# Patient Record
Sex: Male | Born: 1950 | Race: White | Hispanic: No | Marital: Single | Smoking: Former smoker
Health system: Southern US, Community
[De-identification: ages and names within clinical notes are randomized; demographics above are authoritative.]

## PROBLEM LIST (undated history)

## (undated) DIAGNOSIS — Z9889 Other specified postprocedural states: Secondary | ICD-10-CM

## (undated) DIAGNOSIS — T7840XA Allergy, unspecified, initial encounter: Secondary | ICD-10-CM

## (undated) DIAGNOSIS — Z21 Asymptomatic human immunodeficiency virus [HIV] infection status: Secondary | ICD-10-CM

## (undated) DIAGNOSIS — E785 Hyperlipidemia, unspecified: Secondary | ICD-10-CM

## (undated) DIAGNOSIS — C801 Malignant (primary) neoplasm, unspecified: Secondary | ICD-10-CM

## (undated) DIAGNOSIS — M1990 Unspecified osteoarthritis, unspecified site: Secondary | ICD-10-CM

## (undated) DIAGNOSIS — Z87891 Personal history of nicotine dependence: Secondary | ICD-10-CM

## (undated) DIAGNOSIS — B20 Human immunodeficiency virus [HIV] disease: Secondary | ICD-10-CM

## (undated) DIAGNOSIS — Z8719 Personal history of other diseases of the digestive system: Secondary | ICD-10-CM

## (undated) DIAGNOSIS — H53039 Strabismic amblyopia, unspecified eye: Secondary | ICD-10-CM

## (undated) DIAGNOSIS — I10 Essential (primary) hypertension: Secondary | ICD-10-CM

## (undated) HISTORY — DX: Other specified postprocedural states: Z98.890

## (undated) HISTORY — DX: Human immunodeficiency virus (HIV) disease: B20

## (undated) HISTORY — DX: Allergy, unspecified, initial encounter: T78.40XA

## (undated) HISTORY — PX: HERNIA REPAIR: SHX51

## (undated) HISTORY — DX: Personal history of nicotine dependence: Z87.891

## (undated) HISTORY — DX: Personal history of other diseases of the digestive system: Z87.19

## (undated) HISTORY — PX: EYE SURGERY: SHX253

## (undated) HISTORY — DX: Unspecified osteoarthritis, unspecified site: M19.90

## (undated) HISTORY — DX: Asymptomatic human immunodeficiency virus (hiv) infection status: Z21

## (undated) HISTORY — DX: Strabismic amblyopia, unspecified eye: H53.039

## (undated) HISTORY — DX: Essential (primary) hypertension: I10

## (undated) HISTORY — DX: Hyperlipidemia, unspecified: E78.5

---

## 2001-04-26 DIAGNOSIS — B20 Human immunodeficiency virus [HIV] disease: Secondary | ICD-10-CM

## 2003-08-04 ENCOUNTER — Encounter (INDEPENDENT_AMBULATORY_CARE_PROVIDER_SITE_OTHER): Payer: Self-pay | Admitting: *Deleted

## 2003-08-04 ENCOUNTER — Encounter: Admission: RE | Admit: 2003-08-04 | Discharge: 2003-08-04 | Payer: Self-pay | Admitting: Internal Medicine

## 2003-08-04 ENCOUNTER — Ambulatory Visit (HOSPITAL_COMMUNITY): Admission: RE | Admit: 2003-08-04 | Discharge: 2003-08-04 | Payer: Self-pay | Admitting: Internal Medicine

## 2003-08-04 LAB — CONVERTED CEMR LAB
CD4 Count: 530 microliters
CD4 T Cell Abs: 530

## 2003-08-18 ENCOUNTER — Encounter: Admission: RE | Admit: 2003-08-18 | Discharge: 2003-08-18 | Payer: Self-pay | Admitting: Infectious Diseases

## 2003-09-02 ENCOUNTER — Encounter: Admission: RE | Admit: 2003-09-02 | Discharge: 2003-09-02 | Payer: Self-pay | Admitting: Infectious Diseases

## 2003-09-02 ENCOUNTER — Ambulatory Visit (HOSPITAL_COMMUNITY): Admission: RE | Admit: 2003-09-02 | Discharge: 2003-09-02 | Payer: Self-pay | Admitting: Infectious Diseases

## 2003-09-16 ENCOUNTER — Encounter: Admission: RE | Admit: 2003-09-16 | Discharge: 2003-09-16 | Payer: Self-pay | Admitting: Infectious Diseases

## 2003-12-23 ENCOUNTER — Encounter: Admission: RE | Admit: 2003-12-23 | Discharge: 2003-12-23 | Payer: Self-pay | Admitting: Infectious Diseases

## 2003-12-23 ENCOUNTER — Ambulatory Visit (HOSPITAL_COMMUNITY): Admission: RE | Admit: 2003-12-23 | Discharge: 2003-12-23 | Payer: Self-pay | Admitting: Infectious Diseases

## 2004-01-06 ENCOUNTER — Encounter: Admission: RE | Admit: 2004-01-06 | Discharge: 2004-01-06 | Payer: Self-pay | Admitting: Infectious Diseases

## 2004-04-25 ENCOUNTER — Ambulatory Visit: Payer: Self-pay | Admitting: Infectious Diseases

## 2004-04-25 ENCOUNTER — Ambulatory Visit (HOSPITAL_COMMUNITY): Admission: RE | Admit: 2004-04-25 | Discharge: 2004-04-25 | Payer: Self-pay | Admitting: Infectious Diseases

## 2004-05-09 ENCOUNTER — Ambulatory Visit: Payer: Self-pay | Admitting: Infectious Diseases

## 2004-09-07 ENCOUNTER — Ambulatory Visit: Payer: Self-pay | Admitting: Infectious Diseases

## 2004-09-07 ENCOUNTER — Ambulatory Visit (HOSPITAL_COMMUNITY): Admission: RE | Admit: 2004-09-07 | Discharge: 2004-09-07 | Payer: Self-pay | Admitting: Infectious Diseases

## 2004-09-21 ENCOUNTER — Ambulatory Visit: Payer: Self-pay | Admitting: Infectious Diseases

## 2005-02-06 ENCOUNTER — Ambulatory Visit: Payer: Self-pay | Admitting: Infectious Diseases

## 2005-02-06 ENCOUNTER — Ambulatory Visit (HOSPITAL_COMMUNITY): Admission: RE | Admit: 2005-02-06 | Discharge: 2005-02-06 | Payer: Self-pay | Admitting: Infectious Diseases

## 2005-03-01 ENCOUNTER — Ambulatory Visit: Payer: Self-pay | Admitting: Infectious Diseases

## 2005-07-19 ENCOUNTER — Ambulatory Visit: Payer: Self-pay | Admitting: Infectious Diseases

## 2005-07-19 ENCOUNTER — Encounter (INDEPENDENT_AMBULATORY_CARE_PROVIDER_SITE_OTHER): Payer: Self-pay | Admitting: *Deleted

## 2005-07-19 LAB — CONVERTED CEMR LAB: CD4 Count: 650 microliters

## 2005-08-02 ENCOUNTER — Ambulatory Visit: Payer: Self-pay | Admitting: Infectious Diseases

## 2006-01-03 ENCOUNTER — Ambulatory Visit: Payer: Self-pay | Admitting: Infectious Diseases

## 2006-01-03 ENCOUNTER — Encounter: Admission: RE | Admit: 2006-01-03 | Discharge: 2006-01-03 | Payer: Self-pay | Admitting: Infectious Diseases

## 2006-01-03 ENCOUNTER — Encounter (INDEPENDENT_AMBULATORY_CARE_PROVIDER_SITE_OTHER): Payer: Self-pay | Admitting: *Deleted

## 2006-01-03 LAB — CONVERTED CEMR LAB
CD4 Count: 620 microliters
HIV 1 RNA Quant: 399 copies/mL

## 2006-01-17 ENCOUNTER — Ambulatory Visit: Payer: Self-pay | Admitting: Infectious Diseases

## 2006-07-03 ENCOUNTER — Encounter: Admission: RE | Admit: 2006-07-03 | Discharge: 2006-07-03 | Payer: Self-pay | Admitting: Infectious Diseases

## 2006-07-03 ENCOUNTER — Ambulatory Visit: Payer: Self-pay | Admitting: Infectious Diseases

## 2006-07-03 ENCOUNTER — Encounter (INDEPENDENT_AMBULATORY_CARE_PROVIDER_SITE_OTHER): Payer: Self-pay | Admitting: *Deleted

## 2006-07-03 LAB — CONVERTED CEMR LAB
ALT: 21 units/L (ref 0–53)
AST: 19 units/L (ref 0–37)
Albumin: 4.6 g/dL (ref 3.5–5.2)
Alkaline Phosphatase: 82 units/L (ref 39–117)
Basophils Absolute: 0 10*3/uL (ref 0.0–0.1)
Eosinophils Relative: 4 % (ref 0–5)
Glucose, Bld: 102 mg/dL — ABNORMAL HIGH (ref 70–99)
HCT: 45.6 % (ref 39.0–52.0)
HIV 1 RNA Quant: 136 copies/mL — ABNORMAL HIGH (ref <50)
HIV-1 RNA Quant, Log: 2.13 — ABNORMAL HIGH (ref <1.70)
LDL Cholesterol: 89 mg/dL (ref 0–99)
Lymphocytes Relative: 34 % (ref 12–46)
MCHC: 33.3 g/dL (ref 30.0–36.0)
Platelets: 192 10*3/uL (ref 150–400)
Potassium: 4.4 meq/L (ref 3.5–5.3)
RDW: 13.4 % (ref 11.5–14.0)
Sodium: 141 meq/L (ref 135–145)
Total Bilirubin: 0.5 mg/dL (ref 0.3–1.2)
Total Protein: 6.9 g/dL (ref 6.0–8.3)
Triglycerides: 377 mg/dL — ABNORMAL HIGH (ref <150)
VLDL: 75 mg/dL — ABNORMAL HIGH (ref 0–40)

## 2006-07-18 ENCOUNTER — Ambulatory Visit: Payer: Self-pay | Admitting: Infectious Diseases

## 2006-07-18 DIAGNOSIS — E785 Hyperlipidemia, unspecified: Secondary | ICD-10-CM

## 2006-07-18 DIAGNOSIS — H53039 Strabismic amblyopia, unspecified eye: Secondary | ICD-10-CM | POA: Insufficient documentation

## 2006-07-18 DIAGNOSIS — Z87898 Personal history of other specified conditions: Secondary | ICD-10-CM | POA: Insufficient documentation

## 2006-08-20 ENCOUNTER — Encounter (INDEPENDENT_AMBULATORY_CARE_PROVIDER_SITE_OTHER): Payer: Self-pay | Admitting: *Deleted

## 2006-08-20 LAB — CONVERTED CEMR LAB

## 2006-08-29 ENCOUNTER — Encounter (INDEPENDENT_AMBULATORY_CARE_PROVIDER_SITE_OTHER): Payer: Self-pay | Admitting: *Deleted

## 2006-09-02 ENCOUNTER — Encounter (INDEPENDENT_AMBULATORY_CARE_PROVIDER_SITE_OTHER): Payer: Self-pay | Admitting: *Deleted

## 2006-12-17 ENCOUNTER — Encounter: Admission: RE | Admit: 2006-12-17 | Discharge: 2006-12-17 | Payer: Self-pay | Admitting: Infectious Diseases

## 2006-12-17 ENCOUNTER — Ambulatory Visit: Payer: Self-pay | Admitting: Infectious Diseases

## 2006-12-17 LAB — CONVERTED CEMR LAB
ALT: 23 units/L (ref 0–35)
Albumin: 4.7 g/dL (ref 3.5–5.2)
Alkaline Phosphatase: 94 units/L (ref 39–117)
Basophils Absolute: 0 10*3/uL (ref 0.0–0.1)
CO2: 27 meq/L (ref 19–32)
Eosinophils Absolute: 0.1 10*3/uL (ref 0.0–0.7)
Eosinophils Relative: 3 % (ref 0–5)
Glucose, Bld: 107 mg/dL — ABNORMAL HIGH (ref 70–99)
HIV-1 RNA Quant, Log: 2.69 — ABNORMAL HIGH (ref <1.70)
Lymphocytes Relative: 34 % (ref 12–46)
Lymphs Abs: 1.6 10*3/uL (ref 0.7–3.3)
Neutrophils Relative %: 54 % (ref 43–77)
Platelets: 188 10*3/uL (ref 150–400)
Potassium: 4.3 meq/L (ref 3.5–5.3)
RDW: 13.1 % (ref 11.5–14.0)
Sodium: 140 meq/L (ref 135–145)
Total Bilirubin: 0.4 mg/dL (ref 0.3–1.2)
Total Protein: 7 g/dL (ref 6.0–8.3)
WBC: 4.7 10*3/uL (ref 4.0–10.5)

## 2006-12-18 ENCOUNTER — Encounter: Payer: Self-pay | Admitting: Infectious Diseases

## 2006-12-31 ENCOUNTER — Ambulatory Visit: Payer: Self-pay | Admitting: Infectious Diseases

## 2006-12-31 DIAGNOSIS — I10 Essential (primary) hypertension: Secondary | ICD-10-CM

## 2007-01-01 ENCOUNTER — Encounter (INDEPENDENT_AMBULATORY_CARE_PROVIDER_SITE_OTHER): Payer: Self-pay | Admitting: *Deleted

## 2007-01-11 ENCOUNTER — Encounter (INDEPENDENT_AMBULATORY_CARE_PROVIDER_SITE_OTHER): Payer: Self-pay | Admitting: *Deleted

## 2007-03-20 ENCOUNTER — Telehealth: Payer: Self-pay | Admitting: Infectious Diseases

## 2007-04-16 ENCOUNTER — Ambulatory Visit: Payer: Self-pay | Admitting: Infectious Diseases

## 2007-04-16 ENCOUNTER — Encounter: Admission: RE | Admit: 2007-04-16 | Discharge: 2007-04-16 | Payer: Self-pay | Admitting: Infectious Diseases

## 2007-04-16 LAB — CONVERTED CEMR LAB
ALT: 18 units/L (ref 0–53)
AST: 17 units/L (ref 0–37)
Alkaline Phosphatase: 82 units/L (ref 39–117)
BUN: 11 mg/dL (ref 6–23)
Basophils Absolute: 0 10*3/uL (ref 0.0–0.1)
Basophils Relative: 1 % (ref 0–1)
Calcium: 9.1 mg/dL (ref 8.4–10.5)
Creatinine, Ser: 0.88 mg/dL (ref 0.40–1.50)
Eosinophils Absolute: 0.2 10*3/uL (ref 0.0–0.7)
Eosinophils Relative: 3 % (ref 0–5)
Hemoglobin: 15.4 g/dL (ref 13.0–17.0)
MCHC: 34.8 g/dL (ref 30.0–36.0)
MCV: 90 fL (ref 78.0–100.0)
Monocytes Absolute: 0.6 10*3/uL (ref 0.2–0.7)
Monocytes Relative: 11 % (ref 3–11)
RBC: 4.92 M/uL (ref 4.22–5.81)
RDW: 13.3 % (ref 11.5–14.0)
Total Bilirubin: 0.5 mg/dL (ref 0.3–1.2)

## 2007-05-01 ENCOUNTER — Ambulatory Visit: Payer: Self-pay | Admitting: Infectious Diseases

## 2007-05-01 LAB — CONVERTED CEMR LAB
Cholesterol: 183 mg/dL (ref 0–200)
HDL: 33 mg/dL — ABNORMAL LOW (ref >39)
Total CHOL/HDL Ratio: 5.5
Triglycerides: 502 mg/dL — ABNORMAL HIGH (ref <150)

## 2007-06-24 ENCOUNTER — Encounter (INDEPENDENT_AMBULATORY_CARE_PROVIDER_SITE_OTHER): Payer: Self-pay | Admitting: *Deleted

## 2007-06-25 ENCOUNTER — Encounter (INDEPENDENT_AMBULATORY_CARE_PROVIDER_SITE_OTHER): Payer: Self-pay | Admitting: *Deleted

## 2007-06-25 ENCOUNTER — Encounter: Payer: Self-pay | Admitting: Infectious Diseases

## 2007-08-29 ENCOUNTER — Encounter (INDEPENDENT_AMBULATORY_CARE_PROVIDER_SITE_OTHER): Payer: Self-pay | Admitting: *Deleted

## 2007-09-13 ENCOUNTER — Encounter: Payer: Self-pay | Admitting: Infectious Diseases

## 2007-09-16 ENCOUNTER — Encounter: Admission: RE | Admit: 2007-09-16 | Discharge: 2007-09-16 | Payer: Self-pay | Admitting: Infectious Diseases

## 2007-09-16 ENCOUNTER — Ambulatory Visit: Payer: Self-pay | Admitting: Infectious Diseases

## 2007-09-16 LAB — CONVERTED CEMR LAB
AST: 21 units/L (ref 0–37)
Alkaline Phosphatase: 83 units/L (ref 39–117)
BUN: 11 mg/dL (ref 6–23)
Basophils Absolute: 0 10*3/uL (ref 0.0–0.1)
Basophils Relative: 0 % (ref 0–1)
Creatinine, Ser: 0.88 mg/dL (ref 0.40–1.50)
Eosinophils Absolute: 0.2 10*3/uL (ref 0.0–0.7)
HDL: 37 mg/dL — ABNORMAL LOW (ref >39)
LDL Cholesterol: 93 mg/dL (ref 0–99)
MCHC: 34.2 g/dL (ref 30.0–36.0)
MCV: 91.1 fL (ref 78.0–100.0)
Monocytes Relative: 10 % (ref 3–12)
Neutrophils Relative %: 48 % (ref 43–77)
Potassium: 4.3 meq/L (ref 3.5–5.3)
RBC: 4.85 M/uL (ref 4.22–5.81)
RDW: 13 % (ref 11.5–15.5)
Total CHOL/HDL Ratio: 5.1

## 2007-09-26 ENCOUNTER — Encounter (INDEPENDENT_AMBULATORY_CARE_PROVIDER_SITE_OTHER): Payer: Self-pay | Admitting: *Deleted

## 2007-09-30 ENCOUNTER — Ambulatory Visit: Payer: Self-pay | Admitting: Infectious Diseases

## 2008-03-16 ENCOUNTER — Ambulatory Visit: Payer: Self-pay | Admitting: Infectious Diseases

## 2008-03-16 LAB — CONVERTED CEMR LAB
ALT: 20 units/L (ref 0–53)
AST: 22 units/L (ref 0–37)
Cholesterol: 186 mg/dL (ref 0–200)
Creatinine, Ser: 0.82 mg/dL (ref 0.40–1.50)
HCT: 46.4 % (ref 39.0–52.0)
HIV 1 RNA Quant: 151 copies/mL — ABNORMAL HIGH (ref <50)
HIV-1 RNA Quant, Log: 2.18 — ABNORMAL HIGH (ref <1.70)
MCHC: 33.6 g/dL (ref 30.0–36.0)
MCV: 90.4 fL (ref 78.0–100.0)
Platelets: 198 10*3/uL (ref 150–400)
RDW: 12.7 % (ref 11.5–15.5)
Total Bilirubin: 0.5 mg/dL (ref 0.3–1.2)
Total CHOL/HDL Ratio: 5
VLDL: 52 mg/dL — ABNORMAL HIGH (ref 0–40)

## 2008-03-30 ENCOUNTER — Ambulatory Visit: Payer: Self-pay | Admitting: Infectious Diseases

## 2008-08-21 ENCOUNTER — Encounter (INDEPENDENT_AMBULATORY_CARE_PROVIDER_SITE_OTHER): Payer: Self-pay | Admitting: *Deleted

## 2008-09-16 ENCOUNTER — Ambulatory Visit: Payer: Self-pay | Admitting: Infectious Diseases

## 2008-09-16 LAB — CONVERTED CEMR LAB
AST: 19 units/L (ref 0–37)
Albumin: 4.5 g/dL (ref 3.5–5.2)
Alkaline Phosphatase: 81 units/L (ref 39–117)
BUN: 11 mg/dL (ref 6–23)
Basophils Absolute: 0 10*3/uL (ref 0.0–0.1)
Basophils Relative: 0 % (ref 0–1)
Creatinine, Ser: 0.83 mg/dL (ref 0.40–1.50)
Eosinophils Relative: 2 % (ref 0–5)
HCT: 42.7 % (ref 39.0–52.0)
HDL: 35 mg/dL — ABNORMAL LOW (ref >39)
LDL Cholesterol: 75 mg/dL (ref 0–99)
Lymphocytes Relative: 30 % (ref 12–46)
Platelets: 191 10*3/uL (ref 150–400)
Potassium: 4.1 meq/L (ref 3.5–5.3)
RDW: 13 % (ref 11.5–15.5)
Total Bilirubin: 0.4 mg/dL (ref 0.3–1.2)
Total CHOL/HDL Ratio: 4.5
VLDL: 49 mg/dL — ABNORMAL HIGH (ref 0–40)

## 2008-09-30 ENCOUNTER — Ambulatory Visit: Payer: Self-pay | Admitting: Infectious Diseases

## 2008-10-28 ENCOUNTER — Encounter (INDEPENDENT_AMBULATORY_CARE_PROVIDER_SITE_OTHER): Payer: Self-pay | Admitting: *Deleted

## 2009-03-10 ENCOUNTER — Ambulatory Visit: Payer: Self-pay | Admitting: Infectious Diseases

## 2009-03-10 LAB — CONVERTED CEMR LAB
ALT: 21 units/L (ref 0–53)
Albumin: 4.3 g/dL (ref 3.5–5.2)
Basophils Relative: 1 % (ref 0–1)
CO2: 24 meq/L (ref 19–32)
Cholesterol: 161 mg/dL (ref 0–200)
Eosinophils Absolute: 0.1 10*3/uL (ref 0.0–0.7)
Glucose, Bld: 94 mg/dL (ref 70–99)
HIV 1 RNA Quant: 62 copies/mL — ABNORMAL HIGH (ref <48)
HIV-1 RNA Quant, Log: 1.79 — ABNORMAL HIGH (ref <1.68)
Lymphs Abs: 1.7 10*3/uL (ref 0.7–4.0)
MCHC: 34.2 g/dL (ref 30.0–36.0)
MCV: 91 fL (ref >=78.0)
Neutro Abs: 2.9 10*3/uL (ref 1.7–7.7)
Neutrophils Relative %: 56 % (ref 43–77)
Platelets: 193 10*3/uL (ref 150–400)
Potassium: 4.3 meq/L (ref 3.5–5.3)
RBC: 4.79 M/uL (ref 4.22–5.81)
Sodium: 138 meq/L (ref 135–145)
Total Bilirubin: 0.4 mg/dL (ref 0.3–1.2)
Total Protein: 6.4 g/dL (ref 6.0–8.3)
WBC: 5.2 10*3/uL (ref 4.0–10.5)

## 2009-03-24 ENCOUNTER — Ambulatory Visit: Payer: Self-pay | Admitting: Infectious Diseases

## 2009-08-19 ENCOUNTER — Encounter (INDEPENDENT_AMBULATORY_CARE_PROVIDER_SITE_OTHER): Payer: Self-pay | Admitting: *Deleted

## 2009-09-08 ENCOUNTER — Ambulatory Visit: Payer: Self-pay | Admitting: Infectious Diseases

## 2009-09-08 LAB — CONVERTED CEMR LAB
Albumin: 4.5 g/dL (ref 3.5–5.2)
Alkaline Phosphatase: 73 units/L (ref 39–117)
CO2: 27 meq/L (ref 19–32)
Calcium: 9 mg/dL (ref 8.4–10.5)
Chloride: 104 meq/L (ref 96–112)
Cholesterol: 174 mg/dL (ref 0–200)
Glucose, Bld: 96 mg/dL (ref 70–99)
HCT: 44.3 % (ref 39.0–52.0)
HIV-1 RNA Quant, Log: 2.61 — ABNORMAL HIGH (ref <1.68)
Hemoglobin: 14.9 g/dL (ref 13.0–17.0)
LDL Cholesterol: 75 mg/dL (ref 0–99)
Lymphocytes Relative: 38 % (ref 12–46)
Lymphs Abs: 2.1 10*3/uL (ref 0.7–4.0)
Monocytes Absolute: 0.4 10*3/uL (ref 0.1–1.0)
Monocytes Relative: 7 % (ref 3–12)
Neutro Abs: 2.9 10*3/uL (ref 1.7–7.7)
Potassium: 4.3 meq/L (ref 3.5–5.3)
RBC: 4.91 M/uL (ref 4.22–5.81)
Sodium: 140 meq/L (ref 135–145)
Total Protein: 6.5 g/dL (ref 6.0–8.3)
Triglycerides: 329 mg/dL — ABNORMAL HIGH (ref <150)

## 2009-10-01 ENCOUNTER — Ambulatory Visit: Payer: Self-pay | Admitting: Infectious Diseases

## 2010-03-23 ENCOUNTER — Ambulatory Visit: Payer: Self-pay | Admitting: Infectious Diseases

## 2010-03-23 LAB — CONVERTED CEMR LAB
ALT: 29 U/L
AST: 28 U/L
Albumin: 4.3 g/dL
Alkaline Phosphatase: 76 U/L
BUN: 11 mg/dL
Basophils Absolute: 0 K/uL
Basophils Relative: 1 %
CO2: 25 meq/L
Calcium: 9 mg/dL
Chloride: 106 meq/L
Creatinine, Ser: 0.86 mg/dL
Eosinophils Absolute: 0.2 K/uL
Eosinophils Relative: 3 %
Glucose, Bld: 94 mg/dL
HCT: 43 %
HIV 1 RNA Quant: <20 copies/mL (ref <20)
HIV-1 RNA Quant, Log: <1.3 (ref <1.30)
Hemoglobin: 14.8 g/dL
Lymphocytes Relative: 32 %
Lymphs Abs: 1.9 K/uL
MCHC: 34.4 g/dL
MCV: 89.2 fL
Monocytes Absolute: 0.5 K/uL
Monocytes Relative: 9 %
Neutro Abs: 3.2 K/uL
Neutrophils Relative %: 55 %
Platelets: 201 K/uL
Potassium: 4.2 meq/L
RBC: 4.82 M/uL
RDW: 12.9 %
Sodium: 142 meq/L
Total Bilirubin: 0.3 mg/dL
Total Protein: 6.3 g/dL
WBC: 5.9 10*3/microliter

## 2010-04-06 ENCOUNTER — Ambulatory Visit: Payer: Self-pay | Admitting: Infectious Diseases

## 2010-04-22 ENCOUNTER — Telehealth (INDEPENDENT_AMBULATORY_CARE_PROVIDER_SITE_OTHER): Payer: Self-pay | Admitting: *Deleted

## 2010-07-26 NOTE — Assessment & Plan Note (Signed)
Summary: 61month f/u [mkj]   CC:  6 month follow up.  History of Present Illness: 60 yo M with HIV+ and hyperlipidemia. CD4 730-->650, VL <20  (03-23-2010). Prev Trig 329. no complaints. no problems with ART.   Preventive Screening-Counseling & Management  Alcohol-Tobacco     Alcohol drinks/day: 0     Smoking Status: never     Year Quit: 10 years ago  Caffeine-Diet-Exercise     Caffeine use/day: 3cups coffee am  2 pm/soda     Does Patient Exercise: not really  Safety-Violence-Falls     Seat Belt Use: yes   Updated Prior Medication List: ATRIPLA 600-200-300 MG TABS (EFAVIRENZ-EMTRICITAB-TENOFOVIR) one by mouth at bedtime  Current Allergies (reviewed today): No known allergies  Past History:  Past medical, surgical, family and social histories (including risk factors) reviewed, and no changes noted (except as noted below).  Past Medical History: Reviewed history from 07/18/2006 and no changes required. HIV disease 04/26/2001 Hyperlipidemia  Family History: Reviewed history from 09/30/2007 and no changes required. denies.   Social History: Reviewed history from 09/30/2007 and no changes required. Former Smoker- qut 10 years ago.  Alcohol use-yes- every day. 2-3 beers.   Review of Systems       wt down 5#,   Vital Signs:  Patient profile:   60 year old male Height:      70 inches (177.80 cm) Weight:      218.0 pounds (99.09 kg) BMI:     31.39 Temp:     98.6 degrees F (37.00 degrees C) oral Pulse rate:   73 / minute BP sitting:   143 / 95  (right arm)  Vitals Entered By: Baxter Hire) (April 06, 2010 10:23 AM) CC: 6 month follow up Pain Assessment Patient in pain? no      Nutritional Status BMI of > 30 = obese Nutritional Status Detail appetite is good per patient  Have you ever been in a relationship where you felt threatened, hurt or afraid?No   Does patient need assistance? Functional Status Self care Ambulation Normal   Physical  Exam  General:  well-developed, well-nourished, and well-hydrated.   Eyes:  pupils equal, pupils round, and pupils reactive to light.   Mouth:  pharynx pink and moist and no exudates.   Neck:  no masses.   Lungs:  normal respiratory effort and normal breath sounds.   Heart:  normal rate, regular rhythm, and no murmur.  normal rate, regular rhythm, and no murmur.   Abdomen:  soft, non-tender, and normal bowel sounds.  soft, non-tender, and normal bowel sounds.          Medication Adherence: 04/06/2010   Adherence to medications reviewed with patient. Counseling to provide adequate adherence provided   Prevention For Positives: 04/06/2010   Safe sex practices discussed with patient. Condoms offered.                             Impression & Recommendations:  Problem # 1:  HIV DISEASE (ICD-042) he is doing very well. will cont his currnet meds. offered condoms. flu shot today.states he has finnished his Hep A/B series.  offered colonoscopy, pt defers. return to clinic 5-6 months.   Problem # 2:  HYPERLIPIDEMIA (ICD-272.4) counseled to watch w and diet. exercise (encouraged)  Other Orders: Influenza Vaccine NON MCR (04540) Est. Patient Level IV (98119) Future Orders: T-CD4SP ([facility]) (CD4SP) ... 10/03/2010 T-HIV Viral Load [phone]) .Marland KitchenMarland Kitchen  10/03/2010 T-Comprehensive Metabolic Panel [phone]) ... 10/03/2010 T-CBC w/Diff ([id number]) ... 10/03/2010 T-RPR (Syphilis) [phone]) ... 10/03/2010 T-Lipid Profile [phone]) ... 10/03/2010    Influenza Vaccine    Vaccine Type: Fluvax Non-MCR    Site: right deltoid    Mfr: novartis    Dose: 0.5 ml    Route: IM    Given by: Jennet Maduro RN    Exp. Date: 09/25/2010    Lot #: [id number]  Flu Vaccine Consent Questions    Do you have a history of severe allergic reactions to this vaccine? no    Any prior history of allergic reactions to egg and/or gelatin? no    Do you have a sensitivity to the preservative  Thimersol? no    Do you have a past history of Guillan-Barre Syndrome? no    Do you currently have an acute febrile illness? no    Have you ever had a severe reaction to latex? no    Vaccine information given and explained to patient? yes

## 2010-07-26 NOTE — Assessment & Plan Note (Signed)
Summary: f/u/cfb   CC:  follow-up visit.  History of Present Illness: 60 yo M with HIV+ and hyperlipidemia. CD4 730, VL 411  (09-08-2009).  Trig 329. no complaints. no problems with ART.   Preventive Screening-Counseling & Management  Alcohol-Tobacco     Alcohol drinks/day: 0     Smoking Status: never     Year Quit: 10 years ago  Caffeine-Diet-Exercise     Caffeine use/day: 3cups coffee am  2 pm/soda     Does Patient Exercise: not really  Safety-Violence-Falls     Seat Belt Use: yes   Updated Prior Medication List: ATRIPLA 600-200-300 MG TABS (EFAVIRENZ-EMTRICITAB-TENOFOVIR) one by mouth at bedtime  Current Allergies (reviewed today): No known allergies  Past History:  Past medical, surgical, family and social histories (including risk factors) reviewed, and no changes noted (except as noted below).  Past Medical History: Reviewed history from 07/18/2006 and no changes required. HIV disease 04/26/2001 Hyperlipidemia  Family History: Reviewed history from 09/30/2007 and no changes required. denies  Social History: Reviewed history from 09/30/2007 and no changes required. Former Smoker- qut 10 years ago.  Alcohol use-yes- every day. 2-3 beers.   Review of Systems       eating "too much", moving bowels well, nl urination, wt up 7#  Vital Signs:  Patient profile:   60 year old male Height:      70 inches (177.80 cm) Weight:      223.8 pounds (101.73 kg) BMI:     32.23 Temp:     97.6 degrees F (36.44 degrees C) oral Pulse rate:   67 / minute BP sitting:   141 / 93  (left arm)  Vitals Entered By: Baxter Hire) (October 01, 2009 9:57 AM)  Current Medications (verified): 1)  Atripla 600-200-300 Mg Tabs (Efavirenz-Emtricitab-Tenofovir) .... One By Mouth At Bedtime  Allergies (verified): No Known Drug Allergies  CC: follow-up visit Is Patient Diabetic? No Pain Assessment Patient in pain? no      Nutritional Status BMI of > 30 = obese Nutritional  Status Detail appetite is better than ever per patient  Have you ever been in a relationship where you felt threatened, hurt or afraid?No   Does patient need assistance? Functional Status Self care Ambulation Normal        Medication Adherence: 10/01/2009   Adherence to medications reviewed with patient. Counseling to provide adequate adherence provided   Prevention For Positives: 10/01/2009   Safe sex practices discussed with patient. Condoms offered.                             Physical Exam  General:  well-developed, well-nourished, and well-hydrated.   Eyes:  pupils equal, pupils round, and pupils reactive to light.   Mouth:  pharynx pink and moist and no exudates.   Neck:  no masses.   Lungs:  normal respiratory effort and normal breath sounds.   Heart:  normal rate, regular rhythm, and no murmur.   Abdomen:  soft, non-tender, and normal bowel sounds.     Impression & Recommendations:  Problem # 1:  HIV DISEASE (ICD-042) he is doing well, some detectable virus. offered condoms. offered colonoscopy (he declined). offer 2nd hep A (declined, said he had previously). meeting med assistance personel this AM.  return to clinic 6 months with labs prior.   Problem # 2:  HYPERLIPIDEMIA (ICD-272.4) aksed him to watch his diet. he has had NL trig prev.  will cont to follow.   Other Orders: Est. Patient Level IV (95621) Future Orders: T-CD4SP ([facility]) (CD4SP) ... 12/30/2009 T-HIV Viral Load [id number]) ... 12/30/2009 T-Comprehensive Metabolic Panel [id number]) ... 12/30/2009 T-CBC w/Diff ([id number]) ... 12/30/2009  Process Orders Check Orders Results:     [facility]: ABN not required for this insurance Tests Sent for requisitioning (October 01, 2009 10:47 AM):     12/30/2009: [facility] -- T-HIV Viral Load [phone] (signed)     12/30/2009: [facility] -- T-Comprehensive Metabolic Panel [[id number]] (signed)      12/30/2009: [facility] -- [facility] w/Diff [[id number]] (signed)

## 2010-07-26 NOTE — Progress Notes (Signed)
Summary: faxed back Atripla PAP form to [facility]  Phone Note Other Incoming   Caller: [facility] Atripla [facility]  - [phone], fax [phone] Reason for Call: Get patient information Summary of Call: Form faxed from [facility] Atripla PAP.  Completed form, attached most recent ADAP denial (due to over income) and faxed back.  Form placed in pt. RED folder in file cabinet. Jennet Maduro RN  April 22, 2010 11:40 AM

## 2010-07-26 NOTE — Miscellaneous (Signed)
Summary: clinical update/ryanwhite [place] app completed  Clinical Lists Changes  Observations: Added new observation of YEARLYEXPEN: 82  (08/19/2009 13:50) Added new observation of PCTFPL: 180.42  (08/19/2009 13:50) Added new observation of HOUSEINCOME: 16109  (08/19/2009 13:50) Added new observation of FINASSESSDT: 08/16/2009  (08/19/2009 13:50)     Paulo Fruit  BS,CPht II,MPH  August 19, 2009 1:51 PM

## 2010-08-13 ENCOUNTER — Encounter (INDEPENDENT_AMBULATORY_CARE_PROVIDER_SITE_OTHER): Payer: Self-pay | Admitting: *Deleted

## 2010-08-17 NOTE — Miscellaneous (Signed)
  Clinical Lists Changes  Observations: Added new observation of AIDSDAP: No-pt asst.  (08/13/2010 12:30)

## 2010-09-07 ENCOUNTER — Encounter (INDEPENDENT_AMBULATORY_CARE_PROVIDER_SITE_OTHER): Payer: Self-pay | Admitting: *Deleted

## 2010-09-13 NOTE — Miscellaneous (Signed)
Summary: adap update   Clinical Lists Changes  Observations: Added new observation of YEARLYEXPEN: 0  (09/07/2010 17:16) Added new observation of PCTFPL: 151.83  (09/07/2010 17:16) Added new observation of AIDSDAP: Pending-approval 2012  (09/07/2010 17:16) Added new observation of INCOMESOURCE: pension  retirement  (09/07/2010 17:16) Added new observation of HOUSEINCOME: 16109  (09/07/2010 17:16) Added new observation of FINASSESSDT: 09/07/2010  (09/07/2010 17:16)

## 2010-09-19 ENCOUNTER — Encounter (INDEPENDENT_AMBULATORY_CARE_PROVIDER_SITE_OTHER): Payer: Self-pay | Admitting: *Deleted

## 2010-09-21 ENCOUNTER — Other Ambulatory Visit: Payer: Self-pay

## 2010-09-21 DIAGNOSIS — B20 Human immunodeficiency virus [HIV] disease: Secondary | ICD-10-CM

## 2010-09-21 LAB — COMPLETE METABOLIC PANEL WITH GFR
ALT: 22 U/L (ref 0–53)
AST: 21 U/L (ref 0–37)
Alkaline Phosphatase: 83 U/L (ref 39–117)
Sodium: 141 mEq/L (ref 135–145)
Total Bilirubin: 0.5 mg/dL (ref 0.3–1.2)
Total Protein: 6.5 g/dL (ref 6.0–8.3)

## 2010-09-21 LAB — CBC WITH DIFFERENTIAL/PLATELET
Basophils Relative: 1 % (ref 0–1)
Eosinophils Absolute: 0.2 10*3/uL (ref 0.0–0.7)
HCT: 43.3 % (ref 39.0–52.0)
Hemoglobin: 15.2 g/dL (ref 13.0–17.0)
MCH: 31 pg (ref 26.0–34.0)
MCHC: 35.1 g/dL (ref 30.0–36.0)
Monocytes Absolute: 0.5 10*3/uL (ref 0.1–1.0)
Monocytes Relative: 8 % (ref 3–12)

## 2010-09-21 LAB — LIPID PANEL
HDL: 30 mg/dL — ABNORMAL LOW (ref >39)
Total CHOL/HDL Ratio: 5 Ratio
Triglycerides: 426 mg/dL — ABNORMAL HIGH (ref <150)

## 2010-09-23 ENCOUNTER — Telehealth: Payer: Self-pay | Admitting: Infectious Diseases

## 2010-09-23 LAB — HIV-1 RNA QUANT-NO REFLEX-BLD
HIV 1 RNA Quant: <20 copies/mL (ref <20)
HIV-1 RNA Quant, Log: <1.3 {Log} (ref <1.30)

## 2010-09-23 NOTE — Telephone Encounter (Signed)
Brett Canales from Atripla Patient Assistance called to let us know that it was time for [name] to re-enroll.  I called back to let them know that he has been approved for ADAP and that he will be getting his Atripla through ADAP.Marland KitchenMarland KitchenAntionette Fairy

## 2010-09-27 NOTE — Miscellaneous (Signed)
  Clinical Lists Changes  Observations: Added new observation of AIDSDAP: Yes 2012 (09/19/2010 12:16)

## 2010-09-30 LAB — T-HELPER CELL (CD4) - (RCID CLINIC ONLY): CD4 % Helper T Cell: 38 % (ref 33–55)

## 2010-10-05 ENCOUNTER — Ambulatory Visit (INDEPENDENT_AMBULATORY_CARE_PROVIDER_SITE_OTHER): Payer: Self-pay | Admitting: Infectious Diseases

## 2010-10-05 ENCOUNTER — Encounter: Payer: Self-pay | Admitting: Infectious Diseases

## 2010-10-05 VITALS — BP 145/92 | HR 77 | Temp 97.9°F | Ht 70.0 in | Wt 222.4 lb

## 2010-10-05 DIAGNOSIS — B20 Human immunodeficiency virus [HIV] disease: Secondary | ICD-10-CM

## 2010-10-05 NOTE — Assessment & Plan Note (Signed)
He is doing very well. Will cont him on his current rx's. Offered condoms. He will rtc in 5-6 months with labs prior.  I offered to schedule a colonoscopy for him for his routine screening. He left the clinic promptly at the mention of this.

## 2010-10-05 NOTE — Progress Notes (Signed)
  Subjective:    Patient ID: Patrick [name], male    DOB: 05/15/1951, 60 y.o.   MRN: [id number]  HPI 60 yo M with HIV+ and hyperlipidemia. CD4 700, VL <20  (09-21-2010). Trig 426. no complaints. no problems with ART. His wt is unchanged, he is aware that he needs to improve his diet.    Review of Systems     Objective:   Physical Exam  Constitutional: He appears well-developed and well-nourished.  Eyes: Pupils are equal, round, and reactive to light.  Neck: Neck supple.  Cardiovascular: Normal rate, regular rhythm and normal heart sounds.   Pulmonary/Chest: Effort normal and breath sounds normal.  Abdominal: Soft. Bowel sounds are normal.          Assessment & Plan:

## 2010-10-06 LAB — T-HELPER CELL (CD4) - (RCID CLINIC ONLY)
CD4 % Helper T Cell: 30 % — ABNORMAL LOW (ref 33–55)
CD4 T Cell Abs: 470 uL (ref 400–2700)

## 2010-10-19 ENCOUNTER — Other Ambulatory Visit: Payer: Self-pay | Admitting: *Deleted

## 2010-10-19 DIAGNOSIS — B20 Human immunodeficiency virus [HIV] disease: Secondary | ICD-10-CM

## 2010-10-19 MED ORDER — EFAVIRENZ-EMTRICITAB-TENOFOVIR 600-200-300 MG PO TABS: 1.0000 | ORAL_TABLET | Freq: Every day | ORAL | Status: DC

## 2011-01-10 ENCOUNTER — Ambulatory Visit: Payer: Self-pay

## 2011-03-29 ENCOUNTER — Other Ambulatory Visit (INDEPENDENT_AMBULATORY_CARE_PROVIDER_SITE_OTHER): Payer: Self-pay

## 2011-03-29 ENCOUNTER — Other Ambulatory Visit: Payer: Self-pay | Admitting: Infectious Diseases

## 2011-03-29 DIAGNOSIS — B20 Human immunodeficiency virus [HIV] disease: Secondary | ICD-10-CM

## 2011-03-29 LAB — COMPREHENSIVE METABOLIC PANEL
ALT: 28 U/L (ref 0–53)
BUN: 11 mg/dL (ref 6–23)
CO2: 27 mEq/L (ref 19–32)
Calcium: 9.2 mg/dL (ref 8.4–10.5)
Chloride: 104 mEq/L (ref 96–112)
Creat: 0.79 mg/dL (ref 0.50–1.35)
Glucose, Bld: 92 mg/dL (ref 70–99)

## 2011-03-29 LAB — CBC
HCT: 42.9 % (ref 39.0–52.0)
RBC: 4.84 MIL/uL (ref 4.22–5.81)
RDW: 12.8 % (ref 11.5–15.5)
WBC: 5.1 10*3/uL (ref 4.0–10.5)

## 2011-03-30 LAB — T-HELPER CELL (CD4) - (RCID CLINIC ONLY)
CD4 % Helper T Cell: 35 % (ref 33–55)
CD4 T Cell Abs: 630 uL (ref 400–2700)

## 2011-03-31 LAB — HIV-1 RNA QUANT-NO REFLEX-BLD: HIV-1 RNA Quant, Log: <1.3 {Log} (ref <1.30)

## 2011-04-05 LAB — T-HELPER CELL (CD4) - (RCID CLINIC ONLY): CD4 % Helper T Cell: 33

## 2011-04-12 ENCOUNTER — Ambulatory Visit: Payer: Self-pay

## 2011-04-12 ENCOUNTER — Ambulatory Visit (INDEPENDENT_AMBULATORY_CARE_PROVIDER_SITE_OTHER): Payer: Self-pay | Admitting: Infectious Diseases

## 2011-04-12 ENCOUNTER — Encounter: Payer: Self-pay | Admitting: Infectious Diseases

## 2011-04-12 VITALS — BP 155/94 | HR 78 | Temp 98.1°F | Ht 70.0 in | Wt 205.0 lb

## 2011-04-12 DIAGNOSIS — Z113 Encounter for screening for infections with a predominantly sexual mode of transmission: Secondary | ICD-10-CM

## 2011-04-12 DIAGNOSIS — B20 Human immunodeficiency virus [HIV] disease: Secondary | ICD-10-CM

## 2011-04-12 DIAGNOSIS — E785 Hyperlipidemia, unspecified: Secondary | ICD-10-CM

## 2011-04-12 DIAGNOSIS — Z23 Encounter for immunization: Secondary | ICD-10-CM

## 2011-04-12 DIAGNOSIS — I10 Essential (primary) hypertension: Secondary | ICD-10-CM

## 2011-04-12 LAB — T-HELPER CELL (CD4) - (RCID CLINIC ONLY): CD4 T Cell Abs: 670

## 2011-04-12 NOTE — Assessment & Plan Note (Signed)
Offered to start him on lopid or statin but he refuses. Will recheck at f/u visit.

## 2011-04-12 NOTE — Progress Notes (Signed)
  Subjective:    Patient ID: Patrick [name], male    DOB: 09/11/1950, 60 y.o.   MRN: [id number]  HPI 60 yo M with HIV+ on ART since 2002, and hyperlipidemia. CD4 630, VL <20 (06-28-2010). Prev Trig 426. Feeling well, taking medications well.      Review of Systems  Constitutional: Negative for fever, chills and unexpected weight change.  Respiratory: Negative for cough and shortness of breath.   Cardiovascular: Negative for chest pain.  Gastrointestinal: Negative for nausea and diarrhea.  Neurological: Negative for headaches (occsae sinus HA).       Objective:   Physical Exam  Constitutional: He appears well-developed and well-nourished.  Eyes: EOM are normal. Pupils are equal, round, and reactive to light.  Neck: Neck supple.  Cardiovascular: Normal rate, regular rhythm and normal heart sounds.   Pulmonary/Chest: Effort normal and breath sounds normal.  Abdominal: Soft. Bowel sounds are normal. There is no tenderness.          Assessment & Plan:

## 2011-04-12 NOTE — Assessment & Plan Note (Addendum)
He is offered rx but wishes to defer. He attributes to meeting with ADAP coordinator this AM. i asked him to have a BP recheck at drug store and call us with results.

## 2011-04-12 NOTE — Assessment & Plan Note (Addendum)
He is doing very well, offered condoms (refuses). Will recheck his Hep A Ab and Hep B SAb to see if he responded to his incomplete vax series. See him back in 5-6 months with labs prior.

## 2011-04-28 ENCOUNTER — Ambulatory Visit: Payer: Self-pay

## 2011-08-30 ENCOUNTER — Ambulatory Visit: Payer: Self-pay

## 2011-09-20 ENCOUNTER — Other Ambulatory Visit (INDEPENDENT_AMBULATORY_CARE_PROVIDER_SITE_OTHER): Payer: Self-pay

## 2011-09-20 DIAGNOSIS — B20 Human immunodeficiency virus [HIV] disease: Secondary | ICD-10-CM

## 2011-09-20 DIAGNOSIS — Z113 Encounter for screening for infections with a predominantly sexual mode of transmission: Secondary | ICD-10-CM

## 2011-09-20 DIAGNOSIS — E785 Hyperlipidemia, unspecified: Secondary | ICD-10-CM

## 2011-09-20 LAB — COMPREHENSIVE METABOLIC PANEL
AST: 29 U/L (ref 0–37)
Albumin: 4.3 g/dL (ref 3.5–5.2)
Alkaline Phosphatase: 87 U/L (ref 39–117)
BUN: 13 mg/dL (ref 6–23)
Creat: 0.81 mg/dL (ref 0.50–1.35)
Potassium: 4.3 mEq/L (ref 3.5–5.3)
Total Bilirubin: 0.6 mg/dL (ref 0.3–1.2)

## 2011-09-20 LAB — LIPID PANEL
HDL: 35 mg/dL — ABNORMAL LOW (ref >39)
LDL Cholesterol: 104 mg/dL — ABNORMAL HIGH (ref 0–99)
Total CHOL/HDL Ratio: 5.1 Ratio
VLDL: 39 mg/dL (ref 0–40)

## 2011-09-20 LAB — RPR

## 2011-09-20 LAB — CBC
HCT: 42 % (ref 39.0–52.0)
MCH: 30.7 pg (ref 26.0–34.0)
MCHC: 34.5 g/dL (ref 30.0–36.0)
RDW: 12.6 % (ref 11.5–15.5)

## 2011-09-22 LAB — HIV-1 RNA QUANT-NO REFLEX-BLD: HIV-1 RNA Quant, Log: <1.3 {Log} (ref <1.30)

## 2011-09-26 ENCOUNTER — Other Ambulatory Visit: Payer: Self-pay

## 2011-10-04 ENCOUNTER — Encounter: Payer: Self-pay | Admitting: Infectious Diseases

## 2011-10-04 ENCOUNTER — Ambulatory Visit (INDEPENDENT_AMBULATORY_CARE_PROVIDER_SITE_OTHER): Payer: Self-pay | Admitting: Infectious Diseases

## 2011-10-04 VITALS — BP 149/96 | HR 71 | Temp 98.6°F | Ht 70.0 in | Wt 212.1 lb

## 2011-10-04 DIAGNOSIS — I10 Essential (primary) hypertension: Secondary | ICD-10-CM

## 2011-10-04 DIAGNOSIS — B20 Human immunodeficiency virus [HIV] disease: Secondary | ICD-10-CM

## 2011-10-04 DIAGNOSIS — E785 Hyperlipidemia, unspecified: Secondary | ICD-10-CM

## 2011-10-04 NOTE — Assessment & Plan Note (Addendum)
Currently doing very well. Offered/refused condoms, pnvx. Refuses colonoscopy. See him back in 6 months (he wishes to come back in 1 year).

## 2011-10-04 NOTE — Assessment & Plan Note (Addendum)
Repeated and confirmed. Pt will monitor at his drug store, let us know if elevated. He has been reluctant to start new meds

## 2011-10-04 NOTE — Progress Notes (Signed)
  Subjective:    Patient ID: Patrick [name], male    DOB: Mar 09, 1951, 61 y.o.   MRN: [id number]  HPI 61 yo M with HIV+ on ART since 2002, and hyperlipidemia.  HIV 1 RNA Quant (copies/mL)  Date Value  09/20/2011 <20   03/29/2011 <20   09/21/2010 <20      CD4 T Cell Abs (cmm)  Date Value  09/20/2011 570   03/29/2011 630   09/21/2010 700     Lab Results  Component Value Date   CHOL 178 09/20/2011   HDL 35* 09/20/2011   LDLCALC 104* 09/20/2011   TRIG 196* 09/20/2011   CHOLHDL 5.1 09/20/2011    No problems with meds. Noted that his BP is high, going to try and decrease his salt intake. Has been watching his diet to decrease his lipids.  No hx of colonoscopy.    Review of Systems  Constitutional: Negative for appetite change and unexpected weight change.  Respiratory: Negative for chest tightness and shortness of breath.   Cardiovascular: Negative for chest pain.  Gastrointestinal: Negative for nausea and diarrhea.  Genitourinary: Negative for dysuria.  Neurological: Negative for headaches.       Objective:   Physical Exam  Constitutional: He appears well-developed and well-nourished.  HENT:  Mouth/Throat: No oropharyngeal exudate.  Eyes: EOM are normal. Pupils are equal, round, and reactive to light.  Neck: Neck supple.  Cardiovascular: Normal rate, regular rhythm and normal heart sounds.   Pulmonary/Chest: Effort normal and breath sounds normal. No respiratory distress.  Abdominal: Soft. Bowel sounds are normal. There is no tenderness.  Lymphadenopathy:    He has no cervical adenopathy.          Assessment & Plan:

## 2011-10-04 NOTE — Assessment & Plan Note (Signed)
Improved dramatically with diet change. Will continue to watch.

## 2011-11-14 ENCOUNTER — Other Ambulatory Visit: Payer: Self-pay | Admitting: Infectious Diseases

## 2012-01-02 ENCOUNTER — Ambulatory Visit: Payer: Self-pay

## 2012-07-10 ENCOUNTER — Ambulatory Visit: Payer: Self-pay

## 2012-09-10 ENCOUNTER — Other Ambulatory Visit (INDEPENDENT_AMBULATORY_CARE_PROVIDER_SITE_OTHER): Payer: Self-pay

## 2012-09-10 DIAGNOSIS — B20 Human immunodeficiency virus [HIV] disease: Secondary | ICD-10-CM

## 2012-09-10 LAB — CBC
HCT: 41.9 % (ref 39.0–52.0)
Hemoglobin: 14.9 g/dL (ref 13.0–17.0)
MCH: 31 pg (ref 26.0–34.0)
MCHC: 35.6 g/dL (ref 30.0–36.0)
MCV: 87.1 fL (ref 78.0–100.0)
RBC: 4.81 MIL/uL (ref 4.22–5.81)

## 2012-09-10 LAB — COMPREHENSIVE METABOLIC PANEL
Alkaline Phosphatase: 83 U/L (ref 39–117)
BUN: 11 mg/dL (ref 6–23)
CO2: 29 mEq/L (ref 19–32)
Glucose, Bld: 98 mg/dL (ref 70–99)
Total Bilirubin: 0.5 mg/dL (ref 0.3–1.2)

## 2012-09-11 LAB — T-HELPER CELL (CD4) - (RCID CLINIC ONLY)
CD4 % Helper T Cell: 35 % (ref 33–55)
CD4 T Cell Abs: 600 uL (ref 400–2700)

## 2012-09-25 ENCOUNTER — Ambulatory Visit (INDEPENDENT_AMBULATORY_CARE_PROVIDER_SITE_OTHER): Payer: Self-pay | Admitting: Infectious Diseases

## 2012-09-25 ENCOUNTER — Encounter: Payer: Self-pay | Admitting: Infectious Diseases

## 2012-09-25 VITALS — BP 163/96 | HR 79 | Temp 97.9°F | Ht 70.0 in | Wt 209.0 lb

## 2012-09-25 DIAGNOSIS — B20 Human immunodeficiency virus [HIV] disease: Secondary | ICD-10-CM

## 2012-09-25 DIAGNOSIS — Z23 Encounter for immunization: Secondary | ICD-10-CM

## 2012-09-25 DIAGNOSIS — I10 Essential (primary) hypertension: Secondary | ICD-10-CM

## 2012-09-25 DIAGNOSIS — E785 Hyperlipidemia, unspecified: Secondary | ICD-10-CM

## 2012-09-25 DIAGNOSIS — Z79899 Other long term (current) drug therapy: Secondary | ICD-10-CM

## 2012-09-25 DIAGNOSIS — Z113 Encounter for screening for infections with a predominantly sexual mode of transmission: Secondary | ICD-10-CM

## 2012-09-25 NOTE — Assessment & Plan Note (Signed)
Doing well, encouraged diet and exercise

## 2012-09-25 NOTE — Progress Notes (Signed)
  Subjective:    Patient ID: Patrick [name], male    DOB: [birth year], 62 y.o.   MRN: [id number]  HPI 62 yo M with HIV+ on ART since 2002, and hyperlipidemia. Taking ART without difficulty. No abn dreams.   HIV 1 RNA Quant (copies/mL)  Date Value  09/10/2012 99*  09/20/2011 <20   03/29/2011 <20      CD4 T Cell Abs (cmm)  Date Value  09/10/2012 600   09/20/2011 570   03/29/2011 630    Lab Results  Component Value Date   CHOL 178 09/20/2011   HDL 35* 09/20/2011   LDLCALC 104* 09/20/2011   TRIG 196* 09/20/2011   CHOLHDL 5.1 09/20/2011        Review of Systems  Constitutional: Negative for appetite change and unexpected weight change.  Respiratory: Negative for shortness of breath.   Cardiovascular: Negative for chest pain and leg swelling.  Gastrointestinal: Negative for diarrhea and constipation.  Genitourinary: Negative for difficulty urinating.  Neurological: Negative for headaches.       Objective:   Physical Exam  Constitutional: He appears well-developed and well-nourished.  HENT:  Mouth/Throat: No oropharyngeal exudate.  Eyes: EOM are normal. Pupils are equal, round, and reactive to light.  Neck: Neck supple.  Cardiovascular: Normal rate, regular rhythm and normal heart sounds.   Pulmonary/Chest: Effort normal and breath sounds normal.  Abdominal: Soft. Bowel sounds are normal. There is no tenderness.  Lymphadenopathy:    He has no cervical adenopathy.          Assessment & Plan:

## 2012-09-25 NOTE — Assessment & Plan Note (Signed)
He will continue to monitor at drug store. He does not want to start meds (afraid of side effects). I let him know that he is certainly at level where he is at risk for issues. Asked him to come back in 6 months for recheck, he wants to come back in 1 year. i asked him to call if he has problems in the intervening period.

## 2012-09-25 NOTE — Assessment & Plan Note (Signed)
He is doing very well. Needs PNVx update. Will cont his current meds. Offered/refused condoms. Will see him back in 1 yr (tried to get him back in 6 months)

## 2012-09-25 NOTE — Addendum Note (Signed)
Addended by: Wendall Mola A on: 09/25/2012 11:28 AM   Modules accepted: Orders

## 2012-11-11 ENCOUNTER — Other Ambulatory Visit: Payer: Self-pay | Admitting: *Deleted

## 2012-11-11 DIAGNOSIS — B20 Human immunodeficiency virus [HIV] disease: Secondary | ICD-10-CM

## 2012-11-11 MED ORDER — EFAVIRENZ-EMTRICITAB-TENOFOVIR 600-200-300 MG PO TABS: ORAL_TABLET | ORAL | Status: DC

## 2013-01-08 ENCOUNTER — Ambulatory Visit: Payer: Self-pay

## 2013-01-09 ENCOUNTER — Telehealth: Payer: Self-pay

## 2013-01-09 NOTE — Telephone Encounter (Signed)
Patient came in on 01/08/13 to recertify for adap/rw - had not mentioned had insurance, but once asked about applying for ACA, stated he had - insurance has $6,000 deductible and is more for catastrophic coverage - left messages for him to call Randolm Idol to see what type of patient asst., he can get - not eligible for PAN, unfortunately. Needs to bring in copy of card. Also, mentioned it would be a good idea to see if he could upgrade the silver plan, that way, he could save in out of pocket costs.

## 2013-01-13 ENCOUNTER — Telehealth: Payer: Self-pay

## 2013-01-13 NOTE — Telephone Encounter (Signed)
Patient called this am,stated he had dropped insurance w/the 6,000.00 deductible, as could not afford the Silver ACA plan.

## 2013-05-01 ENCOUNTER — Other Ambulatory Visit: Payer: Self-pay

## 2013-08-13 ENCOUNTER — Ambulatory Visit: Payer: Self-pay

## 2013-08-14 ENCOUNTER — Telehealth: Payer: Self-pay | Admitting: *Deleted

## 2013-08-14 DIAGNOSIS — B20 Human immunodeficiency virus [HIV] disease: Secondary | ICD-10-CM

## 2013-08-14 MED ORDER — EFAVIRENZ-EMTRICITAB-TENOFOVIR 600-200-300 MG PO TABS: ORAL_TABLET | ORAL | Status: DC

## 2013-08-14 NOTE — Telephone Encounter (Signed)
ADAP application 

## 2013-09-04 ENCOUNTER — Other Ambulatory Visit: Payer: Self-pay | Admitting: *Deleted

## 2013-09-04 DIAGNOSIS — B20 Human immunodeficiency virus [HIV] disease: Secondary | ICD-10-CM

## 2013-09-04 MED ORDER — EFAVIRENZ-EMTRICITAB-TENOFOVIR 600-200-300 MG PO TABS: ORAL_TABLET | ORAL | Status: DC

## 2013-09-10 ENCOUNTER — Other Ambulatory Visit (INDEPENDENT_AMBULATORY_CARE_PROVIDER_SITE_OTHER): Payer: Self-pay

## 2013-09-10 DIAGNOSIS — Z113 Encounter for screening for infections with a predominantly sexual mode of transmission: Secondary | ICD-10-CM

## 2013-09-10 DIAGNOSIS — B20 Human immunodeficiency virus [HIV] disease: Secondary | ICD-10-CM

## 2013-09-10 DIAGNOSIS — Z79899 Other long term (current) drug therapy: Secondary | ICD-10-CM

## 2013-09-10 LAB — CBC
HCT: 42.8 % (ref 39.0–52.0)
Hemoglobin: 14.9 g/dL (ref 13.0–17.0)
MCH: 30.6 pg (ref 26.0–34.0)
MCHC: 34.8 g/dL (ref 30.0–36.0)
MCV: 87.9 fL (ref 78.0–100.0)
PLATELETS: 211 10*3/uL (ref 150–400)
RBC: 4.87 MIL/uL (ref 4.22–5.81)
RDW: 13.5 % (ref 11.5–15.5)
WBC: 4.6 10*3/uL (ref 4.0–10.5)

## 2013-09-10 LAB — COMPREHENSIVE METABOLIC PANEL
ALBUMIN: 4.4 g/dL (ref 3.5–5.2)
ALK PHOS: 79 U/L (ref 39–117)
ALT: 19 U/L (ref 0–53)
AST: 22 U/L (ref 0–37)
BUN: 11 mg/dL (ref 6–23)
CALCIUM: 9.2 mg/dL (ref 8.4–10.5)
CHLORIDE: 104 meq/L (ref 96–112)
CO2: 30 mEq/L (ref 19–32)
Creat: 0.84 mg/dL (ref 0.50–1.35)
GLUCOSE: 101 mg/dL — AB (ref 70–99)
POTASSIUM: 4.2 meq/L (ref 3.5–5.3)
Sodium: 139 mEq/L (ref 135–145)
Total Bilirubin: 0.6 mg/dL (ref 0.2–1.2)
Total Protein: 6.5 g/dL (ref 6.0–8.3)

## 2013-09-10 LAB — LIPID PANEL
Cholesterol: 175 mg/dL (ref 0–200)
HDL: 38 mg/dL — AB (ref >39)
LDL Cholesterol: 95 mg/dL (ref 0–99)
TRIGLYCERIDES: 211 mg/dL — AB (ref <150)
Total CHOL/HDL Ratio: 4.6 Ratio
VLDL: 42 mg/dL — ABNORMAL HIGH (ref 0–40)

## 2013-09-11 LAB — HIV-1 RNA QUANT-NO REFLEX-BLD
HIV 1 RNA Quant: <20 copies/mL (ref <20)
HIV-1 RNA Quant, Log: <1.3 {Log} (ref <1.30)

## 2013-09-11 LAB — T-HELPER CELL (CD4) - (RCID CLINIC ONLY)
CD4 % Helper T Cell: 35 % (ref 33–55)
CD4 T Cell Abs: 650 /uL (ref 400–2700)

## 2013-09-11 LAB — RPR

## 2013-09-24 ENCOUNTER — Ambulatory Visit (INDEPENDENT_AMBULATORY_CARE_PROVIDER_SITE_OTHER): Payer: Self-pay | Admitting: Infectious Diseases

## 2013-09-24 ENCOUNTER — Encounter: Payer: Self-pay | Admitting: Infectious Diseases

## 2013-09-24 VITALS — BP 122/85 | HR 76 | Temp 98.8°F | Ht 70.0 in | Wt 207.0 lb

## 2013-09-24 DIAGNOSIS — B20 Human immunodeficiency virus [HIV] disease: Secondary | ICD-10-CM

## 2013-09-24 DIAGNOSIS — E785 Hyperlipidemia, unspecified: Secondary | ICD-10-CM

## 2013-09-24 MED ORDER — EMTRICITAB-RILPIVIR-TENOFOV DF 200-25-300 MG PO TABS: 1.0000 | ORAL_TABLET | Freq: Every day | ORAL | Status: DC

## 2013-09-24 NOTE — Assessment & Plan Note (Addendum)
Suggested he needs colonoscopy, he is waiting for Medicare. Will change him to complera for sfx. i offered to see him back in 6 months, he wants to come back in 1 year.

## 2013-09-24 NOTE — Progress Notes (Signed)
   Subjective:    Patient ID: Patrick [name], male    DOB: Apr 30, 1951, 63 y.o.   MRN: [id number]  HPI 63 yo M with HIV+ on ART (currently [place]) since 2002, and hyperlipidemia. Interested in switching to complera. Having trouble with CNS sfx (can't focus).  Has cut back on salt in his diet, BP improved.   HIV 1 RNA Quant (copies/mL)  Date Value  09/10/2013 <20   09/10/2012 99*  09/20/2011 <20      CD4 T Cell Abs (/uL)  Date Value  09/10/2013 650   09/10/2012 600   09/20/2011 570    Lab Results  Component Value Date   CHOL 175 09/10/2013   HDL 38* 09/10/2013   LDLCALC 95 09/10/2013   TRIG 211* 09/10/2013   CHOLHDL 4.6 09/10/2013     Review of Systems  Constitutional: Negative for appetite change and unexpected weight change.  Cardiovascular: Negative for chest pain.  Gastrointestinal: Negative for nausea and diarrhea.  Genitourinary: Negative for difficulty urinating.  Neurological: Negative for headaches.       Objective:   Physical Exam  Constitutional: He appears well-developed and well-nourished.  HENT:  Mouth/Throat: No oropharyngeal exudate.  Eyes: EOM are normal. Pupils are equal, round, and reactive to light.  Neck: Neck supple.  Cardiovascular: Normal rate, regular rhythm and normal heart sounds.   Pulmonary/Chest: Effort normal and breath sounds normal.  Abdominal: Soft. Bowel sounds are normal. He exhibits no distension. There is no tenderness.  Lymphadenopathy:    He has no cervical adenopathy.          Assessment & Plan:

## 2013-09-24 NOTE — Progress Notes (Signed)
  [facility] - Pharmacist    HPI: Patrick [name] is a 63 y.o. male with PMH of HIV and HLD.   Allergies: No Known Allergies  Vitals: Temp: 98.8 F (37.1 C) (04/01 1507) Temp src: Oral (04/01 1507) BP: 122/85 mmHg (04/01 1507) Pulse Rate: 76 (04/01 1507)  Past Medical History: No past medical history on file.  Social History: History   Social History  . Marital Status: Single    Spouse Name: N/A    Number of Children: N/A  . Years of Education: N/A   Social History Main Topics  . Smoking status: Former Smoker    Quit date: 06/23/1997  . Smokeless tobacco: Never Used  . Alcohol Use: No  . Drug Use: No  . Sexual Activity: No     Comment: pt. declined condoms   Other Topics Concern  . None   Social History Narrative  . None    Previous Regimen: Atripla since 2002  Current Regimen: Complera   Labs: HIV 1 RNA Quant (copies/mL)  Date Value  09/10/2013 <20   09/10/2012 99*  09/20/2011 <20      CD4 T Cell Abs (/uL)  Date Value  09/10/2013 650   09/10/2012 600   09/20/2011 570      Hep B S Ab (no units)  Date Value  08/20/2006 NO      Hepatitis B Surface Ag (no units)  Date Value  08/20/2006 NO      HCV Ab (no units)  Date Value  08/20/2006 NO     CrCl: Estimated Creatinine Clearance: 103.6 ml/min (by C-G formula based on Cr of 0.84).  Lipids:    Component Value Date/Time   CHOL 175 09/10/2013 1054   TRIG 211* 09/10/2013 1054   HDL 38* 09/10/2013 1054   CHOLHDL 4.6 09/10/2013 1054   VLDL 42* 09/10/2013 1054   LDLCALC 95 09/10/2013 1054    Assessment: [name] is a pleasant 63 yo M who presents today with concerns of increased irritation and cloudy thoughts. Patient discussed switching his regimen from [place] to complera.   Recommendations: After discussing with the physician we decided to switch the patients regimen from atripla to complera.  Follow-up on patients compliance and adverse effects on new regimen  at next visit.   Angelica Chessman, Pharm.D. Clinical PGY1 Pharmacy Resident [facility] 09/24/2013, 4:54 PM

## 2013-09-24 NOTE — Assessment & Plan Note (Signed)
Well controlled 

## 2013-12-30 ENCOUNTER — Ambulatory Visit: Payer: Self-pay

## 2014-01-06 ENCOUNTER — Other Ambulatory Visit: Payer: Self-pay | Admitting: Licensed Clinical Social Worker

## 2014-01-06 DIAGNOSIS — B20 Human immunodeficiency virus [HIV] disease: Secondary | ICD-10-CM

## 2014-01-06 MED ORDER — EMTRICITAB-RILPIVIR-TENOFOV DF 200-25-300 MG PO TABS: 1.0000 | ORAL_TABLET | Freq: Every day | ORAL | Status: DC

## 2014-08-13 ENCOUNTER — Ambulatory Visit: Payer: Self-pay

## 2014-08-26 ENCOUNTER — Other Ambulatory Visit: Payer: Self-pay | Admitting: *Deleted

## 2014-08-26 DIAGNOSIS — B20 Human immunodeficiency virus [HIV] disease: Secondary | ICD-10-CM

## 2014-08-26 MED ORDER — EMTRICITAB-RILPIVIR-TENOFOV DF 200-25-300 MG PO TABS: 1.0000 | ORAL_TABLET | Freq: Every day | ORAL | Status: DC

## 2014-09-22 ENCOUNTER — Other Ambulatory Visit (INDEPENDENT_AMBULATORY_CARE_PROVIDER_SITE_OTHER): Payer: Self-pay

## 2014-09-22 DIAGNOSIS — B20 Human immunodeficiency virus [HIV] disease: Secondary | ICD-10-CM

## 2014-09-22 DIAGNOSIS — Z79899 Other long term (current) drug therapy: Secondary | ICD-10-CM

## 2014-09-22 DIAGNOSIS — E785 Hyperlipidemia, unspecified: Secondary | ICD-10-CM

## 2014-09-22 LAB — LIPID PANEL
CHOL/HDL RATIO: 5.6 ratio
CHOLESTEROL: 162 mg/dL (ref 0–200)
HDL: 29 mg/dL — AB (ref >=40)
LDL Cholesterol: 98 mg/dL (ref 0–99)
Triglycerides: 173 mg/dL — ABNORMAL HIGH (ref <150)
VLDL: 35 mg/dL (ref 0–40)

## 2014-09-22 LAB — COMPREHENSIVE METABOLIC PANEL
ALT: 26 U/L (ref 0–53)
AST: 26 U/L (ref 0–37)
Albumin: 4.5 g/dL (ref 3.5–5.2)
Alkaline Phosphatase: 68 U/L (ref 39–117)
BUN: 14 mg/dL (ref 6–23)
CHLORIDE: 103 meq/L (ref 96–112)
CO2: 31 meq/L (ref 19–32)
Calcium: 9.6 mg/dL (ref 8.4–10.5)
Creat: 0.83 mg/dL (ref 0.50–1.35)
Glucose, Bld: 86 mg/dL (ref 70–99)
Potassium: 4.2 mEq/L (ref 3.5–5.3)
SODIUM: 141 meq/L (ref 135–145)
Total Bilirubin: 0.8 mg/dL (ref 0.2–1.2)
Total Protein: 6.8 g/dL (ref 6.0–8.3)

## 2014-09-22 LAB — CBC
HCT: 45.5 % (ref 39.0–52.0)
HEMOGLOBIN: 15.2 g/dL (ref 13.0–17.0)
MCH: 29.5 pg (ref 26.0–34.0)
MCHC: 33.4 g/dL (ref 30.0–36.0)
MCV: 88.2 fL (ref 78.0–100.0)
MPV: 8.8 fL (ref 8.6–12.4)
Platelets: 180 10*3/uL (ref 150–400)
RBC: 5.16 MIL/uL (ref 4.22–5.81)
RDW: 14 % (ref 11.5–15.5)
WBC: 5.7 10*3/uL (ref 4.0–10.5)

## 2014-09-22 LAB — HEPATITIS B SURFACE ANTIBODY,QUALITATIVE: Hep B S Ab: NEGATIVE

## 2014-09-22 NOTE — Addendum Note (Signed)
Addended by: Dolan Amen D on: 09/22/2014 03:34 PM   Modules accepted: Orders

## 2014-09-23 LAB — HIV-1 RNA QUANT-NO REFLEX-BLD: HIV-1 RNA Quant, Log: <1.3 {Log} (ref <1.30)

## 2014-09-23 LAB — T-HELPER CELL (CD4) - (RCID CLINIC ONLY)
CD4 T CELL HELPER: 35 % (ref 33–55)
CD4 T Cell Abs: 710 /uL (ref 400–2700)

## 2014-10-01 ENCOUNTER — Other Ambulatory Visit: Payer: Self-pay | Admitting: Infectious Diseases

## 2014-10-01 DIAGNOSIS — B20 Human immunodeficiency virus [HIV] disease: Secondary | ICD-10-CM

## 2014-10-06 ENCOUNTER — Ambulatory Visit (INDEPENDENT_AMBULATORY_CARE_PROVIDER_SITE_OTHER): Payer: Self-pay | Admitting: Infectious Diseases

## 2014-10-06 ENCOUNTER — Encounter: Payer: Self-pay | Admitting: Infectious Diseases

## 2014-10-06 VITALS — BP 138/77 | HR 66 | Temp 98.5°F | Ht 70.0 in | Wt 217.0 lb

## 2014-10-06 DIAGNOSIS — E785 Hyperlipidemia, unspecified: Secondary | ICD-10-CM

## 2014-10-06 DIAGNOSIS — B20 Human immunodeficiency virus [HIV] disease: Secondary | ICD-10-CM

## 2014-10-06 DIAGNOSIS — Z113 Encounter for screening for infections with a predominantly sexual mode of transmission: Secondary | ICD-10-CM

## 2014-10-06 DIAGNOSIS — Z79899 Other long term (current) drug therapy: Secondary | ICD-10-CM

## 2014-10-06 DIAGNOSIS — I10 Essential (primary) hypertension: Secondary | ICD-10-CM

## 2014-10-06 MED ORDER — EMTRICITAB-RILPIVIR-TENOFOV DF 200-25-300 MG PO TABS: 1.0000 | ORAL_TABLET | Freq: Every day | ORAL | Status: DC

## 2014-10-06 NOTE — Assessment & Plan Note (Signed)
Doing well, encourage diet and exercise. hopefully better with med switch.

## 2014-10-06 NOTE — Assessment & Plan Note (Signed)
He is doing very well. Likes his new ART.  Offered/refused condoms.  Wants to have yearly visits.  rtc 1 yr with labs prior.

## 2014-10-06 NOTE — Assessment & Plan Note (Signed)
Doing well, off meds. Taking baby ASA. Will continue to monitor.

## 2014-10-06 NOTE — Progress Notes (Signed)
   Subjective:    Patient ID: [name], male    DOB: Nov 14, 1950, 64 y.o.   MRN: [id number]  HPI  64 yo M with HIV+ on ART since 2002, and hyperlipidemia. Currently on complera. Sleep is better now.  No problems. Has been feeling well.  Looked at his labs via "mychart". Has not gotten colon, wants to wait.  Knows he wants to get back on diet, exercise.   HIV 1 RNA QUANT (copies/mL)  Date Value  09/22/2014 <20  09/10/2013 <20  09/10/2012 99*   CD4 T CELL ABS  Date Value  09/22/2014 710 /uL  09/10/2013 650 /uL  09/10/2012 600 cmm   Review of Systems  Constitutional: Negative for appetite change and unexpected weight change.  Gastrointestinal: Negative for diarrhea and constipation.  Genitourinary: Negative for difficulty urinating.       Objective:   Physical Exam  Constitutional: He appears well-developed and well-nourished.  HENT:  Mouth/Throat: No oropharyngeal exudate.  Eyes: EOM are normal. Pupils are equal, round, and reactive to light.  Neck: Neck supple.  Cardiovascular: Normal rate, regular rhythm and normal heart sounds.   Pulmonary/Chest: Effort normal and breath sounds normal.  Abdominal: Soft. Bowel sounds are normal. He exhibits no distension. There is no tenderness.  Lymphadenopathy:    He has no cervical adenopathy.       Assessment & Plan:

## 2014-12-29 ENCOUNTER — Other Ambulatory Visit: Payer: Self-pay | Admitting: Infectious Diseases

## 2015-01-21 ENCOUNTER — Ambulatory Visit: Payer: Self-pay

## 2015-01-26 ENCOUNTER — Ambulatory Visit: Payer: Self-pay

## 2015-02-19 NOTE — Progress Notes (Signed)
Notified pharmacy. Landis Gandy, RN

## 2015-07-29 ENCOUNTER — Other Ambulatory Visit: Payer: Self-pay | Admitting: Infectious Diseases

## 2015-07-29 DIAGNOSIS — B20 Human immunodeficiency virus [HIV] disease: Secondary | ICD-10-CM

## 2015-09-01 ENCOUNTER — Other Ambulatory Visit: Payer: Self-pay | Admitting: Infectious Diseases

## 2015-09-29 ENCOUNTER — Other Ambulatory Visit: Payer: Self-pay

## 2015-10-27 ENCOUNTER — Other Ambulatory Visit (INDEPENDENT_AMBULATORY_CARE_PROVIDER_SITE_OTHER): Payer: Self-pay

## 2015-10-27 DIAGNOSIS — E785 Hyperlipidemia, unspecified: Secondary | ICD-10-CM

## 2015-10-27 DIAGNOSIS — Z79899 Other long term (current) drug therapy: Secondary | ICD-10-CM

## 2015-10-27 DIAGNOSIS — Z113 Encounter for screening for infections with a predominantly sexual mode of transmission: Secondary | ICD-10-CM

## 2015-10-27 DIAGNOSIS — B20 Human immunodeficiency virus [HIV] disease: Secondary | ICD-10-CM

## 2015-10-27 LAB — COMPREHENSIVE METABOLIC PANEL
ALBUMIN: 4.4 g/dL (ref 3.6–5.1)
ALT: 13 U/L (ref 9–46)
AST: 15 U/L (ref 10–35)
Alkaline Phosphatase: 83 U/L (ref 40–115)
BILIRUBIN TOTAL: 0.7 mg/dL (ref 0.2–1.2)
BUN: 11 mg/dL (ref 7–25)
CALCIUM: 9.4 mg/dL (ref 8.6–10.3)
CHLORIDE: 103 mmol/L (ref 98–110)
CO2: 30 mmol/L (ref 20–31)
Creat: 0.95 mg/dL (ref 0.70–1.25)
GLUCOSE: 89 mg/dL (ref 65–99)
POTASSIUM: 4.4 mmol/L (ref 3.5–5.3)
Sodium: 141 mmol/L (ref 135–146)
Total Protein: 6.5 g/dL (ref 6.1–8.1)

## 2015-10-27 LAB — CBC
HEMATOCRIT: 44.4 % (ref 38.5–50.0)
HEMOGLOBIN: 14.7 g/dL (ref 13.2–17.1)
MCH: 29.8 pg (ref 27.0–33.0)
MCHC: 33.1 g/dL (ref 32.0–36.0)
MCV: 89.9 fL (ref 80.0–100.0)
MPV: 9.1 fL (ref 7.5–12.5)
Platelets: 193 10*3/uL (ref 140–400)
RBC: 4.94 MIL/uL (ref 4.20–5.80)
RDW: 13.7 % (ref 11.0–15.0)
WBC: 5.1 10*3/uL (ref 3.8–10.8)

## 2015-10-27 LAB — LIPID PANEL
CHOL/HDL RATIO: 4.8 ratio (ref <=5.0)
Cholesterol: 138 mg/dL (ref 125–200)
HDL: 29 mg/dL — AB (ref >=40)
LDL CALC: 70 mg/dL (ref <130)
TRIGLYCERIDES: 193 mg/dL — AB (ref <150)
VLDL: 39 mg/dL — AB (ref <30)

## 2015-10-28 LAB — T-HELPER CELL (CD4) - (RCID CLINIC ONLY)
CD4 T CELL HELPER: 36 % (ref 33–55)
CD4 T Cell Abs: 590 /uL (ref 400–2700)

## 2015-10-28 LAB — HIV-1 RNA QUANT-NO REFLEX-BLD
HIV 1 RNA Quant: <20 copies/mL (ref <20)
HIV-1 RNA Quant, Log: <1.3 Log copies/mL (ref <1.30)

## 2015-10-28 LAB — RPR

## 2015-11-10 ENCOUNTER — Ambulatory Visit: Payer: Self-pay | Admitting: Infectious Diseases

## 2015-11-10 ENCOUNTER — Ambulatory Visit (INDEPENDENT_AMBULATORY_CARE_PROVIDER_SITE_OTHER): Payer: PPO | Admitting: Infectious Diseases

## 2015-11-10 ENCOUNTER — Encounter: Payer: Self-pay | Admitting: Infectious Diseases

## 2015-11-10 VITALS — BP 151/96 | HR 78 | Temp 97.9°F | Ht 70.0 in | Wt 215.0 lb

## 2015-11-10 DIAGNOSIS — B20 Human immunodeficiency virus [HIV] disease: Secondary | ICD-10-CM

## 2015-11-10 DIAGNOSIS — E785 Hyperlipidemia, unspecified: Secondary | ICD-10-CM | POA: Diagnosis not present

## 2015-11-10 DIAGNOSIS — Z113 Encounter for screening for infections with a predominantly sexual mode of transmission: Secondary | ICD-10-CM

## 2015-11-10 DIAGNOSIS — I10 Essential (primary) hypertension: Secondary | ICD-10-CM

## 2015-11-10 MED ORDER — EMTRICITAB-RILPIVIR-TENOFOV AF 200-25-25 MG PO TABS: 1.0000 | ORAL_TABLET | Freq: Every day | ORAL | Status: DC

## 2015-11-10 NOTE — Progress Notes (Signed)
   Subjective:    Patient ID: Patrick [name], male    DOB: 12/03/1950, 65 y.o.   MRN: [id number]  HPI 65 yo M with HIV+ on ART since 2002, and hyperlipidemia. Currently on complera and baby ASA. MVI. Ca+ citrate,  vitamin D  HIV 1 RNA QUANT (copies/mL)  Date Value  10/27/2015 <20  09/22/2014 <20  09/10/2013 <20   CD4 T CELL ABS (/uL)  Date Value  10/27/2015 590  09/22/2014 710  09/10/2013 650   Has been feeling well.  Has not had colon.  No problems with ART.   Review of Systems  Constitutional: Negative for appetite change and unexpected weight change.  Gastrointestinal: Negative for diarrhea and constipation.  Genitourinary: Negative for difficulty urinating.       Objective:   Physical Exam  Constitutional: He appears well-developed and well-nourished.  Eyes: EOM are normal. Pupils are equal, round, and reactive to light.  Neck: Neck supple.  Cardiovascular: Normal rate, regular rhythm and normal heart sounds.   Pulmonary/Chest: Effort normal and breath sounds normal.  Abdominal: Soft. Bowel sounds are normal. There is no tenderness. There is no rebound.  Musculoskeletal: He exhibits no edema.  Lymphadenopathy:    He has no cervical adenopathy.       Assessment & Plan:

## 2015-11-10 NOTE — Assessment & Plan Note (Signed)
Will change his complera to odefsy.  Will see him back in 12 months.  He is doing very well.

## 2015-11-10 NOTE — Assessment & Plan Note (Signed)
Much better today

## 2015-11-10 NOTE — Assessment & Plan Note (Signed)
Encouraged him to exercise, lose wt.  Will get him into PCP via [facility].

## 2016-01-19 ENCOUNTER — Encounter: Payer: Self-pay | Admitting: Infectious Diseases

## 2016-08-09 ENCOUNTER — Ambulatory Visit: Payer: PPO

## 2016-08-14 ENCOUNTER — Encounter: Payer: Self-pay | Admitting: Infectious Diseases

## 2016-09-13 ENCOUNTER — Other Ambulatory Visit: Payer: PPO

## 2016-09-13 ENCOUNTER — Other Ambulatory Visit (HOSPITAL_COMMUNITY)
Admission: RE | Admit: 2016-09-13 | Discharge: 2016-09-13 | Disposition: A | Discharge status: Home / Self Care | Payer: PPO | Source: Ambulatory Visit | Attending: Infectious Diseases | Admitting: Infectious Diseases

## 2016-09-13 DIAGNOSIS — E785 Hyperlipidemia, unspecified: Secondary | ICD-10-CM | POA: Diagnosis not present

## 2016-09-13 DIAGNOSIS — B20 Human immunodeficiency virus [HIV] disease: Secondary | ICD-10-CM

## 2016-09-13 DIAGNOSIS — Z113 Encounter for screening for infections with a predominantly sexual mode of transmission: Secondary | ICD-10-CM | POA: Diagnosis not present

## 2016-09-13 LAB — CBC
HCT: 45.3 % (ref 38.5–50.0)
Hemoglobin: 15.8 g/dL (ref 13.2–17.1)
MCH: 31.6 pg (ref 27.0–33.0)
MCHC: 34.9 g/dL (ref 32.0–36.0)
MCV: 90.6 fL (ref 80.0–100.0)
MPV: 9.1 fL (ref 7.5–12.5)
PLATELETS: 191 10*3/uL (ref 140–400)
RBC: 5 MIL/uL (ref 4.20–5.80)
RDW: 13.6 % (ref 11.0–15.0)
WBC: 4.9 10*3/uL (ref 3.8–10.8)

## 2016-09-13 LAB — COMPREHENSIVE METABOLIC PANEL
ALT: 24 U/L (ref 9–46)
AST: 24 U/L (ref 10–35)
Albumin: 4.6 g/dL (ref 3.6–5.1)
Alkaline Phosphatase: 67 U/L (ref 40–115)
BUN: 13 mg/dL (ref 7–25)
CO2: 31 mmol/L (ref 20–31)
Calcium: 9.5 mg/dL (ref 8.6–10.3)
Chloride: 105 mmol/L (ref 98–110)
Creat: 0.86 mg/dL (ref 0.70–1.25)
GLUCOSE: 94 mg/dL (ref 65–99)
POTASSIUM: 4.2 mmol/L (ref 3.5–5.3)
Sodium: 142 mmol/L (ref 135–146)
Total Bilirubin: 0.9 mg/dL (ref 0.2–1.2)
Total Protein: 6.7 g/dL (ref 6.1–8.1)

## 2016-09-13 LAB — LIPID PANEL
Cholesterol: 177 mg/dL (ref <200)
HDL: 42 mg/dL (ref >40)
LDL CALC: 108 mg/dL — AB (ref <100)
Total CHOL/HDL Ratio: 4.2 Ratio (ref <5.0)
Triglycerides: 135 mg/dL (ref <150)
VLDL: 27 mg/dL (ref <30)

## 2016-09-14 LAB — URINE CYTOLOGY ANCILLARY ONLY
Chlamydia: NEGATIVE
Neisseria Gonorrhea: NEGATIVE

## 2016-09-14 LAB — T-HELPER CELL (CD4) - (RCID CLINIC ONLY)
CD4 % Helper T Cell: 36 % (ref 33–55)
CD4 T Cell Abs: 510 /uL (ref 400–2700)

## 2016-09-14 LAB — RPR

## 2016-09-15 LAB — HIV-1 RNA QUANT-NO REFLEX-BLD
HIV 1 RNA Quant: <20 copies/mL
HIV-1 RNA Quant, Log: <1.3 Log copies/mL

## 2016-10-02 ENCOUNTER — Ambulatory Visit: Payer: PPO | Admitting: Infectious Diseases

## 2016-11-17 ENCOUNTER — Other Ambulatory Visit: Payer: Self-pay | Admitting: Infectious Diseases

## 2016-11-17 DIAGNOSIS — B20 Human immunodeficiency virus [HIV] disease: Secondary | ICD-10-CM

## 2016-11-21 ENCOUNTER — Other Ambulatory Visit: Payer: Self-pay | Admitting: *Deleted

## 2016-11-27 ENCOUNTER — Ambulatory Visit (INDEPENDENT_AMBULATORY_CARE_PROVIDER_SITE_OTHER): Payer: PPO | Admitting: Infectious Diseases

## 2016-11-27 ENCOUNTER — Encounter: Payer: Self-pay | Admitting: Gastroenterology

## 2016-11-27 ENCOUNTER — Encounter: Payer: Self-pay | Admitting: Infectious Diseases

## 2016-11-27 VITALS — BP 159/94 | HR 72 | Temp 98.2°F | Ht 71.0 in | Wt 241.0 lb

## 2016-11-27 DIAGNOSIS — Z113 Encounter for screening for infections with a predominantly sexual mode of transmission: Secondary | ICD-10-CM

## 2016-11-27 DIAGNOSIS — Z79899 Other long term (current) drug therapy: Secondary | ICD-10-CM | POA: Diagnosis not present

## 2016-11-27 DIAGNOSIS — B20 Human immunodeficiency virus [HIV] disease: Secondary | ICD-10-CM | POA: Diagnosis not present

## 2016-11-27 DIAGNOSIS — I10 Essential (primary) hypertension: Secondary | ICD-10-CM | POA: Diagnosis not present

## 2016-11-27 DIAGNOSIS — E784 Other hyperlipidemia: Secondary | ICD-10-CM | POA: Diagnosis not present

## 2016-11-27 DIAGNOSIS — E7849 Other hyperlipidemia: Secondary | ICD-10-CM

## 2016-11-27 NOTE — Assessment & Plan Note (Signed)
Encouraged diet and exercise.  Wants to defer rx for now.

## 2016-11-27 NOTE — Assessment & Plan Note (Signed)
Lipids well controlled off rx

## 2016-11-27 NOTE — Assessment & Plan Note (Signed)
He is doing well Defers tet booster.  Offered/refused condoms.  Will set up for colonoscopy Will see him back in 9 months

## 2016-11-27 NOTE — Progress Notes (Signed)
   Subjective:    Patient ID: Patrick [name], male    DOB: August 06, 1950, 66 y.o.   MRN: [id number]  HPI 66 yo M with HIV+ on ART since 2002, and hyperlipidemia. Currently on complera --> odefsy and baby ASA. MVI. Ca+ citrate, vitamin D  HIV 1 RNA Quant (copies/mL)  Date Value  09/13/2016 <20 NOT DETECTED  10/27/2015 <20  09/22/2014 <20   CD4 T Cell Abs (/uL)  Date Value  09/13/2016 510  10/27/2015 590  09/22/2014 710   Has been feeling well.  Taking ART with food. No missed doses.  Has not had colon.   Review of Systems  Constitutional: Positive for unexpected weight change. Negative for appetite change.  Respiratory: Negative for cough and shortness of breath.   Cardiovascular: Negative for leg swelling.  Gastrointestinal: Negative for constipation and diarrhea.  Genitourinary: Negative for difficulty urinating.  Neurological: Negative for headaches.  Hematological: Does not bruise/bleed easily.   Too much processed food.     Objective:   Physical Exam  Constitutional: He appears well-developed and well-nourished.  HENT:  Mouth/Throat: No oropharyngeal exudate.  Eyes: EOM are normal. Pupils are equal, round, and reactive to light.  Neck: Neck supple.  Cardiovascular: Normal rate, regular rhythm and normal heart sounds.   Pulmonary/Chest: Effort normal and breath sounds normal.  Abdominal: Soft. Bowel sounds are normal. There is no tenderness. There is no rebound.  Musculoskeletal: He exhibits no edema.  Lymphadenopathy:    He has no cervical adenopathy.  Psychiatric: He has a normal mood and affect.       Assessment & Plan:

## 2016-11-28 ENCOUNTER — Telehealth: Payer: Self-pay

## 2016-11-28 NOTE — Telephone Encounter (Signed)
Called Pt to inform him of upcoming appointments for colonoscopy. Pt states that he knows that dates and times via MyChart messages. Confirmed dates and time with patient.

## 2016-12-18 ENCOUNTER — Ambulatory Visit (AMBULATORY_SURGERY_CENTER): Payer: Self-pay | Admitting: *Deleted

## 2016-12-18 ENCOUNTER — Encounter: Payer: Self-pay | Admitting: Gastroenterology

## 2016-12-18 VITALS — Ht 70.0 in | Wt 216.0 lb

## 2016-12-18 DIAGNOSIS — Z1211 Encounter for screening for malignant neoplasm of colon: Secondary | ICD-10-CM

## 2016-12-18 MED ORDER — NA SULFATE-K SULFATE-MG SULF 17.5-3.13-1.6 GM/177ML PO SOLN: ORAL | 0 refills | Status: DC

## 2016-12-18 NOTE — Progress Notes (Signed)
Pt denies allergies to eggs or soy products. Denies difficulty with sedation or anesthesia. Denies any diet or weight loss medications. Denies use of supplemental oxygen.  Emmi instructions given for procedure.  

## 2017-01-01 ENCOUNTER — Ambulatory Visit (AMBULATORY_SURGERY_CENTER): Payer: PPO | Admitting: Gastroenterology

## 2017-01-01 ENCOUNTER — Encounter: Payer: Self-pay | Admitting: Gastroenterology

## 2017-01-01 VITALS — BP 154/93 | HR 58 | Temp 97.8°F | Resp 16 | Ht 71.0 in | Wt 216.0 lb

## 2017-01-01 DIAGNOSIS — D122 Benign neoplasm of ascending colon: Secondary | ICD-10-CM | POA: Diagnosis not present

## 2017-01-01 DIAGNOSIS — D120 Benign neoplasm of cecum: Secondary | ICD-10-CM | POA: Diagnosis not present

## 2017-01-01 DIAGNOSIS — Z1211 Encounter for screening for malignant neoplasm of colon: Secondary | ICD-10-CM

## 2017-01-01 DIAGNOSIS — Z1212 Encounter for screening for malignant neoplasm of rectum: Secondary | ICD-10-CM

## 2017-01-01 MED ORDER — SODIUM CHLORIDE 0.9 % IV SOLN
500.0000 mL | INTRAVENOUS | Status: DC
Start: 2017-01-01 — End: 2017-10-11

## 2017-01-01 NOTE — Patient Instructions (Signed)
YOU HAD AN ENDOSCOPIC PROCEDURE TODAY AT THE [facility]:   Refer to the procedure report that was given to you for any specific questions about what was found during the examination.  If the procedure report does not answer your questions, please call your gastroenterologist to clarify.  If you requested that your care partner not be given the details of your procedure findings, then the procedure report has been included in a sealed envelope for you to review at your convenience later.  YOU SHOULD EXPECT: Some feelings of bloating in the abdomen. Passage of more gas than usual.  Walking can help get rid of the air that was put into your GI tract during the procedure and reduce the bloating. If you had a lower endoscopy (such as a colonoscopy or flexible sigmoidoscopy) you may notice spotting of blood in your stool or on the toilet paper. If you underwent a bowel prep for your procedure, you may not have a normal bowel movement for a few days.  Please Note:  You might notice some irritation and congestion in your nose or some drainage.  This is from the oxygen used during your procedure.  There is no need for concern and it should clear up in a day or so.  SYMPTOMS TO REPORT IMMEDIATELY:   Following lower endoscopy (colonoscopy or flexible sigmoidoscopy):  Excessive amounts of blood in the stool  Significant tenderness or worsening of abdominal pains  Swelling of the abdomen that is new, acute  Fever of 100F or higher  For urgent or emergent issues, a gastroenterologist can be reached at any hour by calling [phone].   DIET:  We do recommend a small meal at first, but then you may proceed to your regular diet.  Drink plenty of fluids but you should avoid alcoholic beverages for 24 hours.  ACTIVITY:  You should plan to take it easy for the rest of today and you should NOT DRIVE or use heavy machinery until tomorrow (because of the sedation medicines used during the test).     FOLLOW UP: Our staff will call the number listed on your records the next business day following your procedure to check on you and address any questions or concerns that you may have regarding the information given to you following your procedure. If we do not reach you, we will leave a message.  However, if you are feeling well and you are not experiencing any problems, there is no need to return our call.  We will assume that you have returned to your regular daily activities without incident.  If any biopsies were taken you will be contacted by phone or by letter within the next 1-3 weeks.  Please call us at [phone] if you have not heard about the biopsies in 3 weeks.   Await for biopsy results to determine next repeat Colonoscopy screening Polyps (handout given)  SIGNATURES/CONFIDENTIALITY: You and/or your care partner have signed paperwork which will be entered into your electronic medical record.  These signatures attest to the fact that that the information above on your After Visit Summary has been reviewed and is understood.  Full responsibility of the confidentiality of this discharge information lies with you and/or your care-partner. 

## 2017-01-01 NOTE — Progress Notes (Signed)
To PACU, vss patent aw report to rn 

## 2017-01-01 NOTE — Progress Notes (Signed)
Pt's states no medical or surgical changes since previsit or office visit. 

## 2017-01-01 NOTE — Progress Notes (Signed)
Called to room to assist during endoscopic procedure.  Patient ID and intended procedure confirmed with present staff. Received instructions for my participation in the procedure from the performing physician.  

## 2017-01-01 NOTE — Op Note (Signed)
[facility] [name] Patient Name: Patrick [name] Procedure Date: 01/01/2017 9:21 AM MRN: [id number] Endoscopist: Mallie Mussel L. Loletha Carrow , MD Age: 66 Referring MD:  Date of Birth: 1951-01-17 Gender: Male Account #: [id number] Procedure:                Colonoscopy Indications:              Screening for colorectal malignant neoplasm, This                            is the patient's first colonoscopy Medicines:                Monitored Anesthesia Care Procedure:                Pre-Anesthesia Assessment:                           - Prior to the procedure, a History and Physical                            was performed, and patient medications and                            allergies were reviewed. The patient's tolerance of                            previous anesthesia was also reviewed. The risks                            and benefits of the procedure and the sedation                            options and risks were discussed with the patient.                            All questions were answered, and informed consent                            was obtained. Anticoagulants: The patient has taken                            aspirin. It was decided not to withhold this                            medication prior to the procedure. ASA Grade                            Assessment: III - A patient with severe systemic                            disease. After reviewing the risks and benefits,                            the patient was deemed in satisfactory condition to  undergo the procedure.                           After obtaining informed consent, the colonoscope                            was passed under direct vision. Throughout the                            procedure, the patient's blood pressure, pulse, and                            oxygen saturations were monitored continuously. The                            Model PCF-H190DL [id number]) scope was  introduced                            through the anus and advanced to the the cecum,                            identified by appendiceal orifice and ileocecal                            valve. The colonoscopy was performed without                            difficulty. The patient tolerated the procedure                            well. The quality of the bowel preparation was                            good. The ileocecal valve, appendiceal orifice, and                            rectum were photographed. The quality of the bowel                            preparation was evaluated using the BBPS [facility]                            Bowel Preparation Scale) with scores of: Right                            Colon = 2, Transverse Colon = 2 and Left Colon = 2.                            The total BBPS score equals 6. The bowel                            preparation used was SUPREP. Scope In: 9:32:17 AM Scope Out: 3:01:60 AM Scope Withdrawal Time: 0 hours 8 minutes 20 seconds  Total Procedure Duration: 0 hours 24 minutes 2 seconds  Findings:                 The digital rectal exam findings include decreased                            sphincter tone.                           Two sessile polyps were found in the proximal                            ascending colon and appendiceal orifice. The polyps                            were 4 mm in size. These polyps were removed with a                            cold snare. Resection and retrieval were complete.                           The colon (entire examined portion) was moderately                            redundant.                           The exam was otherwise without abnormality on                            direct and retroflexion views. Complications:            No immediate complications. Estimated Blood Loss:     Estimated blood loss: none. Impression:               - Decreased sphincter tone found on digital rectal                             exam.                           - Two 4 mm polyps in the proximal ascending colon                            and at the appendiceal orifice, removed with a cold                            snare. Resected and retrieved.                           - Redundant colon.                           - The examination was otherwise normal on direct                            and  retroflexion views. Recommendation:           - Patient has a contact number available for                            emergencies. The signs and symptoms of potential                            delayed complications were discussed with the                            patient. Return to normal activities tomorrow.                            Written discharge instructions were provided to the                            patient.                           - Resume previous diet.                           - Continue present medications.                           - Await pathology results.                           - Repeat colonoscopy is recommended for                            surveillance. The colonoscopy date will be                            determined after pathology results from today's                            exam become available for review. [name] L. Loletha Carrow, MD 01/01/2017 9:58:19 AM This report has been signed electronically.

## 2017-01-02 ENCOUNTER — Telehealth: Payer: Self-pay

## 2017-01-02 NOTE — Telephone Encounter (Signed)
  Follow up Call-  Call back number 01/01/2017  Post procedure Call Back phone  # [phone]  Permission to leave phone message No  Some recent data might be hidden     Patient questions:  Do you have a fever, pain , or abdominal swelling? No. Pain Score  0 *  Have you tolerated food without any problems? Yes.    Have you been able to return to your normal activities? Yes.    Do you have any questions about your discharge instructions: Diet   No. Medications  No. Follow up visit  No.  Do you have questions or concerns about your Care? No.  Actions: * If pain score is 4 or above: No action needed, pain <4.

## 2017-01-08 ENCOUNTER — Encounter: Payer: Self-pay | Admitting: Gastroenterology

## 2017-01-11 ENCOUNTER — Ambulatory Visit: Payer: PPO

## 2017-01-12 ENCOUNTER — Encounter: Payer: Self-pay | Admitting: Infectious Diseases

## 2017-02-12 ENCOUNTER — Other Ambulatory Visit: Payer: Self-pay | Admitting: Infectious Diseases

## 2017-02-12 DIAGNOSIS — B20 Human immunodeficiency virus [HIV] disease: Secondary | ICD-10-CM

## 2017-02-20 ENCOUNTER — Encounter: Payer: Self-pay | Admitting: Infectious Diseases

## 2017-08-09 ENCOUNTER — Ambulatory Visit: Payer: PPO

## 2017-08-11 ENCOUNTER — Other Ambulatory Visit: Payer: Self-pay | Admitting: Infectious Diseases

## 2017-08-11 DIAGNOSIS — B20 Human immunodeficiency virus [HIV] disease: Secondary | ICD-10-CM

## 2017-08-13 ENCOUNTER — Encounter: Payer: Self-pay | Admitting: Infectious Diseases

## 2017-08-29 ENCOUNTER — Other Ambulatory Visit (HOSPITAL_COMMUNITY)
Admission: RE | Admit: 2017-08-29 | Discharge: 2017-08-29 | Disposition: A | Discharge status: Home / Self Care | Payer: PPO | Source: Ambulatory Visit | Attending: Infectious Diseases | Admitting: Infectious Diseases

## 2017-08-29 ENCOUNTER — Other Ambulatory Visit: Payer: PPO

## 2017-08-29 DIAGNOSIS — Z79899 Other long term (current) drug therapy: Secondary | ICD-10-CM

## 2017-08-29 DIAGNOSIS — Z113 Encounter for screening for infections with a predominantly sexual mode of transmission: Secondary | ICD-10-CM | POA: Diagnosis not present

## 2017-08-29 DIAGNOSIS — B20 Human immunodeficiency virus [HIV] disease: Secondary | ICD-10-CM

## 2017-08-29 NOTE — Addendum Note (Signed)
Addended byMeriel Pica F on: 08/29/2017 10:56 AM   Modules accepted: Orders

## 2017-08-30 LAB — CBC
HCT: 43.7 % (ref 38.5–50.0)
HEMOGLOBIN: 15.2 g/dL (ref 13.2–17.1)
MCH: 30.9 pg (ref 27.0–33.0)
MCHC: 34.8 g/dL (ref 32.0–36.0)
MCV: 88.8 fL (ref 80.0–100.0)
MPV: 9.9 fL (ref 7.5–12.5)
Platelets: 197 10*3/uL (ref 140–400)
RBC: 4.92 10*6/uL (ref 4.20–5.80)
RDW: 12.7 % (ref 11.0–15.0)
WBC: 5.4 10*3/uL (ref 3.8–10.8)

## 2017-08-30 LAB — COMPREHENSIVE METABOLIC PANEL
AG Ratio: 2.8 (calc) — ABNORMAL HIGH (ref 1.0–2.5)
ALT: 14 U/L (ref 9–46)
AST: 18 U/L (ref 10–35)
Albumin: 4.8 g/dL (ref 3.6–5.1)
Alkaline phosphatase (APISO): 68 U/L (ref 40–115)
BUN: 13 mg/dL (ref 7–25)
CO2: 30 mmol/L (ref 20–32)
Calcium: 9.4 mg/dL (ref 8.6–10.3)
Chloride: 104 mmol/L (ref 98–110)
Creat: 0.91 mg/dL (ref 0.70–1.25)
GLUCOSE: 96 mg/dL (ref 65–99)
Globulin: 1.7 g/dL (calc) — ABNORMAL LOW (ref 1.9–3.7)
Potassium: 4.3 mmol/L (ref 3.5–5.3)
Sodium: 141 mmol/L (ref 135–146)
TOTAL PROTEIN: 6.5 g/dL (ref 6.1–8.1)
Total Bilirubin: 1 mg/dL (ref 0.2–1.2)

## 2017-08-30 LAB — URINE CYTOLOGY ANCILLARY ONLY
CHLAMYDIA, DNA PROBE: NEGATIVE
NEISSERIA GONORRHEA: NEGATIVE

## 2017-08-30 LAB — T-HELPER CELL (CD4) - (RCID CLINIC ONLY)
CD4 % Helper T Cell: 32 % — ABNORMAL LOW (ref 33–55)
CD4 T Cell Abs: 590 /uL (ref 400–2700)

## 2017-08-30 LAB — LIPID PANEL
CHOL/HDL RATIO: 4.4 (calc) (ref <5.0)
Cholesterol: 170 mg/dL (ref <200)
HDL: 39 mg/dL — ABNORMAL LOW (ref >40)
LDL CHOLESTEROL (CALC): 106 mg/dL — AB
NON-HDL CHOLESTEROL (CALC): 131 mg/dL — AB (ref <130)
TRIGLYCERIDES: 132 mg/dL (ref <150)

## 2017-08-30 LAB — RPR: RPR: NONREACTIVE

## 2017-08-31 LAB — HIV-1 RNA QUANT-NO REFLEX-BLD
HIV 1 RNA QUANT: NOT DETECTED {copies}/mL
HIV-1 RNA Quant, Log: <1.3 Log copies/mL

## 2017-09-19 ENCOUNTER — Encounter: Payer: Self-pay | Admitting: Infectious Diseases

## 2017-09-19 ENCOUNTER — Ambulatory Visit (INDEPENDENT_AMBULATORY_CARE_PROVIDER_SITE_OTHER): Payer: PPO | Admitting: Infectious Diseases

## 2017-09-19 DIAGNOSIS — I10 Essential (primary) hypertension: Secondary | ICD-10-CM | POA: Diagnosis not present

## 2017-09-19 DIAGNOSIS — E7849 Other hyperlipidemia: Secondary | ICD-10-CM

## 2017-09-19 DIAGNOSIS — H53039 Strabismic amblyopia, unspecified eye: Secondary | ICD-10-CM

## 2017-09-19 DIAGNOSIS — B20 Human immunodeficiency virus [HIV] disease: Secondary | ICD-10-CM | POA: Diagnosis not present

## 2017-09-19 MED ORDER — ENALAPRIL-HYDROCHLOROTHIAZIDE 10-25 MG PO TABS: 1.0000 | ORAL_TABLET | Freq: Every day | ORAL | 1 refills | Status: DC

## 2017-09-19 NOTE — Assessment & Plan Note (Signed)
Will get him eye eval.

## 2017-09-19 NOTE — Assessment & Plan Note (Addendum)
Will start him on ACE-I Repeat BP 218/116 R and 194/116 on L.  He has equal pulses in his wrists.  rtc in 1-2 weeks.  He will get into PCP Sadie [facility]er, [place]?)

## 2017-09-19 NOTE — Assessment & Plan Note (Signed)
Last level normal.  Will continue to monitor.

## 2017-09-19 NOTE — Assessment & Plan Note (Signed)
He is doing well  No change in art.  Offered/refused condoms.  Will give him PSV 13 and shingrix at f/u visit if he has not gotten these

## 2017-09-19 NOTE — Progress Notes (Signed)
   Subjective:    Patient ID: Patrick [name], male    DOB: 1951/04/12, 67 y.o.   MRN: [id number]  HPI 67 yo M HIV+ on ART since 2002, hyperlipidemia.  Currently on odefsy and baby ASA, MCI, Ca citrate, Vit D.   Has had no sx from his HTN.  He has not been checking at home.  He did not get PCP as prev instructed.   No problems with ART, no missed, takes with food.   HIV 1 RNA Quant (copies/mL)  Date Value  08/29/2017 <20 NOT DETECTED  09/13/2016 <20 NOT DETECTED  10/27/2015 <20   CD4 T Cell Abs (/uL)  Date Value  08/29/2017 590  09/13/2016 510  10/27/2015 590    Review of Systems  Constitutional: Negative for appetite change and unexpected weight change.  Eyes: Positive for visual disturbance.  Respiratory: Negative for cough and shortness of breath.   Cardiovascular: Negative for chest pain.  Gastrointestinal: Negative for constipation and diarrhea.  Neurological: Negative for headaches.  Psychiatric/Behavioral: Negative for dysphoric mood and sleep disturbance.       Objective:   Physical Exam  Constitutional: He is oriented to person, place, and time. He appears well-developed and well-nourished.  HENT:  Mouth/Throat: No oropharyngeal exudate.  Eyes: Pupils are equal, round, and reactive to light. EOM are normal.  Neck: Neck supple.  Cardiovascular: Normal rate, regular rhythm and normal heart sounds.  Pulmonary/Chest: Effort normal and breath sounds normal.  Abdominal: Soft. Bowel sounds are normal. There is no tenderness. There is no rebound.  Musculoskeletal: He exhibits no edema.  Lymphadenopathy:    He has no cervical adenopathy.  Neurological: He is alert and oriented to person, place, and time.  Psychiatric: He has a normal mood and affect.          Assessment & Plan:

## 2017-09-25 ENCOUNTER — Telehealth: Payer: Self-pay

## 2017-09-25 NOTE — Telephone Encounter (Signed)
Called pt to inform them that he currently has an appt with Internal Medicine at [facility] on 10/11/17 at 10:15am.  Pt is aware of the appt gave him their number incase he needed to reschedule or had questios. Pt asked about his referral with ophthalmology will f/u with [facility]. [name]

## 2017-09-26 ENCOUNTER — Other Ambulatory Visit: Payer: Self-pay | Admitting: *Deleted

## 2017-09-26 ENCOUNTER — Ambulatory Visit: Payer: PPO

## 2017-09-26 ENCOUNTER — Other Ambulatory Visit: Payer: PPO

## 2017-09-26 VITALS — BP 163/97 | HR 67 | Temp 97.7°F | Ht 71.0 in | Wt 208.0 lb

## 2017-09-26 DIAGNOSIS — I10 Essential (primary) hypertension: Secondary | ICD-10-CM

## 2017-09-26 LAB — BASIC METABOLIC PANEL
BUN: 14 mg/dL (ref 7–25)
CALCIUM: 9.5 mg/dL (ref 8.6–10.3)
CHLORIDE: 103 mmol/L (ref 98–110)
CO2: 33 mmol/L — AB (ref 20–32)
Creat: 0.83 mg/dL (ref 0.70–1.25)
Glucose, Bld: 90 mg/dL (ref 65–99)
POTASSIUM: 4.1 mmol/L (ref 3.5–5.3)
Sodium: 141 mmol/L (ref 135–146)

## 2017-09-26 NOTE — Progress Notes (Signed)
Pt came in for nurse visit today for a blood pressure recheck.  Blood pressure was 163/97 Pulse was 67. Stated that he did not take his bp medicine this morning.  Has an appt with Internal Medicine to establish Primary Care and to follow up on BP. Patrick [name]

## 2017-10-11 ENCOUNTER — Ambulatory Visit (INDEPENDENT_AMBULATORY_CARE_PROVIDER_SITE_OTHER): Payer: PPO | Admitting: Internal Medicine

## 2017-10-11 ENCOUNTER — Other Ambulatory Visit: Payer: Self-pay

## 2017-10-11 VITALS — BP 148/82 | HR 65 | Temp 98.1°F | Ht 71.0 in | Wt 206.5 lb

## 2017-10-11 DIAGNOSIS — E7849 Other hyperlipidemia: Secondary | ICD-10-CM

## 2017-10-11 DIAGNOSIS — H510 Palsy (spasm) of conjugate gaze: Secondary | ICD-10-CM

## 2017-10-11 DIAGNOSIS — Z8719 Personal history of other diseases of the digestive system: Secondary | ICD-10-CM

## 2017-10-11 DIAGNOSIS — Z79899 Other long term (current) drug therapy: Secondary | ICD-10-CM

## 2017-10-11 DIAGNOSIS — Z0000 Encounter for general adult medical examination without abnormal findings: Secondary | ICD-10-CM

## 2017-10-11 DIAGNOSIS — Z86010 Personal history of colonic polyps: Secondary | ICD-10-CM

## 2017-10-11 DIAGNOSIS — I10 Essential (primary) hypertension: Secondary | ICD-10-CM

## 2017-10-11 DIAGNOSIS — E785 Hyperlipidemia, unspecified: Secondary | ICD-10-CM

## 2017-10-11 DIAGNOSIS — Z21 Asymptomatic human immunodeficiency virus [HIV] infection status: Secondary | ICD-10-CM | POA: Diagnosis not present

## 2017-10-11 DIAGNOSIS — B20 Human immunodeficiency virus [HIV] disease: Secondary | ICD-10-CM

## 2017-10-11 DIAGNOSIS — Z87891 Personal history of nicotine dependence: Secondary | ICD-10-CM | POA: Diagnosis not present

## 2017-10-11 MED ORDER — HYDROCHLOROTHIAZIDE 25 MG PO TABS: 25.0000 mg | ORAL_TABLET | Freq: Every day | ORAL | 1 refills | Status: DC

## 2017-10-11 MED ORDER — ENALAPRIL MALEATE 20 MG PO TABS: 20.0000 mg | ORAL_TABLET | Freq: Every day | ORAL | 1 refills | Status: DC

## 2017-10-11 NOTE — Assessment & Plan Note (Signed)
Assessment & Plan: [name] admits to history of tobacco use, both cigarettes and smokeless tobacco products but has been able to abstain since 1998. I applauded him and encouraged him to continue his great work moving forward.

## 2017-10-11 NOTE — Assessment & Plan Note (Addendum)
Assessment & Plan: Pt underwent screening colonoscopy 12/2016 with polypectomy and path report with tubular adenoma. 5-year follow-up recommended and is reflected in his HCM tab.

## 2017-10-11 NOTE — Progress Notes (Signed)
   CC: Establish care, HTN  HPI:  [name] is a 67 y.o. M with well-controlled HIV, history of tobacco use and recent diagnosis of HTN who presents today to establish with a PCP. He has no acute complaints today.   For details regarding today's visit and the status of their chronic medical issues, please refer to the assessment and plan.  Past Medical History:  Diagnosis Date  . Allergy    seasonal  . Arthritis    r knee  . H/O inguinal hernia repair   . History of tobacco use   . HIV infection ([place])   . Hyperlipidemia   . Hypertension   . Strabismic amblyopia    Surgical History: -Right inguinal hernia repair, remote -Eye, for strabismus  Family History: -Mother: Alive, in 10's. History of AFib but doing well for age -Father: Deceased in 55's from lung cancer  Social History: -Tobacco: Prior cigarette and smokeless tobacco use, quit both in 1998 -Alcohol: 4-5 beers per week -Drug: Denied recreational drug use -Single and lives alone in [name]. Mother and child live nearby  Review of Systems:   General: Denies fevers, chills, weight loss, fatigue, headache HEENT: Denies acute changes in vision, sore throat, dysphagia, odynophagia Cardiac: Denies CP, SOB Pulmonary: Denies cough Abd: Denies abdominal pain, hematochezia, melena, changes in bowels Extremities: Denies weakness or swelling  Physical Exam: General: Alert, in no acute distress. Pleasant and conversant HEENT: Disconjugate gaze. No icterus, injection or ptosis. No hoarseness or dysarthria  Cardiac: RRR, no MGR appreciated Pulmonary: CTA BL with normal WOB on RA. Able to speak in complete sentences Abd: Soft, non-tender. +bs Extremities: Warm, perfused. No pedal edema. No rashes.  Neuro: Prominent strabismus/disconjugate gaze. Facial muscles otherwise symmetric. Strength 5/5 BL UE and LE. Normal gait, walks without assistance. Psych: Anxious affect, but otherwise normal. Responds to questions  appropriately.  Vitals:   10/11/17 1025  BP: (!) 148/82  Pulse: 65  Temp: 98.1 F (36.7 C)  TempSrc: Oral  SpO2: 99%  Weight: 206 lb 8 oz (93.7 kg)  Height: 5\' 11"  (1.803 m)   Body mass index is 28.8 kg/m.  Assessment & Plan:   See Encounters Tab for problem based charting.  Patient discussed with Dr. Eppie [name]

## 2017-10-11 NOTE — Assessment & Plan Note (Signed)
Assessment: Patient hypertensive today at 148/82. He was recently started on Enalapril-HCTZ 10-25 mg daily and denies any cough, rash, dizziness or LH. He has noticed significant improvement in his blood pressures with SBP at home 130-140's. Lytes and renal function were evaluated 1 week after initiating the above medication and was unremarkable.   Plan: Will aim for tighter control of his blood pressure with goal ideally <120/80. Will increase Enalapril to 20mg  daily and continue HCTZ at 25mg  daily. Unfortunately, I could not find this in a combination pill. Patient OK with separate prescriptions at this point, although hopefully could condense once better control achieved.  -DC Enalapril-HCTZ 10-25mg  -Start Enalapril 20mg  -Start HCTZ 25mg   -RTC 3 months

## 2017-10-11 NOTE — Assessment & Plan Note (Signed)
Assessment & Plan: Lipids are checked yearly by ID specialist. Most recent total cholesterol 170, LDL 106, and HLD at 39. ASCVD risk estimated ~10%. Discussed this with patient, continues to decline statin therapy. Counseled on diet, exercise.

## 2017-10-11 NOTE — Patient Instructions (Addendum)
It was great meeting you today! I'm glad you are doing well. Today we talked about your blood pressure. Its almost at goal, but I think you would benefit from a little tighter control. I am increasing one of the components of your blood pressure medication today. Unfortunately theres not a combo-pill with that dose, so I've had to split the mediations into 2 pills. I've sent the prescriptions to your pharmacy.  Please take Enalapril 20mg  daily in addition to HCTZ 25 mg daily.   Please see [place] back in clinic in 52-months, or sooner if needed!

## 2017-10-15 NOTE — Progress Notes (Signed)
Case discussed with Dr. Molt at the time of the visit.  We reviewed the resident's history and exam and pertinent patient test results.  I agree with the assessment, diagnosis and plan of care documented in the resident's note. 

## 2017-11-13 ENCOUNTER — Ambulatory Visit (INDEPENDENT_AMBULATORY_CARE_PROVIDER_SITE_OTHER): Payer: PPO | Admitting: Infectious Diseases

## 2017-11-13 ENCOUNTER — Encounter: Payer: Self-pay | Admitting: Infectious Diseases

## 2017-11-13 VITALS — BP 160/97 | HR 65 | Temp 98.7°F | Ht 70.0 in | Wt 206.0 lb

## 2017-11-13 DIAGNOSIS — Z23 Encounter for immunization: Secondary | ICD-10-CM | POA: Diagnosis not present

## 2017-11-13 DIAGNOSIS — Z79899 Other long term (current) drug therapy: Secondary | ICD-10-CM | POA: Diagnosis not present

## 2017-11-13 DIAGNOSIS — B20 Human immunodeficiency virus [HIV] disease: Secondary | ICD-10-CM | POA: Diagnosis not present

## 2017-11-13 DIAGNOSIS — I10 Essential (primary) hypertension: Secondary | ICD-10-CM

## 2017-11-13 DIAGNOSIS — Z113 Encounter for screening for infections with a predominantly sexual mode of transmission: Secondary | ICD-10-CM

## 2017-11-13 MED ORDER — EMTRICITAB-RILPIVIR-TENOFOV AF 200-25-25 MG PO TABS: 1.0000 | ORAL_TABLET | Freq: Every day | ORAL | 3 refills | Status: DC

## 2017-11-13 NOTE — Progress Notes (Signed)
   Subjective:    Patient ID: [name], male    DOB: 1950-07-11, 67 y.o.   MRN: [id number]  HPI 67 yo M HIV+ on ART since 2002, hyperlipidemia.  Currently on odefsy and baby ASA, MCI, Ca citrate, Vit D.  BP has been irregular- 140/90something. Other days in 120s, 130s.   HIV 1 RNA Quant (copies/mL)  Date Value  08/29/2017 <20 NOT DETECTED  09/13/2016 <20 NOT DETECTED  10/27/2015 <20   CD4 T Cell Abs (/uL)  Date Value  08/29/2017 590  09/13/2016 510  10/27/2015 590    Review of Systems  Constitutional: Negative for appetite change and unexpected weight change.  Respiratory: Negative for shortness of breath.   Cardiovascular: Negative for chest pain.  Gastrointestinal: Negative for constipation and diarrhea.  Genitourinary: Negative for difficulty urinating.  Neurological: Negative for headaches.  happy about his wt.  Please see HPI. All other systems reviewed and negative.     Objective:   Physical Exam  Constitutional: He is oriented to person, place, and time. He appears well-developed and well-nourished.  HENT:  Mouth/Throat: No oropharyngeal exudate.  Eyes: Pupils are equal, round, and reactive to light. EOM are normal.  Neck: Normal range of motion. Neck supple.  Cardiovascular: Normal rate, regular rhythm and normal heart sounds.  Pulmonary/Chest: Effort normal and breath sounds normal.  Abdominal: Soft. Bowel sounds are normal. There is no tenderness. There is no guarding.  Musculoskeletal: Normal range of motion.  Lymphadenopathy:    He has no cervical adenopathy.  Neurological: He is alert and oriented to person, place, and time.  Psychiatric: He has a normal mood and affect.       Assessment & Plan:

## 2017-11-13 NOTE — Assessment & Plan Note (Signed)
His BMP is good on increased dose.  He will continue to monitor his BP at home (where he states BP are more normal).  Will f/u in 9 months.

## 2017-11-13 NOTE — Addendum Note (Signed)
Addended by: Pola Corn on: 11/13/2017 03:47 PM   Modules accepted: Orders

## 2017-11-13 NOTE — Assessment & Plan Note (Signed)
He is doing well prevnar today Will f/u in 9 months

## 2017-12-28 ENCOUNTER — Ambulatory Visit (INDEPENDENT_AMBULATORY_CARE_PROVIDER_SITE_OTHER): Payer: PPO | Admitting: Internal Medicine

## 2017-12-28 VITALS — BP 126/72 | HR 62 | Temp 99.3°F | Wt 202.0 lb

## 2017-12-28 DIAGNOSIS — R05 Cough: Secondary | ICD-10-CM

## 2017-12-28 DIAGNOSIS — Z79899 Other long term (current) drug therapy: Secondary | ICD-10-CM

## 2017-12-28 DIAGNOSIS — Z720 Tobacco use: Secondary | ICD-10-CM | POA: Diagnosis not present

## 2017-12-28 DIAGNOSIS — Z21 Asymptomatic human immunodeficiency virus [HIV] infection status: Secondary | ICD-10-CM

## 2017-12-28 DIAGNOSIS — I10 Essential (primary) hypertension: Secondary | ICD-10-CM | POA: Diagnosis not present

## 2017-12-28 DIAGNOSIS — R059 Cough, unspecified: Secondary | ICD-10-CM | POA: Insufficient documentation

## 2017-12-28 MED ORDER — OLMESARTAN MEDOXOMIL 20 MG PO TABS: 20.0000 mg | ORAL_TABLET | Freq: Every day | ORAL | 1 refills | Status: DC

## 2017-12-28 MED ORDER — HYDROCHLOROTHIAZIDE 25 MG PO TABS: 25.0000 mg | ORAL_TABLET | Freq: Every day | ORAL | 1 refills | Status: DC

## 2017-12-28 NOTE — Assessment & Plan Note (Addendum)
Assessment:  Exam reassuring but there is some faint erythema of posterior oropharynx. Patient denies sore throat, significant post-nasal drip, sinus congestion, runny nose, sneezing or odynophagia. No tonsillar enlargement, exudates or erythema. Tongue without white plaques to suggest thrush.      Plan: Cough seems to have developed shortly after starting ACE-I and will be transitioned to ARB as noted above. This could also be related to a mild seasonal allergic rhinitis although he denied most symptoms of this except for occasional post-nasal drip. Although his HIV has been well-controlled, could consider atypical causes and additional work-up if this persists.

## 2017-12-28 NOTE — Assessment & Plan Note (Addendum)
Assessment:  BP 139/76 which improved further on recheck to 126/72. BP usually in 120's-130's/70's-80's when he checks at home. No complaints other than a dry, nagging and persistent cough for the past 2-3 months or so. He wonders if it could be related to the ACE-I after reading about it online.      Plan: BP well-controlled now. His persistent cough could be related to initiating ACE-I and will transition him to equivalent ARB today. Rx for Olmesartan 20 mg sent to pharmacy. He will RTC 1-2 months for BP recheck and BMET. If well controlled, consider transition to combination pill as pt prefers to take as few medications as possible.

## 2017-12-28 NOTE — Progress Notes (Signed)
Internal Medicine Clinic Attending  Case discussed with Dr. Danford Bad at the time of the visit.  We reviewed the resident's history and exam and pertinent patient test results.  I agree with the assessment, diagnosis, and plan of care documented in the resident's note.  Agree with trial of switching ACE-I to ARB. If cough persists, consider empiric treatment for post-nasal drip and possible CXR. HIV well-controlled, low suspicion for opportunistic infections.   Lenice Pressman, M.D., Ph.D.

## 2017-12-28 NOTE — Patient Instructions (Addendum)
It was nice seeing you today. Thank you for choosing [facility] for your Primary Care.   Today we talked about:  1) Blood Pressure: I'm sorry you are having a cough. This could be related to the Enalapril. I am changing this to Olmesartan 20mg  daily. This is a comparable blood pressure medication, but bypasses the step that is associated with the ACE-Inhibitors cough.  2) I've sent this and your HCTZ to the new [facility] as we discussed. Let us know if there are any issues with this medication. 3) Please return to clinic in 1-2 months, whenever is convenient for you so we can make sure your cough is improved, your blood pressure is controlled and also to check your kidney function.   Please contact the clinic if you have any problems, or need to be seen sooner.

## 2017-12-28 NOTE — Progress Notes (Signed)
   CC: cough, follow-up of HTN  HPI:  Patrick [name] is a 67 y.o. M with well-controlled HIV who follows with [facility], HTN, remote history of tobacco use and HLD who presents today for follow-up of HTN and cough x 2-3 months.    For details regarding today's visit and the status of their chronic medical issues, please refer to the assessment and plan.  Past Medical History:  Diagnosis Date  . Allergy    seasonal  . Arthritis    r knee  . H/O inguinal hernia repair   . History of tobacco use   . HIV infection ([place])   . Hyperlipidemia   . Hypertension   . Strabismic amblyopia    Review of Systems:   General: Denies fevers, chills, weight loss HEENT: Denies acute changes in vision, sore throat, dysphagia, odynophagia, rhinorrhea, nasal or sinus congestion, headache. Cardiac: Denies CP, SOB Pulmonary: Admits to dry cough. Denies wheezes, PND Abd: Denies abdominal pain, nausea, vomiting, diarrhea  Extremities: Denies weakness or swelling  Physical Exam: General: Alert, in no acute distress. Pleasant and conversant HEENT: No icterus, injection or ptosis. No hoarseness or dysarthria. Tongue without thrush, erythema or fissure. No ulcerations appreciated. Oropharynx with mild erythema. No tonsillar enlargement, erythema or exudate.   Cardiac: RRR, no MGR appreciated Pulmonary: CTA BL with normal WOB on RA. Able to speak in complete sentences Abd: Soft, non-tender. +bs Extremities: Warm, perfused. No significant pedal edema.  Vitals:   12/28/17 1052 12/28/17 1110  BP: 139/76 126/72  Pulse: 62   Temp: 99.3 F (37.4 C)   TempSrc: Oral   SpO2: 97%   Weight: 201 lb 15.4 oz (91.6 kg)    Body mass index is 28.98 kg/m.  Assessment & Plan:   See Encounters Tab for problem based charting.  Patient discussed with Dr. Rebeca Alert

## 2017-12-31 ENCOUNTER — Telehealth: Payer: Self-pay | Admitting: Internal Medicine

## 2017-12-31 NOTE — Telephone Encounter (Signed)
Refill Request for  olmesartan (BENICAR) 20 MG tablet  Pt was seen on 12/28/2017 and pharmacist asked pt to call PCP and have it resent as they have still not rec'd it.

## 2017-12-31 NOTE — Telephone Encounter (Signed)
Confirmed with patient which pharmacy he was expecting refill sent to [facility] [address]). E-Prescribing Status: Receipt confirmed by pharmacy (12/28/2017 11:07 AM EDT) from that pharmacy. Spoke with Pharmacist who states they did not receive this Rx. Called in verbally. Hubbard Hartshorn, RN, BSN

## 2018-01-17 ENCOUNTER — Ambulatory Visit: Payer: PPO

## 2018-01-21 ENCOUNTER — Encounter: Payer: Self-pay | Admitting: Infectious Diseases

## 2018-02-27 ENCOUNTER — Other Ambulatory Visit: Payer: Self-pay

## 2018-02-27 ENCOUNTER — Ambulatory Visit (INDEPENDENT_AMBULATORY_CARE_PROVIDER_SITE_OTHER): Payer: PPO | Admitting: Internal Medicine

## 2018-02-27 VITALS — BP 154/100 | HR 76 | Temp 98.9°F | Ht 70.0 in | Wt 203.1 lb

## 2018-02-27 DIAGNOSIS — I10 Essential (primary) hypertension: Secondary | ICD-10-CM | POA: Diagnosis not present

## 2018-02-27 DIAGNOSIS — Z720 Tobacco use: Secondary | ICD-10-CM

## 2018-02-27 DIAGNOSIS — Z79899 Other long term (current) drug therapy: Secondary | ICD-10-CM

## 2018-02-27 DIAGNOSIS — E785 Hyperlipidemia, unspecified: Secondary | ICD-10-CM | POA: Diagnosis not present

## 2018-02-27 DIAGNOSIS — R631 Polydipsia: Secondary | ICD-10-CM | POA: Insufficient documentation

## 2018-02-27 DIAGNOSIS — Z21 Asymptomatic human immunodeficiency virus [HIV] infection status: Secondary | ICD-10-CM | POA: Diagnosis not present

## 2018-02-27 MED ORDER — OLMESARTAN MEDOXOMIL 40 MG PO TABS: 40.0000 mg | ORAL_TABLET | Freq: Every day | ORAL | 1 refills | Status: DC

## 2018-02-27 NOTE — Assessment & Plan Note (Signed)
Assessment: Patient reports an increased thirst since starting on anti-hypertensive medications in April, 2014. He does not have any other symptoms. I believe that this is likely due to use of the diuretic HCTZ. He does not have a history of diabetes but we will check his blood glucose today.  Plan: - Follow-up blood glucose level on BMP

## 2018-02-27 NOTE — Progress Notes (Signed)
   CC: Blood pressure check  HPI:  Patrick [name] is a 67 y.o. male with a history of HTN, HLD, HIV, tobacco use, who presents for follow-up of blood pressure.  Patient's blood pressure was well controlled on Lisinopril and HCTZ (last bp on 7/5 was 126/72). He was however experienced a persistent dry cough for the last 2-3 months. There was some concern for an ACE-I induced cough and he was subsequently switch to an angiotensin receptor blocker (Olemesartan20 mg QD) and continued on HCTZ 25 mg QD.  Since his last clinic visit 1 month ago, he has had no issues with taking his blood pressure medications. He is no longer experiencing a cough. Today's blood pressure is 154/100. He states that he took his anti-hypertensive at 0830 this morning. He says that his blood pressure runs higher in the morning "140's-150's" and decreases throughout the day "into the 130's/90's".  He does state that he has been feeling more thirst since he started taking blood pressure medication in April of this year. He denies urinary symptoms, changes in bowel movement, chest pain, SOB. He does not have a history of diabetes and his last blood glucose in March, 2019 was 96.    Past Medical History:  Diagnosis Date  . Allergy    seasonal  . Arthritis    r knee  . H/O inguinal hernia repair   . History of tobacco use   . HIV infection ([place])   . Hyperlipidemia   . Hypertension   . Strabismic amblyopia    Review of Systems: Review of Systems  Constitutional: Negative for chills and fever.  HENT: Negative for congestion and sore throat.   Respiratory: Negative for cough.   Cardiovascular: Negative for chest pain and palpitations.  Genitourinary: Negative for dysuria and hematuria.  All other systems reviewed and are negative.   Physical Exam:  Vitals:   02/27/18 1014  BP: (!) 154/100  Pulse: 76  Temp: 98.9 F (37.2 C)  TempSrc: Oral  SpO2: 99%  Weight: 203 lb 1.6 oz (92.1 kg)  Height: 5\' 10"   (1.778 m)   Physical Exam  Constitutional: He is well-developed, well-nourished, and in no distress.  HENT:  Head: Normocephalic and atraumatic.  Eyes: EOM are normal.  Cardiovascular: Normal rate, regular rhythm and normal heart sounds.  Pulmonary/Chest: Effort normal and breath sounds normal.  Musculoskeletal: Normal range of motion.  Neurological: He is alert.  Skin: Skin is warm and dry.  Psychiatric: Affect and judgment normal.  Nursing note and vitals reviewed.   Assessment & Plan:   See Encounters Tab for problem based charting.  Patient seen with Dr. Dareen Piano

## 2018-02-27 NOTE — Patient Instructions (Signed)
Please take 2 of your 20 mg Olmesartan tablets every day. I have sent a new prescription for 40 mg tablets of Olmesartan that you can pick up when you get close to running out of your other tablets.  We will call you with the results of your lab work done today.

## 2018-02-27 NOTE — Assessment & Plan Note (Signed)
Assessment: [name] is currently on Olmesartan 20 mg QD and HCTZ 25 mg QD. He is no-longer experiencing a dry cough which was likely related to ACE-inhibitor use. His blood pressure is higher than I would like and, despite the patient feeling that his blood pressure decreases throughout the day, I still worry that his blood pressure is elevated throughout the night and into the morning. I believe that we should increase his dose of Olmesartan and recheck his blood pressure in 1 month.    Plan: - Increase Olmesartan dose to 40 mg QD  - Continue HCTZ 20 mg QD - BMP ordered to day to evaluate for renal function and electrolyte abnormalities while on a new ARB. - Follow-up in 1 month for blood pressure check.

## 2018-02-28 ENCOUNTER — Telehealth: Payer: Self-pay | Admitting: Internal Medicine

## 2018-02-28 LAB — BMP8+ANION GAP
Anion Gap: 17 mmol/L (ref 10.0–18.0)
BUN/Creatinine Ratio: 12 (ref 10–24)
BUN: 11 mg/dL (ref 8–27)
CALCIUM: 9.6 mg/dL (ref 8.6–10.2)
CHLORIDE: 102 mmol/L (ref 96–106)
CO2: 26 mmol/L (ref 20–29)
CREATININE: 0.9 mg/dL (ref 0.76–1.27)
GFR calc non Af Amer: 88 mL/min/{1.73_m2} (ref >59)
GFR, EST AFRICAN AMERICAN: 102 mL/min/{1.73_m2} (ref >59)
Glucose: 92 mg/dL (ref 65–99)
Potassium: 3.6 mmol/L (ref 3.5–5.2)
Sodium: 145 mmol/L — ABNORMAL HIGH (ref 134–144)

## 2018-02-28 NOTE — Telephone Encounter (Signed)
I called [name] this morning to let him know of his BMP results which were unremarkable. We will see him in approximately 2 weeks to recheck his blood pressure.

## 2018-02-28 NOTE — Progress Notes (Signed)
I called [name] this morning to let him know of his BMP results which were unremarkable. We will see him in approximately 2 weeks to recheck his blood pressure.

## 2018-03-04 NOTE — Progress Notes (Signed)
Internal Medicine Clinic Attending  I saw and evaluated the patient.  I personally confirmed the key portions of the history and exam documented by Dr. Prince and I reviewed pertinent patient test results.  The assessment, diagnosis, and plan were formulated together and I agree with the documentation in the resident's note.  

## 2018-03-27 ENCOUNTER — Ambulatory Visit (INDEPENDENT_AMBULATORY_CARE_PROVIDER_SITE_OTHER): Payer: PPO | Admitting: Internal Medicine

## 2018-03-27 ENCOUNTER — Other Ambulatory Visit: Payer: Self-pay

## 2018-03-27 VITALS — BP 132/90 | HR 59 | Temp 98.5°F | Ht 70.0 in | Wt 207.9 lb

## 2018-03-27 DIAGNOSIS — Z79899 Other long term (current) drug therapy: Secondary | ICD-10-CM

## 2018-03-27 DIAGNOSIS — I10 Essential (primary) hypertension: Secondary | ICD-10-CM | POA: Diagnosis not present

## 2018-03-27 DIAGNOSIS — Z23 Encounter for immunization: Secondary | ICD-10-CM

## 2018-03-27 NOTE — Assessment & Plan Note (Signed)
Patient is here for blood pressure follow up. Seen one month ago, at which point his olmesartan was increased to 40 mg daily. He is also taking hydrochlorothiazide 25 mg daily. He is tolerating the increase dose well. Checks his BP at home regularly, reports readings in the 838F systolic. BP here today is at goal, 132/90. -- Continue current regimen -- Follow up PCP 6 months

## 2018-03-27 NOTE — Progress Notes (Signed)
   CC: Blood pressure follow up  HPI:  Patrick [name] is a 67 y.o. male with past medical history outlined below here for blood pressure follow up. For the details of today's visit, please refer to the assessment and plan.  Past Medical History:  Diagnosis Date  . Allergy    seasonal  . Arthritis    r knee  . H/O inguinal hernia repair   . History of tobacco use   . HIV infection ([place])   . Hyperlipidemia   . Hypertension   . Strabismic amblyopia     Review of Systems  Respiratory: Negative for shortness of breath.   Cardiovascular: Negative for chest pain.    Physical Exam:  Vitals:   03/27/18 1004 03/27/18 1026  BP: (!) 143/86 132/90  Pulse: 64 (!) 59  Temp: 98.5 F (36.9 C)   TempSrc: Oral   SpO2: 98%   Weight: 207 lb 14.4 oz (94.3 kg)   Height: 5\' 10"  (1.778 m)     Constitutional: NAD, appears comfortable Cardiovascular: RRR, no murmurs, rubs, or gallops.  Pulmonary/Chest: CTAB, no wheezes, rales, or rhonchi.  Extremities: Warm and well perfused. No edema.  Psychiatric: Normal mood and affect  Assessment & Plan:   See Encounters Tab for problem based charting.  Patient discussed with Dr. Eppie Gibson

## 2018-03-27 NOTE — Patient Instructions (Addendum)
[name],  It was a pleasure to see you today. Your blood pressure today was normal, 132/90. Keep up the good work! Please follow up with [place] again in 6 months, or sooner if you have any issues. If you have any questions or concerns, call our clinic at [phone] or after hours call [phone] and ask for the internal medicine resident on call. Thank you!  Dr. Philipp Ovens

## 2018-03-28 NOTE — Progress Notes (Signed)
Patient ID: Patrick [name], male   DOB: 01-23-1951, 67 y.o.   MRN: [id number]  Case discussed with Dr. Philipp Ovens at the time of the visit.  We reviewed the resident's history and exam and pertinent patient test results.  I agree with the assessment, diagnosis and plan of care documented in the resident's note.

## 2018-03-28 NOTE — Addendum Note (Signed)
Addended by: Oval Linsey D on: 03/28/2018 10:05 AM   Modules accepted: Level of Service

## 2018-05-30 ENCOUNTER — Telehealth: Payer: Self-pay | Admitting: Internal Medicine

## 2018-05-30 ENCOUNTER — Other Ambulatory Visit: Payer: Self-pay | Admitting: Internal Medicine

## 2018-05-30 NOTE — Telephone Encounter (Signed)
Pt stated he would like combo pill of HCTZ and Olmesartan so he does not have to take 2 separate pills. Thanks

## 2018-05-30 NOTE — Telephone Encounter (Signed)
Pt is wanting someone to call him regarding his blood pressure medicine, pt contact [phone]

## 2018-06-04 ENCOUNTER — Other Ambulatory Visit: Payer: Self-pay | Admitting: Internal Medicine

## 2018-06-04 DIAGNOSIS — I10 Essential (primary) hypertension: Secondary | ICD-10-CM

## 2018-06-04 MED ORDER — OLMESARTAN MEDOXOMIL-HCTZ 40-25 MG PO TABS: 1.0000 | ORAL_TABLET | Freq: Every day | ORAL | 2 refills | Status: DC

## 2018-06-04 NOTE — Progress Notes (Signed)
Patient would like to be switched to the combo olmesartan-HCTZ 40-25 mg QD because of pill burden. I have sent in a prescription for this medication.

## 2018-06-04 NOTE — Telephone Encounter (Signed)
I have sent in a prescription for the combo olmesartan-HCTZ 40-25 med. Thank you.

## 2018-06-04 NOTE — Telephone Encounter (Signed)
Dr Myrtie Hawk, could you please address this, pt has called this pm and would like to pick up his med, it is the combo of olmesartan and hctz. He called originally 12/5

## 2018-06-05 NOTE — Telephone Encounter (Signed)
Thank you for taking care of that.

## 2018-08-14 ENCOUNTER — Ambulatory Visit: Payer: PPO

## 2018-08-15 ENCOUNTER — Encounter: Payer: Self-pay | Admitting: Infectious Diseases

## 2018-08-28 ENCOUNTER — Other Ambulatory Visit (HOSPITAL_COMMUNITY)
Admission: RE | Admit: 2018-08-28 | Discharge: 2018-08-28 | Disposition: A | Discharge status: Home / Self Care | Payer: PPO | Source: Ambulatory Visit | Attending: Infectious Diseases | Admitting: Infectious Diseases

## 2018-08-28 ENCOUNTER — Other Ambulatory Visit: Payer: PPO

## 2018-08-28 DIAGNOSIS — Z79899 Other long term (current) drug therapy: Secondary | ICD-10-CM | POA: Diagnosis not present

## 2018-08-28 DIAGNOSIS — Z113 Encounter for screening for infections with a predominantly sexual mode of transmission: Secondary | ICD-10-CM | POA: Diagnosis not present

## 2018-08-28 DIAGNOSIS — B20 Human immunodeficiency virus [HIV] disease: Secondary | ICD-10-CM | POA: Diagnosis not present

## 2018-08-29 LAB — URINE CYTOLOGY ANCILLARY ONLY
CHLAMYDIA, DNA PROBE: NEGATIVE
Neisseria Gonorrhea: NEGATIVE

## 2018-08-29 LAB — T-HELPER CELL (CD4) - (RCID CLINIC ONLY)
CD4 T CELL ABS: 450 /uL (ref 400–2700)
CD4 T CELL HELPER: 30 % — AB (ref 33–55)

## 2018-08-30 LAB — COMPREHENSIVE METABOLIC PANEL
AG Ratio: 2.2 (calc) (ref 1.0–2.5)
ALT: 17 U/L (ref 9–46)
AST: 20 U/L (ref 10–35)
Albumin: 4.3 g/dL (ref 3.6–5.1)
Alkaline phosphatase (APISO): 66 U/L (ref 35–144)
BUN: 13 mg/dL (ref 7–25)
CO2: 33 mmol/L — ABNORMAL HIGH (ref 20–32)
Calcium: 9.4 mg/dL (ref 8.6–10.3)
Chloride: 103 mmol/L (ref 98–110)
Creat: 0.81 mg/dL (ref 0.70–1.25)
Globulin: 2 g/dL (calc) (ref 1.9–3.7)
Glucose, Bld: 90 mg/dL (ref 65–99)
Potassium: 4.5 mmol/L (ref 3.5–5.3)
Sodium: 141 mmol/L (ref 135–146)
Total Bilirubin: 0.7 mg/dL (ref 0.2–1.2)
Total Protein: 6.3 g/dL (ref 6.1–8.1)

## 2018-08-30 LAB — LIPID PANEL
Cholesterol: 165 mg/dL (ref <200)
HDL: 40 mg/dL (ref >=40)
LDL Cholesterol (Calc): 96 mg/dL (calc)
Non-HDL Cholesterol (Calc): 125 mg/dL (calc) (ref <130)
TRIGLYCERIDES: 191 mg/dL — AB (ref <150)
Total CHOL/HDL Ratio: 4.1 (calc) (ref <5.0)

## 2018-08-30 LAB — CBC
HCT: 42.9 % (ref 38.5–50.0)
Hemoglobin: 15 g/dL (ref 13.2–17.1)
MCH: 31.3 pg (ref 27.0–33.0)
MCHC: 35 g/dL (ref 32.0–36.0)
MCV: 89.6 fL (ref 80.0–100.0)
MPV: 9.3 fL (ref 7.5–12.5)
Platelets: 203 10*3/uL (ref 140–400)
RBC: 4.79 10*6/uL (ref 4.20–5.80)
RDW: 12.6 % (ref 11.0–15.0)
WBC: 5.6 10*3/uL (ref 3.8–10.8)

## 2018-08-30 LAB — HIV-1 RNA QUANT-NO REFLEX-BLD
HIV 1 RNA Quant: <20 copies/mL
HIV-1 RNA Quant, Log: <1.3 Log copies/mL

## 2018-08-30 LAB — RPR: RPR Ser Ql: NONREACTIVE

## 2018-09-05 ENCOUNTER — Other Ambulatory Visit: Payer: Self-pay

## 2018-09-05 ENCOUNTER — Ambulatory Visit (INDEPENDENT_AMBULATORY_CARE_PROVIDER_SITE_OTHER): Payer: PPO | Admitting: Internal Medicine

## 2018-09-05 ENCOUNTER — Encounter: Payer: Self-pay | Admitting: Internal Medicine

## 2018-09-05 VITALS — BP 149/88 | HR 62 | Temp 98.0°F | Wt 212.5 lb

## 2018-09-05 DIAGNOSIS — Z21 Asymptomatic human immunodeficiency virus [HIV] infection status: Secondary | ICD-10-CM | POA: Diagnosis not present

## 2018-09-05 DIAGNOSIS — Z79899 Other long term (current) drug therapy: Secondary | ICD-10-CM | POA: Diagnosis not present

## 2018-09-05 DIAGNOSIS — I10 Essential (primary) hypertension: Secondary | ICD-10-CM | POA: Diagnosis not present

## 2018-09-05 MED ORDER — OLMESARTAN-AMLODIPINE-HCTZ 40-5-25 MG PO TABS: ORAL_TABLET | ORAL | 1 refills | Status: DC

## 2018-09-05 NOTE — Addendum Note (Signed)
Addended by: Jean Rosenthal on: 09/05/2018 11:28 AM   Modules accepted: Orders

## 2018-09-05 NOTE — Patient Instructions (Signed)
Unfortunately there was a system electronic medical record shutdown during my visit with [name] however I provided verbal response regarding his blood pressure and management.

## 2018-09-05 NOTE — Assessment & Plan Note (Addendum)
Hypertension: [name] was evaluated in October 2019 for blood pressure follow-up.  His blood pressure was stable at that visit and it was measured at 132/90.  He takes combination olmesartan-hydrochlorothiazide 40-25 mg daily.  He monitors his blood pressure at home regularly and averages 140s/90s.  At the clinic today, initial BP was elevated at 165/85 with repeat of 149/88.  He is asymptomatic.  He follows up with Dr. Johnnye Sima for HIV and recently had a complete metabolic panel done.  Plan: -I am adding amlodipine 5 mg daily to his regimen. - Fortunately there is a combination olmesartan-amlodipine-hydrochlorothiazide 40-5-25 mg daily that I have prescribed. - Follow-up in clinic 1 month for blood pressure check.

## 2018-09-05 NOTE — Progress Notes (Addendum)
   CC: Follow-up hypertension  HPI:  [name] is a 68 y.o. gentleman with medical history listed below presenting to follow-up hypertension.  Please see problem charting for further details.  Past Medical History:  Diagnosis Date  . Allergy    seasonal  . Arthritis    r knee  . H/O inguinal hernia repair   . History of tobacco use   . HIV infection ([place])   . Hyperlipidemia   . Hypertension   . Strabismic amblyopia    Review of Systems: As per HPI  Physical Exam:  Blood pressure: Initially 165/85, repeat 149/88 with pulse of 62.  Physical Exam Vitals signs and nursing note reviewed.  Constitutional:      General: He is not in acute distress. HENT:     Head: Normocephalic and atraumatic.  Cardiovascular:     Rate and Rhythm: Normal rate and regular rhythm.     Heart sounds: Normal heart sounds.  Pulmonary:     Effort: Pulmonary effort is normal.     Breath sounds: Normal breath sounds. No wheezing, rhonchi or rales.  Abdominal:     General: Bowel sounds are normal.     Tenderness: There is no abdominal tenderness.  Neurological:     Mental Status: He is alert.  Psychiatric:        Mood and Affect: Mood normal.        Behavior: Behavior normal.     Assessment & Plan:   See Encounters Tab for problem based charting.  Patient discussed with Dr. Dareen Piano

## 2018-09-06 ENCOUNTER — Other Ambulatory Visit: Payer: Self-pay | Admitting: Internal Medicine

## 2018-09-06 DIAGNOSIS — I10 Essential (primary) hypertension: Secondary | ICD-10-CM

## 2018-09-06 NOTE — Progress Notes (Signed)
Internal Medicine Clinic Attending  Case discussed with Dr. Agyei at the time of the visit.  We reviewed the resident's history and exam and pertinent patient test results.  I agree with the assessment, diagnosis, and plan of care documented in the resident's note.    

## 2018-09-11 ENCOUNTER — Other Ambulatory Visit: Payer: Self-pay

## 2018-09-11 ENCOUNTER — Encounter: Payer: Self-pay | Admitting: Infectious Diseases

## 2018-09-11 ENCOUNTER — Ambulatory Visit (INDEPENDENT_AMBULATORY_CARE_PROVIDER_SITE_OTHER): Payer: PPO | Admitting: Infectious Diseases

## 2018-09-11 VITALS — BP 154/88 | HR 60 | Temp 98.1°F | Wt 209.0 lb

## 2018-09-11 DIAGNOSIS — Z79899 Other long term (current) drug therapy: Secondary | ICD-10-CM | POA: Diagnosis not present

## 2018-09-11 DIAGNOSIS — I10 Essential (primary) hypertension: Secondary | ICD-10-CM | POA: Diagnosis not present

## 2018-09-11 DIAGNOSIS — B20 Human immunodeficiency virus [HIV] disease: Secondary | ICD-10-CM

## 2018-09-11 DIAGNOSIS — Z113 Encounter for screening for infections with a predominantly sexual mode of transmission: Secondary | ICD-10-CM | POA: Diagnosis not present

## 2018-09-11 NOTE — Progress Notes (Signed)
   Subjective:    Patient ID: Patrick [name], male    DOB: 08-02-1950, 68 y.o.   MRN: [id number]  HPI 68 yo M HIV+ on ART since 2002, hyperlipidemia.  Currently on odefsy and baby ASA, Ca citrate, Vit D. BP has been irregular- at his last IM visit he was changed to omesartan-amlodipine-HCTZ pill (09-05-18).  Has been feeling well. Has not noticed his BP changing since on new rx.  No problems with ART.  Defers Tdap.   HIV 1 RNA Quant (copies/mL)  Date Value  08/28/2018 <20 NOT DETECTED  08/29/2017 <20 NOT DETECTED  09/13/2016 <20 NOT DETECTED   CD4 T Cell Abs (/uL)  Date Value  08/28/2018 450  08/29/2017 590  09/13/2016 510    Review of Systems  Constitutional: Negative for appetite change and unexpected weight change.  Respiratory: Negative for shortness of breath.   Cardiovascular: Negative for chest pain.  Gastrointestinal: Negative for constipation and diarrhea.  Genitourinary: Negative for difficulty urinating.  Neurological: Negative for headaches.  Psychiatric/Behavioral: Negative for sleep disturbance.  Please see HPI. All other systems reviewed and negative.      Objective:   Physical Exam Constitutional:      Appearance: Normal appearance.  HENT:     Mouth/Throat:     Mouth: Mucous membranes are moist.     Pharynx: Oropharynx is clear.  Eyes:     Extraocular Movements: Extraocular movements intact.     Pupils: Pupils are equal, round, and reactive to light.  Cardiovascular:     Rate and Rhythm: Normal rate and regular rhythm.  Pulmonary:     Effort: Pulmonary effort is normal.     Breath sounds: Normal breath sounds.  Abdominal:     General: Bowel sounds are normal. There is no distension.     Palpations: Abdomen is soft.     Tenderness: There is no abdominal tenderness.  Musculoskeletal: Normal range of motion.        General: No swelling.     Right lower leg: No edema.     Left lower leg: No edema.  Neurological:     General: No focal  deficit present.     Mental Status: He is alert and oriented to person, place, and time.  Psychiatric:        Mood and Affect: Mood normal.       Assessment & Plan:

## 2018-09-11 NOTE — Assessment & Plan Note (Signed)
asx Appreciate IMTS f/u

## 2018-09-11 NOTE — Assessment & Plan Note (Signed)
Doing well We discussed his labs, encouraged him that his CD4 will bounce.  vax are up to date Offered/refused condoms.  Will rtc in 1 year.

## 2018-09-30 ENCOUNTER — Other Ambulatory Visit: Payer: Self-pay | Admitting: Internal Medicine

## 2018-09-30 NOTE — Telephone Encounter (Signed)
Needs refill on blood pressure [id number] - [place], [place]  ;pt contact [phone]

## 2018-09-30 NOTE — Telephone Encounter (Signed)
Called pt - informed of 3 month supply in March. Stated he had talked to the pharmacy and everything is ok now.

## 2018-11-09 ENCOUNTER — Other Ambulatory Visit: Payer: Self-pay | Admitting: Infectious Diseases

## 2018-11-09 DIAGNOSIS — B20 Human immunodeficiency virus [HIV] disease: Secondary | ICD-10-CM

## 2018-11-29 ENCOUNTER — Other Ambulatory Visit: Payer: Self-pay | Admitting: Internal Medicine

## 2018-11-29 DIAGNOSIS — I10 Essential (primary) hypertension: Secondary | ICD-10-CM

## 2019-01-13 ENCOUNTER — Other Ambulatory Visit: Payer: Self-pay

## 2019-01-13 ENCOUNTER — Ambulatory Visit (INDEPENDENT_AMBULATORY_CARE_PROVIDER_SITE_OTHER): Payer: PPO | Admitting: Internal Medicine

## 2019-01-13 ENCOUNTER — Encounter: Payer: Self-pay | Admitting: Internal Medicine

## 2019-01-13 VITALS — BP 119/74 | HR 78 | Temp 98.0°F | Ht 70.0 in | Wt 205.3 lb

## 2019-01-13 DIAGNOSIS — I10 Essential (primary) hypertension: Secondary | ICD-10-CM

## 2019-01-13 DIAGNOSIS — Z79899 Other long term (current) drug therapy: Secondary | ICD-10-CM | POA: Diagnosis not present

## 2019-01-13 DIAGNOSIS — Z0000 Encounter for general adult medical examination without abnormal findings: Secondary | ICD-10-CM

## 2019-01-13 DIAGNOSIS — Z23 Encounter for immunization: Secondary | ICD-10-CM | POA: Diagnosis not present

## 2019-01-13 NOTE — Assessment & Plan Note (Addendum)
Received pneumococcal vaccine-23 today 01/13/2019 He declines Tdap vaccine. Also denied HbA1c. He may consider that later.

## 2019-01-13 NOTE — Progress Notes (Signed)
   CC: hypertension follow up  HPI:  [name] is a 68 y.o. with PMHx as documented below, presented for HTN follow up. Please refer to problem based charting for further details and assessment and plan of current problem and chronic medical conditions.   Past Medical History:  Diagnosis Date  . Allergy    seasonal  . Arthritis    r knee  . H/O inguinal hernia repair   . History of tobacco use   . HIV infection ([place])   . Hyperlipidemia   . Hypertension   . Strabismic amblyopia    Review of Systems:  Review of Systems  Constitutional: Negative for chills and fever.  Respiratory: Negative for cough and shortness of breath.   Cardiovascular: Negative for leg swelling.  Gastrointestinal: Negative for abdominal pain, diarrhea, nausea and vomiting.  Neurological: Negative for dizziness and headaches.    Physical Exam:  Vitals:   01/13/19 1048  BP: 119/74  Pulse: 78  Temp: 98 F (36.7 C)  TempSrc: Oral  SpO2: 99%  Weight: 205 lb 4.8 oz (93.1 kg)  Height: 5\' 10"  (1.778 m)   Physical Exam  Constitutional: He is oriented to person, place, and time. He appears well-developed and well-nourished. No distress.  Cardiovascular: Normal rate, regular rhythm and normal heart sounds.  No murmur heard. Respiratory: Effort normal and breath sounds normal. No respiratory distress. He has no wheezes. He has no rales.  GI: Soft. Bowel sounds are normal. He exhibits no distension.  Musculoskeletal:        General: No edema.  Neurological: He is alert and oriented to person, place, and time.  Skin: Skin is warm and dry.  Psychiatric: He has a normal mood and affect. His behavior is normal. Thought content normal.    Assessment & Plan:   See Encounters Tab for problem based charting.  Patient discussed with Dr. Angelia Mould

## 2019-01-13 NOTE — Patient Instructions (Signed)
It was our pleasure taking care of you in our clinic today.  You were seen for follow up. Your blood pressure is well controled today. Please take your medications as before and check your blood pressure at home and bring the log book with you next time.  You received pneumococcal vaccine -23 today. We also did a test for diabetes screening today. I will call you if the result comes back abnormal. Please come back to clinic in 6 months or earlier as needed.  Thanks, Dr. Linna Hoff

## 2019-01-13 NOTE — Assessment & Plan Note (Addendum)
BP well controlled at 119/74. He denies any chest pain, SOB, or dizziness at home.  Mentions that his BP at home ranging around 110s/60s-70s. And never lower than SBP 100s. Of note, his last BMP showed Hco3 of 33 without other lab abnormality including nl renal function. It may suggest compensation in response to respiratory acidosis. How ever he does not have Hx of COPD and denies any symptoms of OSA. Nothing to do at this point but will monitor and consider further evaluation if consistent and if needed.  -Continue current meds: Olmesartan-Amlodipine-HCTZ 40-5-25 mg QD -F/u in clinic in 6 months or sooner as needed -BMP next visit

## 2019-01-15 NOTE — Progress Notes (Signed)
Internal Medicine Clinic Attending  Case discussed with Dr. Masoudi  at the time of the visit.  We reviewed the resident's history and exam and pertinent patient test results.  I agree with the assessment, diagnosis, and plan of care documented in the resident's note.  

## 2019-01-17 ENCOUNTER — Encounter: Payer: Self-pay | Admitting: Infectious Diseases

## 2019-03-03 ENCOUNTER — Other Ambulatory Visit: Payer: Self-pay | Admitting: Internal Medicine

## 2019-03-03 DIAGNOSIS — I10 Essential (primary) hypertension: Secondary | ICD-10-CM

## 2019-05-27 ENCOUNTER — Other Ambulatory Visit: Payer: Self-pay | Admitting: Internal Medicine

## 2019-05-27 DIAGNOSIS — I10 Essential (primary) hypertension: Secondary | ICD-10-CM

## 2019-08-19 ENCOUNTER — Other Ambulatory Visit: Payer: PPO

## 2019-08-19 ENCOUNTER — Other Ambulatory Visit: Payer: Self-pay

## 2019-08-19 ENCOUNTER — Ambulatory Visit: Payer: PPO

## 2019-08-19 DIAGNOSIS — B20 Human immunodeficiency virus [HIV] disease: Secondary | ICD-10-CM

## 2019-08-19 DIAGNOSIS — Z113 Encounter for screening for infections with a predominantly sexual mode of transmission: Secondary | ICD-10-CM

## 2019-08-19 DIAGNOSIS — Z79899 Other long term (current) drug therapy: Secondary | ICD-10-CM | POA: Diagnosis not present

## 2019-08-19 NOTE — Addendum Note (Signed)
Addended by: Dolan Amen D on: 08/19/2019 09:03 AM   Modules accepted: Orders

## 2019-08-20 ENCOUNTER — Encounter: Payer: Self-pay | Admitting: Infectious Diseases

## 2019-08-20 LAB — T-HELPER CELL (CD4) - (RCID CLINIC ONLY)
CD4 % Helper T Cell: 35 % (ref 33–65)
CD4 T Cell Abs: 503 /uL (ref 400–1790)

## 2019-08-22 LAB — COMPREHENSIVE METABOLIC PANEL
AG Ratio: 2.1 (calc) (ref 1.0–2.5)
ALT: 18 U/L (ref 9–46)
AST: 19 U/L (ref 10–35)
Albumin: 4.7 g/dL (ref 3.6–5.1)
Alkaline phosphatase (APISO): 67 U/L (ref 35–144)
BUN: 11 mg/dL (ref 7–25)
CO2: 31 mmol/L (ref 20–32)
Calcium: 9.5 mg/dL (ref 8.6–10.3)
Chloride: 102 mmol/L (ref 98–110)
Creat: 0.83 mg/dL (ref 0.70–1.25)
Globulin: 2.2 g/dL (calc) (ref 1.9–3.7)
Glucose, Bld: 99 mg/dL (ref 65–99)
Potassium: 4.2 mmol/L (ref 3.5–5.3)
Sodium: 140 mmol/L (ref 135–146)
Total Bilirubin: 0.8 mg/dL (ref 0.2–1.2)
Total Protein: 6.9 g/dL (ref 6.1–8.1)

## 2019-08-22 LAB — CBC
HCT: 43.8 % (ref 38.5–50.0)
Hemoglobin: 15.1 g/dL (ref 13.2–17.1)
MCH: 31.1 pg (ref 27.0–33.0)
MCHC: 34.5 g/dL (ref 32.0–36.0)
MCV: 90.3 fL (ref 80.0–100.0)
MPV: 9.3 fL (ref 7.5–12.5)
Platelets: 198 10*3/uL (ref 140–400)
RBC: 4.85 10*6/uL (ref 4.20–5.80)
RDW: 12.5 % (ref 11.0–15.0)
WBC: 4.8 10*3/uL (ref 3.8–10.8)

## 2019-08-22 LAB — LIPID PANEL
Cholesterol: 188 mg/dL (ref <200)
HDL: 39 mg/dL — ABNORMAL LOW (ref >=40)
LDL Cholesterol (Calc): 113 mg/dL (calc) — ABNORMAL HIGH
Non-HDL Cholesterol (Calc): 149 mg/dL (calc) — ABNORMAL HIGH (ref <130)
Total CHOL/HDL Ratio: 4.8 (calc) (ref <5.0)
Triglycerides: 238 mg/dL — ABNORMAL HIGH (ref <150)

## 2019-08-22 LAB — HIV-1 RNA QUANT-NO REFLEX-BLD
HIV 1 RNA Quant: <20 copies/mL
HIV-1 RNA Quant, Log: <1.3 Log copies/mL

## 2019-08-22 LAB — RPR: RPR Ser Ql: NONREACTIVE

## 2019-08-28 ENCOUNTER — Encounter: Payer: Self-pay | Admitting: Internal Medicine

## 2019-08-28 ENCOUNTER — Ambulatory Visit (INDEPENDENT_AMBULATORY_CARE_PROVIDER_SITE_OTHER): Payer: PPO | Admitting: Internal Medicine

## 2019-08-28 ENCOUNTER — Other Ambulatory Visit: Payer: Self-pay

## 2019-08-28 ENCOUNTER — Other Ambulatory Visit: Payer: Self-pay | Admitting: Internal Medicine

## 2019-08-28 VITALS — BP 125/86 | HR 85 | Temp 97.6°F | Ht 70.0 in | Wt 212.0 lb

## 2019-08-28 DIAGNOSIS — E7800 Pure hypercholesterolemia, unspecified: Secondary | ICD-10-CM

## 2019-08-28 DIAGNOSIS — Z79899 Other long term (current) drug therapy: Secondary | ICD-10-CM

## 2019-08-28 DIAGNOSIS — I10 Essential (primary) hypertension: Secondary | ICD-10-CM | POA: Diagnosis not present

## 2019-08-28 MED ORDER — OLMESARTAN-AMLODIPINE-HCTZ 40-5-25 MG PO TABS: ORAL_TABLET | ORAL | 2 refills | Status: DC

## 2019-08-28 MED ORDER — ATORVASTATIN CALCIUM 40 MG PO TABS: 40.0000 mg | ORAL_TABLET | Freq: Every day | ORAL | 1 refills | Status: DC

## 2019-08-28 NOTE — Assessment & Plan Note (Signed)
Lipid Panel     Component Value Date/Time   CHOL 188 08/19/2019 1002   TRIG 238 (H) 08/19/2019 1002   HDL 39 (L) 08/19/2019 1002   CHOLHDL 4.8 08/19/2019 1002   VLDL 27 09/13/2016 1001   LDLCALC 113 (H) 08/19/2019 1002   ASCVD score calculated, moderate- to high-intensity statin recommended because 10-year risk >7.5% Addendum: Discussed with patient, that he will take benefit of starting him on statin.  He agrees with plan. -Lipitor 40 mg daily

## 2019-08-28 NOTE — Assessment & Plan Note (Addendum)
BP well control on current meds.  -CMP 9 days ago was unremarkable. -Continue current medications -Come back to clinic in 81-month for repeat BMP.

## 2019-08-28 NOTE — Progress Notes (Signed)
   CC: HTN f/u  HPI:  Patrick [name] is a 69 y.o. male with PMHx as documented below, presented for hypertension follow-up.  Please refer to problem based charting for further details and assessment and plan of current problem and chronic medical conditions.   Past Medical History:  Diagnosis Date  . Allergy    seasonal  . Arthritis    r knee  . H/O inguinal hernia repair   . History of tobacco use   . HIV infection ([place])   . Hyperlipidemia   . Hypertension   . Strabismic amblyopia    Review of Systems:   Constitutional: Negative for chills and fever.  Respiratory: Negative for shortness of breath.   Cardiovascular: Negative for chest pain and leg swelling.  Gastrointestinal: Negative for abdominal pain, nausea and vomiting.  Neurological: Negative for dizziness and headaches.   Physical Exam:  Vitals:   08/28/19 1533  BP: 125/86  Pulse: 85  Temp: 97.6 F (36.4 C)  TempSrc: Oral  SpO2: 99%  Weight: 212 lb (96.2 kg)  Height: 5\' 10"  (1.778 m)   Physical Exam Constitutional:      General: He is not in acute distress.    Appearance: He is not ill-appearing.  HENT:     Nose: No congestion.  Cardiovascular:     Rate and Rhythm: Normal rate and regular rhythm.     Pulses: Normal pulses.     Heart sounds: Normal heart sounds. No murmur. No friction rub. No gallop.   Pulmonary:     Effort: Pulmonary effort is normal. No respiratory distress.     Breath sounds: Normal breath sounds. No wheezing, rhonchi or rales.  Abdominal:     General: Bowel sounds are normal. There is no distension.     Palpations: Abdomen is soft. There is no mass.     Tenderness: There is no abdominal tenderness.  Musculoskeletal:        General: No swelling, tenderness or deformity.  Skin:    General: Skin is warm and dry.  Neurological:     General: No focal deficit present.     Mental Status: He is alert. Mental status is at baseline.  Psychiatric:        Mood and Affect: Mood  normal.        Behavior: Behavior normal.        Thought Content: Thought content normal.        Judgment: Judgment normal.      Assessment & Plan:   See Encounters Tab for problem based charting.  Patient discussed with Dr. Daryll Drown

## 2019-08-28 NOTE — Patient Instructions (Addendum)
It was our pleasure taking care of you in our clinic today.  You were seen due to Your blood pressure is normal. Continue your medications as before. I sent refills for your medication.   Contact us if you have any question or concern.  Please come back to clinic in 6 months or earlier if needed.  Thanks, Dr. Linna Hoff

## 2019-09-02 NOTE — Progress Notes (Signed)
Internal Medicine Clinic Attending  Case discussed with Dr. Masoudi  at the time of the visit.  We reviewed the resident's history and exam and pertinent patient test results.  I agree with the assessment, diagnosis, and plan of care documented in the resident's note.  

## 2019-09-05 ENCOUNTER — Encounter: Payer: PPO | Admitting: Infectious Diseases

## 2019-09-11 ENCOUNTER — Telehealth: Payer: Self-pay

## 2019-09-11 NOTE — Telephone Encounter (Signed)
COVID-19 Pre-Screening Questions:09/11/19   Do you currently have a fever (>100 F), chills or unexplained body aches? NO   Are you currently experiencing new cough, shortness of breath, sore throat, runny nose? NO  .  Have you recently travelled outside the state of [place] in the last 14 days? NO  .  Have you been in contact with someone that is currently pending confirmation of Covid19 testing or has been confirmed to have the [place] virus? NO **If the patient answers NO to ALL questions -  advise the patient to please call the clinic before coming to the office should any symptoms develop.

## 2019-09-12 ENCOUNTER — Encounter: Payer: Self-pay | Admitting: Infectious Diseases

## 2019-09-12 ENCOUNTER — Other Ambulatory Visit: Payer: Self-pay

## 2019-09-12 ENCOUNTER — Ambulatory Visit (INDEPENDENT_AMBULATORY_CARE_PROVIDER_SITE_OTHER): Payer: PPO | Admitting: Infectious Diseases

## 2019-09-12 VITALS — BP 142/87 | HR 63 | Temp 98.3°F | Ht 70.0 in | Wt 210.0 lb

## 2019-09-12 DIAGNOSIS — Z113 Encounter for screening for infections with a predominantly sexual mode of transmission: Secondary | ICD-10-CM | POA: Diagnosis not present

## 2019-09-12 DIAGNOSIS — Z79899 Other long term (current) drug therapy: Secondary | ICD-10-CM

## 2019-09-12 DIAGNOSIS — E7800 Pure hypercholesterolemia, unspecified: Secondary | ICD-10-CM | POA: Diagnosis not present

## 2019-09-12 DIAGNOSIS — B20 Human immunodeficiency virus [HIV] disease: Secondary | ICD-10-CM

## 2019-09-12 DIAGNOSIS — I10 Essential (primary) hypertension: Secondary | ICD-10-CM

## 2019-09-12 NOTE — Assessment & Plan Note (Signed)
asx Appreciate his PCP's f/u.

## 2019-09-12 NOTE — Assessment & Plan Note (Signed)
He is doing very well Has gotten flu vax I encouraged him to get the COVID vaccine.  Offered/refused condoms.  Will see him back in 1 yr

## 2019-09-12 NOTE — Progress Notes (Signed)
   Subjective:    Patient ID: Patrick [name], male    DOB: April 19, 1951, 69 y.o.   MRN: [id number]  HPI 69 yo M HIV+ on ART since 2002, hyperlipidemia.  Currently on odefsy and baby ASA, Ca citrate, Vit D.Lisinopril-HCTZ.  Was recently started on lipitor 40mg .  Has felt well.   HIV 1 RNA Quant (copies/mL)  Date Value  08/19/2019 <20 NOT DETECTED  08/28/2018 <20 NOT DETECTED  08/29/2017 <20 NOT DETECTED   CD4 T Cell Abs (/uL)  Date Value  08/19/2019 503  08/28/2018 450  08/29/2017 590    Review of Systems  Constitutional: Negative for appetite change, chills, fever and unexpected weight change.  Respiratory: Negative for cough and shortness of breath.   Cardiovascular: Negative for chest pain.  Gastrointestinal: Negative for constipation and diarrhea.  Genitourinary: Negative for difficulty urinating.  Psychiatric/Behavioral: Negative for sleep disturbance.       Objective:   Physical Exam Constitutional:      Appearance: Normal appearance.  HENT:     Mouth/Throat:     Mouth: Mucous membranes are moist.     Pharynx: No oropharyngeal exudate.  Eyes:     Extraocular Movements: Extraocular movements intact.     Pupils: Pupils are equal, round, and reactive to light.  Cardiovascular:     Rate and Rhythm: Normal rate and regular rhythm.  Pulmonary:     Effort: Pulmonary effort is normal.     Breath sounds: Normal breath sounds.  Abdominal:     General: Bowel sounds are normal. There is no distension.     Palpations: Abdomen is soft.     Tenderness: There is no abdominal tenderness.  Musculoskeletal:     Cervical back: Normal range of motion and neck supple.     Right lower leg: No edema.     Left lower leg: No edema.  Neurological:     General: No focal deficit present.     Mental Status: He is alert.  Psychiatric:        Mood and Affect: Mood normal.           Assessment & Plan:

## 2019-09-12 NOTE — Assessment & Plan Note (Signed)
He is on statin now.  Will need to watch his LFTs.  Appreciate f/u with his PCP.

## 2019-11-02 ENCOUNTER — Other Ambulatory Visit: Payer: Self-pay | Admitting: Infectious Diseases

## 2019-11-02 DIAGNOSIS — B20 Human immunodeficiency virus [HIV] disease: Secondary | ICD-10-CM

## 2020-01-22 ENCOUNTER — Other Ambulatory Visit: Payer: Self-pay

## 2020-01-22 ENCOUNTER — Ambulatory Visit: Payer: PPO

## 2020-01-26 ENCOUNTER — Encounter: Payer: Self-pay | Admitting: Infectious Diseases

## 2020-02-18 ENCOUNTER — Encounter: Payer: Self-pay | Admitting: Internal Medicine

## 2020-02-18 ENCOUNTER — Ambulatory Visit (INDEPENDENT_AMBULATORY_CARE_PROVIDER_SITE_OTHER): Payer: PPO | Admitting: Internal Medicine

## 2020-02-18 ENCOUNTER — Other Ambulatory Visit: Payer: Self-pay

## 2020-02-18 VITALS — BP 113/75 | HR 59 | Temp 99.0°F | Ht 70.0 in | Wt 207.2 lb

## 2020-02-18 DIAGNOSIS — E7800 Pure hypercholesterolemia, unspecified: Secondary | ICD-10-CM | POA: Diagnosis not present

## 2020-02-18 DIAGNOSIS — I10 Essential (primary) hypertension: Secondary | ICD-10-CM | POA: Diagnosis not present

## 2020-02-18 NOTE — Progress Notes (Signed)
   CC: F/u of HTN   HPI:  [name] is a 69 y.o. male with PMHx as documented below, presented for HTN follow-up.  Please refer to problem based charting for further details and assessment and plan of current problem and chronic medical conditions.  PMHx: HIV, HLD, HTN  Medications: Aspirin 81 mg daily, Lipitor 40, olmesartan-amlodipine-HCTZ 40-5-25 mg daily, Odefsey 200-20 5-25 mg daily, calcium vitamin D  Past Medical History:  Diagnosis Date  . Allergy    seasonal  . Arthritis    r knee  . H/O inguinal hernia repair   . History of tobacco use   . HIV infection ([place])   . Hyperlipidemia   . Hypertension   . Strabismic amblyopia    Review of Systems:   Constitutional: Negative for chills and fever.  Respiratory: Negative for shortness of breath.   Cardiovascular: Negative for chest pain and leg swelling.  Gastrointestinal: Negative for abdominal pain, nausea and vomiting.   Neurological: Negative for dizziness and headaches.   Physical Exam:  Vitals:   02/18/20 0911 02/18/20 1010  BP: (!) 144/79 113/75  Pulse: 77 (!) 59  Temp: 99 F (37.2 C)   SpO2: 98%   Weight: 207 lb 3.2 oz (94 kg)   Height: 5\' 10"  (1.778 m)    Constitutional: NAD Cardiovascular:  RRR, nl S1S2, no murmur,  no LEE Respiratory: Effort normal and breath sounds normal. No respiratory distress. No wheezes.  GI: Soft. Bowel sounds are normal. No distension. There is no tenderness.  Neurological: Is alert and oriented x 3  Skin: Not diaphoretic. No erythema.  Psychiatric:  Normal mood and affect. Behavior is normal. Judgment and thought content normal.    Assessment & Plan:   See Encounters Tab for problem based charting.  Patient discussed with Dr. Rebeca Alert

## 2020-02-18 NOTE — Patient Instructions (Signed)
Thank you for allowing [place] to provide your care today. Today we discussed your blood pressure and cholesterol medication. Your blood pressure today was initially elevated at 144/79 today but it improved to 113/79 (normal) when we recheck that.   I have ordered some blood work for you. I will call if any are abnormal.   For now, continue current medications.   Today we made no changes to your medications.    Please come back to clinic in 33-month or earlier as needed.  As always, if having severe symptoms, please seek medical attention at emergency room. Should you have any questions or concerns please call the internal medicine clinic at [phone].    Thank you!

## 2020-02-19 ENCOUNTER — Encounter: Payer: Self-pay | Admitting: Internal Medicine

## 2020-02-19 LAB — CMP14 + ANION GAP
ALT: 26 IU/L (ref 0–44)
AST: 25 IU/L (ref 0–40)
Albumin/Globulin Ratio: 2.6 — ABNORMAL HIGH (ref 1.2–2.2)
Albumin: 4.7 g/dL (ref 3.8–4.8)
Alkaline Phosphatase: 105 IU/L (ref 48–121)
Anion Gap: 11 mmol/L (ref 10.0–18.0)
BUN/Creatinine Ratio: 12 (ref 10–24)
BUN: 9 mg/dL (ref 8–27)
Bilirubin Total: 0.9 mg/dL (ref 0.0–1.2)
CO2: 29 mmol/L (ref 20–29)
Calcium: 9.6 mg/dL (ref 8.6–10.2)
Chloride: 101 mmol/L (ref 96–106)
Creatinine, Ser: 0.76 mg/dL (ref 0.76–1.27)
GFR calc Af Amer: 108 mL/min/{1.73_m2} (ref >59)
GFR calc non Af Amer: 93 mL/min/{1.73_m2} (ref >59)
Globulin, Total: 1.8 g/dL (ref 1.5–4.5)
Glucose: 97 mg/dL (ref 65–99)
Potassium: 4 mmol/L (ref 3.5–5.2)
Sodium: 141 mmol/L (ref 134–144)
Total Protein: 6.5 g/dL (ref 6.0–8.5)

## 2020-02-19 LAB — LIPID PANEL
Chol/HDL Ratio: 2.6 ratio (ref 0.0–5.0)
Cholesterol, Total: 113 mg/dL (ref 100–199)
HDL: 44 mg/dL (ref >39)
LDL Chol Calc (NIH): 52 mg/dL (ref 0–99)
Triglycerides: 83 mg/dL (ref 0–149)
VLDL Cholesterol Cal: 17 mg/dL (ref 5–40)

## 2020-02-19 NOTE — Assessment & Plan Note (Signed)
BP today was elevated 144/79. Improved to 113/75 when rechecked.  Patient is currently on olmesartan-amlodipine-HCTZ 40-5-20 5 mg daily BP home is 110-115/70-80s.  Well controled.  -Will continue current medication

## 2020-02-19 NOTE — Assessment & Plan Note (Signed)
Patient asks to recheck his cholesterol panel and decrease his statin dose if possible because he feels statin makes him tired and gives him some mild joint pain.  We will check lipid profile to recalculate his ASCVD risk score although probably, he will still need to be on moderate intensity statin . He denies any myalgia or myositis but if patient remains concern about muscular side effect, we can switch to hydrophilic statin for less muscular side effect.  -Lipid profile -Liver function test -Continue Lipitor 40 mg QD. May switch to Pravastatin or Rosuvastatin if concern for muscular side effect. We should monitor his liver function in this case.

## 2020-02-20 NOTE — Progress Notes (Signed)
Internal Medicine Clinic Attending  Case discussed with Dr. Masoudi at the time of the visit.  We reviewed the resident's history and exam and pertinent patient test results.  I agree with the assessment, diagnosis, and plan of care documented in the resident's note.  Michial Disney, M.D., Ph.D.  

## 2020-05-23 ENCOUNTER — Other Ambulatory Visit: Payer: Self-pay | Admitting: Internal Medicine

## 2020-05-23 DIAGNOSIS — E7800 Pure hypercholesterolemia, unspecified: Secondary | ICD-10-CM

## 2020-05-23 DIAGNOSIS — I10 Essential (primary) hypertension: Secondary | ICD-10-CM

## 2020-07-30 ENCOUNTER — Encounter: Payer: Self-pay | Admitting: *Deleted

## 2020-07-30 NOTE — Progress Notes (Signed)

## 2020-08-03 NOTE — Progress Notes (Signed)
Things That May Be Affecting Your Health:  Alcohol  Hearing loss  Pain    Depression  Home Safety  Sexual Health   Diabetes  Lack of physical activity  Stress   Difficulty with daily activities  Loneliness  Tiredness   Drug use x Medicines  Tobacco use   Falls  Motor Vehicle Safety  Weight   Food choices  Oral Health  Other    YOUR PERSONALIZED HEALTH PLAN : 1. Schedule your next subsequent Medicare Wellness visit in one year 2. Attend all of your regular appointments to address your medical issues 3. Complete the preventative screenings and services   Annual Wellness Visit   Medicare Covered Preventative Screenings and [place] Men and Women Who How Often Need? Date of Last Service Action  Abdominal Aortic Aneurysm Adults with AAA risk factors Once     Alcohol Misuse and Counseling All Adults Screening once a year if no alcohol misuse. Counseling up to 4 face to face sessions.     Bone Density Measurement  Adults at risk for osteoporosis Once every 2 yrs     Lipid Panel Z13.6 All adults without CV disease Once every 5 yrs     Colorectal Cancer   Stool sample or  Colonoscopy All adults 75 and older   Once every year  Every 10 years     Depression All Adults Once a year  Today   Diabetes Screening Blood glucose, post glucose load, or GTT Z13.1  All adults at risk  Pre-diabetics  Once per year  Twice per year     Diabetes  Self-Management Training All adults Diabetics 10 hrs first year; 2 hours subsequent years. Requires Copay     Glaucoma  Diabetics  Family history of glaucoma  African Americans 61 yrs +  Hispanic Americans 16 yrs + Annually - requires coppay     Hepatitis C Z72.89 or F19.20  High Risk for HCV  Born between 1945 and 1965  Annually  Once     HIV Z11.4 All adults based on risk  Annually btw ages 56 & 50 regardless of risk  Annually > 65 yrs if at increased risk     Lung Cancer Screening Asymptomatic adults aged  18-77 with 30 pack yr history and current smoker OR quit within the last 15 yrs Annually Must have counseling and shared decision making documentation before first screen     Medical Nutrition Therapy Adults with   Diabetes  Renal disease  Kidney transplant within past 3 yrs 3 hours first year; 2 hours subsequent years     Obesity and Counseling All adults Screening once a year Counseling if BMI 30 or higher  Today   Tobacco Use Counseling Adults who use tobacco  Up to 8 visits in one year     Vaccines Z23  Hepatitis B  Influenza   Pneumonia  Adults   Once  Once every flu season  Two different vaccines separated by one year Flu vaccine    Next Annual Wellness Visit People with Medicare Every year  Today     Services & Screenings Women Who How Often Need  Date of Last Service Action  Mammogram  Z12.31 Women over 71 One baseline ages 19-39. Annually ager 40 yrs+     Pap tests All women Annually if high risk. Every 2 yrs for normal risk women     Screening for cervical cancer with   Pap (Z01.419 nl or Z01.411abnl) &  HPV Z11.51 Women aged 11 to 88 Once every 5 yrs     Screening pelvic and breast exams All women Annually if high risk. Every 2 yrs for normal risk women     Sexually Transmitted Diseases  Chlamydia  Gonorrhea  Syphilis All at risk adults Annually for non pregnant females at increased risk         [place] Men Who How Ofter Need  Date of Last Service Action  Prostate Cancer - DRE & PSA Men over 50 Annually.  DRE might require a copay.     Sexually Transmitted Diseases  Syphilis All at risk adults Annually for men at increased risk

## 2020-09-03 ENCOUNTER — Other Ambulatory Visit: Payer: Self-pay

## 2020-09-03 ENCOUNTER — Other Ambulatory Visit: Payer: Self-pay | Admitting: Infectious Diseases

## 2020-09-03 ENCOUNTER — Other Ambulatory Visit (HOSPITAL_COMMUNITY)
Admission: RE | Admit: 2020-09-03 | Discharge: 2020-09-03 | Disposition: A | Discharge status: Home / Self Care | Payer: PPO | Source: Ambulatory Visit | Attending: Infectious Diseases | Admitting: Infectious Diseases

## 2020-09-03 ENCOUNTER — Other Ambulatory Visit: Payer: PPO

## 2020-09-03 DIAGNOSIS — B20 Human immunodeficiency virus [HIV] disease: Secondary | ICD-10-CM

## 2020-09-03 DIAGNOSIS — Z113 Encounter for screening for infections with a predominantly sexual mode of transmission: Secondary | ICD-10-CM

## 2020-09-03 DIAGNOSIS — Z79899 Other long term (current) drug therapy: Secondary | ICD-10-CM | POA: Diagnosis not present

## 2020-09-05 LAB — URINE CYTOLOGY ANCILLARY ONLY
Chlamydia: NEGATIVE
Comment: NEGATIVE
Comment: NORMAL
Neisseria Gonorrhea: NEGATIVE

## 2020-09-06 LAB — COMPREHENSIVE METABOLIC PANEL
AG Ratio: 2.2 (calc) (ref 1.0–2.5)
ALT: 23 U/L (ref 9–46)
AST: 21 U/L (ref 10–35)
Albumin: 4.3 g/dL (ref 3.6–5.1)
Alkaline phosphatase (APISO): 72 U/L (ref 35–144)
BUN: 15 mg/dL (ref 7–25)
CO2: 32 mmol/L (ref 20–32)
Calcium: 9.5 mg/dL (ref 8.6–10.3)
Chloride: 103 mmol/L (ref 98–110)
Creat: 0.79 mg/dL (ref 0.70–1.18)
Globulin: 2 g/dL (calc) (ref 1.9–3.7)
Glucose, Bld: 94 mg/dL (ref 65–99)
Potassium: 3.9 mmol/L (ref 3.5–5.3)
Sodium: 140 mmol/L (ref 135–146)
Total Bilirubin: 0.9 mg/dL (ref 0.2–1.2)
Total Protein: 6.3 g/dL (ref 6.1–8.1)

## 2020-09-06 LAB — LIPID PANEL
Cholesterol: 110 mg/dL (ref <200)
HDL: 38 mg/dL — ABNORMAL LOW (ref >=40)
LDL Cholesterol (Calc): 52 mg/dL (calc)
Non-HDL Cholesterol (Calc): 72 mg/dL (calc) (ref <130)
Total CHOL/HDL Ratio: 2.9 (calc) (ref <5.0)
Triglycerides: 122 mg/dL (ref <150)

## 2020-09-06 LAB — HIV-1 RNA QUANT-NO REFLEX-BLD
HIV 1 RNA Quant: NOT DETECTED Copies/mL
HIV-1 RNA Quant, Log: NOT DETECTED Log cps/mL

## 2020-09-06 LAB — CBC
HCT: 43.9 % (ref 38.5–50.0)
Hemoglobin: 14.4 g/dL (ref 13.2–17.1)
MCH: 31 pg (ref 27.0–33.0)
MCHC: 32.8 g/dL (ref 32.0–36.0)
MCV: 94.4 fL (ref 80.0–100.0)
MPV: 9.6 fL (ref 7.5–12.5)
Platelets: 192 10*3/uL (ref 140–400)
RBC: 4.65 10*6/uL (ref 4.20–5.80)
RDW: 13 % (ref 11.0–15.0)
WBC: 4.7 10*3/uL (ref 3.8–10.8)

## 2020-09-06 LAB — RPR: RPR Ser Ql: NONREACTIVE

## 2020-09-06 LAB — T-HELPER CELLS (CD4) COUNT (NOT AT ARMC)
Absolute CD4: 531 cells/uL (ref 490–1740)
CD4 T Helper %: 36 % (ref 30–61)
Total lymphocyte count: 1472 cells/uL (ref 850–3900)

## 2020-09-17 ENCOUNTER — Encounter: Payer: PPO | Admitting: Infectious Diseases

## 2020-09-23 ENCOUNTER — Encounter: Payer: PPO | Admitting: Infectious Diseases

## 2020-09-23 ENCOUNTER — Other Ambulatory Visit: Payer: Self-pay

## 2020-09-23 ENCOUNTER — Ambulatory Visit (INDEPENDENT_AMBULATORY_CARE_PROVIDER_SITE_OTHER): Payer: PPO | Admitting: Infectious Diseases

## 2020-09-23 VITALS — BP 113/74 | HR 66 | Temp 97.8°F | Wt 203.0 lb

## 2020-09-23 DIAGNOSIS — Z113 Encounter for screening for infections with a predominantly sexual mode of transmission: Secondary | ICD-10-CM

## 2020-09-23 DIAGNOSIS — Z79899 Other long term (current) drug therapy: Secondary | ICD-10-CM

## 2020-09-23 DIAGNOSIS — E7800 Pure hypercholesterolemia, unspecified: Secondary | ICD-10-CM | POA: Diagnosis not present

## 2020-09-23 DIAGNOSIS — I10 Essential (primary) hypertension: Secondary | ICD-10-CM

## 2020-09-23 DIAGNOSIS — B20 Human immunodeficiency virus [HIV] disease: Secondary | ICD-10-CM | POA: Diagnosis not present

## 2020-09-23 NOTE — Assessment & Plan Note (Signed)
Will continue to follow Appreciate his PCP Well controlled.

## 2020-09-23 NOTE — Assessment & Plan Note (Signed)
Much better on statin LFFTs normal Appreciate PCP f/u

## 2020-09-23 NOTE — Progress Notes (Signed)
   Subjective:    Patient ID: Patrick [name], male  DOB: Sep 04, 1950, 70 y.o.        MRN: [id number]   HPI 70yo M HIV+ on ART since 2002, hyperlipidemia.  Currently on odefsy and baby ASA, Ca citrate, Vit D.Lisinopril-HCTZ.  On lipitor 40mg . No problems with odefsy, taking with food.  Does not want COVID vax, defers survey.  Mother recently broke her shoulder requiring surgery.    HIV 1 RNA Quant  Date Value  09/03/2020 Not Detected Copies/mL  08/19/2019 <20 NOT DETECTED copies/mL  08/28/2018 <20 NOT DETECTED copies/mL   CD4 T Cell Abs (/uL)  Date Value  08/19/2019 503  08/28/2018 450  08/29/2017 590   Lab Results  Component Value Date   CHOL 110 09/03/2020   HDL 38 (L) 09/03/2020   LDLCALC 52 09/03/2020   TRIG 122 09/03/2020   CHOLHDL 2.9 09/03/2020     Health Maintenance  Topic Date Due  . COVID-19 Vaccine (1) Never done  . TETANUS/TDAP  04/30/2017  . INFLUENZA VACCINE  01/25/2020  . COLONOSCOPY (Pts 45-20yrs Insurance coverage will need to be confirmed)  01/01/2022  . Hepatitis C Screening  Completed  . PNA vac Low Risk Adult  Completed  . HPV VACCINES  Aged Out      Review of Systems  Constitutional: Negative for weight loss.  Respiratory: Negative for cough and shortness of breath.   Cardiovascular: Negative for leg swelling.  Gastrointestinal: Negative for constipation and diarrhea.  Genitourinary: Negative for dysuria.  Psychiatric/Behavioral: The patient does not have insomnia.     Please see HPI. All other systems reviewed and negative.     Objective:  Physical Exam Vitals reviewed.  Constitutional:      Appearance: Normal appearance.  HENT:     Mouth/Throat:     Mouth: Mucous membranes are moist.     Pharynx: No oropharyngeal exudate.  Eyes:     Extraocular Movements: Extraocular movements intact.     Pupils: Pupils are equal, round, and reactive to light.  Cardiovascular:     Rate and Rhythm: Normal rate and regular rhythm.   Pulmonary:     Effort: Pulmonary effort is normal.     Breath sounds: Normal breath sounds.  Abdominal:     General: Bowel sounds are normal. There is no distension.     Palpations: Abdomen is soft.     Tenderness: There is no abdominal tenderness.  Musculoskeletal:        General: Normal range of motion.     Cervical back: Normal range of motion and neck supple.     Right lower leg: No edema.     Left lower leg: No edema.  Neurological:     General: No focal deficit present.     Mental Status: He is alert.  Psychiatric:        Mood and Affect: Mood normal.            Assessment & Plan:

## 2020-09-23 NOTE — Assessment & Plan Note (Signed)
Will rtc in 1 year with labs prior He is doing very well (though needs vax which he defers) Offered/refused condoms.  No change in art, taking with food.

## 2020-10-22 ENCOUNTER — Other Ambulatory Visit: Payer: Self-pay | Admitting: Infectious Diseases

## 2020-10-22 DIAGNOSIS — B20 Human immunodeficiency virus [HIV] disease: Secondary | ICD-10-CM

## 2020-11-22 ENCOUNTER — Encounter: Payer: Self-pay | Admitting: *Deleted

## 2020-12-28 ENCOUNTER — Encounter: Payer: Self-pay | Admitting: *Deleted

## 2021-01-14 ENCOUNTER — Ambulatory Visit: Payer: PPO

## 2021-01-14 ENCOUNTER — Other Ambulatory Visit: Payer: Self-pay

## 2021-01-28 ENCOUNTER — Other Ambulatory Visit: Payer: Self-pay | Admitting: *Deleted

## 2021-01-28 DIAGNOSIS — E7800 Pure hypercholesterolemia, unspecified: Secondary | ICD-10-CM

## 2021-01-28 MED ORDER — ATORVASTATIN CALCIUM 40 MG PO TABS: ORAL_TABLET | ORAL | 2 refills | Status: DC

## 2021-02-09 ENCOUNTER — Other Ambulatory Visit: Payer: Self-pay

## 2021-02-09 DIAGNOSIS — I10 Essential (primary) hypertension: Secondary | ICD-10-CM

## 2021-02-09 MED ORDER — OLMESARTAN-AMLODIPINE-HCTZ 40-5-25 MG PO TABS: 1.0000 | ORAL_TABLET | Freq: Every day | ORAL | 2 refills | Status: DC

## 2021-02-09 NOTE — Telephone Encounter (Signed)
Pt is requesting hisOlmesartan-amLODIPine-HCTZ 40-5-25 MG TABS  sent to  [facility], [place] [address] Phone:  [phone]  Fax:  [phone]

## 2021-02-26 ENCOUNTER — Ambulatory Visit: Payer: PPO

## 2021-03-08 ENCOUNTER — Ambulatory Visit (INDEPENDENT_AMBULATORY_CARE_PROVIDER_SITE_OTHER): Payer: PPO | Admitting: Internal Medicine

## 2021-03-08 ENCOUNTER — Other Ambulatory Visit: Payer: Self-pay

## 2021-03-08 ENCOUNTER — Encounter: Payer: Self-pay | Admitting: Internal Medicine

## 2021-03-08 ENCOUNTER — Ambulatory Visit (HOSPITAL_COMMUNITY)
Admission: RE | Admit: 2021-03-08 | Discharge: 2021-03-08 | Disposition: A | Discharge status: Home / Self Care | Payer: PPO | Source: Ambulatory Visit | Attending: Internal Medicine | Admitting: Internal Medicine

## 2021-03-08 VITALS — BP 140/95 | HR 87 | Temp 98.3°F | Resp 28 | Ht 70.0 in | Wt 192.0 lb

## 2021-03-08 DIAGNOSIS — S39012A Strain of muscle, fascia and tendon of lower back, initial encounter: Secondary | ICD-10-CM | POA: Insufficient documentation

## 2021-03-08 DIAGNOSIS — S335XXA Sprain of ligaments of lumbar spine, initial encounter: Secondary | ICD-10-CM

## 2021-03-08 DIAGNOSIS — I10 Essential (primary) hypertension: Secondary | ICD-10-CM

## 2021-03-08 DIAGNOSIS — Z0000 Encounter for general adult medical examination without abnormal findings: Secondary | ICD-10-CM | POA: Diagnosis not present

## 2021-03-08 DIAGNOSIS — G8929 Other chronic pain: Secondary | ICD-10-CM | POA: Insufficient documentation

## 2021-03-08 DIAGNOSIS — M545 Low back pain, unspecified: Secondary | ICD-10-CM | POA: Insufficient documentation

## 2021-03-08 DIAGNOSIS — E7800 Pure hypercholesterolemia, unspecified: Secondary | ICD-10-CM

## 2021-03-08 DIAGNOSIS — M25552 Pain in left hip: Secondary | ICD-10-CM | POA: Diagnosis not present

## 2021-03-08 IMAGING — DX DG LUMBAR SPINE COMPLETE 4+V
5 series · 5 of 5 positions shown · non-contrast
Comparison: None.

CLINICAL DATA: Lower back pain and left hip pain for 1 week. No
injury.

EXAM:
LUMBAR SPINE - COMPLETE 4+ VIEW

[l-spine ap]
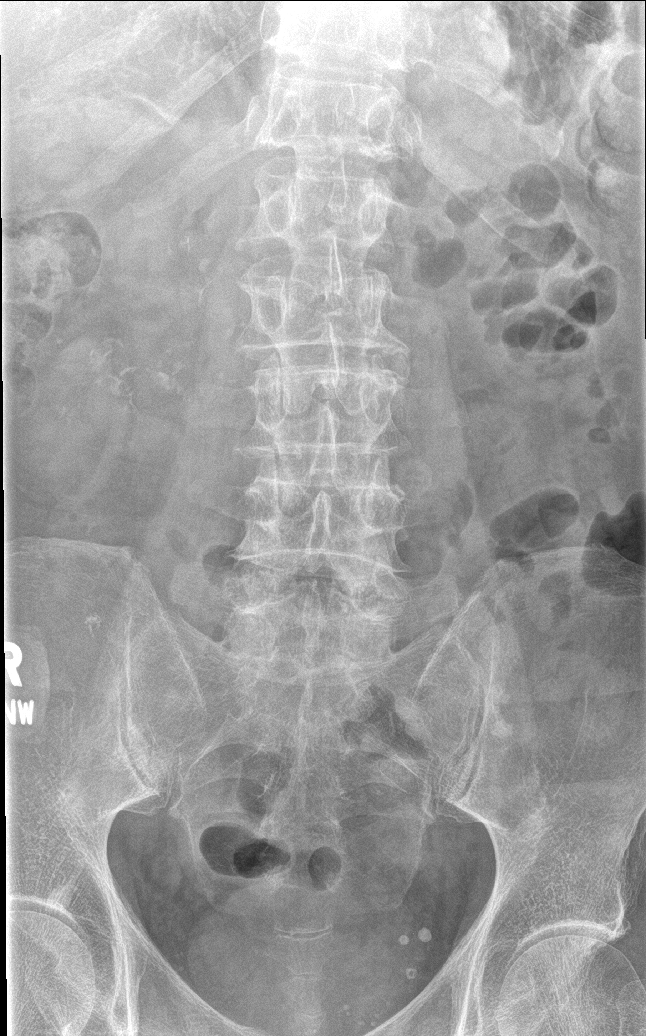

[l-spine obl (1 of 2)]
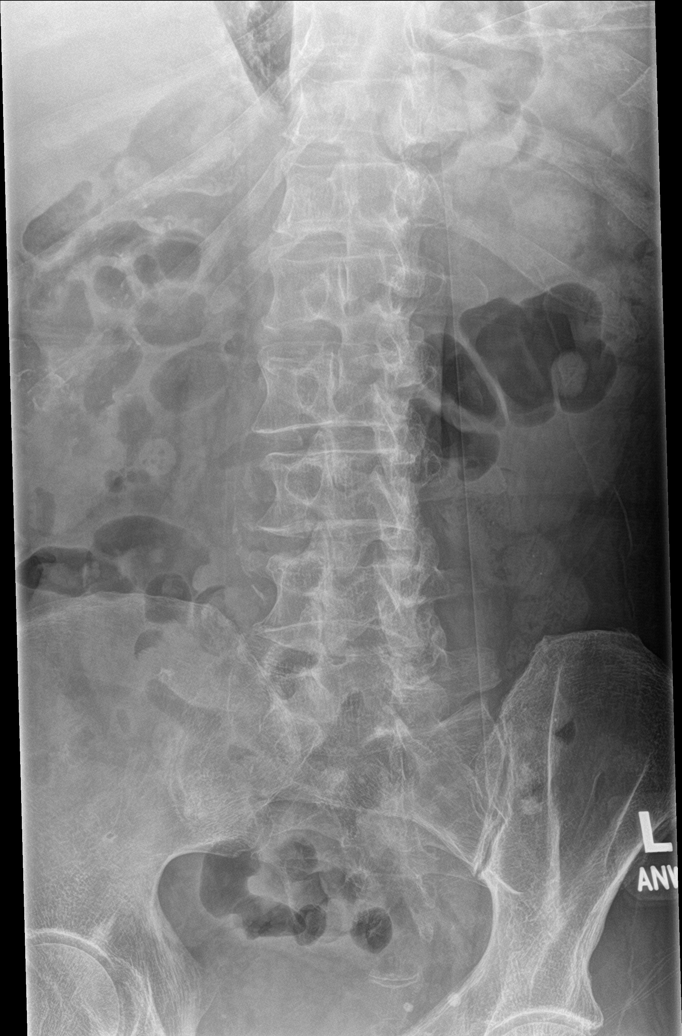

[l-spine obl (2 of 2)]
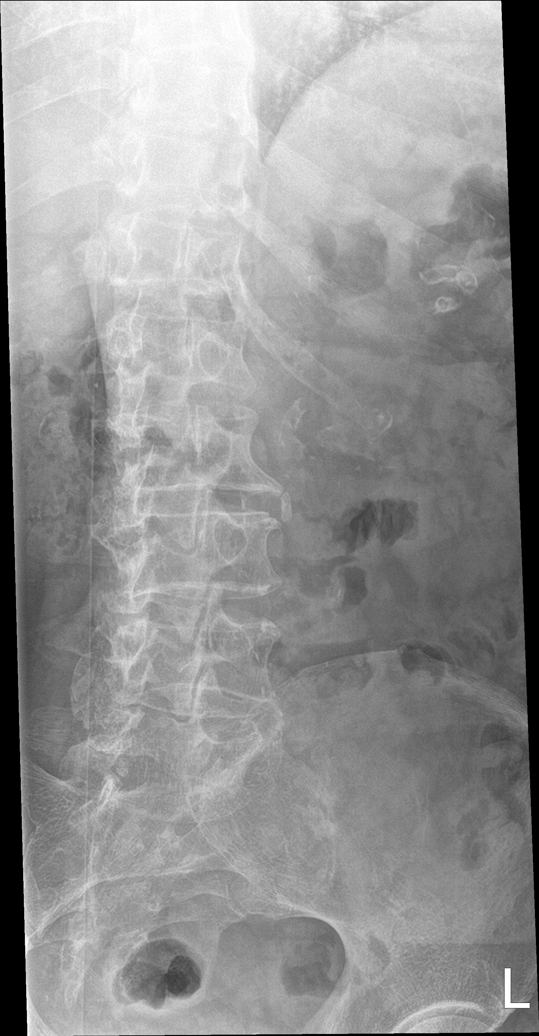

[l-spine lat]
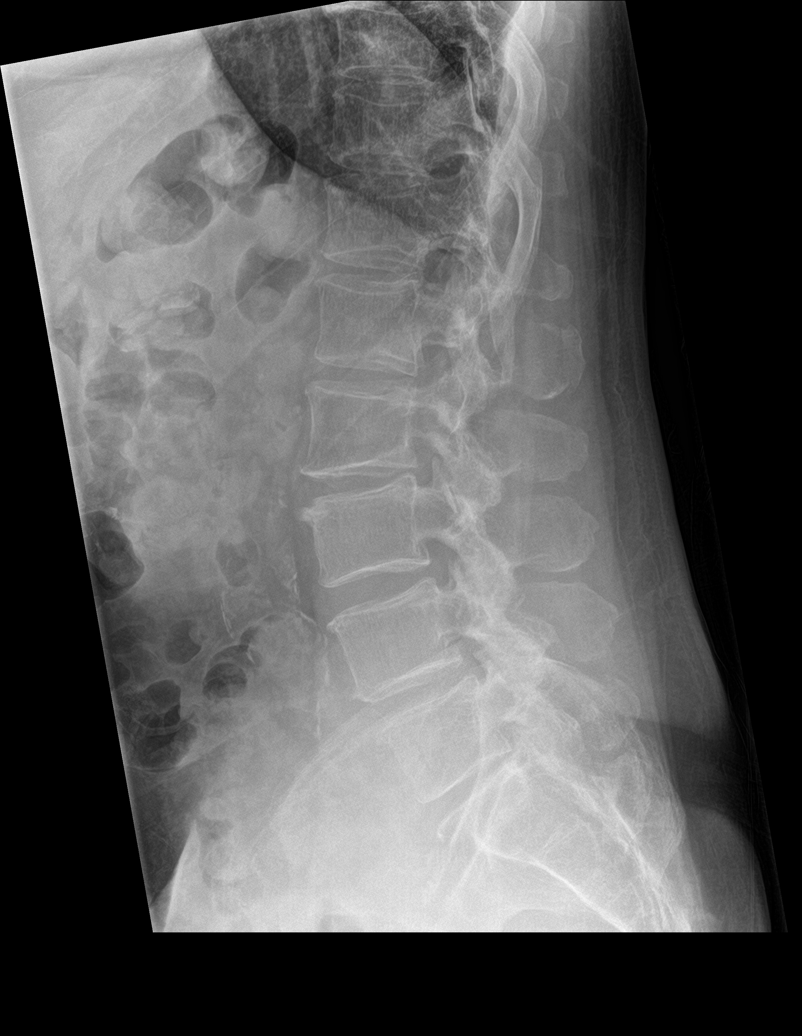

[l-spine spot]
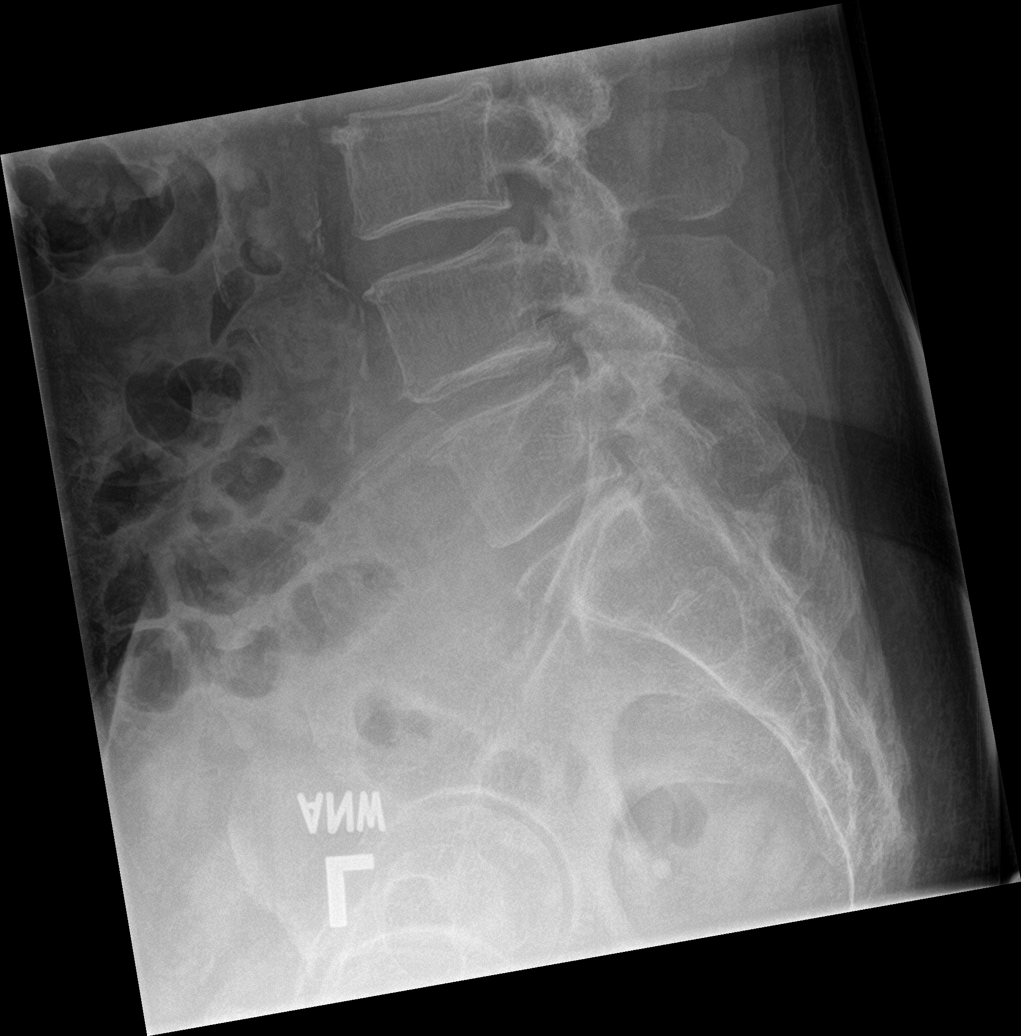

[5 of 5 positions shown; findings below may reference images not displayed]

FINDINGS: Alignment is anatomic. Vertebral body height is maintained. There
may be minimal loss of disc space height at L2-3 and L5-S1. Mild
endplate degenerative changes at L2-3. Facet hypertrophy in the mid
and lower lumbar spine. No definite pars defects.

Atherosclerotic calcification of the aorta. Infrarenal aorta
measures up to approximately 3.1 cm.
IMPRESSION: 1. L2-3 and L5-S1 degenerative disc disease.
2. Facet hypertrophy in the mid and lower lumbar spine.
3. Aortic atherosclerosis ([VE]-[VE]). Infrarenal aorta measures
up to approximately 3.1 cm.

## 2021-03-08 IMAGING — DX DG HIP (WITH OR WITHOUT PELVIS) 2-3V*L*
3 series · 3 of 3 positions shown · non-contrast
Comparison: None.

CLINICAL DATA: New left hip pain for a week.  No injury.

EXAM:
DG HIP (WITH OR WITHOUT PELVIS) 2-3V LEFT

[pelvis ap]
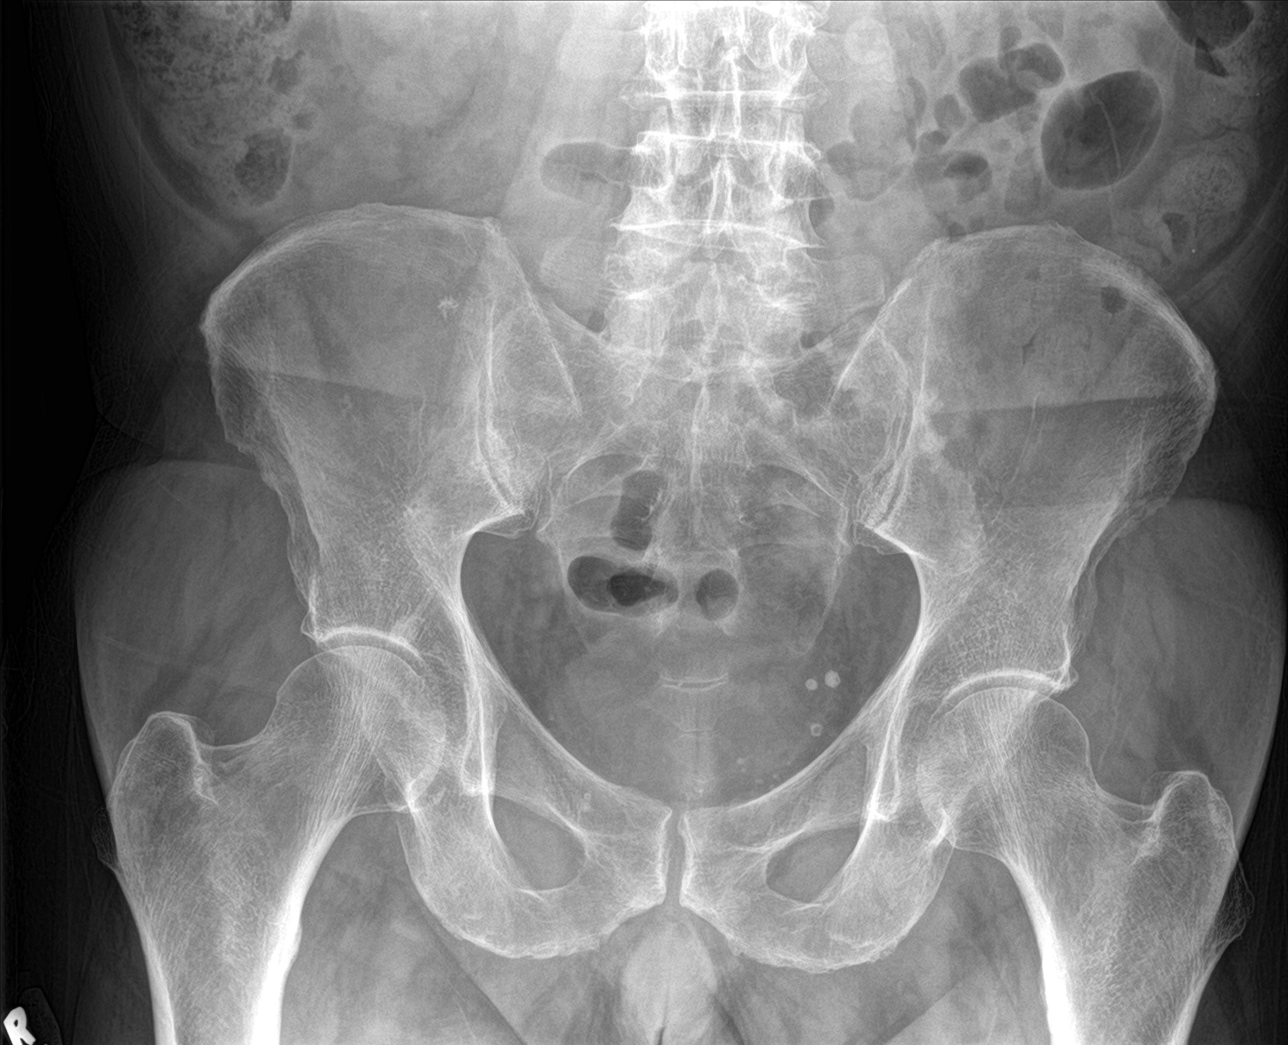

[hip ap]
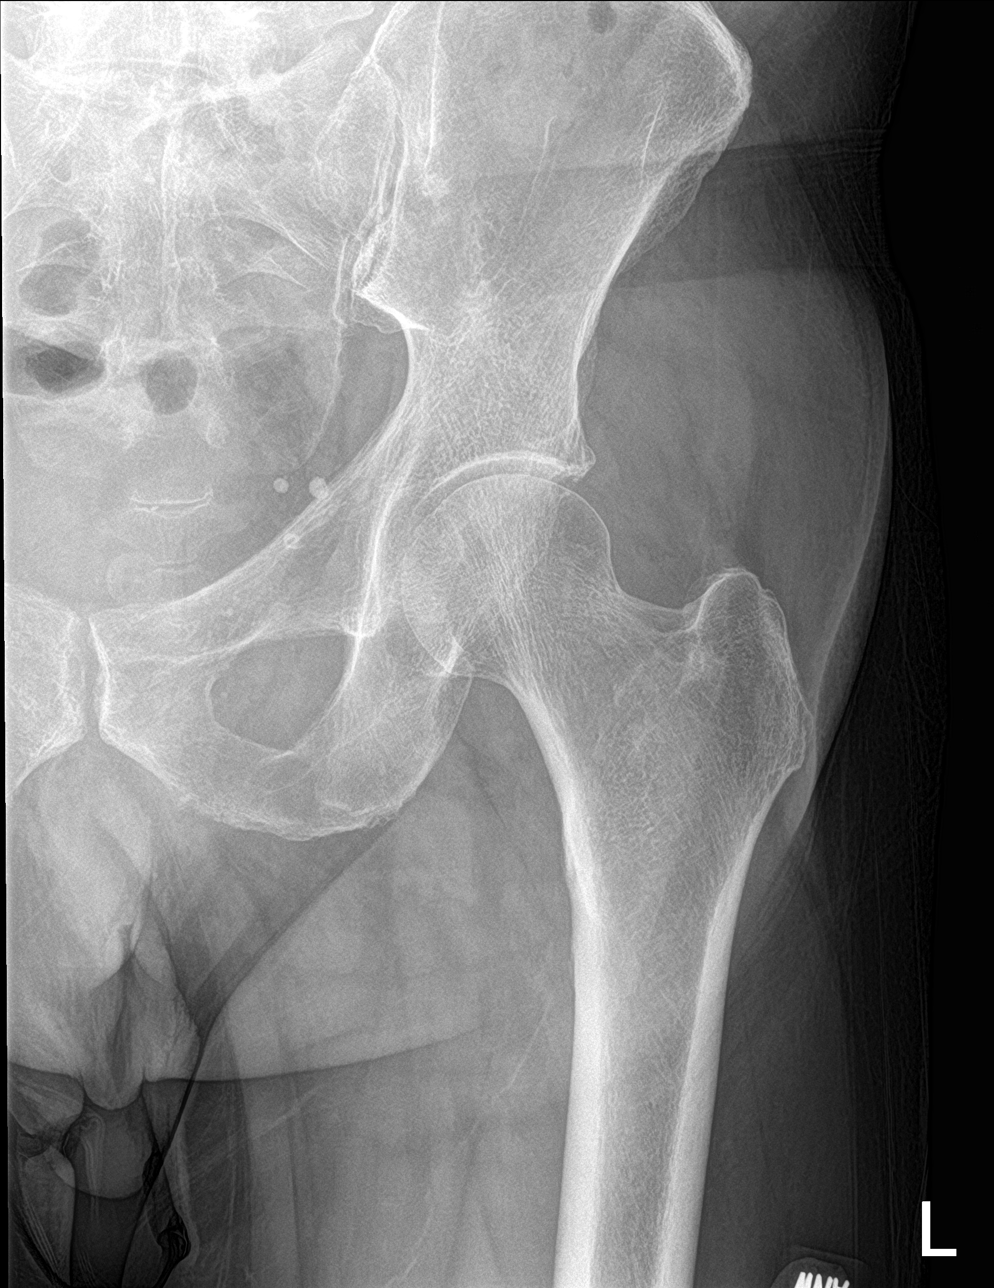

[hip lat]
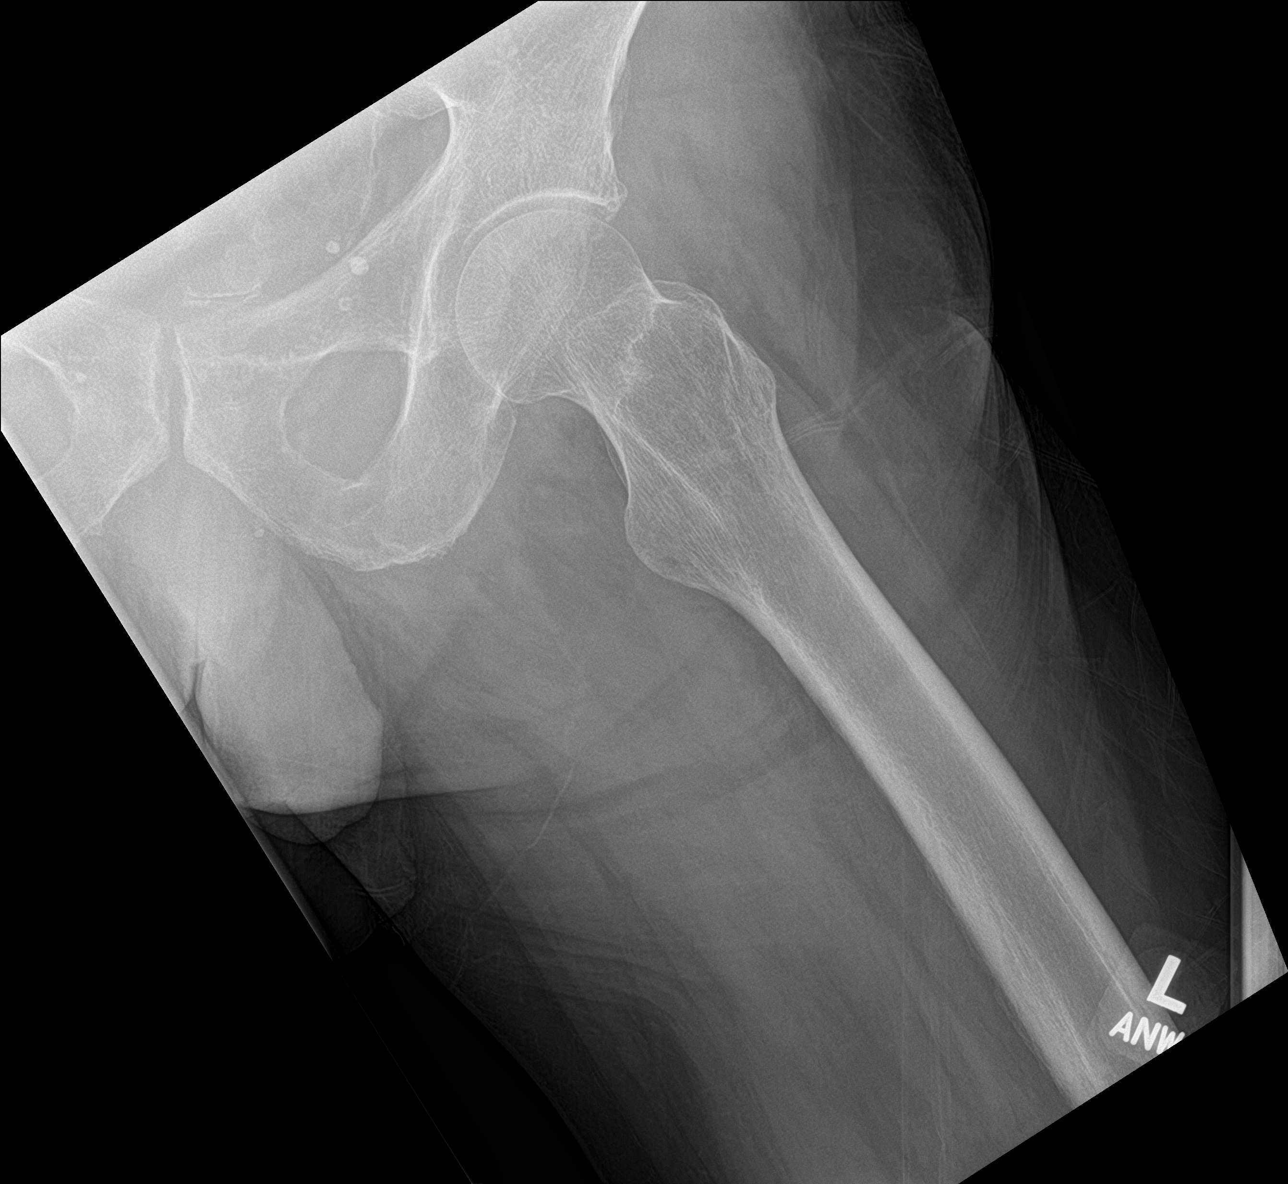

[3 of 3 positions shown; findings below may reference images not displayed]

FINDINGS: Hip joint space is uniform bilaterally. No degenerative changes.
Obturator rings are intact. Sacroiliac joints are patent.
IMPRESSION: No degenerative findings in the hips.

## 2021-03-08 MED ORDER — CYCLOBENZAPRINE HCL 5 MG PO TABS: 5.0000 mg | ORAL_TABLET | Freq: Three times a day (TID) | ORAL | 0 refills | Status: AC | PRN

## 2021-03-08 NOTE — Patient Instructions (Signed)
Thank you, [name] for allowing [place] to provide your care today. Today we discussed: Low back/hip pain: Prescribed flexeril, a muscle relaxer. You can take this up to 3 times a day for muscle spasms. We are also getting xrays of your hip and low back High choelsterol: Continue taking atorvastatin as prescribed High blood pressure: Continue olmesartan-amldoipine-hctz as prescribed  I have ordered the following labs for you:  Lab Orders  No laboratory test(s) ordered today     Tests ordered today:  Lumbar xray L hip xray  Referrals ordered today:   Referral Orders  No referral(s) requested today     I have ordered the following medication/changed the following medications:   Stop the following medications: There are no discontinued medications.   Start the following medications: Meds ordered this encounter  Medications   cyclobenzaprine (FLEXERIL) 5 MG tablet    Sig: Take 1 tablet (5 mg total) by mouth 3 (three) times daily as needed for up to 7 days for muscle spasms.    Dispense:  21 tablet    Refill:  0     Follow up:  1 month     Should you have any questions or concerns please call the internal medicine clinic at [phone].     Buddy Duty, D.O. [place]

## 2021-03-08 NOTE — Assessment & Plan Note (Signed)
Patient's lipid panel remains stable on Lipitor '40mg'$ . Will continue with this therapy

## 2021-03-08 NOTE — Assessment & Plan Note (Signed)
Declined flu shot and Tdap vaccine today

## 2021-03-08 NOTE — Progress Notes (Signed)
   CC: medication refill/new back pain  HPI:  Patrick [name] is a 70 y.o. male with HTN, HLD, and HIV who presents to the [facility] for medication refills and for back pain. Please see problem-based list for further details, assessments, and plans.   Past Medical History:  Diagnosis Date   Allergy    seasonal   Arthritis    r knee   H/O inguinal hernia repair    History of tobacco use    HIV infection ([place])    Hyperlipidemia    Hypertension    Strabismic amblyopia    Review of Systems:  Review of Systems  Constitutional:  Negative for chills and fever.  HENT:  Negative for hearing loss and sore throat.   Eyes:  Negative for blurred vision and double vision.  Respiratory:  Negative for cough, shortness of breath and wheezing.   Cardiovascular:  Negative for chest pain, palpitations and PND.  Gastrointestinal:  Negative for diarrhea, nausea and vomiting.  Genitourinary:  Negative for dysuria, frequency and urgency.  Musculoskeletal:  Positive for back pain and myalgias. Negative for falls.  Neurological:  Positive for tingling. Negative for dizziness, focal weakness and headaches.    Physical Exam:  Vitals:   03/08/21 1010  BP: (!) 140/95  Pulse: 87  Resp: (!) 28  Temp: 98.3 F (36.8 C)  TempSrc: Oral  SpO2: 97%  Weight: 192 lb (87.1 kg)  Height: '5\' 10"'$  (1.778 m)   General: No acute distress CV: RRR. No murmurs, rubs, or gallops. No LE edema Pulmonary: Lungs CTAB. Normal effort. No wheezing or rales. Abdominal: Soft, nontender, nondistended. Normal bowel sounds. Extremities: Palpable radial and DP pulses. Normal ROM. MSK: Negative straight leg test on right. Equivocal straight leg test on left (patient describes tingling in sole of his foot, but straight leg did not reciprocate tingling in his upper leg). Lumbar spine is noted to be appropriately lordotic. Normal range of motion. Patient has improvement in symptoms with flexion of lumbar spine and abduction of left  leg/hip. Tenderness to palpation localized over left latissimus dorsi and left gluteus muscles.  Skin: Warm and dry. No obvious rash or lesions. Neuro: A&Ox3. Moves all extremities. Normal sensation. 2+ patellar and achilles reflexes bilaterally.  Psych: Normal mood and affect   Assessment & Plan:   See Encounters Tab for problem based charting.  Patient seen with Dr. Jimmye Norman

## 2021-03-08 NOTE — Assessment & Plan Note (Signed)
Patient notes that he has had low back pain and left sided hip pain over the past week and a half. He states that he woke up one morning with this pain and was unable to get out of bed appropriately. Although he denies any recent injuries/falls, he does note that he has been doing a lot of house work recently and has been lifting heavy objects. He also notes that he has some numbness and tingling into the anterior aspect of his left thigh. He denies any fevers, chills, unexplained weight loss, urinary incontinence, or any other red flag symptoms. The patient has taken some aleve, which helps his pain, but it does not completely resolve it. He also notes that his pain is improved with flexion of his lumbar spine and abduction of his left leg. Walking/ambulating long distances seems to worsen his pain, and he often has to stop while walking to let his pain subside.   Plan: - Xray lumbar spine and left hip - Flexeril '5mg'$  TID PRN for muscle spasms - Advised to take Tylenol (up to 3g per day) as well for back pain in addition to Aleve - Consider ordering MRI in the future to further evaluate

## 2021-03-08 NOTE — Assessment & Plan Note (Signed)
BP elevated to 140/95 today, although the patient states that he regularly checks his BP and it is usually lower than this. He takes olmesartan-amlodipine-hctz 40-5-25 mg daily and has had no issues with this. Denies any headache, vision changes, chest pain, palpitations, or any other sxs at this time.   Plan: - Continue olmesartan-amlodipine-hctz 40-5-25 mg daily (does not need refills at this time)

## 2021-03-14 NOTE — Progress Notes (Signed)
Internal Medicine Clinic Attending ° °I saw and evaluated the patient.  I personally confirmed the key portions of the history and exam documented by Dr. Atway and I reviewed pertinent patient test results.  The assessment, diagnosis, and plan were formulated together and I agree with the documentation in the resident’s note.  °

## 2021-04-27 ENCOUNTER — Encounter: Payer: Self-pay | Admitting: Internal Medicine

## 2021-04-27 ENCOUNTER — Ambulatory Visit (INDEPENDENT_AMBULATORY_CARE_PROVIDER_SITE_OTHER): Payer: PPO | Admitting: Internal Medicine

## 2021-04-27 DIAGNOSIS — S335XXD Sprain of ligaments of lumbar spine, subsequent encounter: Secondary | ICD-10-CM

## 2021-04-27 MED ORDER — DULOXETINE HCL 60 MG PO CPEP: 60.0000 mg | ORAL_CAPSULE | Freq: Every day | ORAL | 3 refills | Status: DC

## 2021-04-27 NOTE — Progress Notes (Signed)
CC: back pain  HPI:  Patrick [name] is a 70 y.o. male with a PMHx stated below and presents today for back pain and medication refill. Please see problem based assessment and plan for additional details.   Past Medical History:  Diagnosis Date   Allergy    seasonal   Arthritis    r knee   H/O inguinal hernia repair    History of tobacco use    HIV infection ([place])    Hyperlipidemia    Hypertension    Strabismic amblyopia     Current Outpatient Medications on File Prior to Visit  Medication Sig Dispense Refill   aspirin 81 MG tablet Take 81 mg by mouth daily.     atorvastatin (LIPITOR) 40 MG tablet TAKE 1 TABLET(40 MG) BY MOUTH DAILY 90 tablet 2   calcium citrate-vitamin D (CITRACAL+D) 315-200 MG-UNIT tablet Take 1 tablet by mouth daily.     Multiple Vitamins-Minerals (MULTIVITAMIN WITH MINERALS) tablet Take 1 tablet by mouth daily.     ODEFSEY 200-25-25 MG TABS tablet TAKE 1 TABLET BY MOUTH EVERY DAY 90 tablet 2   Olmesartan-amLODIPine-HCTZ 40-5-25 MG TABS Take 1 tablet by mouth daily. 90 tablet 2   No current facility-administered medications on file prior to visit.    Family History  Problem Relation Age of Onset   Colon polyps Mother    Heart disease Mother    Heart disease Father    Kidney disease Father    Lung cancer Father    Diabetes Maternal Grandmother    Colon cancer Neg Hx     Social History   Socioeconomic History   Marital status: Single    Spouse name: Not on file   Number of children: Not on file   Years of education: Not on file   Highest education level: Not on file  Occupational History   Not on file  Tobacco Use   Smoking status: Former   Smokeless tobacco: Former    Types: Chew    Quit date: 1998  Vaping Use   Vaping Use: Never used  Substance and Sexual Activity   Alcohol use: Yes    Alcohol/week: 1.0 standard drink    Types: 1 Cans of beer per week    Comment: occ   Drug use: No   Sexual activity: Never    Comment:  pt. declined condoms  Other Topics Concern   Not on file  Social History Narrative   Not on file   Social Determinants of Health   Financial Resource Strain: Not on file  Food Insecurity: Not on file  Transportation Needs: Not on file  Physical Activity: Not on file  Stress: Not on file  Social Connections: Not on file  Intimate Partner Violence: Not on file    Review of Systems: ROS negative except for what is noted on the assessment and plan.  There were no vitals filed for this visit.   Physical Exam: Constitutional: well-appearing, comfortable appearing, in NAD HENT: normocephalic atraumatic, mucous membranes moist Cardiovascular: regular rate and rhythm, no m/r/g, non-edematous bilateral lower extremities  Pulmonary/Chest: normal work of breathing on RA, lungs clear to auscultation bilaterally Abdominal: soft, non-tender, non-distended MSK: normal bulk and tone, no focal TTP along spine. Unsteady gait which is near baseline.  Neurological: A&O x 3, 5/5 strength in bilateral upper and lower extremities, no sensory deficits; intact sensation bilaterally to light and sharp touch.  Skin: warm and dry Psych: appropriate affect   Assessment &  Plan:   See Encounters Tab for problem based charting.  Patient seen with Dr. Constance Goltz, MD  Internal Medicine Resident, PGY-1 [facility]  1:17 PM, 04/27/2021

## 2021-04-27 NOTE — Patient Instructions (Signed)
Thank you, [name] for allowing [place] to provide your care today. Today we discussed your sub-acute low back pain.  Please take the new medication Cymbalta prescribed for you. It is our goal to help you control this pain, and to help you meet your expectations. We will follow up with you in 2 months to see if further intervention is necessary.   I have ordered the following labs for you:  Lab Orders  No laboratory test(s) ordered today     Referrals ordered today:   Referral Orders  No referral(s) requested today     I have ordered the following medication/changed the following medications:   Stop the following medications: There are no discontinued medications.   Start the following medications: Meds ordered this encounter  Medications   DULoxetine (CYMBALTA) 60 MG capsule    Sig: Take 1 capsule (60 mg total) by mouth daily.    Dispense:  30 capsule    Refill:  3     Follow up: 2 months    Should you have any questions or concerns please call the internal medicine clinic at [phone].     Patrick Manes, MD  [name]

## 2021-04-27 NOTE — Assessment & Plan Note (Addendum)
Presenting with sub-acute low back pain. XR of lumbar spine with L2-3 and L5-S1 degenerative disc disease and Facet hypertrophy in the mid and lower lumbar spine. XR of hip without degenerative findings. Pt complains of pain with standing straight and temporary relief when leaning forward. He states that Flexeril did not help. There are no complaints of sensory deficits and none on exam. There is worsened gait since the onset of events x 2 months ago. No bladder loss, fevers, chills, night sweats, or weight loss. No focal tenderness on exam. His goal is to have a better QOL. We discussed at length expectations and possibility for further procedures or surgical interventions if he wants. Constellation of symptoms consistent with spinal stenosis, but no indication for further imagining at this point. Will trial duloxetine and re-assess in 2 months.   Plan - Duloxetine 60 qd - Continue using tylenol and aleve as needed  - Warm compress as tolerated/ feasible

## 2021-04-28 NOTE — Progress Notes (Signed)
Internal Medicine Clinic Attending  I saw and evaluated the patient.  I personally confirmed the key portions of the history and exam documented by Dr. Patel and I reviewed pertinent patient test results.  The assessment, diagnosis, and plan were formulated together and I agree with the documentation in the resident's note.  

## 2021-06-30 ENCOUNTER — Inpatient Hospital Stay (HOSPITAL_COMMUNITY)
Admission: EM | Admit: 2021-06-30 | Discharge: 2021-07-06 | DRG: 977 | Disposition: A | Discharge status: Skilled Nursing Facility | Payer: PPO | Attending: Student in an Organized Health Care Education/Training Program | Admitting: Student in an Organized Health Care Education/Training Program

## 2021-06-30 ENCOUNTER — Ambulatory Visit (INDEPENDENT_AMBULATORY_CARE_PROVIDER_SITE_OTHER): Payer: PPO | Admitting: Student

## 2021-06-30 ENCOUNTER — Encounter (HOSPITAL_COMMUNITY): Payer: Self-pay | Admitting: Emergency Medicine

## 2021-06-30 ENCOUNTER — Other Ambulatory Visit: Payer: Self-pay

## 2021-06-30 ENCOUNTER — Observation Stay (HOSPITAL_COMMUNITY): Payer: PPO

## 2021-06-30 VITALS — BP 88/60 | HR 84 | Temp 98.1°F | Ht 70.0 in | Wt 145.3 lb

## 2021-06-30 DIAGNOSIS — D630 Anemia in neoplastic disease: Secondary | ICD-10-CM | POA: Diagnosis present

## 2021-06-30 DIAGNOSIS — R9431 Abnormal electrocardiogram [ECG] [EKG]: Secondary | ICD-10-CM | POA: Diagnosis not present

## 2021-06-30 DIAGNOSIS — M8458XA Pathological fracture in neoplastic disease, other specified site, initial encounter for fracture: Secondary | ICD-10-CM | POA: Diagnosis present

## 2021-06-30 DIAGNOSIS — R41841 Cognitive communication deficit: Secondary | ICD-10-CM | POA: Diagnosis not present

## 2021-06-30 DIAGNOSIS — I452 Bifascicular block: Secondary | ICD-10-CM | POA: Diagnosis present

## 2021-06-30 DIAGNOSIS — M48061 Spinal stenosis, lumbar region without neurogenic claudication: Secondary | ICD-10-CM | POA: Diagnosis present

## 2021-06-30 DIAGNOSIS — C7951 Secondary malignant neoplasm of bone: Secondary | ICD-10-CM

## 2021-06-30 DIAGNOSIS — R29898 Other symptoms and signs involving the musculoskeletal system: Secondary | ICD-10-CM | POA: Diagnosis present

## 2021-06-30 DIAGNOSIS — C7989 Secondary malignant neoplasm of other specified sites: Secondary | ICD-10-CM | POA: Diagnosis present

## 2021-06-30 DIAGNOSIS — I10 Essential (primary) hypertension: Secondary | ICD-10-CM | POA: Diagnosis present

## 2021-06-30 DIAGNOSIS — E861 Hypovolemia: Secondary | ICD-10-CM | POA: Diagnosis present

## 2021-06-30 DIAGNOSIS — G8929 Other chronic pain: Secondary | ICD-10-CM | POA: Diagnosis not present

## 2021-06-30 DIAGNOSIS — Z8371 Family history of colonic polyps: Secondary | ICD-10-CM

## 2021-06-30 DIAGNOSIS — C801 Malignant (primary) neoplasm, unspecified: Secondary | ICD-10-CM | POA: Diagnosis not present

## 2021-06-30 DIAGNOSIS — Z20822 Contact with and (suspected) exposure to covid-19: Secondary | ICD-10-CM | POA: Diagnosis present

## 2021-06-30 DIAGNOSIS — E871 Hypo-osmolality and hyponatremia: Secondary | ICD-10-CM | POA: Diagnosis present

## 2021-06-30 DIAGNOSIS — E785 Hyperlipidemia, unspecified: Secondary | ICD-10-CM | POA: Diagnosis present

## 2021-06-30 DIAGNOSIS — E440 Moderate protein-calorie malnutrition: Secondary | ICD-10-CM | POA: Diagnosis present

## 2021-06-30 DIAGNOSIS — R634 Abnormal weight loss: Secondary | ICD-10-CM | POA: Diagnosis not present

## 2021-06-30 DIAGNOSIS — M5136 Other intervertebral disc degeneration, lumbar region: Secondary | ICD-10-CM | POA: Diagnosis present

## 2021-06-30 DIAGNOSIS — E860 Dehydration: Secondary | ICD-10-CM | POA: Diagnosis present

## 2021-06-30 DIAGNOSIS — N179 Acute kidney failure, unspecified: Secondary | ICD-10-CM | POA: Diagnosis present

## 2021-06-30 DIAGNOSIS — M6281 Muscle weakness (generalized): Secondary | ICD-10-CM | POA: Diagnosis not present

## 2021-06-30 DIAGNOSIS — R292 Abnormal reflex: Secondary | ICD-10-CM | POA: Diagnosis present

## 2021-06-30 DIAGNOSIS — M47816 Spondylosis without myelopathy or radiculopathy, lumbar region: Secondary | ICD-10-CM | POA: Diagnosis not present

## 2021-06-30 DIAGNOSIS — D6959 Other secondary thrombocytopenia: Secondary | ICD-10-CM | POA: Diagnosis present

## 2021-06-30 DIAGNOSIS — D638 Anemia in other chronic diseases classified elsewhere: Secondary | ICD-10-CM | POA: Insufficient documentation

## 2021-06-30 DIAGNOSIS — B20 Human immunodeficiency virus [HIV] disease: Secondary | ICD-10-CM | POA: Diagnosis present

## 2021-06-30 DIAGNOSIS — E46 Unspecified protein-calorie malnutrition: Secondary | ICD-10-CM

## 2021-06-30 DIAGNOSIS — D649 Anemia, unspecified: Secondary | ICD-10-CM | POA: Diagnosis not present

## 2021-06-30 DIAGNOSIS — G629 Polyneuropathy, unspecified: Secondary | ICD-10-CM | POA: Diagnosis present

## 2021-06-30 DIAGNOSIS — I959 Hypotension, unspecified: Secondary | ICD-10-CM | POA: Diagnosis present

## 2021-06-30 DIAGNOSIS — M898X8 Other specified disorders of bone, other site: Secondary | ICD-10-CM | POA: Diagnosis not present

## 2021-06-30 DIAGNOSIS — M6258 Muscle wasting and atrophy, not elsewhere classified, other site: Secondary | ICD-10-CM | POA: Diagnosis not present

## 2021-06-30 DIAGNOSIS — M545 Low back pain, unspecified: Secondary | ICD-10-CM

## 2021-06-30 DIAGNOSIS — I9589 Other hypotension: Secondary | ICD-10-CM | POA: Diagnosis present

## 2021-06-30 DIAGNOSIS — K5900 Constipation, unspecified: Secondary | ICD-10-CM | POA: Diagnosis present

## 2021-06-30 DIAGNOSIS — R748 Abnormal levels of other serum enzymes: Secondary | ICD-10-CM | POA: Diagnosis present

## 2021-06-30 DIAGNOSIS — M549 Dorsalgia, unspecified: Secondary | ICD-10-CM | POA: Diagnosis not present

## 2021-06-30 DIAGNOSIS — G319 Degenerative disease of nervous system, unspecified: Secondary | ICD-10-CM | POA: Diagnosis not present

## 2021-06-30 DIAGNOSIS — E876 Hypokalemia: Secondary | ICD-10-CM | POA: Diagnosis present

## 2021-06-30 DIAGNOSIS — C61 Malignant neoplasm of prostate: Secondary | ICD-10-CM | POA: Diagnosis present

## 2021-06-30 DIAGNOSIS — Z8249 Family history of ischemic heart disease and other diseases of the circulatory system: Secondary | ICD-10-CM

## 2021-06-30 DIAGNOSIS — Z833 Family history of diabetes mellitus: Secondary | ICD-10-CM

## 2021-06-30 DIAGNOSIS — Z801 Family history of malignant neoplasm of trachea, bronchus and lung: Secondary | ICD-10-CM

## 2021-06-30 DIAGNOSIS — Z789 Other specified health status: Secondary | ICD-10-CM | POA: Diagnosis not present

## 2021-06-30 DIAGNOSIS — N200 Calculus of kidney: Secondary | ICD-10-CM | POA: Diagnosis not present

## 2021-06-30 DIAGNOSIS — Z841 Family history of disorders of kidney and ureter: Secondary | ICD-10-CM

## 2021-06-30 DIAGNOSIS — Z7409 Other reduced mobility: Secondary | ICD-10-CM | POA: Diagnosis not present

## 2021-06-30 DIAGNOSIS — M8450XA Pathological fracture in neoplastic disease, unspecified site, initial encounter for fracture: Secondary | ICD-10-CM

## 2021-06-30 DIAGNOSIS — J9811 Atelectasis: Secondary | ICD-10-CM | POA: Diagnosis not present

## 2021-06-30 DIAGNOSIS — Z79899 Other long term (current) drug therapy: Secondary | ICD-10-CM

## 2021-06-30 LAB — BASIC METABOLIC PANEL
Anion gap: 10 (ref 5–15)
BUN: 31 mg/dL — ABNORMAL HIGH (ref 8–23)
CO2: 23 mmol/L (ref 22–32)
Calcium: 7.8 mg/dL — ABNORMAL LOW (ref 8.9–10.3)
Chloride: 92 mmol/L — ABNORMAL LOW (ref 98–111)
Creatinine, Ser: 1.18 mg/dL (ref 0.61–1.24)
GFR, Estimated: >60 mL/min (ref >60)
Glucose, Bld: 114 mg/dL — ABNORMAL HIGH (ref 70–99)
Potassium: 2.7 mmol/L — CL (ref 3.5–5.1)
Sodium: 125 mmol/L — ABNORMAL LOW (ref 135–145)

## 2021-06-30 LAB — COMPREHENSIVE METABOLIC PANEL
ALT: 22 U/L (ref 0–44)
AST: 51 U/L — ABNORMAL HIGH (ref 15–41)
Albumin: 2.7 g/dL — ABNORMAL LOW (ref 3.5–5.0)
Alkaline Phosphatase: 702 U/L — ABNORMAL HIGH (ref 38–126)
Anion gap: 10 (ref 5–15)
BUN: 33 mg/dL — ABNORMAL HIGH (ref 8–23)
CO2: 25 mmol/L (ref 22–32)
Calcium: 8.3 mg/dL — ABNORMAL LOW (ref 8.9–10.3)
Chloride: 89 mmol/L — ABNORMAL LOW (ref 98–111)
Creatinine, Ser: 1.51 mg/dL — ABNORMAL HIGH (ref 0.61–1.24)
GFR, Estimated: 49 mL/min — ABNORMAL LOW (ref >60)
Glucose, Bld: 142 mg/dL — ABNORMAL HIGH (ref 70–99)
Potassium: 3.4 mmol/L — ABNORMAL LOW (ref 3.5–5.1)
Sodium: 124 mmol/L — ABNORMAL LOW (ref 135–145)
Total Bilirubin: 1.2 mg/dL (ref 0.3–1.2)
Total Protein: 5.9 g/dL — ABNORMAL LOW (ref 6.5–8.1)

## 2021-06-30 LAB — CBC WITH DIFFERENTIAL/PLATELET
Abs Immature Granulocytes: 0.4 10*3/uL — ABNORMAL HIGH (ref 0.00–0.07)
Band Neutrophils: 23 %
Basophils Absolute: 0 10*3/uL (ref 0.0–0.1)
Basophils Relative: 0 %
Eosinophils Absolute: 0.1 10*3/uL (ref 0.0–0.5)
Eosinophils Relative: 2 %
HCT: 23.6 % — ABNORMAL LOW (ref 39.0–52.0)
Hemoglobin: 7.9 g/dL — ABNORMAL LOW (ref 13.0–17.0)
Lymphocytes Relative: 19 %
Lymphs Abs: 1.2 10*3/uL (ref 0.7–4.0)
MCH: 30 pg (ref 26.0–34.0)
MCHC: 33.5 g/dL (ref 30.0–36.0)
MCV: 89.7 fL (ref 80.0–100.0)
Metamyelocytes Relative: 3 %
Monocytes Absolute: 0.2 10*3/uL (ref 0.1–1.0)
Monocytes Relative: 4 %
Myelocytes: 4 %
Neutro Abs: 4.1 10*3/uL (ref 1.7–7.7)
Neutrophils Relative %: 45 %
Platelets: 148 10*3/uL — ABNORMAL LOW (ref 150–400)
RBC: 2.63 MIL/uL — ABNORMAL LOW (ref 4.22–5.81)
RDW: 14.4 % (ref 11.5–15.5)
WBC: 6.1 10*3/uL (ref 4.0–10.5)
nRBC: 4.4 % — ABNORMAL HIGH (ref 0.0–0.2)

## 2021-06-30 LAB — CBC
HCT: 27 % — ABNORMAL LOW (ref 39.0–52.0)
Hemoglobin: 8.9 g/dL — ABNORMAL LOW (ref 13.0–17.0)
MCH: 29.7 pg (ref 26.0–34.0)
MCHC: 33 g/dL (ref 30.0–36.0)
MCV: 90 fL (ref 80.0–100.0)
Platelets: 146 10*3/uL — ABNORMAL LOW (ref 150–400)
RBC: 3 MIL/uL — ABNORMAL LOW (ref 4.22–5.81)
RDW: 14.3 % (ref 11.5–15.5)
WBC: 7 10*3/uL (ref 4.0–10.5)
nRBC: 1.9 % — ABNORMAL HIGH (ref 0.0–0.2)

## 2021-06-30 LAB — VITAMIN B12: Vitamin B-12: 1539 pg/mL — ABNORMAL HIGH (ref 180–914)

## 2021-06-30 LAB — RETICULOCYTES
Immature Retic Fract: 32.6 % — ABNORMAL HIGH (ref 2.3–15.9)
RBC.: 2.81 MIL/uL — ABNORMAL LOW (ref 4.22–5.81)
Retic Count, Absolute: 45.5 10*3/uL (ref 19.0–186.0)
Retic Ct Pct: 1.6 % (ref 0.4–3.1)

## 2021-06-30 LAB — FOLATE
Folate: 12.6 ng/mL (ref >5.9)
Folate: 10.9 ng/mL (ref >5.9)

## 2021-06-30 LAB — ABO/RH: ABO/RH(D): A NEG

## 2021-06-30 LAB — HEMOGLOBIN A1C
Hgb A1c MFr Bld: 5.6 % (ref 4.8–5.6)
Mean Plasma Glucose: 114.02 mg/dL

## 2021-06-30 LAB — TSH: TSH: 1.828 u[IU]/mL (ref 0.350–4.500)

## 2021-06-30 LAB — FERRITIN: Ferritin: 3580 ng/mL — ABNORMAL HIGH (ref 24–336)

## 2021-06-30 LAB — RESP PANEL BY RT-PCR (FLU A&B, COVID) ARPGX2
Influenza A by PCR: NEGATIVE
Influenza B by PCR: NEGATIVE
SARS Coronavirus 2 by RT PCR: NEGATIVE

## 2021-06-30 LAB — MAGNESIUM: Magnesium: 1.9 mg/dL (ref 1.7–2.4)

## 2021-06-30 LAB — POC OCCULT BLOOD, ED: Fecal Occult Bld: NEGATIVE

## 2021-06-30 MED ORDER — LACTATED RINGERS IV BOLUS
1000.0000 mL | Freq: Once | INTRAVENOUS | Status: AC
  Administered 2021-07-01: 1000 mL via INTRAVENOUS

## 2021-06-30 MED ORDER — POLYETHYLENE GLYCOL 3350 17 G PO PACK
17.0000 g | PACK | Freq: Every day | ORAL | Status: DC
  Administered 2021-07-01 – 2021-07-02 (×2): 17 g via ORAL
  Filled 2021-06-30 (×6): qty 1

## 2021-06-30 MED ORDER — SODIUM CHLORIDE 0.9 % IV BOLUS
1000.0000 mL | Freq: Once | INTRAVENOUS | Status: DC
  Administered 2021-06-30: 1000 mL via INTRAVENOUS

## 2021-06-30 MED ORDER — ACETAMINOPHEN 325 MG PO TABS: 650.0000 mg | ORAL_TABLET | Freq: Four times a day (QID) | ORAL | Status: DC | PRN

## 2021-06-30 MED ORDER — HYDROCODONE-ACETAMINOPHEN 5-325 MG PO TABS
1.0000 | ORAL_TABLET | Freq: Four times a day (QID) | ORAL | Status: DC | PRN
  Administered 2021-07-01 – 2021-07-06 (×10): 1 via ORAL
  Filled 2021-06-30 (×10): qty 1

## 2021-06-30 MED ORDER — SODIUM CHLORIDE 0.9 % IV BOLUS
1000.0000 mL | Freq: Once | INTRAVENOUS | Status: AC
  Administered 2021-06-30: 1000 mL via INTRAVENOUS

## 2021-06-30 MED ORDER — POTASSIUM CHLORIDE CRYS ER 20 MEQ PO TBCR
40.0000 meq | EXTENDED_RELEASE_TABLET | Freq: Once | ORAL | Status: AC
  Administered 2021-06-30: 40 meq via ORAL
  Filled 2021-06-30: qty 2

## 2021-06-30 MED ORDER — EMTRICITAB-RILPIVIR-TENOFOV AF 200-25-25 MG PO TABS
1.0000 | ORAL_TABLET | Freq: Every day | ORAL | Status: DC
  Administered 2021-06-30 – 2021-07-03 (×3): 1 via ORAL
  Filled 2021-06-30 (×5): qty 1

## 2021-06-30 MED ORDER — ACETAMINOPHEN 650 MG RE SUPP: 650.0000 mg | Freq: Four times a day (QID) | RECTAL | Status: DC | PRN

## 2021-06-30 MED ORDER — ENOXAPARIN SODIUM 40 MG/0.4ML IJ SOSY: 40.0000 mg | PREFILLED_SYRINGE | INTRAMUSCULAR | Status: DC

## 2021-06-30 MED ORDER — POLYETHYLENE GLYCOL 3350 17 G PO PACK: 17.0000 g | PACK | Freq: Every day | ORAL | Status: DC | PRN

## 2021-06-30 MED ORDER — ADULT MULTIVITAMIN W/MINERALS CH
1.0000 | ORAL_TABLET | Freq: Every day | ORAL | Status: DC
  Administered 2021-07-01 – 2021-07-06 (×6): 1 via ORAL
  Filled 2021-06-30 (×6): qty 1

## 2021-06-30 NOTE — Assessment & Plan Note (Signed)
Patient presents with 1 out of 5 strength in the proximal right lower extremity muscles and 2 out of 5 strength right knee flexion.  This is a change from clinic visit 04/2021.  Patient does not have any fevers, chills, numbness in the groin region, fecal or urinary incontinence. -We will obtain MRI of the lumbar spine given patient's worsening exam

## 2021-06-30 NOTE — Assessment & Plan Note (Signed)
Patient noted to have a BUN of 33 and a serum creatinine of 1.5.  He is also noted to have an albumin of 2.5 and a sodium of 124.  He states that he has had poor oral intake in the setting of nausea and decreased appetite which he attributes to initiation of duloxetine for his pain in November 2022. -Discontinued his duloxetine -Patient is s/p 1L of NS -Will give additional bolus of 1L

## 2021-06-30 NOTE — Progress Notes (Incomplete)
Patrick [name] states he was going to his PCP's office visit for a 2 month follow up regarding his lower back pain. On arrival, he was told his blood pressure was very low and his blood counts were low.   He notes that his appetite has decreased but he eats 2-3 meals per day - generally TV dinners and soup. He was surprised with how much weight he has lost over the last 2 months though.   He takes Naproxen daily - 2 in the morning, 1 in the afternoon and 1 before bedtime. He has been doing this regimen for 4 weeks.   He notes that about 4 weeks ago, he lost his balance when installing a new toilet seat cover and fell back onto his tail bone. He experienced pain there since then but it is improving. He did not hit his head or lose consciousness.   No fever, chills, night sweats. He has noticed when he closes his eyes, he sees rectangles with "notations" but they are not legible. No blurry vision or double vision though. Patrick [name] denies any new lumps/bumps, chest pain, SOB. He has noticed DOE but felt it was secondary to fatigue and deconditioning. He denies any nausea, vomiting or abdominal pain. He denies any red or black stools.   Only uses alcohol socially - last drink was June 2022.  Denies any tobacco or drug use.   Father passed of lung cancer - he was a heavy smoker. Father had a CABG secondary to MI at the age of 67.  No family history of colon or prostate cancer.

## 2021-06-30 NOTE — Progress Notes (Signed)
° °  CC: follow up regarding back pain   HPI:  [name] is a 71 y.o. M with a PMH of HIV (well controlled as of 08/2020), HLD, HTN who presents for follow up of his lower back pain. He was last seen in clinic 04/2021 for this. Regarding his back pain, patient states that he has actually had numbness and tingling that started approximately 3 months ago in his ankles and travels up to his hips. He states that the abnormal sensation is usually precipitated by him ambulating.  As a result of this numbness and tingling patient has had difficulty with ambulating for longer distances stating he can only take a few short steps. He also feels that he is generally not getting around as well as he was during last clinic visit. He denies any urinary or fecal incontinence, numbness  in the groin region, fever, chills, chest pain or shortness of breath.  Of note patient does also state that since his last clinic visit while working in his house he fell onto his tailbone.  He does state that he has some residual tenderness from this episode but that it is not severe.   While in clinic patient also states that since starting duloxetine in November, 2022, because it caused him to have dry mouth, decreased appetite, and nausea he stopped eating like he normally does. He notes he mostly eats a couple of TV dinners, because he does not cook much for himself due to his increased difficulty getting around. He also notes that it is dificult for him to shower or dress himself (stating it took him around 2 hours to get dressed for his appointment).   Past Medical History:  Diagnosis Date   Allergy    seasonal   Arthritis    r knee   H/O inguinal hernia repair    History of tobacco use    HIV infection ([place])    Hyperlipidemia    Hypertension    Strabismic amblyopia    Review of Systems:  Negative except per above.   Physical Exam:  Vitals:   06/30/21 1326 06/30/21 1330 06/30/21 1500  BP: (!) 73/48 92/75  (!) 84/42  Pulse: 83 97 68  Temp: 98.1 F (36.7 C)    TempSrc: Oral    SpO2: 100%    Weight:  145 lb 4.8 oz (65.9 kg)   Height: 5\' 10"  (1.778 m)     Physical Exam  Constitutional: well-developed, malnourished, and in no distress.  HENT:  Head: Normocephalic and atraumatic.  Eyes: EOM are normal, conjunctival pallor  Neck: Normal range of motion.  Cardiovascular: Normal rate, regular rhythm, normal heart sounds and intact distal pulses. no gallop and no friction rub.  No murmur heard. Pulmonary/Chest: Non labored breathing on RA, no wheezing or crackles Abdominal: Soft. Bowel sounds are normal. Non tender, non distended abdomen Musculoskeletal: Normal range of motion. Negative straight leg test. Mild TTP at bilateral SI joints, and midline at ~L5. No overlying skin changes of this area.        General: No tenderness or edema.  Neurological: He is alert and oriented to person, place, and time. 5/5 BUE, LLE 5/5 proxmially and distally. RLE proximal muscles 1/5, knee extension 4/5, knee flexion 3/5, sensation intact bilaterally  Skin: Skin is warm and dry.    Assessment & Plan:   See Encounters Tab for problem based charting.  Patient seen with Dr.  Saverio Danker

## 2021-06-30 NOTE — Assessment & Plan Note (Addendum)
On arrival to clinic, patient was noted to be hypotensive with blood pressures in the 40H systolic over 68G diastolic.  He was also noted to be orthostatic.  He denied headache, dizziness or lightheadedness, changes in vision, or chest pain.  Patient was told to drink 2 pitchers of water and remained hypotensive.  His blood pressure did improve after a 1 L bolus.  Assessment and plan: Suspect patient's hypotension is in the setting of hypovolemia due to poor oral intake with continued use of his combination olmesartan/amlodipine/hydrochlorothiazide antihypertensive.  Patient has no other symptoms including denying fever, shortness of breath, urinary symptoms, abdominal pain, or visible open wounds to suggest infection as etiology of his hypotension.  In addition on labs he was noted to have a white blood cell count within normal limits.    Patient was noted to have a normocytic anemia with a hemoglobin of 8.9 and MCV of 90, previously hemoglobin in 08/2020 was 14.4.  However patient with no active signs of bleeding. -We will give patient a second 1 L bolus of normal saline -Patient sent to the ED for further work-up of his hypotension -Discontinued his antihypertensives

## 2021-06-30 NOTE — H&P (Addendum)
Date: 07/01/2021               Patient Name:  Patrick [name] MRN: [id number]  DOB: 29-Jul-1950 Age / Sex: 71 y.o., male   PCP: Lajean Manes, MD         Medical Service: Internal Medicine Teaching Service         Attending Physician: Dr. Lalla Brothers     First Contact: Buddy Duty, DO Pager: RA [phone]  Second Contact: Hadassah Pais, MD Pager: PB [phone]       After Hours (After 5p/  First Contact Pager: [phone]  weekends / holidays): Second Contact Pager: [phone]    Chief Complaint: Hypotension and anemia   History of Present Illness:  Patrick [name] is a 71 y.o. male with a pertinent PMH undetectable HIV on ART since 2002, HTN on olmesartan-amlodipine-HCTZ 40-5-25, and chronic lumbar DDD who was sent to the ED [facility] after a routine follow up on 1/5, due to anemia and hypotension.  He was seen in the clinic 2 months prior to this for lumbar back pain with no red flags, and was started on duloxetine 60 mg. He reports noticing nausea and a loss of appetite since starting duloxetine, so he stopped taking it. BP during that visit was 130/80 with no other acute concerns or findings. In the clinic today, he was hypotensive 70/40's and orthostatic, and found to have a Hgb of 8.9, down from his baseline 14. He denied dizziness, CP, or SHOB at the time. He was given 2 bottles of water and 1L IVF, and his BP improved to 85/50's. He last took his BP medication last in the morning. He was instructed to discontinue BP medication and to go to the ED.   Patient arrived to the ED with BP 90/70's and complaints of DOE, but otherwise asymptomatic. BP improved to 110/70's with 1L IVF. Initial work up with Na 125, K 2.7, Cr 1.5 (baseline 1), Hbg 8.9 (baseline 14), FOBT negative, Ferritin 3500, and Alk phos 700. Given symptomatic anemia, transient hypotension, electrolyte abnormalities, IMTS called for admission.  At the time of our evaluation, he was hemodynamically stable and  asymptomatic with the exception ongoing fatigue x several months, which he attributes to poor PO intake and suppressed appetite for the last 2 months. Per chart review, he's had a 40 lbs unintentional weight loss in the last 2 months. He denies fever, chills, and night sweats. He denies vision changes, dizziness, headaches, CP, SHOB, abdominal pain, and urinary symptoms including incontinence, dysuria, polyuria or oliguria. He does report new onset constipation for several weeks, and last BM was ~1 week ago. He denies change in stool form, denying thin stools, and denies dark or bright red stool. He reports "my back pain has been giving me trouble" causing him to use naproxen 3x/day (4 pills/day) for 1 month, along with Tylenol. Reports falling on his tailbone 4 weeks ago while installing a toilet seat, but denies any head trauma; he states that pain associated with the fall has improved.    Meds:  No current facility-administered medications on file prior to encounter.   Current Outpatient Medications on File Prior to Encounter  Medication Sig Dispense Refill   atorvastatin (LIPITOR) 40 MG tablet TAKE 1 TABLET(40 MG) BY MOUTH DAILY (Patient taking differently: Take 40 mg by mouth daily.) 90 tablet 2   calcium citrate-vitamin D (CITRACAL+D) 315-200 MG-UNIT tablet Take 1 tablet by mouth daily.  Multiple Vitamins-Minerals (MULTIVITAMIN WITH MINERALS) tablet Take 1 tablet by mouth daily.     naproxen sodium (ALEVE) 220 MG tablet Take 220-440 mg by mouth See admin instructions. 440 mg in the morning 220 mg in the afternoon and at bedtime     ODEFSEY 200-25-25 MG TABS tablet TAKE 1 TABLET BY MOUTH EVERY DAY (Patient taking differently: 1 tablet every evening.) 90 tablet 2    Allergies: Allergies as of 06/30/2021   (No Known Allergies)   Past Medical History:  Diagnosis Date   Allergy    seasonal   Arthritis    r knee   H/O inguinal hernia repair    History of tobacco use    HIV infection  ([place])    Hyperlipidemia    Hypertension    Strabismic amblyopia     Family History:  Father passed of lung cancer - he was a heavy smoker. Father had a CABG secondary to MI at the age of 30.  No family history of colon or prostate cancer.   Social History:   Lives aline in [place] Only uses alcohol socially - last drink was June 2022.  Denies any tobacco or drug use.  Illicit drug use- denies use  IADLs/ADLs- can person independently at baseline   Review of Systems: A complete ROS was negative except as per HPI.    Physical Exam: Blood pressure 105/65, pulse 87, temperature 97.8 F (36.6 C), temperature source Oral, resp. rate 19, SpO2 97 %.  Constitutional: alert, cachectic appearing, in NAD HENT: normocephalic, mucous membranes moist, no palpable lymphadenopathy  Neck: supple, no palpable lymphadenopathy  Cardiovascular: RRR, no m/r/g, non-edematous bilateral LE Pulmonary/Chest: normal work of breathing on room air, LCTAB Abdominal: soft, non-tender to palpation, non-distended MSK: reduced bulk and tone Neurological: A&O x 3. No focal tenderness along spine. 5/5 strength in bilateral upper and lower extremities with intact sensation.   EKG: Bifascicular block    Assessment & Plan by Problem:  Patrick [name] is a 71 y.o. male with a pertinent PMH of HTN and undetectable HIV admitted for hypotension.   Hypotension   Hx of HTN  Hypotension likely multifactorial in the setting of poor PO intake and dehydration, in addition to rapid severe weight loss with continuation of blood pressure medications. Low suspicion hypotension is related to anemia given lack of symptoms consistent with blood loss. Blood pressure initially 70/40s, however responsive to IVF.  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 SPEP ordered in clinic, given MM suspicion due to age, anemia, AKI, and elevated alk phos; no hypercalcemia or elevated protein gap. Lumbar XR with age indeterminate T11, T12, and L4 compressions. CT abdomen/pelvic/chest with widespread osseous lytic and sclerotic metastatic disease of unknown primary with severe central canal stenosis at L4, prominent soft tissue metastatic component involving L5 spinous process, and moderate enlargement of prostate. Informed patient about results and the need for further workup, he is agreeable.  - IR consulted for soft tissue biopsy  - Follow up SPEP - PSA  Hypokalemia   K 3.4 in clinic -> 2.7 in ED. Received 80 mEq potassium.  - CMP   Hypovolemic hyponatremia   Na 125 on arrival. Suspect etiology is secondary to poor PO intake versus thiazide use. Has received 3L IVF so far.  -  Continue IVF resuscitation with additional 1L bolus LR  - CMP  Normocytic anemia   New normocytic anemia compared to 08/2020 labs. Likely 2/2 to bone infiltration given CT findings and absolute reticulocyte count 0.9 consistent with hypo-proliferation. Less likely to be acute blood anemia in setting of NSAID use given no evidence of blood loss at this time and negative FOBT. 2018 colonoscopy with sessile polys but otherwise normal.  - Iron and TIBC  - CBC   Spinal stenosis  Pathological compression fractures (T11-12, L2-4) Hx of chronic lumbar back pain  New deficits noted in clinic with 1/5 strength in the proximal right lower extremity and 2/5 strength with right knee flexion which are new compared to 04/2021 OV. No fecal or urinary incontinence. Surprisingly, exam in the ED benign with intact sensation. CT with multifactorial severe central canal stenosis at L4, and mixed lytic and sclerotic metastases noted throughout the lumbar spine with pathologic fracture of the L2, L3, and L4 vertebral bodies.  -  Hydrocodone-acetaminophen 5-325 q6h prn   AKI, resolved Found to have an AKI with Cr 1.5 (baseline 1) in clinic in s/o poor PO intake. Resolved with IVF, with improvement in Cr from 1.5 -> 1.1  - CMP  - Olmesartan-amlodipine-HCTZ DC'd  Bifascicular block  Chronicity unknown, no prior hx of cardiac disease.  - Echo  HIV  Well controlled, undetectable  Has been on ART since 2002. -Continue Odefsey    Best Practice: Diet: Normal IVF: LR  VTE: SCD  Code: Full   Lajean Manes, MD  Internal Medicine Resident, PGY-1 [facility]  Pager: [phone] 5:33 AM, 07/01/2021

## 2021-06-30 NOTE — ED Provider Triage Note (Signed)
Emergency Medicine Provider Triage Evaluation Note  Patrick [name] , a 71 y.o. male  was evaluated in triage.  Pt complains of low hemoglobin.  Patient presents here from internal medicine clinic with hypotension and anemia to 8.9.  Initial blood pressures 32G systolic.  Patient denies chest pain, shortness of breath, lightheadedness or dizziness, syncope.  He denies hematuria, hematemesis, hematochezia.  After further questioning he does endorse that he has had darker stools over the past few weeks.  He denies frank blood.  He is not anticoagulated.  Review of Systems  Positive: See above Negative:   Physical Exam  BP 94/66    Pulse 80    Temp 97.8 F (36.6 C) (Oral)    Resp 20    SpO2 99%  Gen:   Awake, no distress, pale Resp:  Normal effort  MSK:   Moves extremities without difficulty  Other:  S1/S2, irregular with murmur.  Pulses 1+ bilateral radial.  Medical Decision Making  Medically screening exam initiated at 5:56 PM.  Appropriate orders placed.  Patrick [name] was informed that the remainder of the evaluation will be completed by another provider, this initial triage assessment does not replace that evaluation, and the importance of remaining in the ED until their evaluation is complete.  Fluids running in triage room.  Patient needs room.   Patrick Hillier, PA-C 06/30/21 1758

## 2021-06-30 NOTE — Patient Instructions (Addendum)
Regarding your blood pressure. It is low today and this is likely because you have not been eating and drinking like normal. I have stopped your blood pressure medication. I will have you follow up with me in 1 week to see if this has improved.    Regarding your weight loss. I have put in for you to get several labs. I will give you a call regarding these. In the meant time I have stopped the duloxetine since you feel this has made you nauseous and decreased your appetite.   For your back pain, although you describe only having tenderness in your tailbone, the proximal weakness you have on the L side is concerning since this appears to be different than your last visit two months ago. We will also obtain an MRI of your lower back.

## 2021-06-30 NOTE — Progress Notes (Signed)
Patient escorted via w/c to ED with NS infusing left arm. Report given to [facility] Triage RN, and Legrand Como, Triage RN. Patient was left with Legrand Como in Triage room 58.

## 2021-06-30 NOTE — ED Triage Notes (Signed)
Pt comes from internal medicine clinic with reports of hypotension and hemoglobin of 8.9. Pt denies GI bleeding and hematemesis.

## 2021-06-30 NOTE — Assessment & Plan Note (Addendum)
Patient noted to have normocytic anemia which is new compared to labs from 08/2020.  Patient denies blood in stool and his last colonoscopy was in 2018 which showed sessile polyps but was otherwise normal.  His anemia is likely multifactorial in etiology.  Patient has had poor appetite for the last 2 months and his poor nutritional status is likely contributing to his anemia, patient may also be bleeding from his GI tract, albeit slowly.  Finally, given patient's renal dysfunction, elevated isolated alkaline phosphatase his symptoms could be related to multiple myeloma.  Also considered possible colorectal cancer. -Patient sent to the ED for further work-up -Follow-up vitamin B12, folate -Follow-up SPEP -We will likely need repeat colonoscopy

## 2021-06-30 NOTE — Assessment & Plan Note (Addendum)
Patient reports that he has had a poor appetite since 04/2021.  He feels his decreased appetite as result of initiation of duloxetine.  On exam, he is noted to have a approximate 40 pound weight loss since his November clinic visit, which is unintentional.  In addition, on labs he is noted to have an albumin of 2.5, normal TSH.  Unclear exact etiology of patient's unintentional weight loss.  Perhaps duloxetine is playing a small part but do not feel this is primary etiology.  Patient is being worked up for possible multiple myeloma given his age and anemia, albeit normocytic anemia, patient will need colonoscopy. -His duloxetine was discontinued -Recommend nutrition consult once admitted -Repeated patient's HIV viral load and CD4 count as this could also be contributing to his weight loss (though noted to have undetected viral load and normal CD4 count 08/2020)

## 2021-06-30 NOTE — ED Provider Notes (Signed)
[facility] EMERGENCY DEPARTMENT Provider Note   CSN: [id number] Arrival date & time: 06/30/21  1739     History  Chief Complaint  Patient presents with   Hypotension    BRIGHTON PILLEY III is a 71 y.o. male.  Presented to the emergency room with concern for low blood pressure and low hemoglobin.  Patient reports that over the past month or so he has noted feeling short of breath with exertion, feels easily winded.  Otherwise has had general fatigue but denies any other symptoms.  No chest pain or abdominal pain.  Has not noted any blood in stools.  No dark or tarry stools.  Has had some ongoing back pain over the last few months and has increased Tylenol and naproxen use.  Denies prior GI bleed.  Additional history obtained from chart review, review of recent PCP note from earlier today.  Patient was hypotensive and anemic, sent to ER for further eval.  HPI     Home Medications Prior to Admission medications   Medication Sig Start Date End Date Taking? Authorizing Provider  atorvastatin (LIPITOR) 40 MG tablet TAKE 1 TABLET(40 MG) BY MOUTH DAILY Patient taking differently: Take 40 mg by mouth daily. 01/28/21  Yes Gaylan Gerold, DO  calcium citrate-vitamin D (CITRACAL+D) 315-200 MG-UNIT tablet Take 1 tablet by mouth daily.   Yes [provider]  Multiple Vitamins-Minerals (MULTIVITAMIN WITH MINERALS) tablet Take 1 tablet by mouth daily.   Yes [provider]  naproxen sodium (ALEVE) 220 MG tablet Take 220-440 mg by mouth See admin instructions. 440 mg in the morning 220 mg in the afternoon and at bedtime   Yes [provider]  ODEFSEY 200-25-25 MG TABS tablet TAKE 1 TABLET BY MOUTH EVERY DAY Patient taking differently: 1 tablet every evening. 10/22/20  Yes Campbell Riches, MD      Allergies    Patient has no known allergies.    Review of Systems   Review of Systems  Constitutional:  Positive for fatigue. Negative for chills and fever.   HENT:  Negative for ear pain and sore throat.   Eyes:  Negative for pain and visual disturbance.  Respiratory:  Positive for shortness of breath. Negative for cough.   Cardiovascular:  Negative for chest pain and palpitations.  Gastrointestinal:  Negative for abdominal pain and vomiting.  Genitourinary:  Negative for dysuria and hematuria.  Musculoskeletal:  Positive for back pain. Negative for arthralgias.  Skin:  Negative for color change and rash.  Neurological:  Negative for seizures and syncope.  All other systems reviewed and are negative.  Physical Exam Updated Vital Signs BP 113/75    Pulse 98    Temp 97.8 F (36.6 C) (Oral)    Resp (!) 23    SpO2 94%  Physical Exam Vitals and nursing note reviewed.  Constitutional:      General: He is not in acute distress.    Appearance: He is well-developed.     Comments: Pale  HENT:     Head: Normocephalic and atraumatic.  Eyes:     Comments: Pale conjunctiva  Cardiovascular:     Rate and Rhythm: Normal rate and regular rhythm.     Heart sounds: No murmur heard. Pulmonary:     Effort: Pulmonary effort is normal. No respiratory distress.     Breath sounds: Normal breath sounds.  Abdominal:     Palpations: Abdomen is soft.     Tenderness: There is no abdominal tenderness.  Musculoskeletal:        General: No swelling.     Cervical back: Neck supple.  Skin:    General: Skin is warm and dry.     Capillary Refill: Capillary refill takes less than 2 seconds.     Coloration: Skin is pale.  Neurological:     General: No focal deficit present.     Mental Status: He is alert.  Psychiatric:        Mood and Affect: Mood normal.     ED Results / Procedures / Treatments   Labs (all labs ordered are listed, but only abnormal results are displayed) Labs Reviewed  BASIC METABOLIC PANEL - Abnormal; Notable for the following components:      Result Value   Sodium 125 (*)    Potassium 2.7 (*)    Chloride 92 (*)    Glucose, Bld 114  (*)    BUN 31 (*)    Calcium 7.8 (*)    All other components within normal limits  CBC WITH DIFFERENTIAL/PLATELET - Abnormal; Notable for the following components:   RBC 2.63 (*)    Hemoglobin 7.9 (*)    HCT 23.6 (*)    Platelets 148 (*)    nRBC 4.4 (*)    Abs Immature Granulocytes 0.40 (*)    All other components within normal limits  RETICULOCYTES - Abnormal; Notable for the following components:   RBC. 2.81 (*)    Immature Retic Fract 32.6 (*)    All other components within normal limits  FERRITIN - Abnormal; Notable for the following components:   Ferritin 3,580 (*)    All other components within normal limits  RESP PANEL BY RT-PCR (FLU A&B, COVID) ARPGX2  FOLATE  MAGNESIUM  PATHOLOGIST SMEAR REVIEW  POC OCCULT BLOOD, ED  TYPE AND SCREEN  ABO/RH    EKG None  Radiology No results found.  Procedures Procedures    Medications Ordered in ED Medications  emtricitabine-rilpivir-tenofovir AF (ODEFSEY) 200-25-25 MG per tablet 1 tablet (1 tablet Oral Given 06/30/21 2232)  sodium chloride 0.9 % bolus 1,000 mL (0 mLs Intravenous Stopped 06/30/21 2254)  potassium chloride SA (KLOR-CON M) CR tablet 40 mEq (40 mEq Oral Given 06/30/21 2042)    ED Course/ Medical Decision Making/ A&P                           Medical Decision Making  71 year old gentleman notable past medical history HIV, hypertension presenting to ER with concern for low hemoglobin, low blood pressure.  Seen at PCP office earlier today, have gone to primary doctor for getting easily winded, exertional dyspnea.  In clinic found to have new anemia, baseline hemoglobin is normal but now 8.9.  Repeated basic labs in ER, hemoglobin was 7.9 here.  Broad differential for cause of anemia including GI bleed, oncologic process, nutritional deficiency.  Hemoccult negative.  Per review of recent CMP also had elevated alk phos.  There is also report of weight loss.  Suspicion that patient may have underlying oncologic process.   Patient's blood pressure improved with small fluid bolus.  Discussed case with internal medicine residents, they will admit for further management and work-up.  Given patient's hyponatremia, hypokalemia, symptomatic anemia, transient hypotension, believe he would benefit from inpatient admission for further management and work-up of his electrolyte derangements, anemia.        Final Clinical Impression(s) / ED Diagnoses Final diagnoses:  Anemia, unspecified type  Rx / DC Orders ED Discharge Orders     None         Lucrezia Starch, MD 06/30/21 2323

## 2021-07-01 ENCOUNTER — Inpatient Hospital Stay (HOSPITAL_COMMUNITY): Payer: PPO

## 2021-07-01 ENCOUNTER — Observation Stay (HOSPITAL_COMMUNITY): Payer: PPO

## 2021-07-01 ENCOUNTER — Encounter (HOSPITAL_COMMUNITY): Payer: Self-pay | Admitting: Student in an Organized Health Care Education/Training Program

## 2021-07-01 ENCOUNTER — Ambulatory Visit (HOSPITAL_COMMUNITY)
Admission: RE | Admit: 2021-07-01 | Discharge: 2021-07-01 | Disposition: A | Discharge status: Home / Self Care | Payer: PPO | Source: Ambulatory Visit | Attending: Internal Medicine | Admitting: Internal Medicine

## 2021-07-01 DIAGNOSIS — M8450XA Pathological fracture in neoplastic disease, unspecified site, initial encounter for fracture: Secondary | ICD-10-CM | POA: Diagnosis present

## 2021-07-01 DIAGNOSIS — M5136 Other intervertebral disc degeneration, lumbar region: Secondary | ICD-10-CM | POA: Diagnosis present

## 2021-07-01 DIAGNOSIS — I452 Bifascicular block: Secondary | ICD-10-CM | POA: Diagnosis present

## 2021-07-01 DIAGNOSIS — R29898 Other symptoms and signs involving the musculoskeletal system: Secondary | ICD-10-CM | POA: Diagnosis present

## 2021-07-01 DIAGNOSIS — E440 Moderate protein-calorie malnutrition: Secondary | ICD-10-CM | POA: Diagnosis present

## 2021-07-01 DIAGNOSIS — R9431 Abnormal electrocardiogram [ECG] [EKG]: Secondary | ICD-10-CM | POA: Diagnosis not present

## 2021-07-01 DIAGNOSIS — E876 Hypokalemia: Secondary | ICD-10-CM | POA: Diagnosis present

## 2021-07-01 DIAGNOSIS — B20 Human immunodeficiency virus [HIV] disease: Secondary | ICD-10-CM | POA: Diagnosis present

## 2021-07-01 DIAGNOSIS — M47816 Spondylosis without myelopathy or radiculopathy, lumbar region: Secondary | ICD-10-CM | POA: Diagnosis not present

## 2021-07-01 DIAGNOSIS — I959 Hypotension, unspecified: Secondary | ICD-10-CM

## 2021-07-01 DIAGNOSIS — C61 Malignant neoplasm of prostate: Secondary | ICD-10-CM | POA: Diagnosis present

## 2021-07-01 DIAGNOSIS — D649 Anemia, unspecified: Secondary | ICD-10-CM | POA: Diagnosis not present

## 2021-07-01 DIAGNOSIS — E860 Dehydration: Secondary | ICD-10-CM | POA: Diagnosis present

## 2021-07-01 DIAGNOSIS — R292 Abnormal reflex: Secondary | ICD-10-CM | POA: Diagnosis present

## 2021-07-01 DIAGNOSIS — N179 Acute kidney failure, unspecified: Secondary | ICD-10-CM | POA: Diagnosis present

## 2021-07-01 DIAGNOSIS — M48061 Spinal stenosis, lumbar region without neurogenic claudication: Secondary | ICD-10-CM | POA: Diagnosis present

## 2021-07-01 DIAGNOSIS — Z801 Family history of malignant neoplasm of trachea, bronchus and lung: Secondary | ICD-10-CM | POA: Diagnosis not present

## 2021-07-01 DIAGNOSIS — D630 Anemia in neoplastic disease: Secondary | ICD-10-CM | POA: Diagnosis present

## 2021-07-01 DIAGNOSIS — E861 Hypovolemia: Secondary | ICD-10-CM | POA: Diagnosis present

## 2021-07-01 DIAGNOSIS — E871 Hypo-osmolality and hyponatremia: Secondary | ICD-10-CM | POA: Diagnosis present

## 2021-07-01 DIAGNOSIS — G629 Polyneuropathy, unspecified: Secondary | ICD-10-CM | POA: Diagnosis present

## 2021-07-01 DIAGNOSIS — M898X8 Other specified disorders of bone, other site: Secondary | ICD-10-CM | POA: Diagnosis not present

## 2021-07-01 DIAGNOSIS — M545 Low back pain, unspecified: Secondary | ICD-10-CM | POA: Diagnosis not present

## 2021-07-01 DIAGNOSIS — I9589 Other hypotension: Secondary | ICD-10-CM | POA: Diagnosis present

## 2021-07-01 DIAGNOSIS — C801 Malignant (primary) neoplasm, unspecified: Secondary | ICD-10-CM | POA: Diagnosis not present

## 2021-07-01 DIAGNOSIS — R748 Abnormal levels of other serum enzymes: Secondary | ICD-10-CM | POA: Diagnosis present

## 2021-07-01 DIAGNOSIS — K5900 Constipation, unspecified: Secondary | ICD-10-CM | POA: Diagnosis present

## 2021-07-01 DIAGNOSIS — M8458XA Pathological fracture in neoplastic disease, other specified site, initial encounter for fracture: Secondary | ICD-10-CM | POA: Diagnosis present

## 2021-07-01 DIAGNOSIS — D6959 Other secondary thrombocytopenia: Secondary | ICD-10-CM | POA: Diagnosis present

## 2021-07-01 DIAGNOSIS — E785 Hyperlipidemia, unspecified: Secondary | ICD-10-CM | POA: Diagnosis present

## 2021-07-01 DIAGNOSIS — C7989 Secondary malignant neoplasm of other specified sites: Secondary | ICD-10-CM | POA: Diagnosis not present

## 2021-07-01 DIAGNOSIS — Z20822 Contact with and (suspected) exposure to covid-19: Secondary | ICD-10-CM | POA: Diagnosis present

## 2021-07-01 DIAGNOSIS — G319 Degenerative disease of nervous system, unspecified: Secondary | ICD-10-CM | POA: Diagnosis not present

## 2021-07-01 DIAGNOSIS — N200 Calculus of kidney: Secondary | ICD-10-CM | POA: Diagnosis not present

## 2021-07-01 DIAGNOSIS — J9811 Atelectasis: Secondary | ICD-10-CM | POA: Diagnosis not present

## 2021-07-01 DIAGNOSIS — C7951 Secondary malignant neoplasm of bone: Secondary | ICD-10-CM | POA: Diagnosis present

## 2021-07-01 DIAGNOSIS — I10 Essential (primary) hypertension: Secondary | ICD-10-CM | POA: Diagnosis present

## 2021-07-01 LAB — CBC WITH DIFFERENTIAL/PLATELET
Abs Immature Granulocytes: 0.2 10*3/uL — ABNORMAL HIGH (ref 0.00–0.07)
Band Neutrophils: 10 %
Basophils Absolute: 0.1 10*3/uL (ref 0.0–0.1)
Basophils Relative: 2 %
Eosinophils Absolute: 0.2 10*3/uL (ref 0.0–0.5)
Eosinophils Relative: 3 %
HCT: 24.8 % — ABNORMAL LOW (ref 39.0–52.0)
Hemoglobin: 8.2 g/dL — ABNORMAL LOW (ref 13.0–17.0)
Lymphocytes Relative: 14 %
Lymphs Abs: 0.8 10*3/uL (ref 0.7–4.0)
MCH: 29.9 pg (ref 26.0–34.0)
MCHC: 33.1 g/dL (ref 30.0–36.0)
MCV: 90.5 fL (ref 80.0–100.0)
Metamyelocytes Relative: 1 %
Monocytes Absolute: 0.2 10*3/uL (ref 0.1–1.0)
Monocytes Relative: 3 %
Myelocytes: 3 %
Neutro Abs: 4.3 10*3/uL (ref 1.7–7.7)
Neutrophils Relative %: 64 %
Platelets: 126 10*3/uL — ABNORMAL LOW (ref 150–400)
RBC: 2.74 MIL/uL — ABNORMAL LOW (ref 4.22–5.81)
RDW: 14.4 % (ref 11.5–15.5)
Smear Review: NORMAL
WBC: 5.8 10*3/uL (ref 4.0–10.5)
nRBC: 2.6 % — ABNORMAL HIGH (ref 0.0–0.2)

## 2021-07-01 LAB — HIV-1 RNA QUANT-NO REFLEX-BLD
HIV 1 RNA Quant: <20 copies/mL
LOG10 HIV-1 RNA: UNDETERMINED log10copy/mL

## 2021-07-01 LAB — COMPREHENSIVE METABOLIC PANEL
ALT: 20 U/L (ref 0–44)
AST: 45 U/L — ABNORMAL HIGH (ref 15–41)
Albumin: 2.4 g/dL — ABNORMAL LOW (ref 3.5–5.0)
Alkaline Phosphatase: 593 U/L — ABNORMAL HIGH (ref 38–126)
Anion gap: 12 (ref 5–15)
BUN: 20 mg/dL (ref 8–23)
CO2: 21 mmol/L — ABNORMAL LOW (ref 22–32)
Calcium: 8.4 mg/dL — ABNORMAL LOW (ref 8.9–10.3)
Chloride: 97 mmol/L — ABNORMAL LOW (ref 98–111)
Creatinine, Ser: 0.84 mg/dL (ref 0.61–1.24)
GFR, Estimated: >60 mL/min (ref >60)
Glucose, Bld: 113 mg/dL — ABNORMAL HIGH (ref 70–99)
Potassium: 3.3 mmol/L — ABNORMAL LOW (ref 3.5–5.1)
Sodium: 130 mmol/L — ABNORMAL LOW (ref 135–145)
Total Bilirubin: 0.6 mg/dL (ref 0.3–1.2)
Total Protein: 5.1 g/dL — ABNORMAL LOW (ref 6.5–8.1)

## 2021-07-01 LAB — GAMMA GT: GGT: 42 U/L (ref 7–50)

## 2021-07-01 LAB — ECHOCARDIOGRAM COMPLETE
Height: 70 in
P 1/2 time: 278 msec
S' Lateral: 3.1 cm
Weight: 2324.8 oz

## 2021-07-01 LAB — IRON AND TIBC
Iron: 83 ug/dL (ref 45–182)
Saturation Ratios: 48 % — ABNORMAL HIGH (ref 17.9–39.5)
TIBC: 172 ug/dL — ABNORMAL LOW (ref 250–450)
UIBC: 89 ug/dL

## 2021-07-01 LAB — CREATININE, URINE, RANDOM: Creatinine, Urine: 47.2 mg/dL

## 2021-07-01 LAB — PROTIME-INR
INR: 1.2 (ref 0.8–1.2)
Prothrombin Time: 14.7 seconds (ref 11.4–15.2)

## 2021-07-01 LAB — OSMOLALITY, URINE: Osmolality, Ur: 225 mOsm/kg — ABNORMAL LOW (ref 300–900)

## 2021-07-01 LAB — OSMOLALITY: Osmolality: 282 mOsm/kg (ref 275–295)

## 2021-07-01 LAB — SODIUM, URINE, RANDOM: Sodium, Ur: 18 mmol/L

## 2021-07-01 LAB — PSA: Prostatic Specific Antigen: >3000 ng/mL — ABNORMAL HIGH (ref 0.00–4.00)

## 2021-07-01 IMAGING — MR MR LUMBAR SPINE W/O CM
4 of 5 series · 15 of 48 positions shown · non-contrast
Comparison: CT chest/abdomen/pelvis [DATE].

CLINICAL DATA: Provided history: Neuropathy. Weakness of right hip.
Lumbar radiculopathy, immunocompromised; acute weakness. Additional
history provided: Low back pain for 4 months, pain radiates down
both legs.

EXAM:
MRI LUMBAR SPINE WITHOUT CONTRAST
TECHNIQUE: Multiplanar, multisequence MR imaging of the lumbar spine was
performed. No intravenous contrast was administered.

[Series 3: T2 · sagittal · 4.0mm · 0.55mm/px · 5 of 16 slices shown (1 of 2)]
[im 1/16]
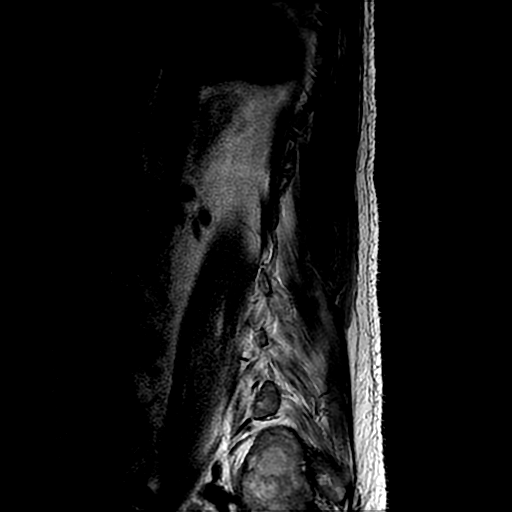
[im 4/16]
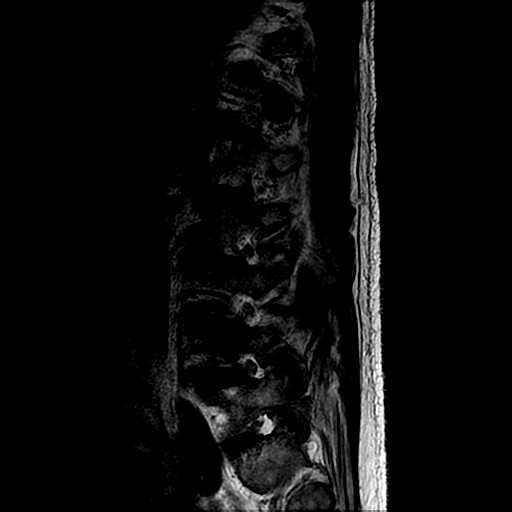
[im 8/16]
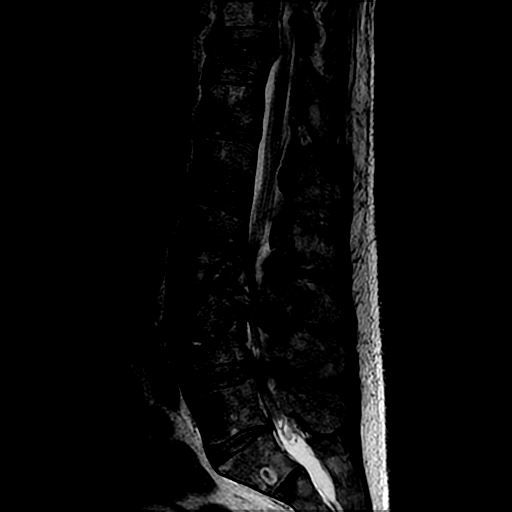
[im 12/16]
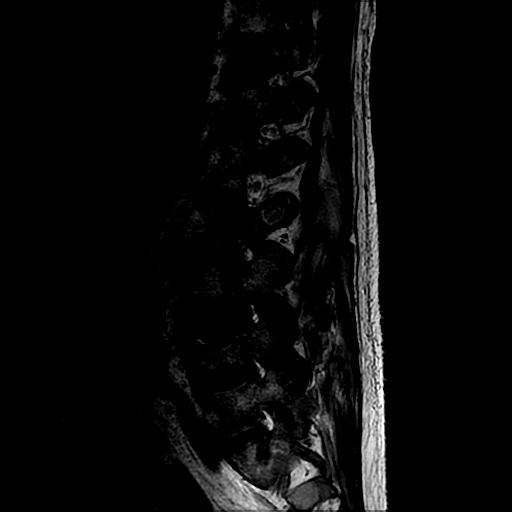
[im 16/16]
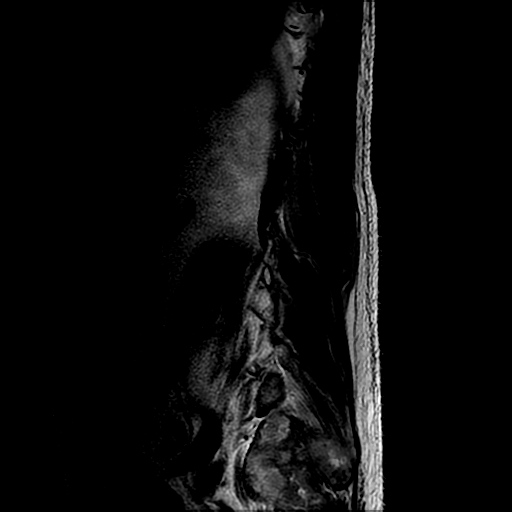

[Series 5: T1 · sagittal · 4.0mm · 0.55mm/px · 3 of 16 slices shown (1 of 2)]
[im 4/16]
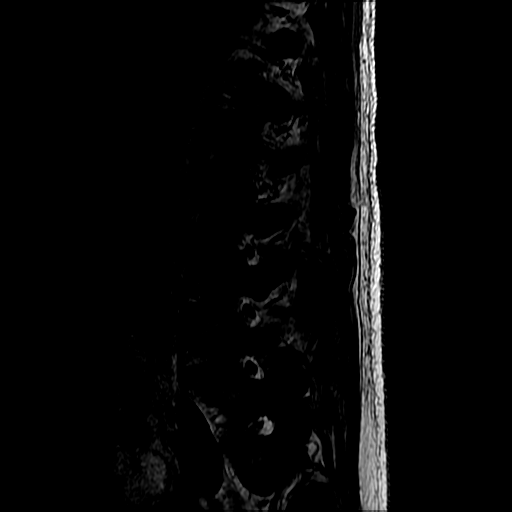
[im 10/16]
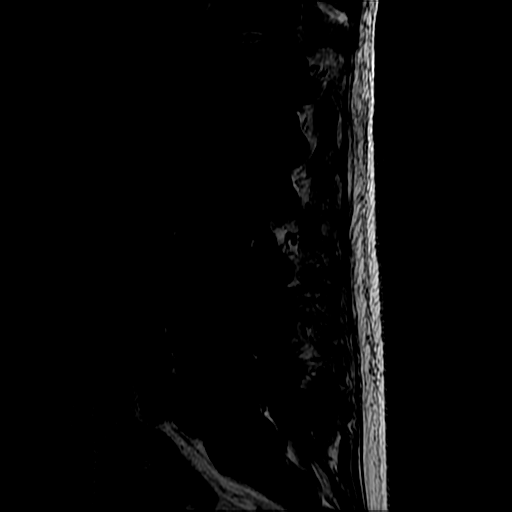
[im 16/16]
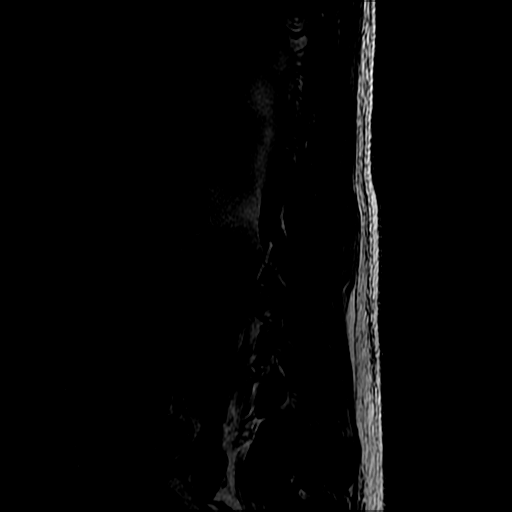

[Series 6: T2 · axial · 4.0mm · 0.39mm/px · z∈[-133,+42]mm · 4 of 44 slices shown (2 of 2)]
[im 3/44]
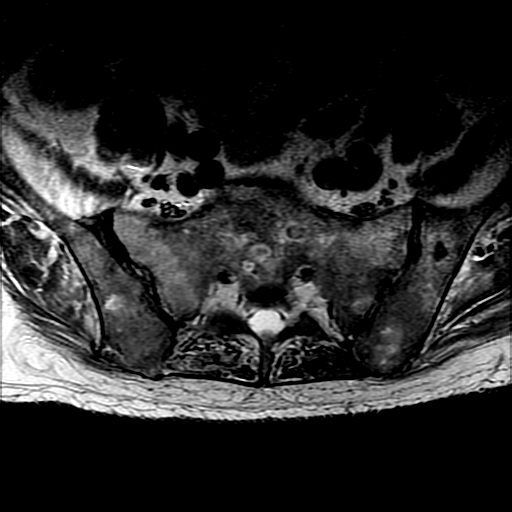
[im 6/44]
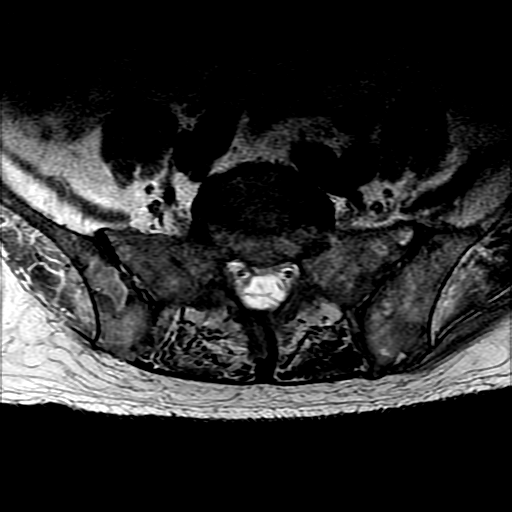
[im 23/44]
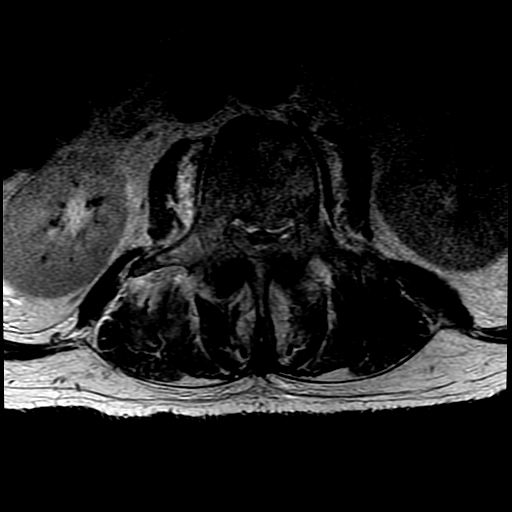
[im 38/44]
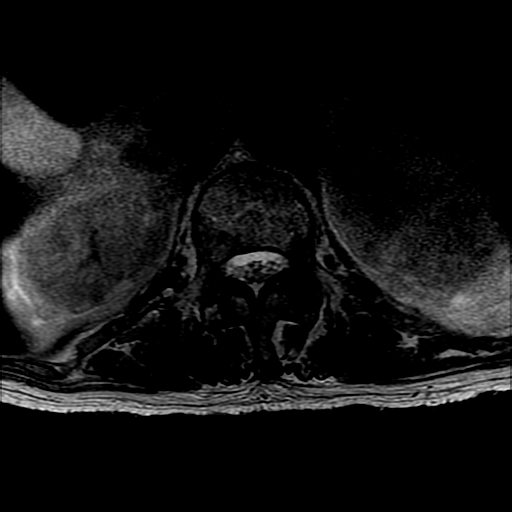

[Series 7: T1 · axial · 4.0mm · 0.39mm/px · z∈[-118,+42]mm · 3 of 44 slices shown (2 of 2)]
[im 6/44]
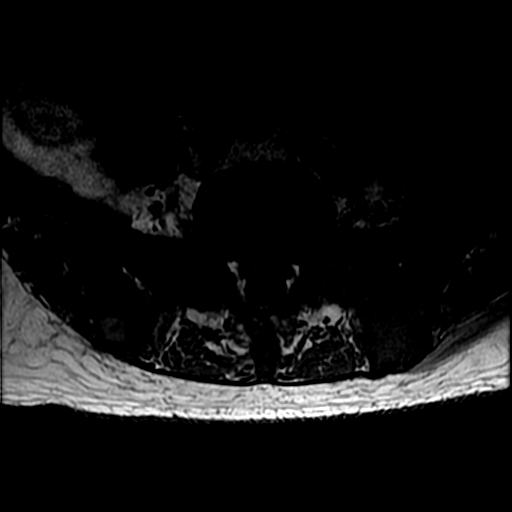
[im 23/44]
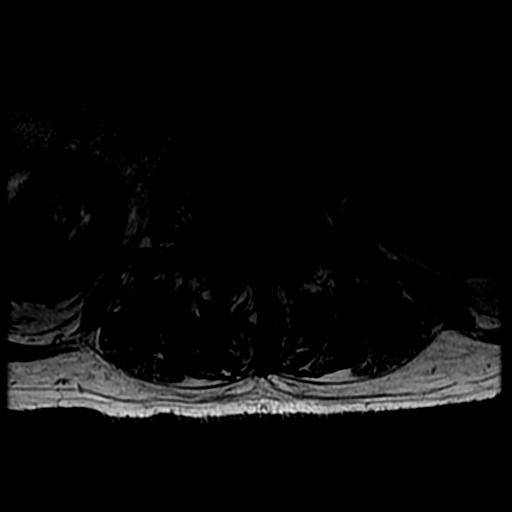
[im 38/44]
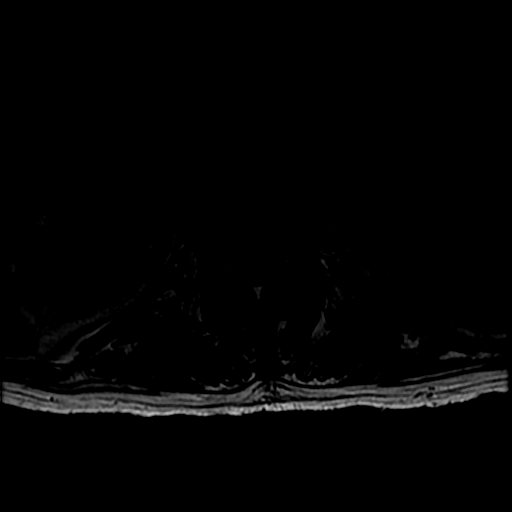

[15 of 48 positions shown; findings below may reference images not displayed]

FINDINGS: Segmentation: 5 lumbar vertebrae. The caudal most well-formed
intervertebral disc space is designated L5-S1.

Alignment: Bony retropulsion at the T11, L3 and L4 levels. Most
notably, bony retropulsion at the level of the L3 inferior endplate
measures 5 mm.

Vertebrae: There is extensive multifocal osseous signal abnormality
compatible with diffuse osseous metastatic disease. There is
background diffuse abnormal T1 hypointense marrow signal, suggesting
chronic anemia. There are multilevel presumed pathologic vertebral
compression fractures as follows. T11 compression fracture (40%
height loss). T12 compression fracture (less than 80% height loss).
Superimposed Schmorl node within the T12 inferior endplate. L2
compression fracture (40-50% height loss posteriorly). L3
compression fracture (50% height loss). L4 superior endplate
compression fracture (30-40% height loss). Also of note, there is
fairly extensive soft tissue tumor replacing the posterior elements
at L5, most notably within the spinous process and bilateral
articular pillars/laminae. This soft tissue tumor appears to partly
encroach upon the spinal canal at this level.

Conus medullaris and cauda equina: Conus extends to the L1 level. No
signal abnormality within the visualized distal spinal cord.

Paraspinal and other soft tissues: Small right renal cysts.
Paraspinal soft tissues unremarkable.

Disc levels:

Mild disc degeneration within the lumbar or visualized lower
thoracic spine.

Congenitally narrow lumbar spinal canal on the basis of short
pedicles.

T10-T11: Imaged sagittally. Facet arthrosis/ligamentum flavum
hypertrophy. 4 mm bony retropulsion at the level of the T11 superior
endplate. Resultant moderate spinal canal stenosis. There is contact
upon the ventral aspect of the spinal cord with mild spinal cord
flattening (series 3, image 9). Apparent mild bilateral neural
foraminal narrowing.

T11-T12: Imaged sagittally. Facet arthrosis/ligamentum flavum
hypertrophy. No significant disc herniation or stenosis.

T12-L1: Slight disc bulge. Minimal facet arthrosis. No significant
spinal canal or foraminal stenosis.

L1-L2: Small disc bulge. Mild facet arthrosis/ligamentum flavum
hypertrophy. Mild bilateral subarticular and central canal narrowing
(without appreciable nerve root impingement. Mild right

Neural foraminal narrowing

L2-L3: Minimal bony retropulsion. Disc bulge. Moderate facet
arthrosis with ligamentum flavum hypertrophy. Epidural lipomatosis.
Severe spinal canal stenosis with impingement of the descending
cauda equina nerve roots. Bilateral neural foraminal narrowing
(moderate right, severe left).

L3-L4: Bony retropulsion, greatest at the level of the L3 inferior
endplate. Advanced facet arthrosis with ligamentum flavum
hypertrophy. Epidural lipomatosis. Severe spinal canal stenosis with
impingement of the descending cauda equina nerve roots at the L3
vertebral body level and L3-L4 disc level. Severe spinal canal
stenosis is also present at the L4 vertebral body level with
epidural lipomatosis contributing significantly. There is also
severe spinal canal stenosis at the L4 vertebral body level with
epidural lipomatosis contributing significantly at this level.
Severe bilateral neural foraminal narrowing.

L4-L5: Disc bulge. Advanced facet arthrosis with ligamentum flavum
hypertrophy. Markedly severe spinal canal stenosis with impingement
of the descending cauda equina nerve roots. Bilateral neural
foraminal narrowing (moderate/severe right, severe left).

L5-S1: Bony expansion, soft tissue tumor extension and epidural
lipomatosis contribute to severe spinal canal stenosis with
impingement of the descending cauda equina nerve roots at the L5
vertebral body level. At the L5-S1 disc level, there is a disc bulge
with endplate spurring. Superimposed shallow broad-based center/left
subarticular disc protrusion at site of posterior annular fissure.
Mild facet arthrosis. Minimal ligamentum flavum hypertrophy. The
disc protrusion contributes to mild left subarticular narrowing,
contacting and with possible contact upon the descending left S1
nerve root. No significant foraminal stenosis.
IMPRESSION: Findings compatible with diffuse osseous metastatic disease. There
is fairly extensive soft tissue tumor replacement of the L5
posterior elements, most notably involving the spinous process and
bilateral articular pillars/laminae.

Background diffuse abnormal T1 hypointense marrow signal suggesting
chronic anemia.

Presumed pathologic compression fractures at T11, T12, L2, L3 and
L4. Multilevel bony retropulsion, greatest at the level of the L3
inferior endplate (measuring 5 mm at this site).

Lumbar spondylosis, epidural lipomatosis and congenitally narrow
lumbar spinal canal.

Multilevel multifactorial spinal canal stenosis, as detailed. Most
notably, there is multifactorial severe spinal canal stenosis with
cauda equina nerve root impingement at the L2-L3 disc level, L3
vertebral body level, L3-L4 disc level, L4 vertebral body level,
L4-L5 disc level and L5 vertebral body level. Notably, soft tissue
tumor encroachment contributes to severe spinal canal stenosis at
the L5 level.

Also of note, bony retropulsion contributes to moderate spinal canal
stenosis at T10-T11. There is contact upon the ventral spinal cord
(with mild spinal cord flattening) at this level.

Multilevel foraminal stenosis, as detailed and greatest on the left
at L2-L3 (severe), bilaterally at L3-L4 (severe) and bilaterally at
L4-L5 (moderate/severe right, severe left).

## 2021-07-01 IMAGING — CR DG LUMBAR SPINE 2-3V
3 series · 3 of 3 positions shown · non-contrast
Comparison: None.

CLINICAL DATA: Low back pain and right leg weakness.

EXAM:
LUMBAR SPINE - 2-3 VIEW

[l-spine ap]
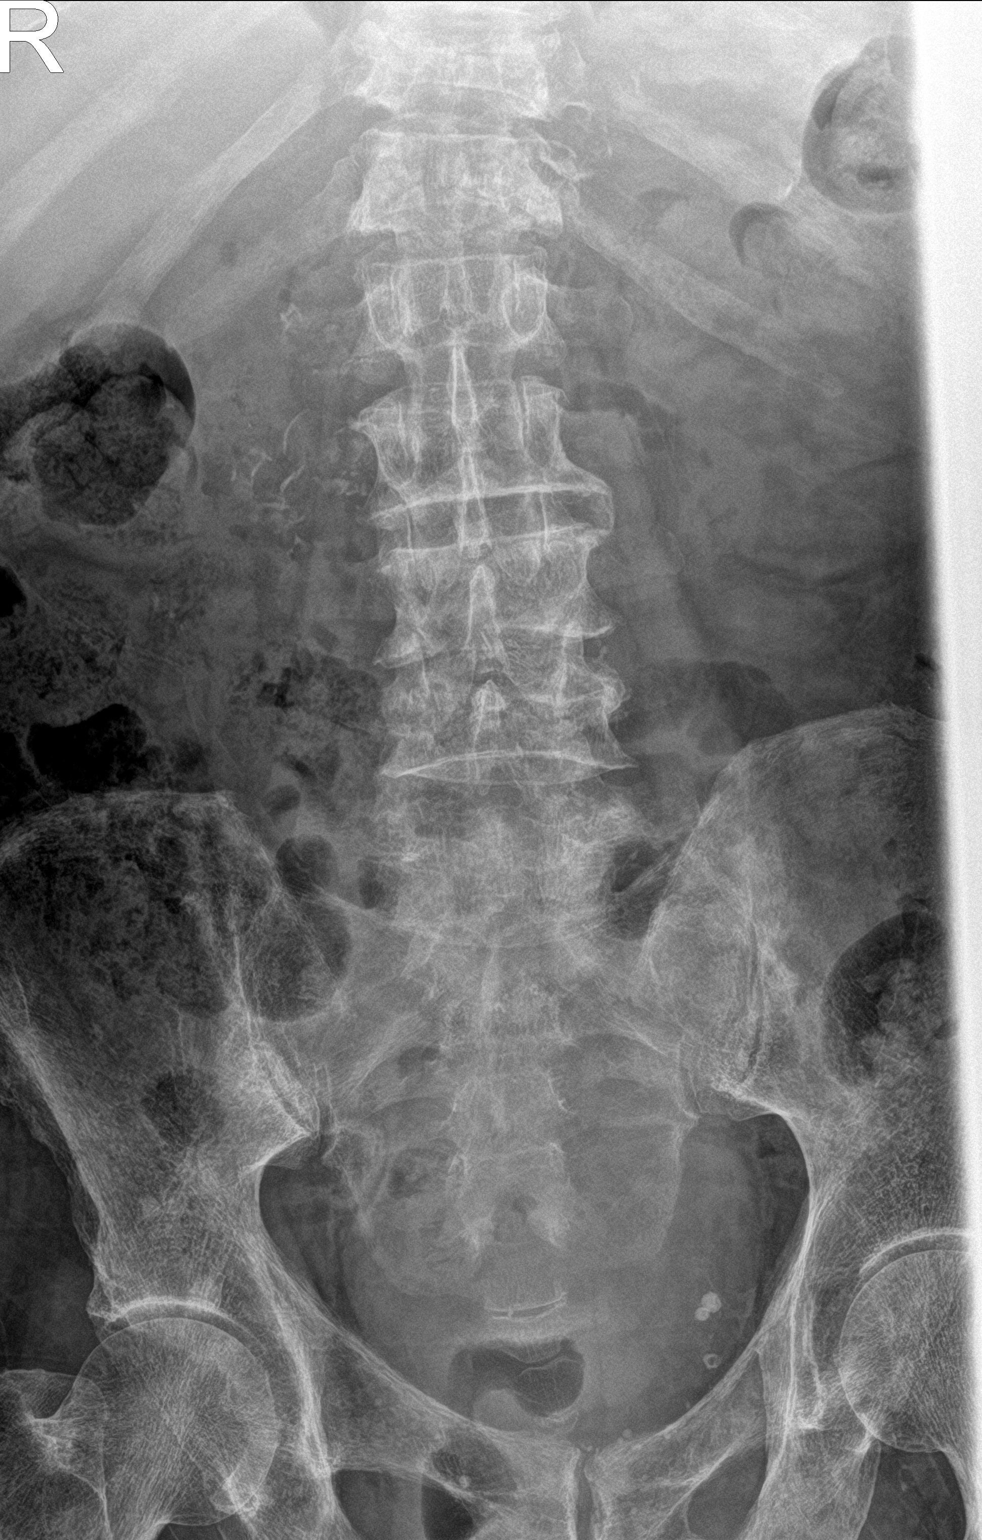

[l-spine lat]
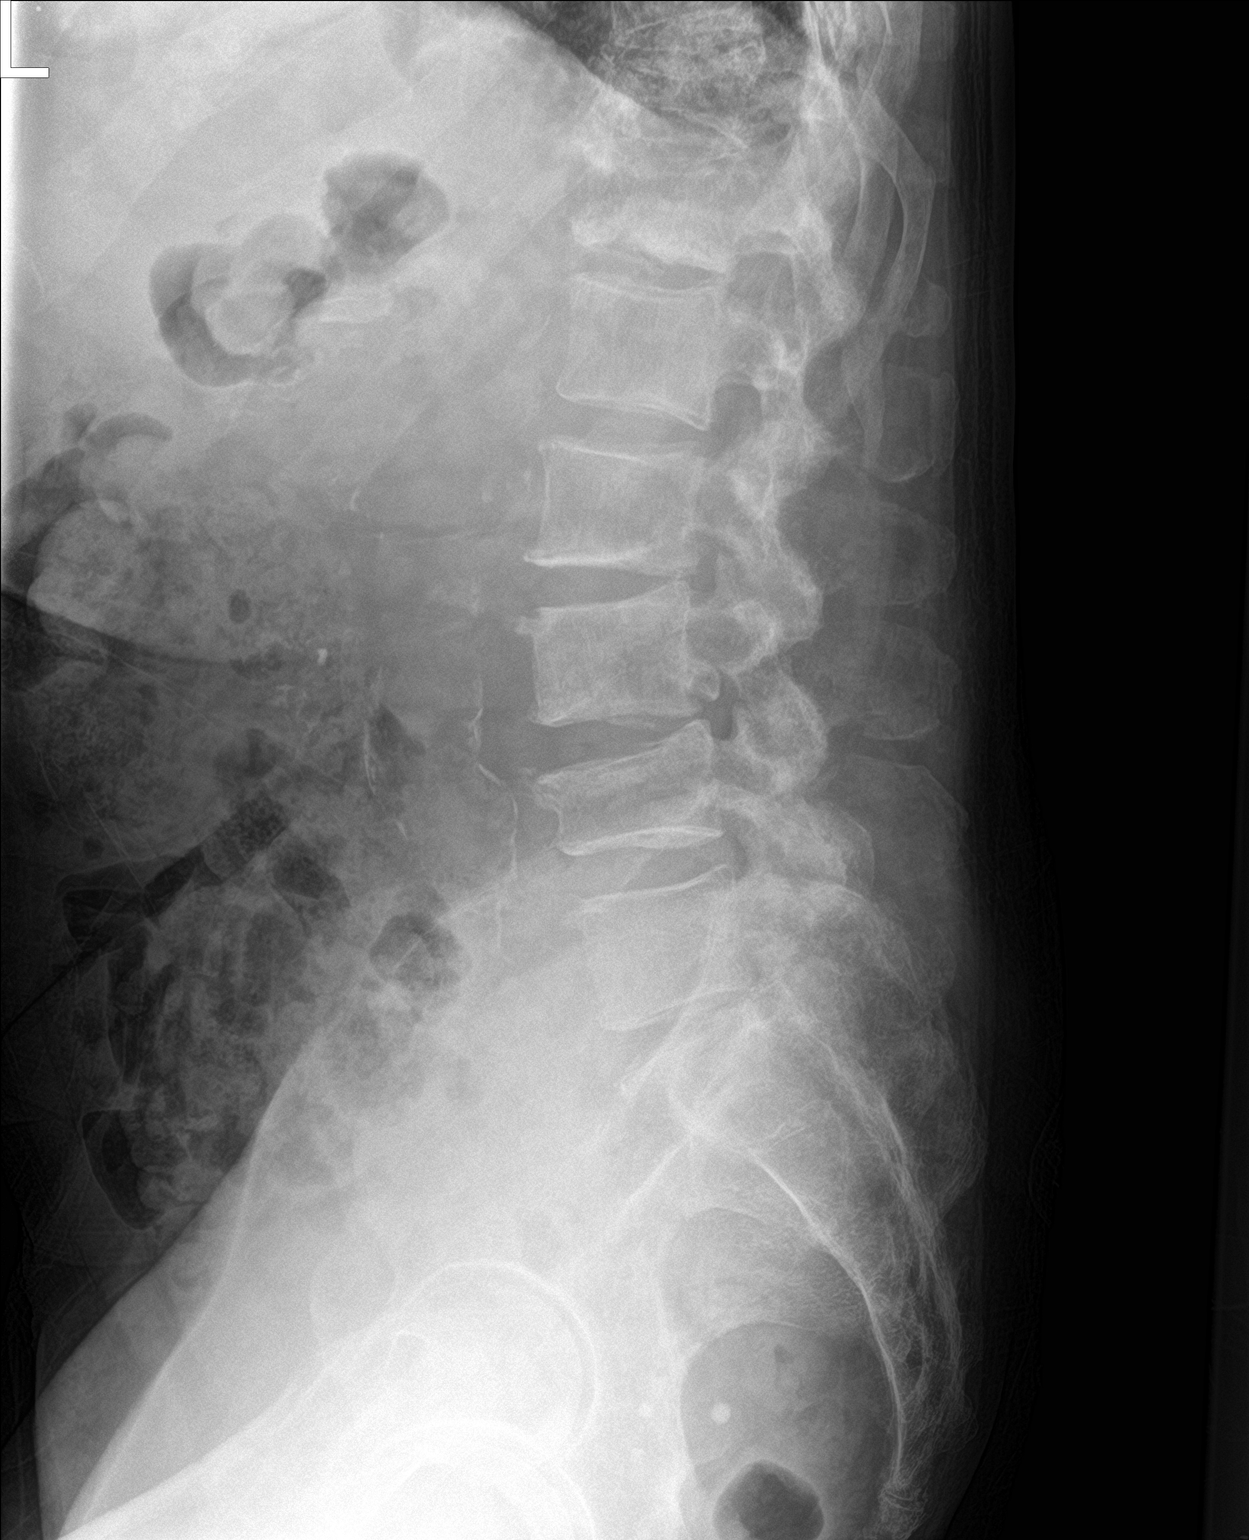

[l-spine spot]
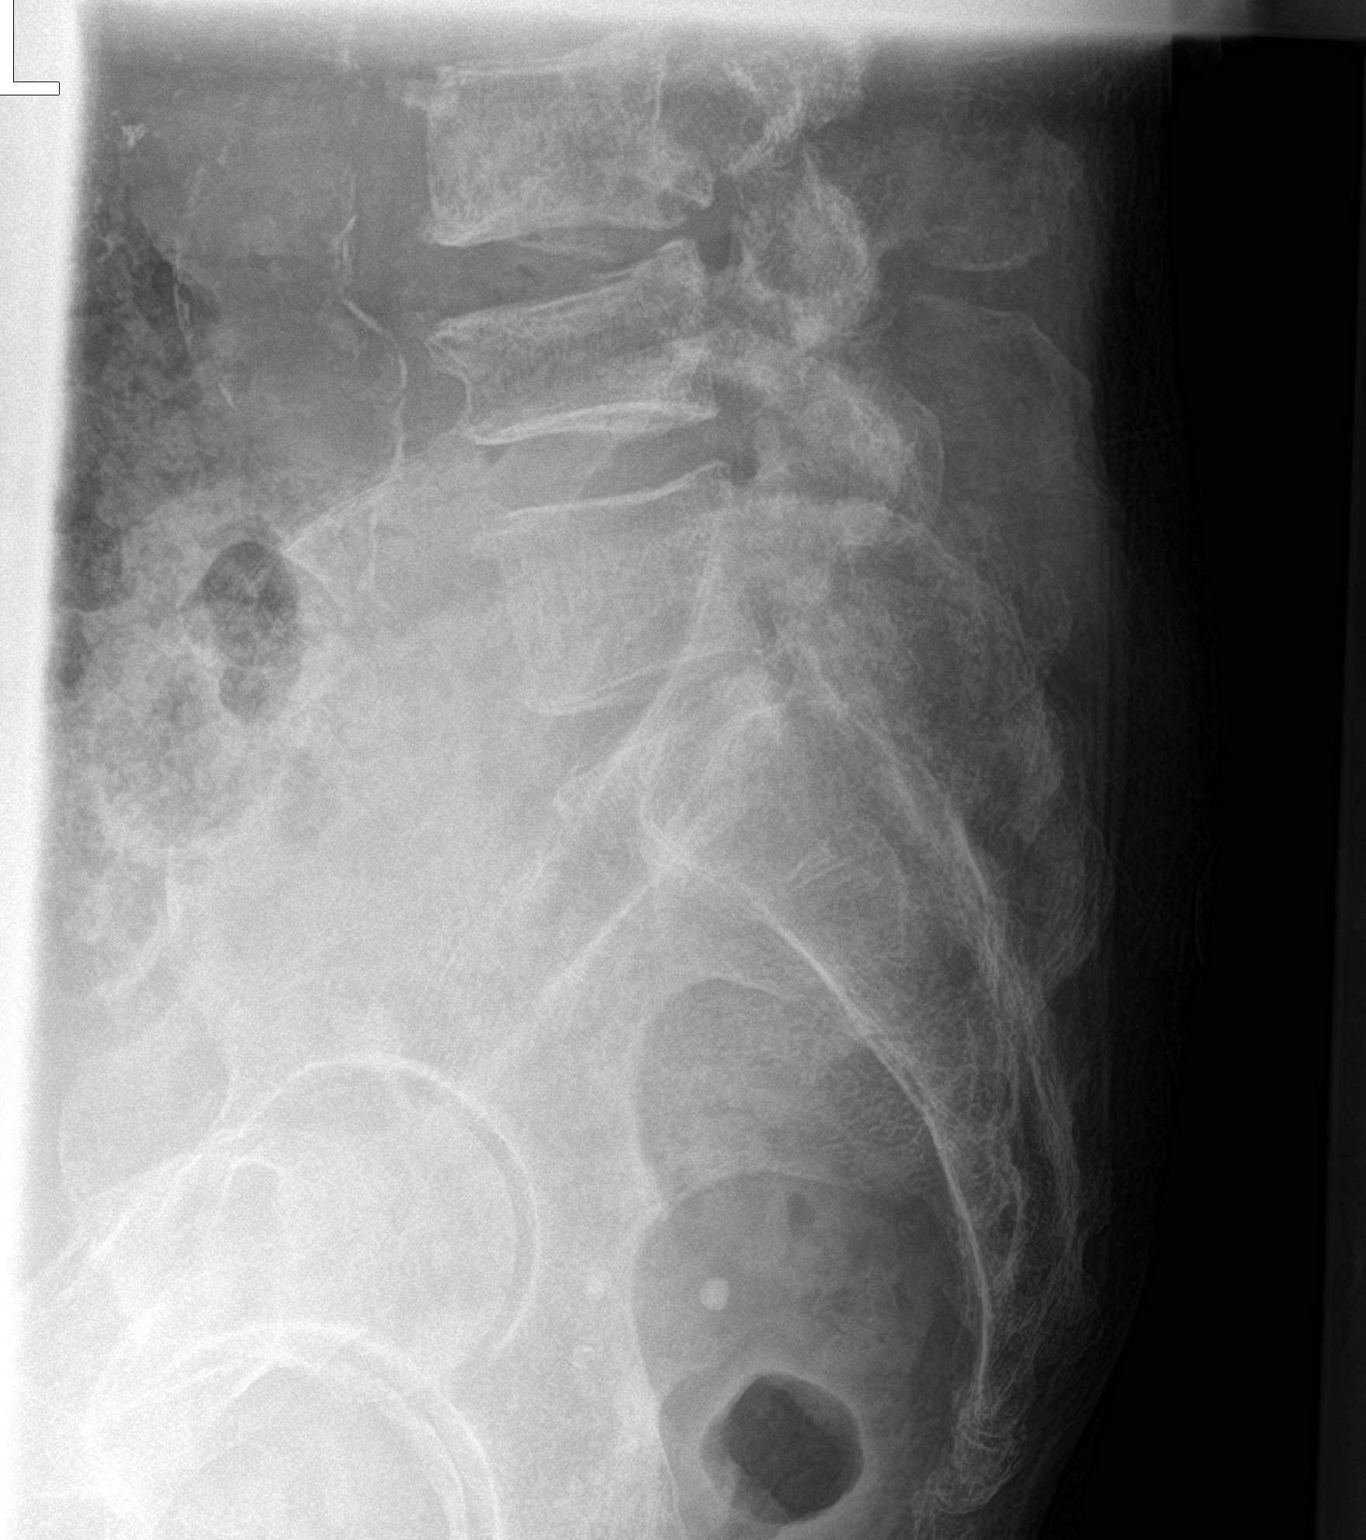

[3 of 3 positions shown; findings below may reference images not displayed]

FINDINGS: There is diffusely decreased mineralization of the bones. There are
compression deformities at T11, T12, and L4. T11 and T12 are not
well assessed due to limitations of exam. There is loss of vertebral
body height at L4 anteriorly of approximately 30%. Alignment is
within normal limits. Mild multilevel intervertebral disc space
narrowing, degenerative endplate changes and facet arthropathy is
noted. There is atherosclerotic calcification and aneurysmal
dilatation of the distal abdominal aorta measuring up to 3.1 cm in
diameter.
IMPRESSION: 1. New compression deformities at T11, T12, and L4, indeterminate in
age.
2. Multilevel degenerative changes.
3. Aortic atherosclerosis with aneurysmal dilatation of the distal
abdominal aorta measuring 3.1 cm.

## 2021-07-01 IMAGING — CT CT CHEST-ABD-PELV W/ CM
2 of 5 series · 13 of 36 positions shown, 15 images · IV contrast (omnipaque)
Comparison: None.

CLINICAL DATA: Unintentional weight loss, elevated serum ferritin,
fatigue

EXAM:
CT CHEST, ABDOMEN, AND PELVIS WITH CONTRAST
TECHNIQUE: Multidetector CT imaging of the chest, abdomen and pelvis was
performed following the standard protocol during bolus
administration of intravenous contrast.
CONTRAST:  100mL OMNIPAQUE IOHEXOL 300 MG/ML  SOLN

[Series 3: cap with 5mm st · axial · 0.89mm/px · z∈[+966,+1501]mm · 10 of 131 slices shown, 12 images]
[im 12/131  mediastinal]
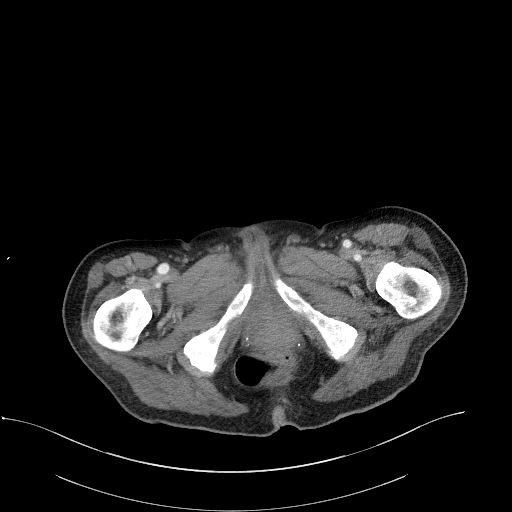
[im 12/131  bone]
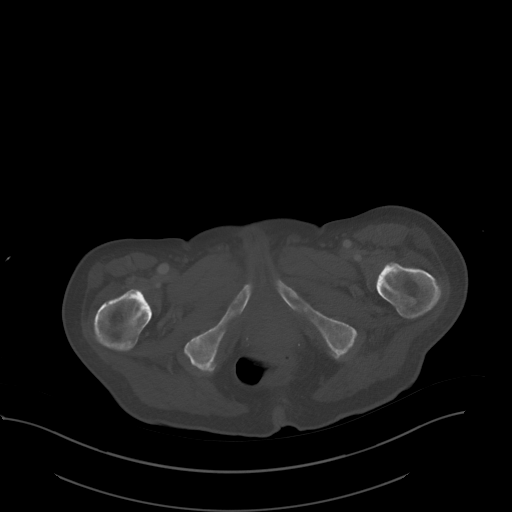
[im 24/131  mediastinal]
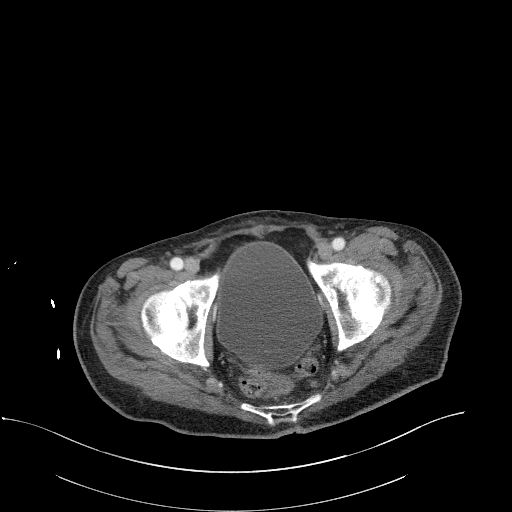
[im 36/131  mediastinal]
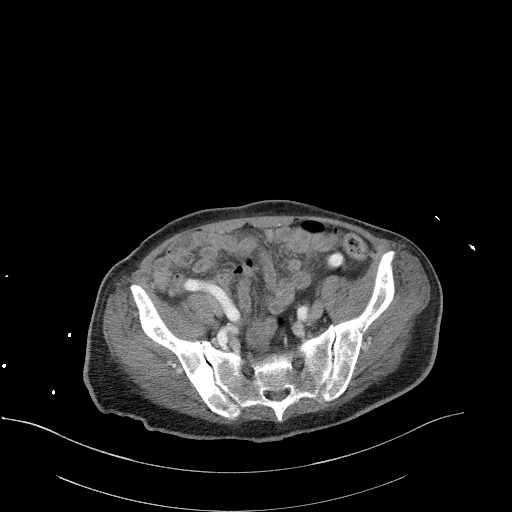
[im 48/131  mediastinal]
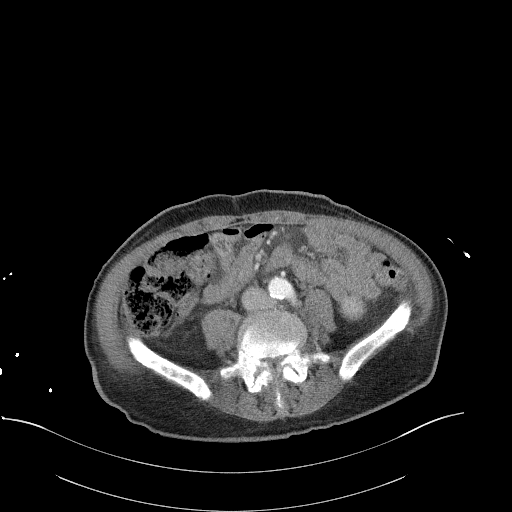
[im 60/131  mediastinal]
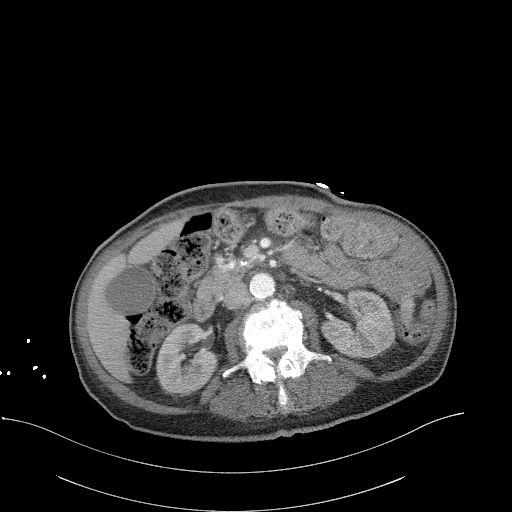
[im 71/131  mediastinal]
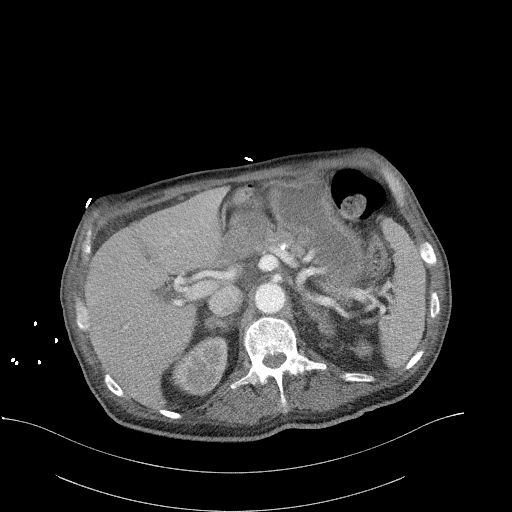
[im 83/131  mediastinal]
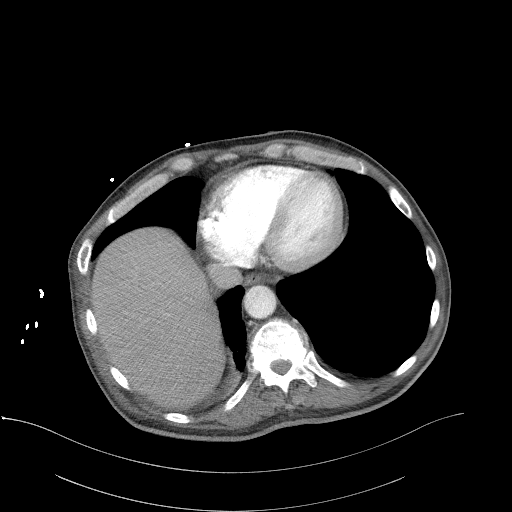
[im 95/131  mediastinal]
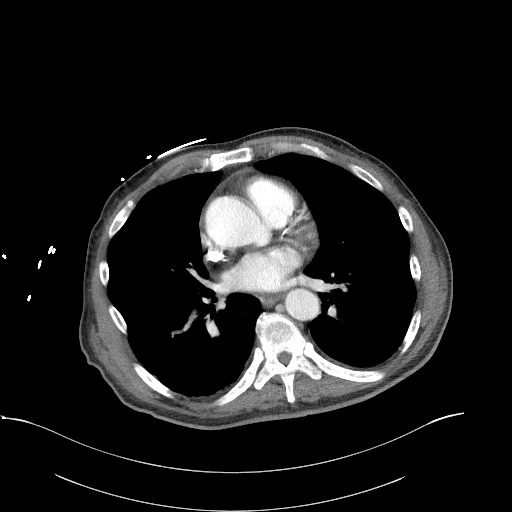
[im 107/131  mediastinal]
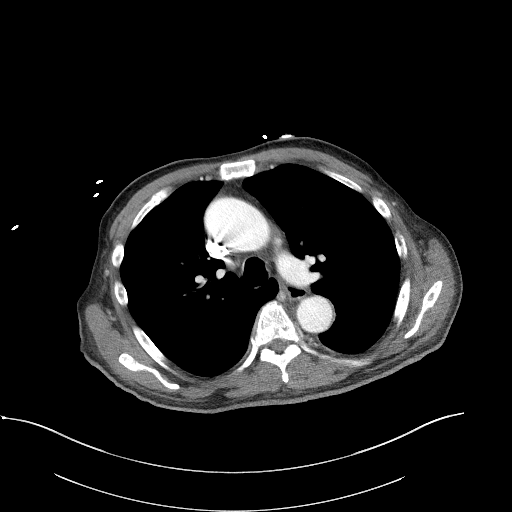
[im 107/131  bone]
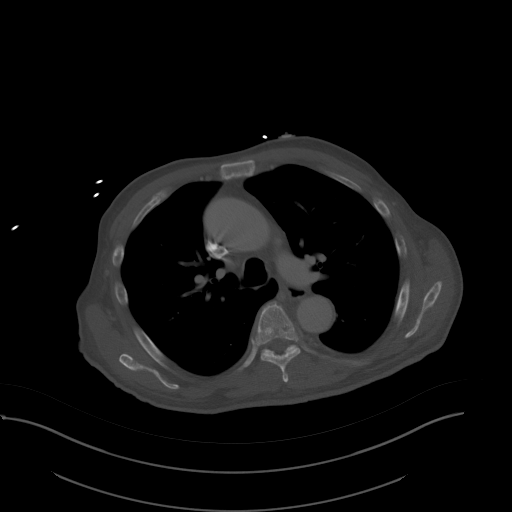
[im 119/131  mediastinal]
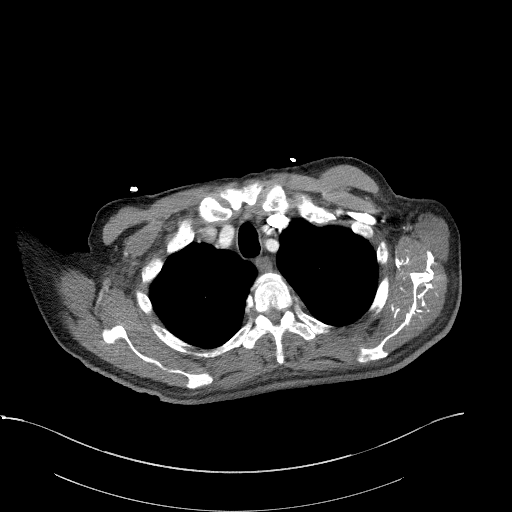

[Series 6: cap with 3mm st cor · coronal · 0.82mm/px · 3 of 165 slices shown]
[im 33/165  mediastinal]
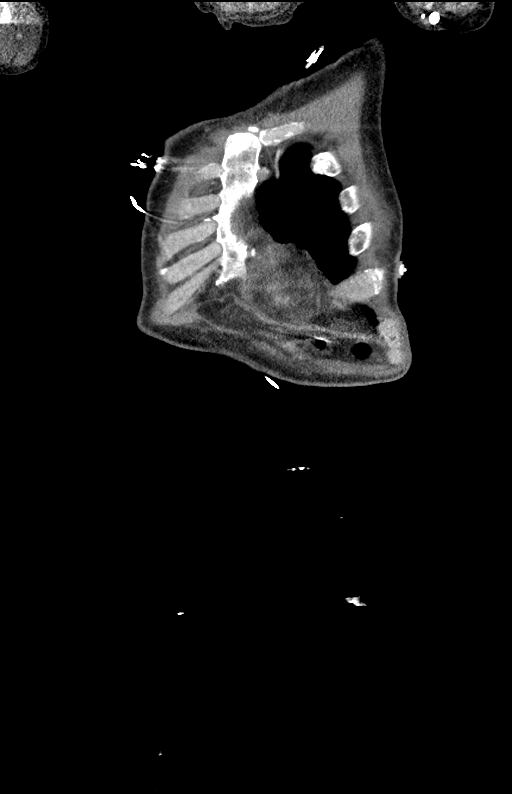
[im 66/165  mediastinal]
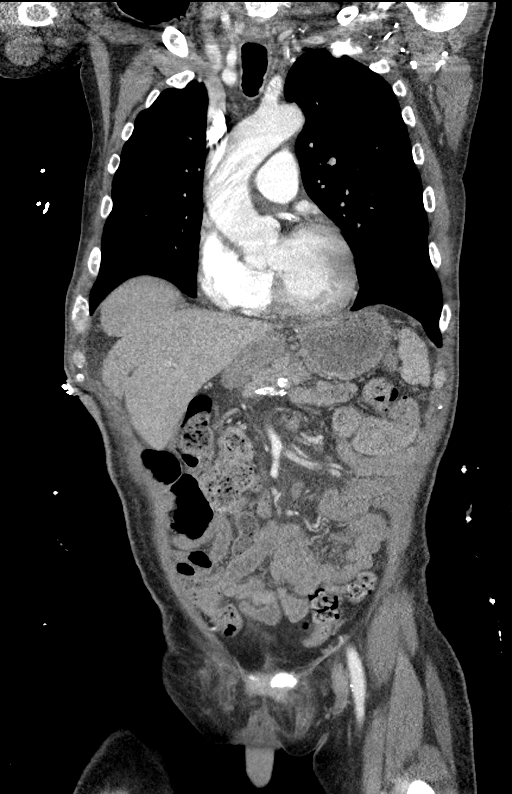
[im 99/165  mediastinal]
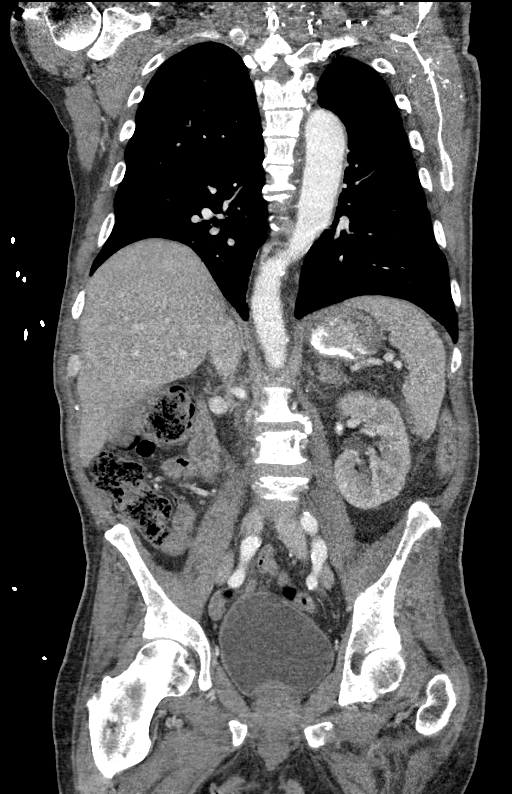

[13 of 36 positions shown; findings below may reference images not displayed]

FINDINGS: CT CHEST FINDINGS

Cardiovascular: Mild coronary artery calcification. Global cardiac
size within normal limits. No pericardial effusion. Central
pulmonary arteries are of normal caliber. The thoracic aorta is
dilated measuring 4.9 cm in its ascending segment and 3.2 cm in its
proximal descending segment. Distally, the descending thoracic aorta
is of normal caliber. Minimal atherosclerotic calcification.

Mediastinum/Nodes: No enlarged mediastinal, hilar, or axillary lymph
nodes. Thyroid gland, trachea, and esophagus demonstrate no
significant findings.

Lungs/Pleura: Mild right basilar atelectasis. Lungs are otherwise
clear. No pneumothorax or pleural effusion. Central airways are
widely patent.

Musculoskeletal: Pathologic fracture of the T11 vertebral body is
seen with approximately 50% loss of height and retropulsion of the
posterosuperior aspect of the vertebral body by approximately 3 mm.
No significant resultant canal stenosis. The posterior elements
appear intact. A a pathologic fracture of the inferior endplate of
T12 is also identified with minimal loss of height. Sclerotic
metastases are seen scattered throughout the thoracic spine, best
noted within the T5 vertebral body. There are numerous mixed lytic
and sclerotic metastases within the ribs bilaterally with pathologic
fracture the left third, fourth and right seventh ribs.

CT ABDOMEN PELVIS FINDINGS

Hepatobiliary: No focal liver abnormality is seen. No gallstones,
gallbladder wall thickening, or biliary dilatation.

Pancreas: Unremarkable

Spleen: Unremarkable

Adrenals/Urinary Tract: The adrenal glands are unremarkable. The
kidneys are normal in size and position. Tiny cortical hypodensity
within the interpolar region of the left kidney is not well
characterized on this examination but likely represents a tiny
cortical cyst. Multiple nonobstructing calculi are seen within the
right kidney measuring up to 3 mm. No ureteral calculi. No
hydronephrosis. The bladder is unremarkable.

Stomach/Bowel: Stomach is within normal limits. Appendix appears
normal. No evidence of bowel wall thickening, distention, or
inflammatory changes.

Vascular/Lymphatic: Moderate aortoiliac atherosclerotic
calcification. No aortic aneurysm. No pathologic adenopathy within
the abdomen and pelvis.

Reproductive: The prostate gland is moderately enlarged.

Other: No abdominal wall hernia.  Rectum unremarkable.

Musculoskeletal: There are innumerable mixed lytic and sclerotic
metastases noted throughout the lumbar spine and pelvis with
pathologic fracture of the L2, L3, and L4 vertebral bodies. There is
retropulsion of the posteroinferior aspect of the L3 vertebral body
by approximately 8 mm which results in marked central canal
stenosis. While many of the osseous metastases do not demonstrate
significant soft tissue component, there is extensive involvement of
the spinous process of L5 which demonstrates a notable soft tissue
component bilaterally best seen on axial image # 86/3. There is
severe multifactorial canal stenosis at L4-5 noted.
IMPRESSION: Widespread osseous metastatic disease of unknown primary. Numerous
pathologic fractures involving the ribs bilaterally as well as the
T11, T12, L2, L3, and L4 vertebral bodies. Retropulsion of T11 and
L3 results in severe central canal stenosis at L3. Multifactorial
severe central canal stenosis also noted at L4, in part related to
metastatic disease. Prominent soft tissue component involving the
metastasis involving the spinous process of L[DATE] provide a a
appropriate target for tissue sampling for further evaluation.

Mild coronary artery calcification.

Thoracic aortic aneurysm with maximal transaxial dimension of
cm. Ascending thoracic aortic aneurysm. Recommend semi-annual
imaging followup by CTA or MRA and referral to cardiothoracic
surgery if not already obtained. This recommendation follows [TW]
ACCF/AHA/AATS/ACR/ASA/SCA/ARIZONA/ARIZONA/ARIZONA/ARIZONA Guidelines for the
Diagnosis and Management of Patients With Thoracic Aortic Disease.
Circulation. [TW]; 121: [id number]. Aortic aneurysm NOS ([TW]-[TW])

Moderate enlargement of the prostate gland. Correlation with serum
PSA may be helpful.

## 2021-07-01 IMAGING — CT CT BIOPSY
1 of 3 series · 11 of 32 positions shown, 17 images · non-contrast
Comparison: CT CAP, [DATE].

INDICATION: Metastatic disease of unknown primary.  Lumbar paraspinous mass.

EXAM:
CT-GUIDED BIOPSY OF L5 PARASPINOUS MASS

[Series 4: i-spiral 5.0 b40f post biopsy · axial · 0.98mm/px · z∈[-342,-238]mm · 11 of 38 slices shown, 17 images]
[im 4/38  soft-tissue]
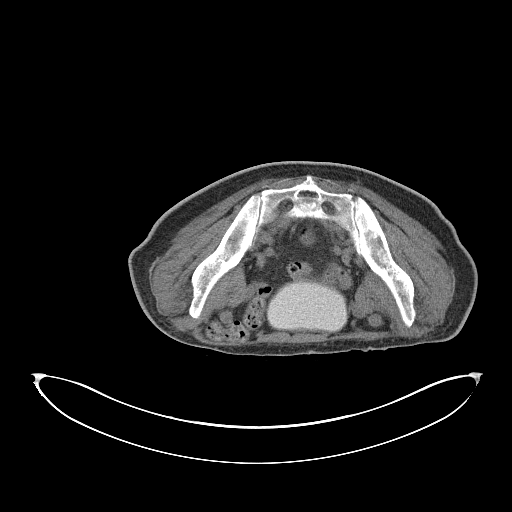
[im 4/38  bone]
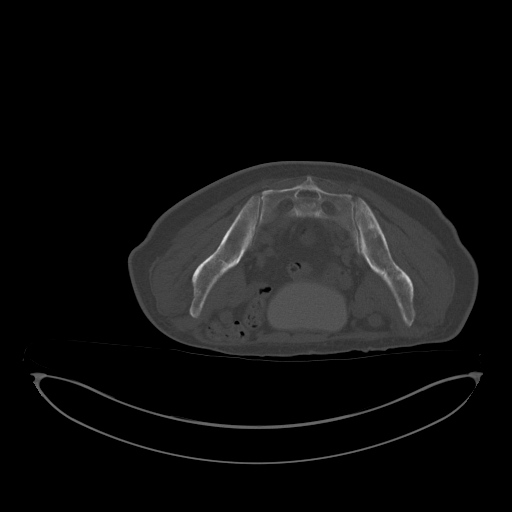
[im 7/38  soft-tissue]
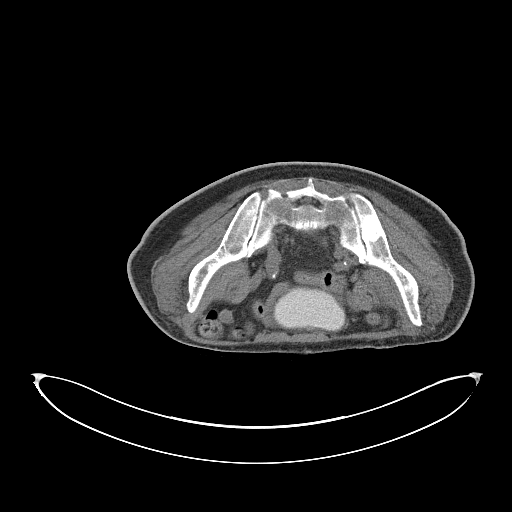
[im 10/38  soft-tissue]
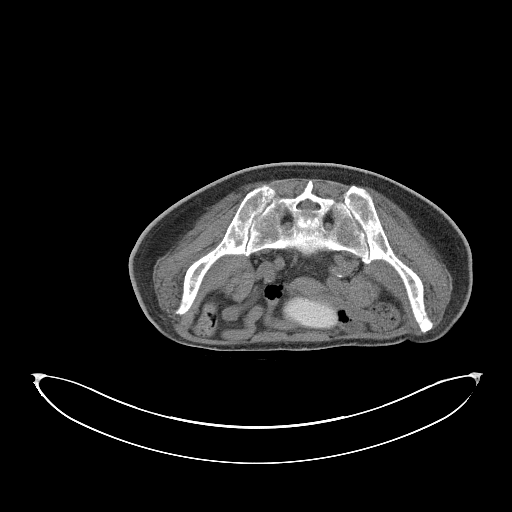
[im 13/38  soft-tissue]
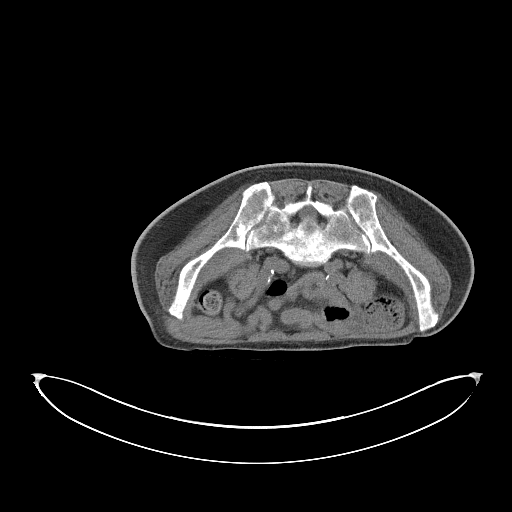
[im 16/38  soft-tissue]
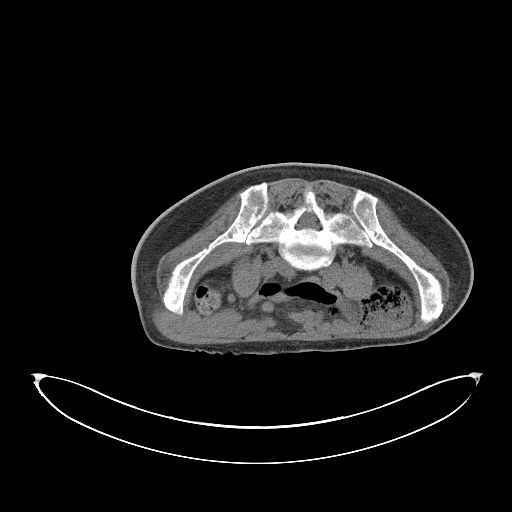
[im 19/38  soft-tissue]
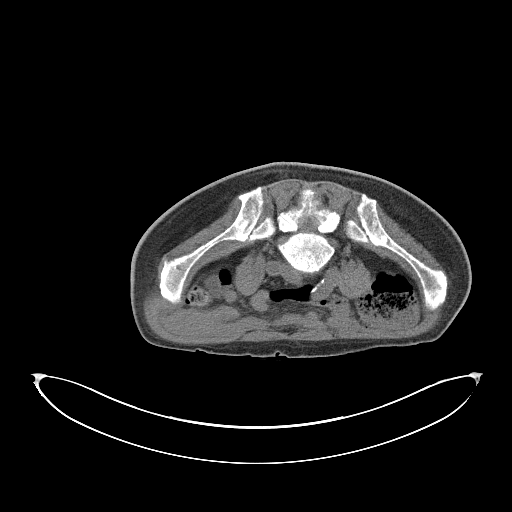
[im 22/38  soft-tissue]
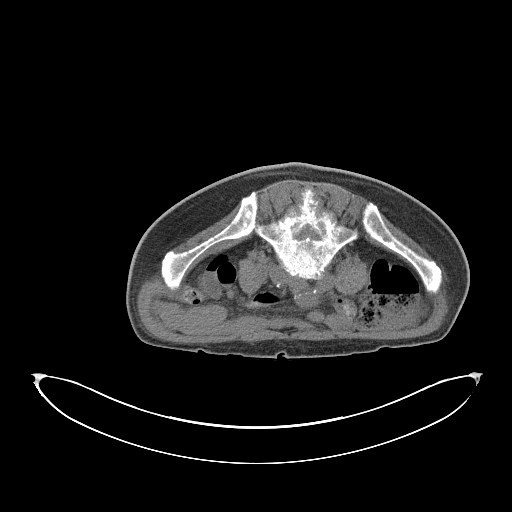
[im 25/38  soft-tissue]
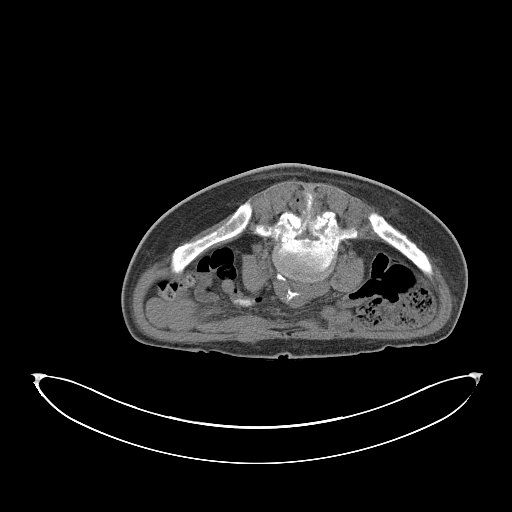
[im 25/38  lung]
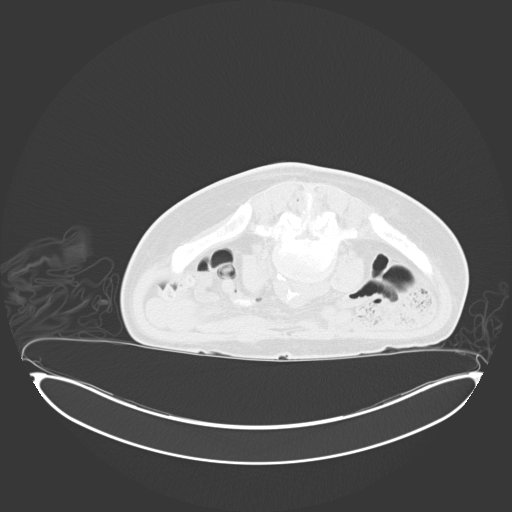
[im 28/38  soft-tissue]
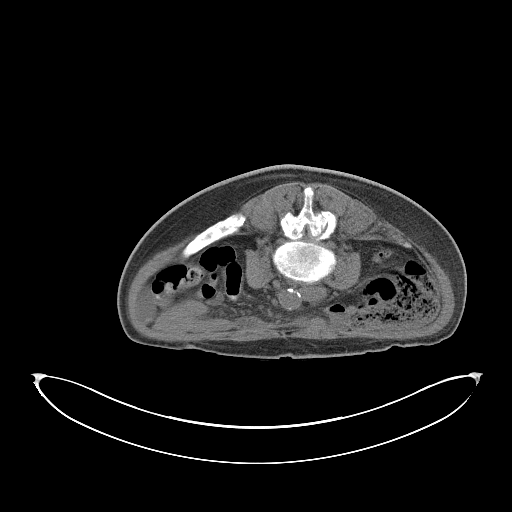
[im 28/38  lung]
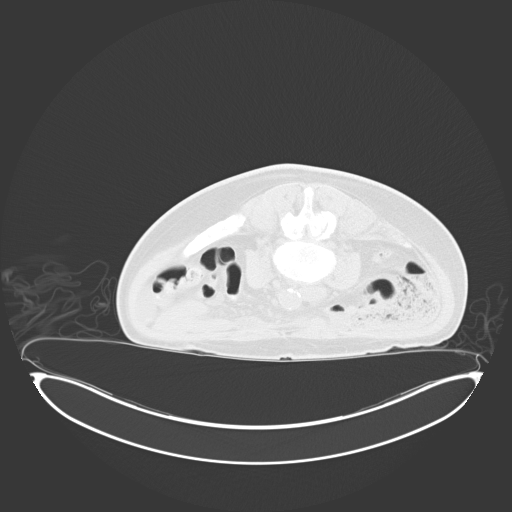
[im 28/38  bone]
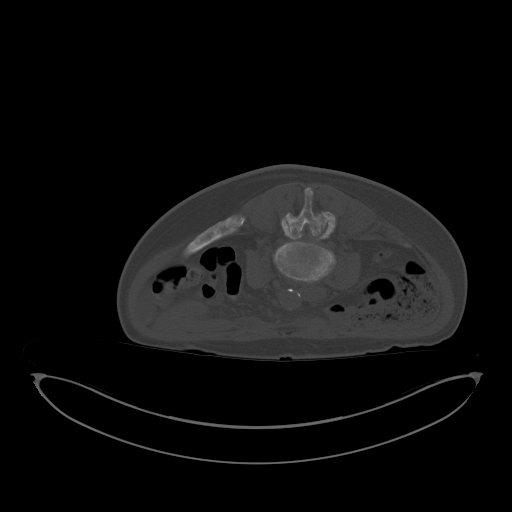
[im 31/38  soft-tissue]
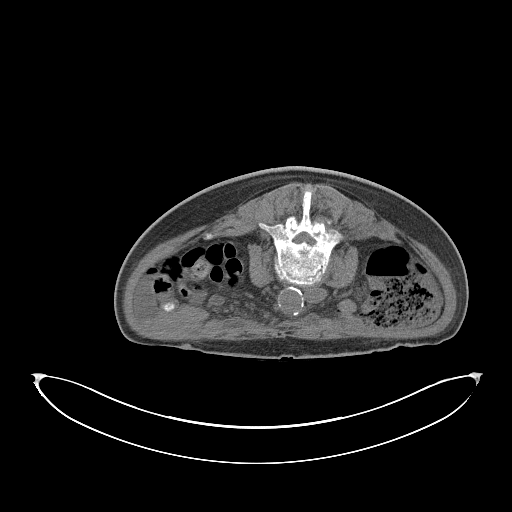
[im 31/38  lung]
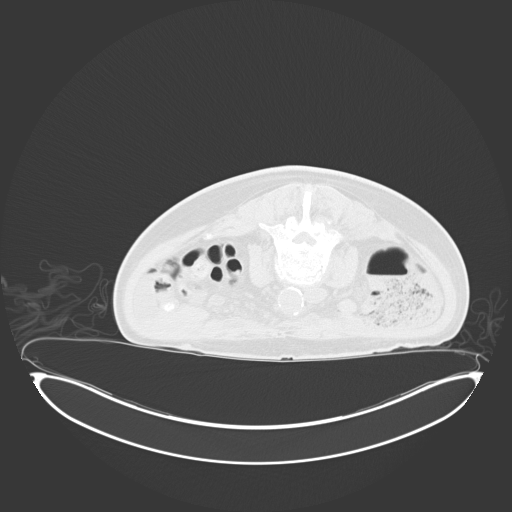
[im 34/38  soft-tissue]
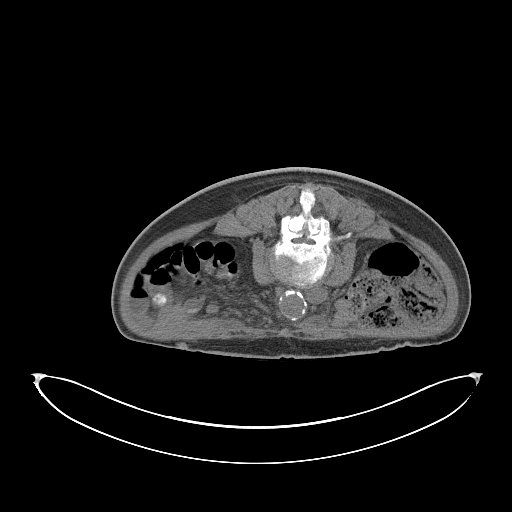
[im 34/38  lung]
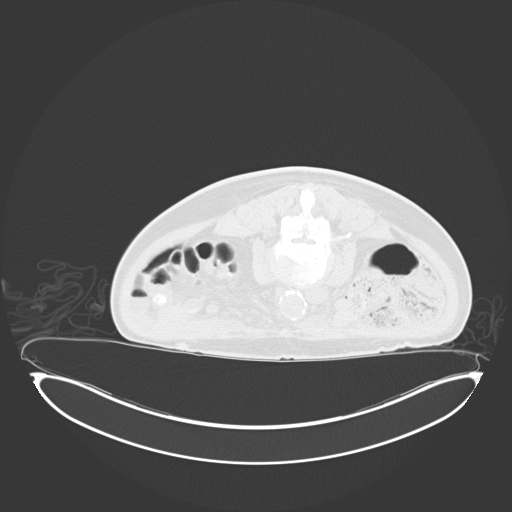

[11 of 32 positions shown; findings below may reference images not displayed]

MR KHMILI, same day.

MEDICATIONS:
None.

ANESTHESIA/SEDATION:
Fentanyl 50 mcg IV; Versed 1 mg IV

Sedation time: 27 minutes; The patient was continuously monitored
during the procedure by the interventional radiology nurse under my
direct supervision.

CONTRAST:  None.

COMPLICATIONS:
None immediate.

PROCEDURE:
Informed consent was obtained from the patient following an
explanation of the procedure, risks, benefits and alternatives. A
time out was performed prior to the initiation of the procedure.

The patient was positioned prone on the CT table and a limited CT
was performed for procedural planning demonstrating L5 paraspinous
mass. The procedure was planned. The operative site was prepped and
draped in the usual sterile fashion. Appropriate trajectory was
confirmed with a 22 gauge spinal needle after the adjacent tissues
were anesthetized with 1% Lidocaine with epinephrine.

Under intermittent CT guidance, a 17 gauge coaxial needle was
advanced into the peripheral aspect of the mass.

Appropriate positioning was confirmed and 3 samples were obtained
with an 18 gauge core needle biopsy device. The co-axial needle was
removed and hemostasis was achieved with manual compression.

A limited postprocedural CT was negative for hemorrhage or
additional complication. A dressing was placed. The patient
tolerated the procedure well without immediate postprocedural
complication.
IMPRESSION: Successful CT-guided core needle biopsy of L5 paraspinous mass, as
above.

## 2021-07-01 IMAGING — MR MR HEAD W/O CM
6 of 10 series · 28 of 48 positions shown · non-contrast
Comparison: None.

CLINICAL DATA: Metastatic disease evaluation.

EXAM:
MRI HEAD WITHOUT CONTRAST
TECHNIQUE: Multiplanar, multiecho pulse sequences of the brain and surrounding
structures were obtained without intravenous contrast.

[Series 2: DWI · axial · 3.0mm · 0.94mm/px · z∈[-79,+67]mm · 8 of 100 slices shown (1 of 2)]
[im 1/100]
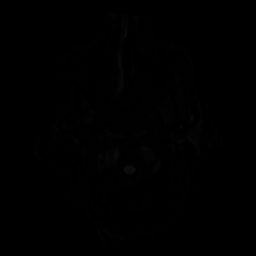
[im 12/100]
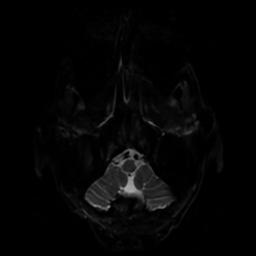
[im 34/100]
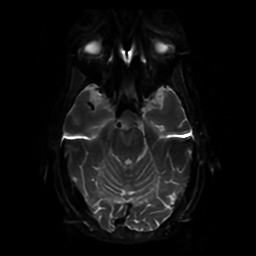
[im 45/100]
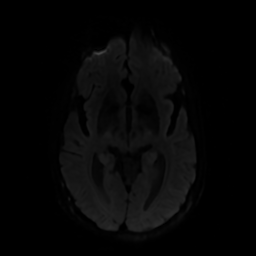
[im 56/100]
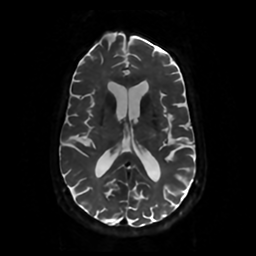
[im 67/100]
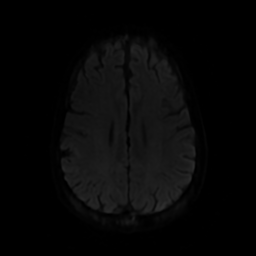
[im 89/100]
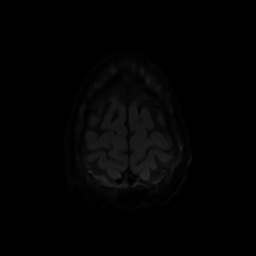
[im 100/100]
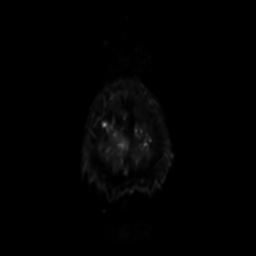

[Series 3: DWI · coronal · 4.0mm · 0.94mm/px · 8 of 76 slices shown (2 of 2)]
[im 1/76]
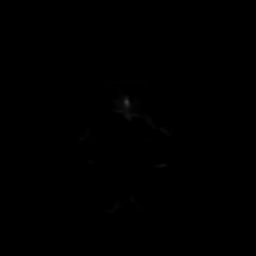
[im 11/76]
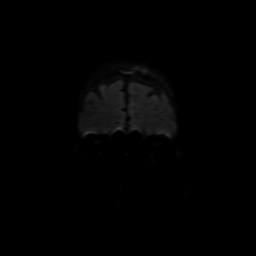
[im 22/76]
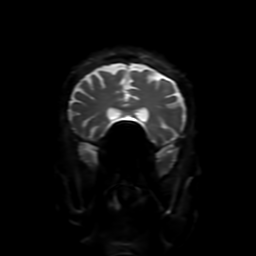
[im 33/76]
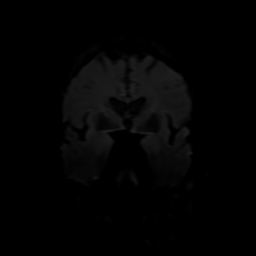
[im 43/76]
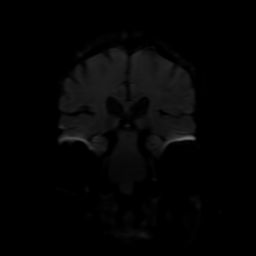
[im 54/76]
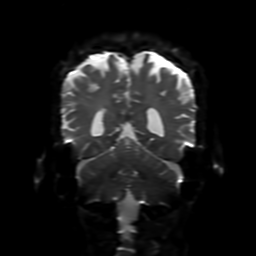
[im 65/76]
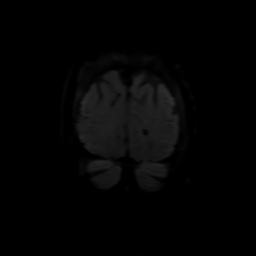
[im 76/76]
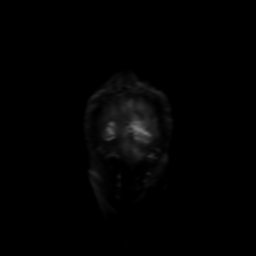

[Series 4: FLAIR · sagittal · 5.0mm · 0.23mm/px · 2 of 23 slices shown (1 of 2)]
[im 1/23]
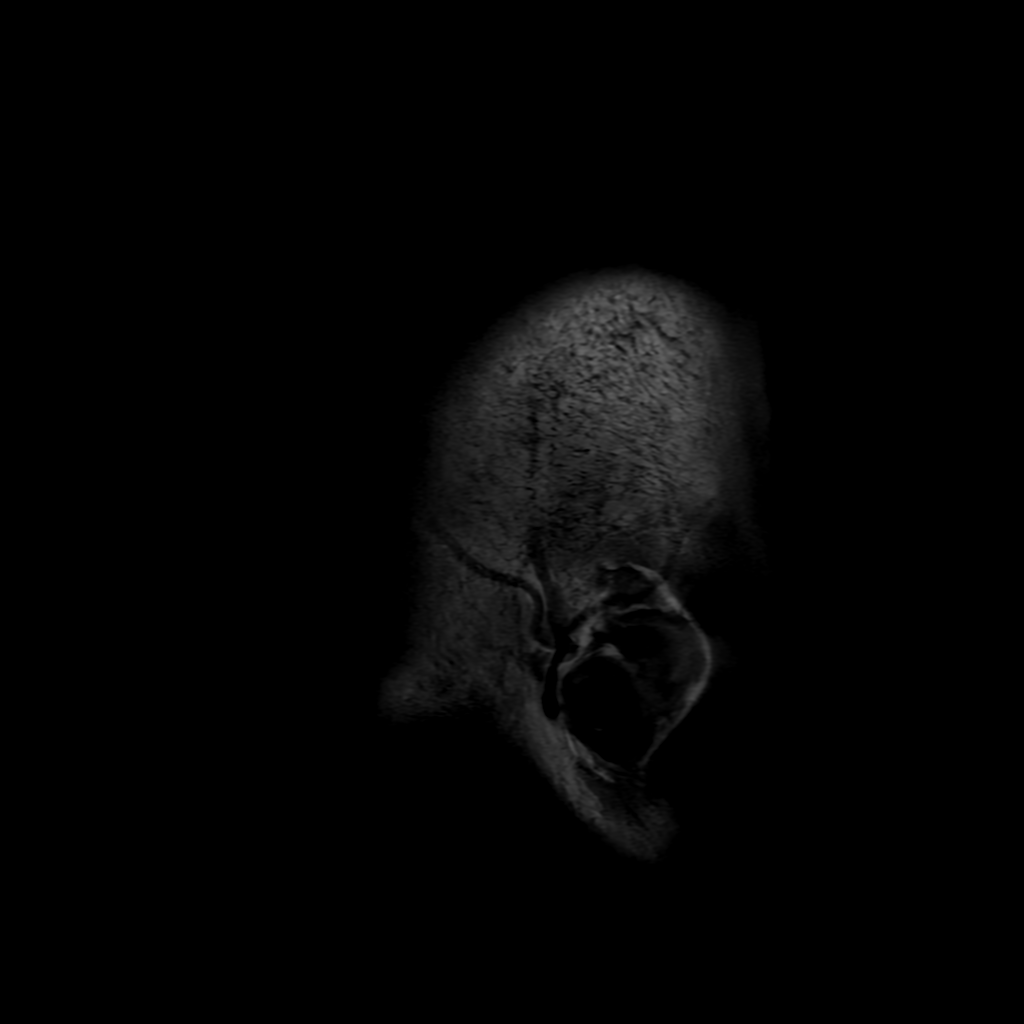
[im 23/23]
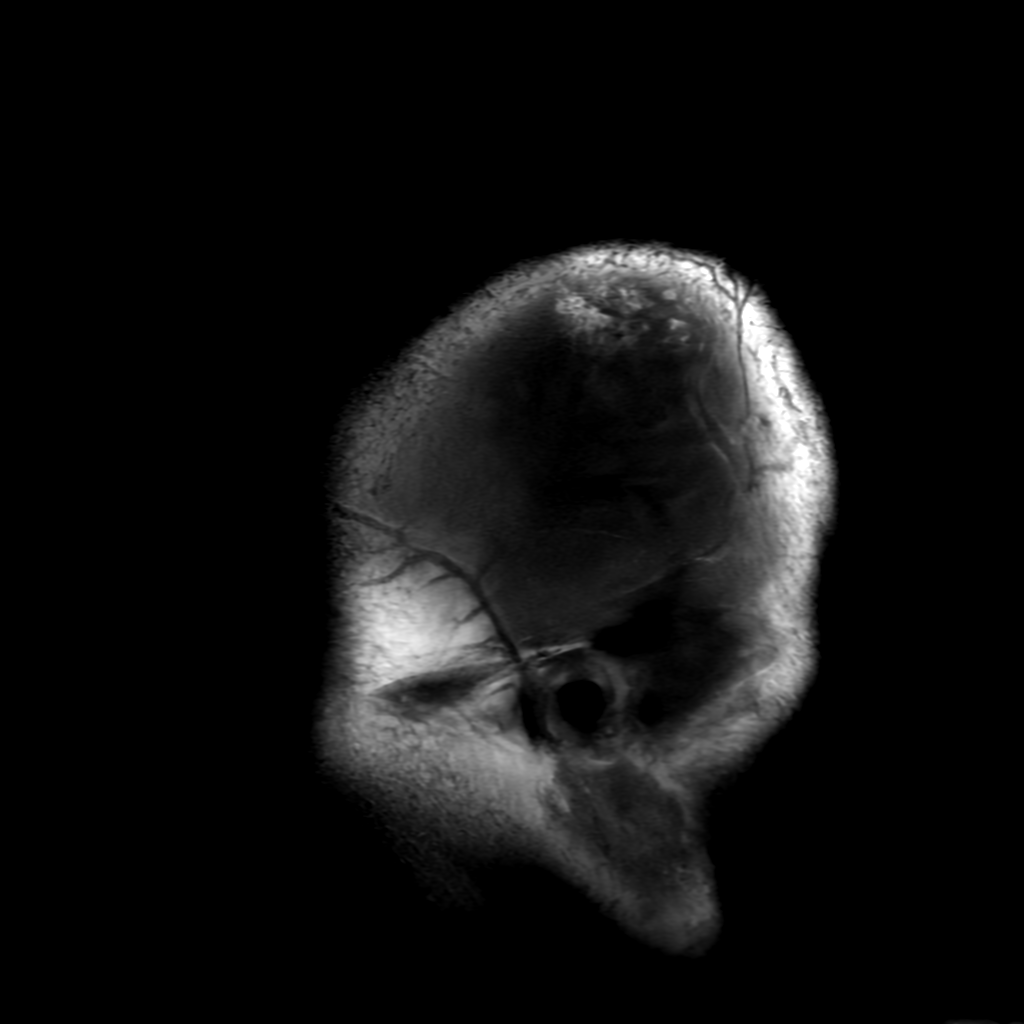

[Series 6: FLAIR · axial · 4.0mm · 0.45mm/px · z∈[-72,+68]mm · 3 of 33 slices shown (2 of 2)]
[im 1/33]
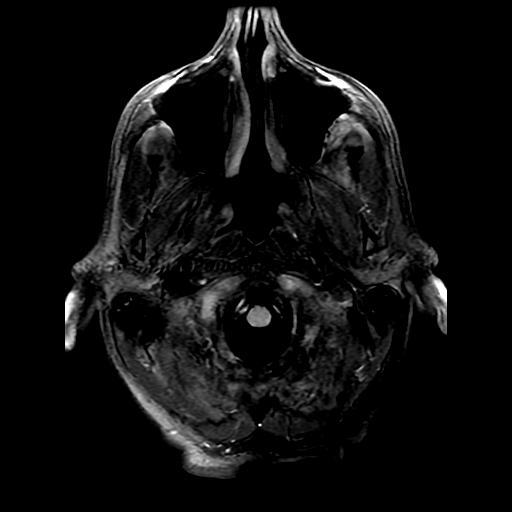
[im 17/33]
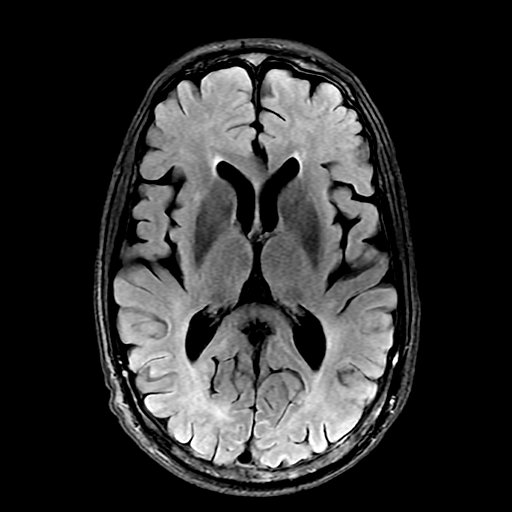
[im 33/33]
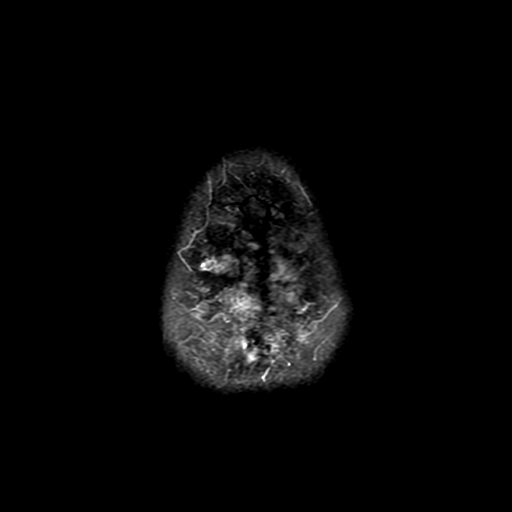

[Series 250: ADC · axial · 3.0mm · 0.94mm/px · z∈[-79,+67]mm · 4 of 47 slices shown (1 of 2)]
[im 1/47]
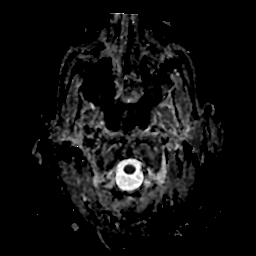
[im 16/47]
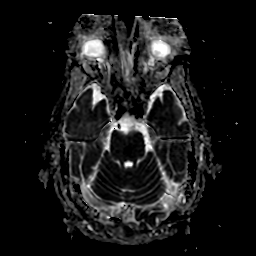
[im 31/47]
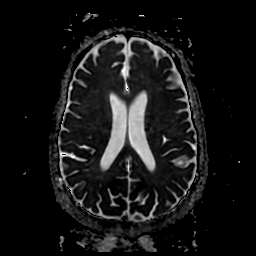
[im 47/47]
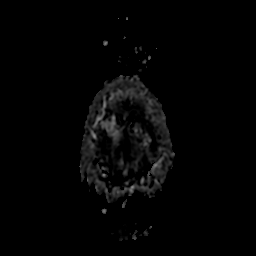

[Series 350: ADC · coronal · 4.0mm · 0.94mm/px · 3 of 35 slices shown (2 of 2)]
[im 1/35]
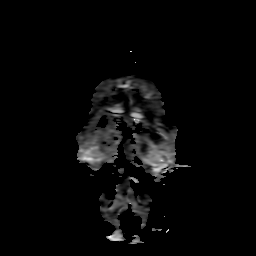
[im 18/35]
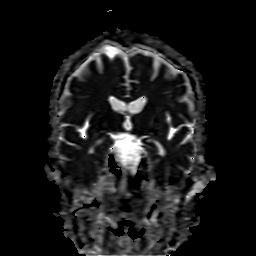
[im 35/35]
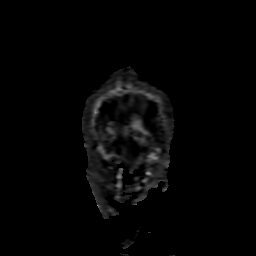

[28 of 48 positions shown; findings below may reference images not displayed]

FINDINGS: Brain: There is no evidence of an acute infarct, intracranial
hemorrhage, midline shift, or extra-axial fluid collection. Small T2
hyperintensities in the cerebral white matter bilaterally are
nonspecific but compatible with minimal chronic small vessel
ischemic disease. Mild cerebral atrophy is within normal limits for
age. No mass is identified, however absence of IV contrast limits
sensitivity for detection of small lesions.

Vascular: Major intracranial vascular flow voids are preserved.

Skull and upper cervical spine: Diffusely abnormal bone marrow
signal throughout the skull and included upper cervical spine.

Sinuses/Orbits: Unremarkable orbits. Trace bilateral mastoid fluid.
Clear paranasal sinuses.

Other: None.
IMPRESSION: 1. No evidence of intracranial metastases on this noncontrast study.
2. Diffusely abnormal bone marrow signal consistent with known
widespread osseous metastases.

## 2021-07-01 MED ORDER — IOHEXOL 300 MG/ML  SOLN
100.0000 mL | Freq: Once | INTRAMUSCULAR | Status: AC | PRN
  Administered 2021-07-01: 100 mL via INTRAVENOUS

## 2021-07-01 MED ORDER — MIDAZOLAM HCL 2 MG/2ML IJ SOLN
INTRAMUSCULAR | Status: AC | PRN
  Administered 2021-07-01: 1 mg via INTRAVENOUS

## 2021-07-01 MED ORDER — POTASSIUM CHLORIDE CRYS ER 20 MEQ PO TBCR
40.0000 meq | EXTENDED_RELEASE_TABLET | Freq: Once | ORAL | Status: AC
  Administered 2021-07-01: 40 meq via ORAL
  Filled 2021-07-01: qty 2

## 2021-07-01 MED ORDER — MIDAZOLAM HCL 2 MG/2ML IJ SOLN
INTRAMUSCULAR | Status: AC
  Filled 2021-07-01: qty 2

## 2021-07-01 MED ORDER — POTASSIUM CHLORIDE CRYS ER 20 MEQ PO TBCR
40.0000 meq | EXTENDED_RELEASE_TABLET | Freq: Two times a day (BID) | ORAL | Status: AC
  Administered 2021-07-01 (×2): 40 meq via ORAL
  Filled 2021-07-01 (×2): qty 2

## 2021-07-01 MED ORDER — FENTANYL CITRATE (PF) 100 MCG/2ML IJ SOLN
INTRAMUSCULAR | Status: AC
  Filled 2021-07-01: qty 2

## 2021-07-01 MED ORDER — FENTANYL CITRATE (PF) 100 MCG/2ML IJ SOLN
INTRAMUSCULAR | Status: AC | PRN
Start: 2021-07-01 — End: 2021-07-01
  Administered 2021-07-01: 50 ug via INTRAVENOUS

## 2021-07-01 MED ORDER — LIDOCAINE HCL 1 % IJ SOLN
INTRAMUSCULAR | Status: AC
  Filled 2021-07-01: qty 10

## 2021-07-01 NOTE — ED Notes (Signed)
Pt transported to IR 

## 2021-07-01 NOTE — Progress Notes (Addendum)
HD#1 SUBJECTIVE:  Patient Summary: Patrick [name] is a 71 y.o. with a pertinent PMH of HIV, HTN, and chronic lumbar DDD, who presented with anemia and hypotension and admitted for further workup and evaluation of probable prostate cancer with diffuse skeletal lytic lesions.   Overnight Events: No acute events overnight.   Interim History: This is hospital day 1 for Patrick [name] who was seen and evaluated at the bedside this morning. He states that his back pain feels improved this morning. He denies any feelings of dizziness/lightheadedness, but generally feels weak.   OBJECTIVE:  Vital Signs: Vitals:   07/01/21 1009 07/01/21 1030 07/01/21 1100 07/01/21 1130  BP: 113/73 112/74 107/75 101/71  Pulse: 88 87 88 88  Resp: 18 20 (!) 22 (!) 24  Temp:      TempSrc:      SpO2: 98% 100% 96% 97%   Supplemental O2: Room Air SpO2: 97 %  There were no vitals filed for this visit.   Intake/Output Summary (Last 24 hours) at 07/01/2021 1148 Last data filed at 07/01/2021 1011 Gross per 24 hour  Intake 1000 ml  Output 1550 ml  Net -550 ml   Net IO Since Admission: -550 mL [07/01/21 1148]  Physical Exam: General: Cachectic appearing male laying in bed. No acute distress. CV: RRR. No murmurs, rubs, or gallops. No LE edema Pulmonary: Lungs CTAB. Normal effort.  Abdominal: Soft, nontender, nondistended. Normal bowel sounds. Extremities: Palpable radial and DP pulses. Skin: Warm and dry.  Neuro: A&Ox3. 5/5 strength LUE, RUE, and LLE. 1/5 strength RLE, although plantar and dorsiflexion intact.  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 Initial workup in the ED showed diffuse skeletal lesions consistent with new widespread malignancy. PSA >3000, which makes a primary prostate cancer the most likely diagnosis at this point, will consult IR for bone biopsy to get a definitive diagnosis. Will obtain MRI brain to rule out CNS mets. Patient does have diminished strength in his RLE, but no evidence of bowel or bladder dysfunction, will get an MRI lumbar spine to rule out cord compression - MRI brain and lumbar spine pending - IR biopsy of L5 paraspinal tissue today - Will arrange for outpatient oncology/radiation onc follow up, pending results of biopsy  #AKI, resolved Cr elevated at 1.5, with baseline around 1.0. AKI likely secondary to poor oral intake. AKI resolved with IVF - Daily BMP  #Hypotension 2/2 volume depletion, resolved #Protein calorie malnutrition, moderate Patient initially presented with hypotension, which improved with IVF. Protein calorie malnutrition is likely secondary to underlying malignancy. Encouraged po intake. - Nutrition consulted, appreciate recs  #Normocytic anemia #Thrombocytopenia Anemia likely secondary anemia of chronic disease, in the setting of hypoproliferation and widespread bone marrow suppression. No active signs of bleeding noted. Iron level WNL at 83 and ferritin markedly elevated at >3500, which could be secondary to prostate cancer. Seems mildly symptomatic today, will reassess for symptomatic anemia after biopsy. I anticipate his fatigue would improve with transfusion prior to discharge.  - Transfuse if Hb <7 - Monitor CBC  #Hypokalemia  K 2.7 on admission, improved to 3.3 this morning, although still low. - Kcl 40 mEq  #Bifascicular block Bifascicular block noted on EKG, with no known prior hx of cardiac disease. - Echo pending  Best Practice: Diet: NPO IVF: Fluids:  none VTE: Place and maintain sequential compression device Start: 07/01/21 0101 Code: Full AB: None Therapy Recs: Pending DISPO: Anticipated discharge in 1-3 days to Home pending Medical stability.  Signature: Buddy Duty, D.O.  Internal Medicine Resident, PGY-1 [facility]  Pager: [phone] 11:48 AM, 07/01/2021   Please contact the on call pager after 5 pm and on weekends at [phone].

## 2021-07-01 NOTE — Progress Notes (Signed)
°  Echocardiogram 2D Echocardiogram has been performed.  Patrick [name] 07/01/2021, 3:02 PM

## 2021-07-01 NOTE — Hospital Course (Addendum)
#  Probable prostate cancer with diffuse skeletal lytic lesions Patient initially presented with anemia, hypotension, and progressive weakness from the [facility]. He has had severe, unintentional weight loss, with elevated alk phos and ferritin, suspicious for underlying malignancy. Initial workup in the ED showed diffuse skeletal lesions consistent with new widespread malignancy. PSA >3000. Consulted IR for bone biopsy to get a definitive diagnosis. MRI brain ruled out CNS mets. Patient does have diminished strength in his RLE, but no evidence of bowel or bladder dysfunction. Patient also noted to be hyporeflexic, with negative Babinski in bilateral lower extremities. MRI lumber spine with pathologic compression fractures at T11, T12, L2, L3 and L4.  Also noted multifactorial severe spinal canal stenosis with cauda equina nerve root impingement at the L2-L3 disc level, L3 vertebral body level, L3-L4 disc level, L4 vertebral body level, L4-L5 disc level and L5 vertebral body level. Patient had successful CT-guided biopsy of L5 paraspinous mass with surgical pathology consistent with metastatic prostate cancer. Oncology was consulted with Lupron injections started on 1/12. Will also likely be started on bicalutamide and Zometa therapy as an outpatient. Recommended patient go to SNF for physical therapy with hopes of improving his functional status so that he can follow up with cancer center for palliative treatments. Medical oncology will coordinate with radiation oncology regarding palliative radiation to the lumbar spine.   #AKI, resolved Cr elevated at 1.5, with baseline around 1.0. AKI likely secondary to poor oral intake. AKI resolved with IVF on admission.  #Hypotension 2/2 volume depletion, resolved Patient initially presented with hypotension, which improved with IVF.    #Moderate Protein-calorie malnutrition Likely secondary to underlying malignancy. RD consulted and recommended Ensure Enlive po TID.    #Normocytic anemia #Thrombocytopenia Anemia likely secondary anemia of chronic disease, in the setting of hypoproliferation and widespread bone marrow suppression. No active signs of bleeding noted. Iron level WNL at 83 and ferritin markedly elevated at >3500, which could be secondary to prostate cancer. Likely secondary to hypoproliferation in the setting of widespread bony metastasis. Patient received 1u PRBC on 1/7 and feels improved. Hb 9.3 this morning and remained stable.   #Hypokalemia K 2.7 on admission, improved to 3.3 this morning, although still low. Repleted and K improved.   #Bifascicular block Bifascicular block noted on EKG, with no known prior hx of cardiac disease. Echo with mid-apical anterior hypokinesis, LVEF 60-65% and grade I diastolic dysfunction.

## 2021-07-01 NOTE — Progress Notes (Addendum)
Internal Medicine Clinic Attending  I saw and evaluated the patient.  I personally confirmed the key portions of the history and exam documented by Dr. Eulas Post and I reviewed pertinent patient test results.  The assessment, diagnosis, and plan were formulated together and I agree with the documentation in the residents note.   Complex patient coming in for follow-up of back pain.  Found to be hypotensive on presentation, orthostatic vital signs positive with lowest blood pressure 60/40s.  Patient was not symptomatic with positional change however required significant assistance to rise from the wheelchair and stand.  He was unable to stand for full 3 minutes.  No significant change in blood pressure with oral repletion, IV fluids were ordered. Suspected hypotension was due to poor p.o. intake and hypovolemia.   Patient was describing numbness and tingling in bilateral lower extremities with mild lower back pain.  Difficulty moving right leg at the hip, which was new since exam in clinic in November however patient was unable to tell us how long this has been present.  No incontinence or saddle anesthesia reported.  Chart review notable for 40 pound weight loss since November.  Patient did appear malnourished.  Patient was aware of the weight loss, and attributed this to poor appetite in the setting of Cymbalta use.  Colonoscopy completed in 2018 with two tubular adenomas.  Patient did endorse constipation, but denied bloody stools or other bowel symptoms.  SPEP ordered for myeloma work-up given neuropathy.  Question bony metastasis given lower extremity symptoms.  Patient does live alone by himself and notes it has been hard to take care of himself over the past few months, but did say he felt safe at home.     Original plan had been to replete with fluids, obtain labs for evaluation of lower extremity neuropathy as well as weight loss, provide with home DME equipment, engage social work in obtaining care  resources, and obtain urgent MRI lumbar spine to evaluate lower extremity weakness with consideration of CT chest abdomen and pelvis to evaluate for malignancy.  However given persistent hypotension and lab abnormalities of hyponatremia, AKI, new anemia, elevated ALP seen on stat labs obtained in clinic, patient was deemed appropriate for admission.  Considered direct admission however no beds available and patient did not have transportation to return to the hospital when a bed becomes available.  Patient brought to the emergency room by nursing staff with IV fluids running.  Blood pressure had mildly improved prior to transfer.

## 2021-07-01 NOTE — Procedures (Signed)
Vascular and Interventional Radiology Procedure Note  Patient: Patrick [name] DOB: 1951/04/30 Medical Record Number: [id number] Note Date/Time: 07/01/21 1:14 PM   Performing Physician: Michaelle Birks, MD Assistant(s): None  Diagnosis: Oligometastases. No Dx.  Procedure: L5 PARASPINOUS MASS BIOPSY  Anesthesia: Conscious Sedation Complications: None Estimated Blood Loss: Minimal Specimens: Sent for Pathology  Findings:  Successful CT-guided biopsy of L5 paraspinous mass. A total of 3 samples were obtained. Hemostasis of the tract was achieved using Manual Pressure.  Plan: Bed rest for 1 hours.  See detailed procedure note with images in PACS. The patient tolerated the procedure well without incident or complication and was returned to Floor Bed in stable condition.    Michaelle Birks, MD [facility]   Pager. [place]

## 2021-07-01 NOTE — H&P (Signed)
Chief Complaint: Concern for metastatic disease  Referring Physician(s): Orland Jarred   Supervising Physician: Jacqulynn Cadet  Patient Status: [facility] - In-pt  History of Present Illness: Patrick [name] is a 71 y.o. male who presented to the ED yesterday with symptomatic anemia.  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 Numerous pathologic fractures involving the ribs bilaterally as well as the T11, T12, L2, L3, and L4 vertebral bodies. Retropulsion of T11 and L3 results in severe central canal stenosis at L3. Multifactorial severe central canal stenosis also noted at L4, in part related to metastatic disease. Prominent soft tissue component involving the metastasis involving the spinous process of L5 may provide a a appropriate target for tissue sampling for further evaluation.    We are asked to perform a biopsy to evaluate for metastatic disease.  He is NPO.  Past Medical History:  Diagnosis Date   Allergy    seasonal   Arthritis    r knee   H/O inguinal hernia repair    History of tobacco use    HIV infection ([place])    Hyperlipidemia    Hypertension    Strabismic amblyopia     Past Surgical History:  Procedure Laterality Date   EYE SURGERY     strabismus   HERNIA REPAIR      Allergies: Patient has no known allergies.  Medications: Prior to Admission medications   Medication Sig Start Date End Date Taking? Authorizing Provider  atorvastatin (LIPITOR) 40 MG tablet TAKE 1 TABLET(40 MG) BY MOUTH DAILY Patient taking differently: Take 40 mg by mouth daily. 01/28/21  Yes Gaylan Gerold, DO  calcium citrate-vitamin D (CITRACAL+D) 315-200 MG-UNIT tablet Take 1 tablet by mouth daily.   Yes [provider]  Multiple Vitamins-Minerals (MULTIVITAMIN WITH MINERALS) tablet Take 1 tablet by mouth daily.   Yes [provider]  naproxen sodium (ALEVE) 220 MG tablet Take 220-440 mg by mouth See admin  instructions. 440 mg in the morning 220 mg in the afternoon and at bedtime   Yes [provider]  ODEFSEY 200-25-25 MG TABS tablet TAKE 1 TABLET BY MOUTH EVERY DAY Patient taking differently: 1 tablet every evening. 10/22/20  Yes Campbell Riches, MD     Family History  Problem Relation Age of Onset   Colon polyps Mother    Heart disease Mother    Heart disease Father    Kidney disease Father    Lung cancer Father    Diabetes Maternal Grandmother    Colon cancer Neg Hx     Social History   Socioeconomic History   Marital status: Single    Spouse name: Not on file   Number of children: Not on file   Years of education: Not on file   Highest education level: Not on file  Occupational History   Not on file  Tobacco Use   Smoking status: Former   Smokeless tobacco: Former    Types: Chew    Quit date: 1998  Vaping Use   Vaping Use: Never used  Substance and Sexual Activity   Alcohol use: Yes    Alcohol/week: 1.0 standard drink    Types: 1 Cans of beer per week    Comment: occ   Drug use: No   Sexual activity: Never    Comment: pt. declined condoms  Other Topics Concern   Not on file  Social History Narrative   Not on file   Social  Determinants of Health   Financial Resource Strain: Not on file  Food Insecurity: Not on file  Transportation Needs: Not on file  Physical Activity: Not on file  Stress: Not on file  Social Connections: Not on file     Review of Systems: A 12 point ROS discussed and pertinent positives are indicated in the HPI above.  All other systems are negative.  Review of Systems  Constitutional:  Positive for activity change and appetite change.   Vital Signs: BP 112/74    Pulse 87    Temp 97.8 F (36.6 C) (Oral)    Resp 20    SpO2 100%   Physical Exam Vitals reviewed.  Constitutional:      Appearance: Normal appearance.  HENT:     Head: Normocephalic and atraumatic.  Eyes:     Extraocular Movements: Extraocular movements  intact.  Cardiovascular:     Rate and Rhythm: Normal rate and regular rhythm.  Pulmonary:     Effort: Pulmonary effort is normal. No respiratory distress.     Breath sounds: Normal breath sounds.  Abdominal:     General: There is no distension.     Palpations: Abdomen is soft.     Tenderness: There is no abdominal tenderness.  Musculoskeletal:        General: Normal range of motion.     Cervical back: Normal range of motion.     Comments: Non-tender at spine  Skin:    General: Skin is warm and dry.  Neurological:     General: No focal deficit present.     Mental Status: He is alert and oriented to person, place, and time.  Psychiatric:        Mood and Affect: Mood normal.        Behavior: Behavior normal.        Thought Content: Thought content normal.        Judgment: Judgment normal.    Imaging: DG Lumbar Spine 2-3 Views  Result Date: 07/01/2021 CLINICAL DATA:  Low back pain and right leg weakness. EXAM: LUMBAR SPINE - 2-3 VIEW COMPARISON:  None. FINDINGS: There is diffusely decreased mineralization of the bones. There are compression deformities at T11, T12, and L4. T11 and T12 are not well assessed due to limitations of exam. There is loss of vertebral body height at L4 anteriorly of approximately 30%. Alignment is within normal limits. Mild multilevel intervertebral disc space narrowing, degenerative endplate changes and facet arthropathy is noted. There is atherosclerotic calcification and aneurysmal dilatation of the distal abdominal aorta measuring up to 3.1 cm in diameter. IMPRESSION: 1. New compression deformities at T11, T12, and L4, indeterminate in age. 2. Multilevel degenerative changes. 3. Aortic atherosclerosis with aneurysmal dilatation of the distal abdominal aorta measuring 3.1 cm. Electronically Signed   By: Brett Fairy M.D.   On: 07/01/2021 00:22   MR BRAIN WO CONTRAST  Result Date: 07/01/2021 CLINICAL DATA:  Metastatic disease evaluation. EXAM: MRI HEAD WITHOUT  CONTRAST TECHNIQUE: Multiplanar, multiecho pulse sequences of the brain and surrounding structures were obtained without intravenous contrast. COMPARISON:  None. FINDINGS: Brain: There is no evidence of an acute infarct, intracranial hemorrhage, midline shift, or extra-axial fluid collection. Small T2 hyperintensities in the cerebral white matter bilaterally are nonspecific but compatible with minimal chronic small vessel ischemic disease. Mild cerebral atrophy is within normal limits for age. No mass is identified, however absence of IV contrast limits sensitivity for detection of small lesions. Vascular: Major intracranial vascular flow voids are  preserved. Skull and upper cervical spine: Diffusely abnormal bone marrow signal throughout the skull and included upper cervical spine. Sinuses/Orbits: Unremarkable orbits. Trace bilateral mastoid fluid. Clear paranasal sinuses. Other: None. IMPRESSION: 1. No evidence of intracranial metastases on this noncontrast study. 2. Diffusely abnormal bone marrow signal consistent with known widespread osseous metastases. Electronically Signed   By: Logan Bores M.D.   On: 07/01/2021 10:02   CT CHEST ABDOMEN PELVIS W CONTRAST  Result Date: 07/01/2021 CLINICAL DATA:  Unintentional weight loss, elevated serum ferritin, fatigue EXAM: CT CHEST, ABDOMEN, AND PELVIS WITH CONTRAST TECHNIQUE: Multidetector CT imaging of the chest, abdomen and pelvis was performed following the standard protocol during bolus administration of intravenous contrast. CONTRAST:  11mL OMNIPAQUE IOHEXOL 300 MG/ML  SOLN COMPARISON:  None. FINDINGS: CT CHEST FINDINGS Cardiovascular: Mild coronary artery calcification. Global cardiac size within normal limits. No pericardial effusion. Central pulmonary arteries are of normal caliber. The thoracic aorta is dilated measuring 4.9 cm in its ascending segment and 3.2 cm in its proximal descending segment. Distally, the descending thoracic aorta is of normal  caliber. Minimal atherosclerotic calcification. Mediastinum/Nodes: No enlarged mediastinal, hilar, or axillary lymph nodes. Thyroid gland, trachea, and esophagus demonstrate no significant findings. Lungs/Pleura: Mild right basilar atelectasis. Lungs are otherwise clear. No pneumothorax or pleural effusion. Central airways are widely patent. Musculoskeletal: Pathologic fracture of the T11 vertebral body is seen with approximately 50% loss of height and retropulsion of the posterosuperior aspect of the vertebral body by approximately 3 mm. No significant resultant canal stenosis. The posterior elements appear intact. A a pathologic fracture of the inferior endplate of H85 is also identified with minimal loss of height. Sclerotic metastases are seen scattered throughout the thoracic spine, best noted within the T5 vertebral body. There are numerous mixed lytic and sclerotic metastases within the ribs bilaterally with pathologic fracture the left third, fourth and right seventh ribs. CT ABDOMEN PELVIS FINDINGS Hepatobiliary: No focal liver abnormality is seen. No gallstones, gallbladder wall thickening, or biliary dilatation. Pancreas: Unremarkable Spleen: Unremarkable Adrenals/Urinary Tract: The adrenal glands are unremarkable. The kidneys are normal in size and position. Tiny cortical hypodensity within the interpolar region of the left kidney is not well characterized on this examination but likely represents a tiny cortical cyst. Multiple nonobstructing calculi are seen within the right kidney measuring up to 3 mm. No ureteral calculi. No hydronephrosis. The bladder is unremarkable. Stomach/Bowel: Stomach is within normal limits. Appendix appears normal. No evidence of bowel wall thickening, distention, or inflammatory changes. Vascular/Lymphatic: Moderate aortoiliac atherosclerotic calcification. No aortic aneurysm. No pathologic adenopathy within the abdomen and pelvis. Reproductive: The prostate gland is  moderately enlarged. Other: No abdominal wall hernia.  Rectum unremarkable. Musculoskeletal: There are innumerable mixed lytic and sclerotic metastases noted throughout the lumbar spine and pelvis with pathologic fracture of the L2, L3, and L4 vertebral bodies. There is retropulsion of the posteroinferior aspect of the L3 vertebral body by approximately 8 mm which results in marked central canal stenosis. While many of the osseous metastases do not demonstrate significant soft tissue component, there is extensive involvement of the spinous process of L5 which demonstrates a notable soft tissue component bilaterally best seen on axial image # 86/3. There is severe multifactorial canal stenosis at L4-5 noted. 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 Numerous pathologic fractures involving the ribs bilaterally as well as the T11, T12, L2, L3, and L4 vertebral bodies. Retropulsion of T11 and L3 results in severe central canal stenosis at L3. Multifactorial severe  central canal stenosis also noted at L4, in part related to metastatic disease. Prominent soft tissue component involving the metastasis involving the spinous process of L5 may provide a a appropriate target for tissue sampling for further evaluation. Mild coronary artery calcification. Thoracic aortic aneurysm with maximal transaxial dimension of 4.9 cm. Ascending thoracic aortic aneurysm. Recommend semi-annual imaging followup by CTA or MRA and referral to cardiothoracic surgery if not already obtained. This recommendation follows 2010 ACCF/AHA/AATS/ACR/ASA/SCA/SCAI/SIR/STS/SVM Guidelines for the Diagnosis and Management of Patients With Thoracic Aortic Disease. Circulation. 2010; 121: X902-I097. Aortic aneurysm NOS (ICD10-I71.9) Moderate enlargement of the prostate gland. Correlation with serum PSA may be helpful. Electronically Signed   By: Fidela Salisbury M.D.   On: 07/01/2021 00:52    Labs:  CBC: Recent Labs     09/03/20 0849 06/30/21 1552 06/30/21 1830 07/01/21 0434  WBC 4.7 7.0 6.1 5.8  HGB 14.4 8.9* 7.9* 8.2*  HCT 43.9 27.0* 23.6* 24.8*  PLT 192 146* 148* 126*    COAGS: Recent Labs    07/01/21 0734  INR 1.2    BMP: Recent Labs    09/03/20 0849 06/30/21 1552 06/30/21 1830 07/01/21 0434  NA 140 124* 125* 130*  K 3.9 3.4* 2.7* 3.3*  CL 103 89* 92* 97*  CO2 32 25 23 21*  GLUCOSE 94 142* 114* 113*  BUN 15 33* 31* 20  CALCIUM 9.5 8.3* 7.8* 8.4*  CREATININE 0.79 1.51* 1.18 0.84  GFRNONAA  --  49* >60 >60    LIVER FUNCTION TESTS: Recent Labs    09/03/20 0849 06/30/21 1552 07/01/21 0434  BILITOT 0.9 1.2 0.6  AST 21 51* 45*  ALT 23 22 20   ALKPHOS  --  702* 593*  PROT 6.3 5.9* 5.1*  ALBUMIN  --  2.7* 2.4*    TUMOR MARKERS: No results for input(s): AFPTM, CEA, CA199, CHROMGRNA in the last 8760 hours.  Assessment and Plan:  Prominent soft tissue component involving the metastasis involving the spinous process of L5.  Images reviewed by Dr. Laurence Ferrari. Will proceed with image guided biopsy of L5 mass today.  Risks and benefits of L5 biopsy was discussed with the patient and/or patient's family including, but not limited to bleeding, infection, damage to adjacent structures or low yield requiring additional tests.  All of the questions were answered and there is agreement to proceed.  Consent signed and in chart.  Thank you for allowing our service to participate in HAROL SHABAZZ [name] 's care.  Electronically Signed: Murrell Redden, PA-C   07/01/2021, 10:55 AM      I spent a total of 40 Minutes in face to face in clinical consultation, greater than 50% of which was counseling/coordinating care for L5 biopsy.

## 2021-07-01 NOTE — ED Notes (Signed)
Pt transported to MRI 

## 2021-07-01 NOTE — ED Notes (Signed)
Pt to imaging

## 2021-07-02 DIAGNOSIS — C7951 Secondary malignant neoplasm of bone: Secondary | ICD-10-CM | POA: Diagnosis not present

## 2021-07-02 DIAGNOSIS — D649 Anemia, unspecified: Secondary | ICD-10-CM

## 2021-07-02 DIAGNOSIS — C61 Malignant neoplasm of prostate: Secondary | ICD-10-CM

## 2021-07-02 DIAGNOSIS — I959 Hypotension, unspecified: Secondary | ICD-10-CM | POA: Diagnosis not present

## 2021-07-02 LAB — BASIC METABOLIC PANEL
Anion gap: 8 (ref 5–15)
BUN: 12 mg/dL (ref 8–23)
CO2: 26 mmol/L (ref 22–32)
Calcium: 8.6 mg/dL — ABNORMAL LOW (ref 8.9–10.3)
Chloride: 101 mmol/L (ref 98–111)
Creatinine, Ser: 0.74 mg/dL (ref 0.61–1.24)
GFR, Estimated: >60 mL/min (ref >60)
Glucose, Bld: 107 mg/dL — ABNORMAL HIGH (ref 70–99)
Potassium: 4.3 mmol/L (ref 3.5–5.1)
Sodium: 135 mmol/L (ref 135–145)

## 2021-07-02 LAB — HEMOGLOBIN AND HEMATOCRIT, BLOOD
HCT: 27.9 % — ABNORMAL LOW (ref 39.0–52.0)
Hemoglobin: 9.5 g/dL — ABNORMAL LOW (ref 13.0–17.0)

## 2021-07-02 LAB — CBC
HCT: 24.5 % — ABNORMAL LOW (ref 39.0–52.0)
Hemoglobin: 8 g/dL — ABNORMAL LOW (ref 13.0–17.0)
MCH: 29.4 pg (ref 26.0–34.0)
MCHC: 32.7 g/dL (ref 30.0–36.0)
MCV: 90.1 fL (ref 80.0–100.0)
Platelets: 132 10*3/uL — ABNORMAL LOW (ref 150–400)
RBC: 2.72 MIL/uL — ABNORMAL LOW (ref 4.22–5.81)
RDW: 14.6 % (ref 11.5–15.5)
WBC: 5.1 10*3/uL (ref 4.0–10.5)
nRBC: 2.8 % — ABNORMAL HIGH (ref 0.0–0.2)

## 2021-07-02 LAB — MAGNESIUM: Magnesium: 1.8 mg/dL (ref 1.7–2.4)

## 2021-07-02 LAB — PREPARE RBC (CROSSMATCH)

## 2021-07-02 LAB — PATHOLOGIST SMEAR REVIEW

## 2021-07-02 MED ORDER — LIDOCAINE 5 % EX PTCH
1.0000 | MEDICATED_PATCH | CUTANEOUS | Status: DC
  Administered 2021-07-02 – 2021-07-06 (×4): 1 via TRANSDERMAL
  Filled 2021-07-02 (×5): qty 1

## 2021-07-02 MED ORDER — ENSURE ENLIVE PO LIQD
237.0000 mL | Freq: Three times a day (TID) | ORAL | Status: DC
  Administered 2021-07-02 – 2021-07-06 (×5): 237 mL via ORAL

## 2021-07-02 MED ORDER — SODIUM CHLORIDE 0.9% IV SOLUTION: Freq: Once | INTRAVENOUS | Status: AC

## 2021-07-02 MED ORDER — ACETAMINOPHEN 325 MG PO TABS
650.0000 mg | ORAL_TABLET | Freq: Four times a day (QID) | ORAL | Status: AC
  Administered 2021-07-02 – 2021-07-03 (×5): 650 mg via ORAL
  Administered 2021-07-04: 325 mg via ORAL
  Administered 2021-07-04 – 2021-07-05 (×3): 650 mg via ORAL
  Filled 2021-07-02 (×11): qty 2

## 2021-07-02 MED ORDER — ENSURE ENLIVE PO LIQD
237.0000 mL | Freq: Two times a day (BID) | ORAL | Status: DC
  Administered 2021-07-02: 237 mL via ORAL

## 2021-07-02 NOTE — Consult Note (Signed)
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  Imaging in the emergency department has shown diffuse skeletal lytic lesions consistent with a new widespread malignancy, PSA was >3000. He underwent CT-guided L5 paraspinal mass biopsy on July 01, 2021, the results still pending.  Patient has developed low back pain, along with bilateral feet numbness and tingling 3 months ago, his pain gets much worse when he walks for distance, and it has greatly limited what he can do.  He is retired, lives by himself, still able to do all ADLs.  He denies history of malignancy, no significant urinary difficulty.  MEDICAL HISTORY:  Past Medical History:  Diagnosis Date   Allergy    seasonal   Arthritis    r knee   H/O inguinal hernia repair    History of tobacco use    HIV infection ([place])    Hyperlipidemia    Hypertension    Strabismic amblyopia     SURGICAL HISTORY: Past Surgical History:  Procedure Laterality Date   EYE SURGERY     strabismus   HERNIA REPAIR      SOCIAL HISTORY: Social History   Socioeconomic History   Marital status: Single    Spouse name: Not on file   Number of children: Not on file   Years of education: Not on file   Highest education level: Not on file  Occupational History   Not on file  Tobacco Use   Smoking status: Former   Smokeless tobacco: Former    Types: Chew    Quit date: 1998  Vaping Use    Vaping Use: Never used  Substance and Sexual Activity   Alcohol use: Yes    Alcohol/week: 1.0 standard drink    Types: 1 Cans of beer per week    Comment: occ   Drug use: No   Sexual activity: Never    Comment: pt. declined condoms  Other Topics Concern   Not on file  Social History Narrative   Not on file   Social Determinants of Health   Financial Resource Strain: Not on file  Food Insecurity: Not on file  Transportation Needs: Not on file  Physical Activity: Not on file  Stress: Not on file  Social Connections: Not on file  Intimate Partner Violence: Not on file    FAMILY HISTORY: Family History  Problem Relation Age of Onset   Colon polyps Mother    Heart disease Mother    Heart disease Father    Kidney disease Father    Lung cancer Father    Diabetes Maternal Grandmother    Colon cancer Neg Hx     ALLERGIES:  has No Known Allergies.  MEDICATIONS:  Current Facility-Administered Medications  Medication Dose Route Frequency Provider Last Rate Last Admin   acetaminophen (TYLENOL) tablet 650 mg  650 mg Oral Q6H WA Sanjuan Dame, MD   650 mg at 07/02/21 1113   emtricitabine-rilpivir-tenofovir AF (ODEFSEY) 200-25-25 MG per tablet  1 tablet  1 tablet Oral QHS Lucrezia Starch, MD   1 tablet at 07/01/21 2316   feeding supplement (ENSURE ENLIVE / ENSURE PLUS) liquid 237 mL  237 mL Oral BID BM Axel Filler, MD   237 mL at 07/02/21 0915   HYDROcodone-acetaminophen (NORCO/VICODIN) 5-325 MG per tablet 1 tablet  1 tablet Oral Q6H PRN Jose Persia, MD   1 tablet at 07/02/21 0225   lidocaine (LIDODERM) 5 % 1 patch  1 patch Transdermal Q24H Sanjuan Dame, MD       multivitamin with minerals tablet 1 tablet  1 tablet Oral Daily Jose Persia, MD   1 tablet at 07/02/21 0915   polyethylene glycol (MIRALAX / GLYCOLAX) packet 17 g  17 g Oral Daily Jose Persia, MD   17 g at 07/02/21 0915    REVIEW OF SYSTEMS:   Constitutional: Denies fevers, chills or  abnormal night sweats Eyes: Denies blurriness of vision, double vision or watery eyes Ears, nose, mouth, throat, and face: Denies mucositis or sore throat Respiratory: Denies cough, dyspnea or wheezes Cardiovascular: Denies palpitation, chest discomfort or lower extremity swelling Gastrointestinal:  Denies nausea, heartburn or change in bowel habits Skin: Denies abnormal skin rashes Lymphatics: Denies new lymphadenopathy or easy bruising Neurological:Denies numbness, tingling or new weaknesses Behavioral/Psych: Mood is stable, no new changes  All other systems were reviewed with the patient and are negative.  PHYSICAL EXAMINATION: ECOG PERFORMANCE STATUS: 2 - Symptomatic, <50% confined to bed  Vitals:   07/02/21 1055 07/02/21 1115  BP: 104/71 106/69  Pulse: 95 95  Resp: 18 18  Temp: 98.8 F (37.1 C) 98.7 F (37.1 C)  SpO2: 97% 97%   There were no vitals filed for this visit.  GENERAL:alert, no distress and comfortable SKIN: skin color, texture, turgor are normal, no rashes or significant lesions EYES: normal, conjunctiva are pink and non-injected, sclera clear OROPHARYNX:no exudate, no erythema and lips, buccal mucosa, and tongue normal  NECK: supple, thyroid normal size, non-tender, without nodularity LYMPH:  no palpable lymphadenopathy in the cervical, axillary or inguinal LUNGS: clear to auscultation and percussion with normal breathing effort HEART: regular rate & rhythm and no murmurs and no lower extremity edema ABDOMEN:abdomen soft, non-tender and normal bowel sounds Musculoskeletal:no cyanosis of digits and no clubbing  PSYCH: alert & oriented x 3 with fluent speech NEURO: no focal motor/sensory deficits  LABORATORY DATA:  I have reviewed the data as listed Lab Results  Component Value Date   WBC 5.1 07/02/2021   HGB 8.0 (L) 07/02/2021   HCT 24.5 (L) 07/02/2021   MCV 90.1 07/02/2021   PLT 132 (L) 07/02/2021   Recent Labs    09/03/20 0849 09/03/20 0849  06/30/21 1552 06/30/21 1830 07/01/21 0434 07/02/21 0132  NA 140  --  124* 125* 130* 135  K 3.9  --  3.4* 2.7* 3.3* 4.3  CL 103  --  89* 92* 97* 101  CO2 32  --  25 23 21* 26  GLUCOSE 94  --  142* 114* 113* 107*  BUN 15  --  33* 31* 20 12  CREATININE 0.79   < > 1.51* 1.18 0.84 0.74  CALCIUM 9.5  --  8.3* 7.8* 8.4* 8.6*  GFRNONAA  --    < > 49* >60 >60 >60  PROT 6.3  --  5.9*  --  5.1*  --   ALBUMIN  --   --  2.7*  --  2.4*  --   AST 21  --  51*  --  45*  --   ALT 23  --  22  --  20  --   ALKPHOS  --   --  702*  --  593*  --   BILITOT 0.9  --  1.2  --  0.6  --    < > = values in this interval not displayed.    RADIOGRAPHIC STUDIES: I have personally reviewed the radiological images as listed and agreed with the findings in the report. DG Lumbar Spine 2-3 Views  Result Date: 07/01/2021 CLINICAL DATA:  Low back pain and right leg weakness. EXAM: LUMBAR SPINE - 2-3 VIEW COMPARISON:  None. FINDINGS: There is diffusely decreased mineralization of the bones. There are compression deformities at T11, T12, and L4. T11 and T12 are not well assessed due to limitations of exam. There is loss of vertebral body height at L4 anteriorly of approximately 30%. Alignment is within normal limits. Mild multilevel intervertebral disc space narrowing, degenerative endplate changes and facet arthropathy is noted. There is atherosclerotic calcification and aneurysmal dilatation of the distal abdominal aorta measuring up to 3.1 cm in diameter. IMPRESSION: 1. New compression deformities at T11, T12, and L4, indeterminate in age. 2. Multilevel degenerative changes. 3. Aortic atherosclerosis with aneurysmal dilatation of the distal abdominal aorta measuring 3.1 cm. Electronically Signed   By: Brett Fairy M.D.   On: 07/01/2021 00:22   MR BRAIN WO CONTRAST  Result Date: 07/01/2021 CLINICAL DATA:  Metastatic disease evaluation. EXAM: MRI HEAD WITHOUT CONTRAST TECHNIQUE: Multiplanar, multiecho pulse sequences of the  brain and surrounding structures were obtained without intravenous contrast. COMPARISON:  None. FINDINGS: Brain: There is no evidence of an acute infarct, intracranial hemorrhage, midline shift, or extra-axial fluid collection. Small T2 hyperintensities in the cerebral white matter bilaterally are nonspecific but compatible with minimal chronic small vessel ischemic disease. Mild cerebral atrophy is within normal limits for age. No mass is identified, however absence of IV contrast limits sensitivity for detection of small lesions. Vascular: Major intracranial vascular flow voids are preserved. Skull and upper cervical spine: Diffusely abnormal bone marrow signal throughout the skull and included upper cervical spine. Sinuses/Orbits: Unremarkable orbits. Trace bilateral mastoid fluid. Clear paranasal sinuses. Other: None. IMPRESSION: 1. No evidence of intracranial metastases on this noncontrast study. 2. Diffusely abnormal bone marrow signal consistent with known widespread osseous metastases. Electronically Signed   By: Logan Bores M.D.   On: 07/01/2021 10:02   MR Lumbar Spine Wo Contrast  Result Date: 07/01/2021 CLINICAL DATA:  Provided history: Neuropathy. Weakness of right hip. Lumbar radiculopathy, immunocompromised; acute weakness. Additional history provided: Low back pain for 4 months, pain radiates down both legs. EXAM: MRI LUMBAR SPINE WITHOUT CONTRAST TECHNIQUE: Multiplanar, multisequence MR imaging of the lumbar spine was performed. No intravenous contrast was administered. COMPARISON:  CT chest/abdomen/pelvis 07/01/2021. FINDINGS: Segmentation: 5 lumbar vertebrae. The caudal most well-formed intervertebral disc space is designated L5-S1. Alignment: Bony retropulsion at the T11, L3 and L4 levels. Most notably, bony retropulsion at the level of the L3 inferior endplate measures 5 mm. Vertebrae: There is extensive multifocal osseous signal abnormality compatible with diffuse osseous metastatic disease.  There is background diffuse abnormal T1 hypointense marrow signal, suggesting chronic anemia. There are multilevel presumed pathologic vertebral compression fractures as follows. T11 compression fracture (40% height loss). T12 compression fracture (less than 80% height loss). Superimposed Schmorl node within the T12 inferior endplate. L2 compression fracture (40-50% height loss posteriorly). L3 compression fracture (50% height loss). L4 superior endplate  compression fracture (30-40% height loss). Also of note, there is fairly extensive soft tissue tumor replacing the posterior elements at L5, most notably within the spinous process and bilateral articular pillars/laminae. This soft tissue tumor appears to partly encroach upon the spinal canal at this level. Conus medullaris and cauda equina: Conus extends to the L1 level. No signal abnormality within the visualized distal spinal cord. Paraspinal and other soft tissues: Small right renal cysts. Paraspinal soft tissues unremarkable. Disc levels: Mild disc degeneration within the lumbar or visualized lower thoracic spine. 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 T10-T11: Imaged sagittally. Facet arthrosis/ligamentum flavum hypertrophy. 4 mm bony retropulsion at the level of the T11 superior endplate. Resultant moderate spinal canal stenosis. There is contact upon the ventral aspect of the spinal cord with mild spinal cord flattening (series 3, image 9). Apparent mild bilateral neural foraminal narrowing. T11-T12: Imaged sagittally. Facet arthrosis/ligamentum flavum hypertrophy. No significant disc herniation or stenosis. T12-L1: Slight disc bulge. Minimal facet arthrosis. No significant spinal canal or foraminal stenosis. L1-L2: Small disc bulge. Mild facet arthrosis/ligamentum flavum hypertrophy. Mild bilateral subarticular and central canal narrowing (without appreciable nerve root impingement. Mild right Neural foraminal narrowing  L2-L3: Minimal bony retropulsion. Disc bulge. Moderate facet arthrosis with ligamentum flavum hypertrophy. Epidural lipomatosis. Severe spinal canal stenosis with impingement of the descending cauda equina nerve roots. Bilateral neural foraminal narrowing (moderate right, severe left). L3-L4: Bony retropulsion, greatest at the level of the L3 inferior endplate. Advanced facet arthrosis with ligamentum flavum hypertrophy. Epidural lipomatosis. Severe spinal canal stenosis with impingement of the descending cauda equina nerve roots at the L3 vertebral body level and L3-L4 disc level. Severe spinal canal stenosis is also present at the L4 vertebral body level with epidural lipomatosis contributing significantly. There is also severe spinal canal stenosis at the L4 vertebral body level with epidural lipomatosis contributing significantly at this level. Severe bilateral neural foraminal narrowing. L4-L5: Disc bulge. Advanced facet arthrosis with ligamentum flavum hypertrophy. Markedly severe spinal canal stenosis with impingement of the descending cauda equina nerve roots. Bilateral neural foraminal narrowing (moderate/severe right, severe left). L5-S1: Bony expansion, soft tissue tumor extension and epidural lipomatosis contribute to severe spinal canal stenosis with impingement of the descending cauda equina nerve roots at the L5 vertebral body level. At the L5-S1 disc level, there is a disc bulge with endplate spurring. Superimposed shallow broad-based center/left subarticular disc protrusion at site of posterior annular fissure. Mild facet arthrosis. Minimal ligamentum flavum hypertrophy. The disc protrusion contributes to mild left subarticular narrowing, contacting and with possible contact upon the descending left S1 nerve root. No significant foraminal stenosis. IMPRESSION: Findings compatible with diffuse osseous metastatic disease. There is fairly extensive soft tissue tumor replacement of the L5 posterior  elements, most notably involving the spinous process and bilateral articular pillars/laminae. Background diffuse abnormal T1 hypointense marrow signal suggesting chronic anemia. Presumed pathologic compression fractures at T11, T12, L2, L3 and L4. Multilevel bony retropulsion, greatest at the level of the L3 inferior endplate (measuring 5 mm at this site). Lumbar spondylosis, epidural lipomatosis and congenitally narrow lumbar spinal canal. Multilevel multifactorial spinal canal stenosis, as detailed. Most notably, there is multifactorial severe spinal canal stenosis with cauda equina nerve root impingement at the L2-L3 disc level, L3 vertebral body level, L3-L4 disc level, L4 vertebral body level, L4-L5 disc level and L5 vertebral body level. Notably, soft tissue tumor encroachment contributes to severe spinal canal stenosis at the L5 level. Also of note, bony retropulsion contributes  to moderate spinal canal stenosis at T10-T11. There is contact upon the ventral spinal cord (with mild spinal cord flattening) at this level. Multilevel foraminal stenosis, as detailed and greatest on the left at L2-L3 (severe), bilaterally at L3-L4 (severe) and bilaterally at L4-L5 (moderate/severe right, severe left). Electronically Signed   By: Kellie Simmering D.O.   On: 07/01/2021 16:35   CT CHEST ABDOMEN PELVIS W CONTRAST  Result Date: 07/01/2021 CLINICAL DATA:  Unintentional weight loss, elevated serum ferritin, fatigue EXAM: CT CHEST, ABDOMEN, AND PELVIS WITH CONTRAST TECHNIQUE: Multidetector CT imaging of the chest, abdomen and pelvis was performed following the standard protocol during bolus administration of intravenous contrast. CONTRAST:  161mL OMNIPAQUE IOHEXOL 300 MG/ML  SOLN COMPARISON:  None. FINDINGS: CT CHEST FINDINGS Cardiovascular: Mild coronary artery calcification. Global cardiac size within normal limits. No pericardial effusion. Central pulmonary arteries are of normal caliber. The thoracic aorta is dilated  measuring 4.9 cm in its ascending segment and 3.2 cm in its proximal descending segment. Distally, the descending thoracic aorta is of normal caliber. Minimal atherosclerotic calcification. Mediastinum/Nodes: No enlarged mediastinal, hilar, or axillary lymph nodes. Thyroid gland, trachea, and esophagus demonstrate no significant findings. Lungs/Pleura: Mild right basilar atelectasis. Lungs are otherwise clear. No pneumothorax or pleural effusion. Central airways are widely patent. Musculoskeletal: Pathologic fracture of the T11 vertebral body is seen with approximately 50% loss of height and retropulsion of the posterosuperior aspect of the vertebral body by approximately 3 mm. No significant resultant canal stenosis. The posterior elements appear intact. A a pathologic fracture of the inferior endplate of G40 is also identified with minimal loss of height. Sclerotic metastases are seen scattered throughout the thoracic spine, best noted within the T5 vertebral body. There are numerous mixed lytic and sclerotic metastases within the ribs bilaterally with pathologic fracture the left third, fourth and right seventh ribs. CT ABDOMEN PELVIS FINDINGS Hepatobiliary: No focal liver abnormality is seen. No gallstones, gallbladder wall thickening, or biliary dilatation. Pancreas: Unremarkable Spleen: Unremarkable Adrenals/Urinary Tract: The adrenal glands are unremarkable. The kidneys are normal in size and position. Tiny cortical hypodensity within the interpolar region of the left kidney is not well characterized on this examination but likely represents a tiny cortical cyst. Multiple nonobstructing calculi are seen within the right kidney measuring up to 3 mm. No ureteral calculi. No hydronephrosis. The bladder is unremarkable. Stomach/Bowel: Stomach is within normal limits. Appendix appears normal. No evidence of bowel wall thickening, distention, or inflammatory changes. Vascular/Lymphatic: Moderate aortoiliac  atherosclerotic calcification. No aortic aneurysm. No pathologic adenopathy within the abdomen and pelvis. Reproductive: The prostate gland is moderately enlarged. Other: No abdominal wall hernia.  Rectum unremarkable. Musculoskeletal: There are innumerable mixed lytic and sclerotic metastases noted throughout the lumbar spine and pelvis with pathologic fracture of the L2, L3, and L4 vertebral bodies. There is retropulsion of the posteroinferior aspect of the L3 vertebral body by approximately 8 mm which results in marked central canal stenosis. While many of the osseous metastases do not demonstrate significant soft tissue component, there is extensive involvement of the spinous process of L5 which demonstrates a notable soft tissue component bilaterally best seen on axial image # 86/3. There is severe multifactorial canal stenosis at L4-5 noted. 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 Numerous pathologic fractures involving the ribs bilaterally as well as the T11, T12, L2, L3, and L4 vertebral bodies. Retropulsion of T11 and L3 results in severe central canal stenosis at L3. Multifactorial severe central canal stenosis also noted at  L4, in part related to metastatic disease. Prominent soft tissue component involving the metastasis involving the spinous process of L5 may provide a a appropriate target for tissue sampling for further evaluation. Mild coronary artery calcification. Thoracic aortic aneurysm with maximal transaxial dimension of 4.9 cm. Ascending thoracic aortic aneurysm. Recommend semi-annual imaging followup by CTA or MRA and referral to cardiothoracic surgery if not already obtained. This recommendation follows 2010 ACCF/AHA/AATS/ACR/ASA/SCA/SCAI/SIR/STS/SVM Guidelines for the Diagnosis and Management of Patients With Thoracic Aortic Disease. Circulation. 2010; 121: J335-K562. Aortic aneurysm NOS (ICD10-I71.9) Moderate enlargement of the prostate gland. Correlation with  serum PSA may be helpful. Electronically Signed   By: Fidela Salisbury M.D.   On: 07/01/2021 00:52   CT BIOPSY  Result Date: 07/01/2021 INDICATION: Metastatic disease of unknown primary.  Lumbar paraspinous mass. EXAM: CT-GUIDED BIOPSY OF L5 PARASPINOUS MASS COMPARISON:  CT CAP, 07/01/2021.  MR L-spine, same day. MEDICATIONS: None. ANESTHESIA/SEDATION: Fentanyl 50 mcg IV; Versed 1 mg IV Sedation time: 27 minutes; The patient was continuously monitored during the procedure by the interventional radiology nurse under my direct supervision. CONTRAST:  None. COMPLICATIONS: None immediate. PROCEDURE: Informed consent was obtained from the patient following an explanation of the procedure, risks, benefits and alternatives. A time out was performed prior to the initiation of the procedure. The patient was positioned prone on the CT table and a limited CT was performed for procedural planning demonstrating L5 paraspinous mass. The procedure was planned. The operative site was prepped and draped in the usual sterile fashion. Appropriate trajectory was confirmed with a 22 gauge spinal needle after the adjacent tissues were anesthetized with 1% Lidocaine with epinephrine. Under intermittent CT guidance, a 17 gauge coaxial needle was advanced into the peripheral aspect of the mass. Appropriate positioning was confirmed and 3 samples were obtained with an 18 gauge core needle biopsy device. The co-axial needle was removed and hemostasis was achieved with manual compression. A limited postprocedural CT was negative for hemorrhage or additional complication. A dressing was placed. The patient tolerated the procedure well without immediate postprocedural complication. IMPRESSION: Successful CT-guided core needle biopsy of L5 paraspinous mass, as above. Michaelle Birks, MD [facility] Electronically Signed   By: Michaelle Birks M.D.   On: 07/01/2021 16:23   ECHOCARDIOGRAM  COMPLETE  Result Date: 07/01/2021    ECHOCARDIOGRAM REPORT   Patient Name:   Patrick [name] Date of Exam: 07/01/2021 Medical Rec #:  [id number]         Height:       70.0 in Accession #:    [id number]        Weight:       145.3 lb Date of Birth:  1950-11-10        BSA:          1.822 m Patient Age:    1 years          BP:           102/68 mmHg Patient Gender: M                 HR:           87 bpm. Exam Location:  Inpatient Procedure: 2D Echo Indications:    abnormal ecg  History:        Patient has no prior history of Echocardiogram examinations.                 HIV, Signs/Symptoms:back pain; Risk Factors:Hypertension,  Dyslipidemia and Former Smoker.  Sonographer:    Johny Chess RDCS Referring Phys: [id number] [place]  1. Mild mid-apical anterior hypokinesis. Left ventricular ejection fraction, by estimation, is 60 to 65%. The left ventricle has normal function. The left ventricle demonstrates regional wall motion abnormalities (see scoring diagram/findings for description). There is mild left ventricular hypertrophy of the septal segment. Left ventricular diastolic parameters are consistent with Grade I diastolic dysfunction (impaired relaxation).  2. Right ventricular systolic function is normal. The right ventricular size is normal. There is mildly elevated pulmonary artery systolic pressure.  3. Left atrial size was mildly dilated.  4. The mitral valve is normal in structure. Trivial mitral valve regurgitation. No evidence of mitral stenosis.  5. The aortic valve is tricuspid. Aortic valve regurgitation is severe. No aortic stenosis is present. Aortic regurgitation PHT measures 278 msec.  6. Aortic dilatation noted. 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  7. The inferior vena cava is normal in size with greater than 50% respiratory variability, suggesting right atrial pressure of 3 mmHg. FINDINGS  Left Ventricle: Mild mid-apical  anterior hypokinesis. Left ventricular ejection fraction, by estimation, is 60 to 65%. The left ventricle has normal function. The left ventricle demonstrates regional wall motion abnormalities. The left ventricular internal cavity size was normal in size. There is mild left ventricular hypertrophy of the septal segment. Left ventricular diastolic parameters are consistent with Grade I diastolic dysfunction (impaired relaxation). Right Ventricle: The right ventricular size is normal. No increase in right ventricular wall thickness. Right ventricular systolic function is normal. There is mildly elevated pulmonary artery systolic pressure. The tricuspid regurgitant velocity is 2.95  m/s, and with an assumed right atrial pressure of 3 mmHg, the estimated right ventricular systolic pressure is 99.2 mmHg. Left Atrium: Left atrial size was mildly dilated. Right Atrium: Right atrial size was normal in size. Pericardium: There is no evidence of pericardial effusion. Mitral Valve: The mitral valve is normal in structure. Trivial mitral valve regurgitation. No evidence of mitral valve stenosis. Tricuspid Valve: The tricuspid valve is normal in structure. Tricuspid valve regurgitation is trivial. No evidence of tricuspid stenosis. Aortic Valve: The aortic valve is tricuspid. Aortic valve regurgitation is severe. Aortic regurgitation PHT measures 278 msec. No aortic stenosis is present. Pulmonic Valve: The pulmonic valve was normal in structure. Pulmonic valve regurgitation is not visualized. No evidence of pulmonic stenosis. Aorta: Aortic dilatation noted. 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 Venous: The inferior vena cava is normal in size with greater than 50% respiratory variability, suggesting right atrial pressure of 3 mmHg. IAS/Shunts: No atrial level shunt detected by color flow Doppler.  LEFT VENTRICLE PLAX 2D LVIDd:         5.60 cm   Diastology LVIDs:         3.10 cm   LV e' medial:   7.43 cm/s LV PW:         1.00 cm   LV e' lateral: 11.10 cm/s LV IVS:        1.20 cm LVOT diam:     2.30 cm LV SV:         101 LV SV Index:   56 LVOT Area:     4.15 cm  RIGHT VENTRICLE RV S prime:     12.40 cm/s TAPSE (M-mode): 2.1 cm LEFT ATRIUM             Index  RIGHT ATRIUM           Index LA diam:        3.60 cm 1.98 cm/m   RA Area:     15.00 cm LA Vol (A2C):   59.8 ml 32.82 ml/m  RA Volume:   32.60 ml  17.89 ml/m LA Vol (A4C):   58.6 ml 32.16 ml/m LA Biplane Vol: 60.5 ml 33.20 ml/m  AORTIC VALVE LVOT Vmax:   153.00 cm/s LVOT Vmean:  95.300 cm/s LVOT VTI:    0.244 m AI PHT:      278 msec  AORTA Ao Root diam: 3.60 cm Ao Asc diam:  4.90 cm TRICUSPID VALVE TR Peak grad:   34.8 mmHg TR Vmax:        295.00 cm/s  SHUNTS Systemic VTI:  0.24 m Systemic Diam: 2.30 cm Skeet Latch MD Electronically signed by Skeet Latch MD Signature Date/Time: 07/01/2021/4:07:42 PM    Final     ASSESSMENT & PLAN:  69 male with HIV   Diffuse bone mets, likely metastatic prostate cancer Normocytic anemia, secondary to bone metastasis Mild thrombocytopenia, secondary to bone metastasis Weight loss and moderate malnutrition  Recommendations: -Patient has undergone L5 paraspinal mass biopsy yesterday, results still pending.  Based on the atypical image findings, and PSA>3000, this is consistent with metastatic prostate cancer. -I discussed the incurable nature of his malignancy, and treatment options. -When the biopsy confirmed metastatic prostate cancer, I will start him on Lupron injection early next week and will continue every 3 months.  He has no other visceral metastasis, I recommend abiraterone 1000mg  daily and prednisone 5mg  as his first line treatment in addition to Lupron.  -I also recommend Zometa 4 mg infusion once next week, for his diffuse bone metastasis, to reduce his future risk of fractures. -Patient has bilateral lower extremity paresthesia and weakness (more on R), I will review his MRI  with radiation oncologist, to see if he benefit from a course of palliative radiation to lumbar spine.  -I will f/u early next week when biopsy result is back.   All questions were answered. The patient knows to call the clinic with any problems, questions or concerns.      Truitt Merle, MD 07/02/2021 12:26 PM

## 2021-07-02 NOTE — Progress Notes (Signed)
Physical Therapy Evaluation Patient Details Name: Patrick [name] MRN: [id number] DOB: Apr 19, 1951 Today's Date: 07/02/2021  History of Present Illness  71 year old person living with HIV sent to the emergency department 1/5 from the internal medicine clinic for hypotension, progressive anemia, and progressive weakness.  Imaging in the emergency department has shown diffuse skeletal lytic lesions consistent with a new widespread malignancy.  Presumed pathologic compression fractures at T11, T12, L2, L3 and  L4. Multilevel bony retropulsion, greatest at the level of the L3  inferior endplate (measuring 5 mm at this site).  Biopsy on L5 mass completed 1/6.  PMHx:  HIV, anemia, malnutrition, spinal pathological fractures,  Clinical Impression  Pt was seen for progression of mobility today and to observe and test his awareness of safety with his spine.  Pt is not aware of the limitations for movement, and verbally reviewed them with him.  Recommended to MD that a brace be obtained to help pt stabilize his back and protect the pathological fractures, and an order has been placed.  Follow acutely to make pt more aware of spinal protection, to increase LE strength and to work on postural correction along with safety of gait.  Brace will come from outside vendor and will integrate into therapy teaching when it arrives.       Recommendations for follow up therapy are one component of a multi-disciplinary discharge planning process, led by the attending physician.  Recommendations may be updated based on patient status, additional functional criteria and insurance authorization.  Follow Up Recommendations Skilled nursing-short term rehab (<3 hours/day)    Assistance Recommended at Discharge Frequent or constant Supervision/Assistance  Patient can return home with the following  A lot of help with walking and/or transfers;A lot of help with bathing/dressing/bathroom;Assistance with cooking/housework;Assist  for transportation;Help with stairs or ramp for entrance    Equipment Recommendations Rolling walker (2 wheels)  Recommendations for Other Services       Functional Status Assessment Patient has had a recent decline in their functional status and demonstrates the ability to make significant improvements in function in a reasonable and predictable amount of time.     Precautions / Restrictions Precautions Precautions: Fall Precaution Comments: spinal precautions Required Braces or Orthoses: Other Brace (orderng TLSO to protect spine) Restrictions Weight Bearing Restrictions: No Other Position/Activity Restrictions: spinal precautions      Mobility  Bed Mobility Overal bed mobility: Needs Assistance Bed Mobility: Sidelying to Sit;Sit to Sidelying;Rolling Rolling: Min assist Sidelying to sit: Min assist     Sit to sidelying: Min assist General bed mobility comments: instructed pt on back precautions but will need to get him a hand out potentially    Transfers Overall transfer level: Needs assistance Equipment used: Rolling walker (2 wheels) Transfers: Sit to/from Stand Sit to Stand: Mod assist           General transfer comment: mod assist with pt noting fatigue from being up earlier    Ambulation/Gait Ambulation/Gait assistance: Min assist Gait Distance (Feet): 36 Feet Assistive device: Rolling walker (2 wheels);1 person hand held assist Gait Pattern/deviations: Step-through pattern;Decreased stride length;Shuffle;Narrow base of support;Trunk flexed Gait velocity: reduced Gait velocity interpretation: <1.8 ft/sec, indicate of risk for recurrent falls Pre-gait activities: standing balance and posture check, asymptomatic for orthostasis but needs to be checked General Gait Details: pt was tired and required extra help to get to bathroom and back.  Pt was asymptomatic to get up and did not have time to test BP due to urgency  of getting to [employer]    Modified Rankin (Stroke Patients Only)       Balance Overall balance assessment: Needs assistance Sitting-balance support: Feet supported Sitting balance-Leahy Scale: Fair   Postural control: Posterior lean;Left lateral lean Standing balance support: Bilateral upper extremity supported;During functional activity;Reliant on assistive device for balance Standing balance-Leahy Scale: Poor Standing balance comment: mild bucklng appearance of knees due to weakness                             Pertinent Vitals/Pain Pain Assessment: No/denies pain    Home Living Family/patient expects to be discharged to:: Private residence Living Arrangements: Alone Available Help at Discharge: Friend(s);Available PRN/intermittently Type of Home: House Home Access: Stairs to enter   [employer] of Steps: 1   Home Layout: One level Home Equipment: None      Prior Function Prior Level of Function : Independent/Modified Independent             Mobility Comments: not requiring an AD previously       Hand Dominance   Dominant Hand: Right    Extremity/Trunk Assessment   Upper Extremity Assessment Upper Extremity Assessment: Generalized weakness    Lower Extremity Assessment Lower Extremity Assessment: Generalized weakness    Cervical / Trunk Assessment Cervical / Trunk Assessment: Other exceptions (chronic spinal pathological fractures)  Communication   Communication: No difficulties  Cognition Arousal/Alertness: Lethargic Behavior During Therapy: Flat affect Overall Cognitive Status: Impaired/Different from baseline Area of Impairment: Problem solving;Awareness;Safety/judgement;Following commands;Memory;Attention;Orientation                 Orientation Level: Situation Current Attention Level: Selective Memory: Decreased short-term memory Following Commands: Follows one step commands with increased  time Safety/Judgement: Decreased awareness of safety;Decreased awareness of deficits Awareness: Intellectual Problem Solving: Slow processing;Requires verbal cues;Requires tactile cues General Comments: pt is quite unaware of safety, limited insight to risks of impulsive movement        General Comments General comments (skin integrity, edema, etc.): Pt was seen for standing, walking and balancing, but is overall weak and unsteady on his feet.  Will need to guard him with a chair for any distances with gait    Exercises     Assessment/Plan    PT Assessment Patient needs continued PT services  PT Problem List Decreased strength;Decreased activity tolerance;Decreased balance;Decreased coordination;Decreased knowledge of use of DME;Decreased safety awareness;Decreased knowledge of precautions       PT Treatment Interventions DME instruction;Gait training;Functional mobility training;Therapeutic activities;Therapeutic exercise;Balance training;Neuromuscular re-education;Patient/family education    PT Goals (Current goals can be found in the Care Plan section)  Acute Rehab PT Goals Patient Stated Goal: to get his strength back PT Goal Formulation: With patient Time For Goal Achievement: 07/16/21 Potential to Achieve Goals: Good    Frequency Min 3X/week     Co-evaluation               AM-PAC PT "6 Clicks" Mobility  Outcome Measure Help needed turning from your back to your side while in a flat bed without using bedrails?: A Little Help needed moving from lying on your back to sitting on the side of a flat bed without using bedrails?: A Little Help needed moving to and from a bed to a chair (including a wheelchair)?: A Lot Help needed standing up from a chair using your arms (  e.g., wheelchair or bedside chair)?: A Lot Help needed to walk in hospital room?: A Lot Help needed climbing 3-5 steps with a railing? : Total 6 Click Score: 13    End of Session Equipment Utilized  During Treatment: Gait belt Activity Tolerance: Patient limited by fatigue;Treatment limited secondary to medical complications (Comment) Patient left: in bed;with call bell/phone within reach;with bed alarm set Nurse Communication: Mobility status PT Visit Diagnosis: Unsteadiness on feet (R26.81);Difficulty in walking, not elsewhere classified (R26.2);Muscle weakness (generalized) (M62.81)    Time: 2595-6387 PT Time Calculation (min) (ACUTE ONLY): 21 min   Charges:   PT Evaluation $PT Eval Moderate Complexity: 1 Mod         Ramond Dial 07/02/2021, 5:22 PM  Mee Hives, PT PhD Acute Rehab Dept. Number: [place] [facility] [place]

## 2021-07-02 NOTE — Progress Notes (Signed)
Pt arrived to the unit in NAD. Bed in lowest position. Call bell within reach.

## 2021-07-02 NOTE — Progress Notes (Signed)
Initial Nutrition Assessment  DOCUMENTATION CODES:   Not applicable  INTERVENTION:  Provide Ensure Enlive po TID, each supplement provides 350 kcal and 20 grams of protein  Encourage adequate PO intake.   NUTRITION DIAGNOSIS:   Increased nutrient needs related to chronic illness (HIV) as evidenced by estimated needs.  GOAL:   Patient will meet greater than or equal to 90% of their needs  MONITOR:   PO intake, Supplement acceptance, Skin, Weight trends, Labs, I & O's  REASON FOR ASSESSMENT:   Consult Assessment of nutrition requirement/status, Poor PO  ASSESSMENT:   71 y.o. with a pertinent PMH of HIV, HTN, and chronic lumbar DDD, who presented with anemia and hypotension and admitted for further workup and evaluation of probable prostate cancer with diffuse skeletal lytic lesions, consistent with new widespread malignancy. IR biopsy of L5 paraspinal tissue on 1/6.  Pt unavailable during attempted time of contact. RD unable to obtain pt nutrition history at this time. Pt with a 22% weight loss in 2 months per weight record, significant for time frame. Pt currently has Ensure ordered and has been consuming them. RD to increase Ensure to TID to aid in caloric and protein needs.  Unable to complete Nutrition-Focused physical exam at this time.   Labs and medications reviewed.   Diet Order:   Diet Order             Diet regular Room service appropriate? Yes; Fluid consistency: Thin  Diet effective now                   EDUCATION NEEDS:   Not appropriate for education at this time  Skin:  Skin Assessment: Reviewed RN Assessment  Last BM:  12/30  Height:   Ht Readings from Last 1 Encounters:  06/30/21 5\' 10"  (1.778 m)    Weight:   Wt Readings from Last 1 Encounters:  06/30/21 65.9 kg   BMI:  There is no height or weight on file to calculate BMI.  Estimated Nutritional Needs:   Kcal:  1900-2100  Protein:  95-105 grams  Fluid:  >/= 1.9  L/day  Corrin Parker, MS, RD, LDN RD pager number/after hours weekend pager number on Am[employer].

## 2021-07-02 NOTE — Progress Notes (Signed)
HD#2 SUBJECTIVE:  Patient Summary: Patrick [name] is a 71 y.o. with a pertinent PMH of HIV, HTN, and chronic lumbar DDD, who presented with anemia and hypotension and admitted for further workup and evaluation of probable prostate cancer with diffuse skeletal lytic lesions.  Overnight Events: No acute events overnight  Interim History: This is hospital day 2 for Patrick [name] who was seen and evaluated at the bedside this morning. He states that his back pain is tolerable and that he slept well last night. His appetite is good and he ate his breakfast. He is keeping his spirits up.   OBJECTIVE:  Vital Signs: Vitals:   07/01/21 2300 07/02/21 0114 07/02/21 0447 07/02/21 0848  BP: 117/87 116/81 104/69 131/84  Pulse: (!) 108 (!) 108 (!) 105 98  Resp: (!) 24 18 17 18   Temp:  99.1 F (37.3 C) 98.6 F (37 C) 97.9 F (36.6 C)  TempSrc:  Oral Axillary Oral  SpO2: 94% 99% 94% 97%   Supplemental O2: Room Air SpO2: 97 %  There were no vitals filed for this visit.   Intake/Output Summary (Last 24 hours) at 07/02/2021 1003 Last data filed at 07/02/2021 0447 Gross per 24 hour  Intake --  Output 1200 ml  Net -1200 ml   Net IO Since Admission: -1,200 mL [07/02/21 1003]  Physical Exam: General: Pleasant, elderly male laying in bed. No acute distress. CV: RRR. No murmurs, rubs, or gallops. No LE edema Pulmonary: Lungs CTAB. Normal effort.  Extremities: Palpable radial and DP pulses.  Skin: Warm and dry.  Neuro: A&Ox3. 1/5 strength RLE (unable to list leg off table, although plantar and dorsiflexion 5/5). 5/5 strength LUE, RUE, and LLE.  Psych: Normal mood and affect     ASSESSMENT/PLAN:  Assessment: Principal Problem:   Hypotension Active Problems:   Human immunodeficiency virus (HIV) disease (HCC)   Anemia of chronic disease   AKI (acute kidney injury) ([place])   Moderate protein-calorie malnutrition (HCC)   Pathological fracture due to metastatic bone  disease   Plan: #Probable prostate cancer with diffuse skeletal lytic lesions Patient initially presented with anemia, hypotension, and progressive weakness from the [facility]. Initial workup in the ED showed diffuse skeletal lesions consistent with new widespread malignancy. PSA >3000, which makes a primary prostate cancer the most likely diagnosis at this point. Had IR biopsy of L5 paraspinal tissue on 1/6. MRI brain ruled out CNS mets and MRI lumbar spine showed pathologic compression fractures at T11, T12, L2, L3 and L4.  Also noted multifactorial severe spinal canal stenosis with cauda equina nerve root impingement at the L2-L3 disc level, L3 vertebral body level, L3-L4 disc level, L4 vertebral body level, L4-L5 disc level and L5 vertebral body level. - PT/OT recommending SNF - Oncology consulted, appreciate recs (likely will evaluate this patient sometime this weekend, Monday at the latest) - Follow up biopsy results  - Lidocaine patch prn  - Norco 5-325 mg q6h prn   #Hypotension 2/2 volume depletion, resolved #Protein calorie malnutrition, moderate Protein calorie malnutrition is likely secondary to underlying malignancy. Encouraged po intake. - Nutrition consulted, appreciate recs  #Normocytic anemia #Thrombocytopenia Anemia likely secondary anemia of chronic disease, in the setting of hypoproliferation and widespread bone marrow suppression. No active signs of bleeding noted. Remains symptomatic today with fatigue. I anticipate his fatigue would improve with transfusion. - 1u PRBC today - Daily CBC  #Hypokalemia, improved K improved from 2.7 on admission to 4.3 today. - Daily BMP  #  Bifascicular block Bifascicular block noted on EKG, with no known prior hx of cardiac disease. Echo with mid-apical anterior hypokinesis, LVEF 60-65% and grade I diastolic dysfunction.   Best Practice: Diet: Regular diet IVF: Fluids: none VTE: Place and maintain sequential compression device Start:  07/01/21 0101 Code: Full AB: none Therapy Recs: SNF DISPO: Anticipated discharge to Rehab pending medical work up completion and placement .  Signature: Buddy Duty, D.O.  Internal Medicine Resident, PGY-1 [facility]  Pager: [phone] 10:03 AM, 07/02/2021   Please contact the on call pager after 5 pm and on weekends at [phone].

## 2021-07-02 NOTE — Evaluation (Signed)
Occupational Therapy Evaluation Patient Details Name: Patrick [name] MRN: [id number] DOB: 07-21-1950 Today's Date: 07/02/2021   History of Present Illness 71 year old person living with HIV sent to the emergency department from the internal medicine clinic for hypotension, progressive anemia, and progressive weakness.  Imaging in the emergency department has shown diffuse skeletal lytic lesions consistent with a new widespread malignancy.  Biopsy completed 1/6.   Clinical Impression   Patient admitted for the above diagnosis.  PTA he lives alone, and has PRN assist from friends and a neighbor for community mobility.  He typically orders groceries and has them delivered, and admits to increasing difficulties with home management.  Deficits impacting independence are listed below.  Currently he is needing up to [place] and increased time for simple transfers, and up to Mod A for lower body ADL.  OT to follow in the acute setting, but post acute rehab will be needed prior to returning home.  Patient does not have sufficient assist at home to transition directly without 24 hour Moderate assist.        Recommendations for follow up therapy are one component of a multi-disciplinary discharge planning process, led by the attending physician.  Recommendations may be updated based on patient status, additional functional criteria and insurance authorization.   Follow Up Recommendations  Skilled nursing-short term rehab (<3 hours/day)    Assistance Recommended at Discharge    Patient can return home with the following A lot of help with walking and/or transfers;A lot of help with bathing/dressing/bathroom;Assist for transportation;Assistance with cooking/housework    Functional Status Assessment  Patient has had a recent decline in their functional status and demonstrates the ability to make significant improvements in function in a reasonable and predictable amount of time.  Equipment  Recommendations  BSC/3in1;Tub/shower bench    Recommendations for Other Services       Precautions / Restrictions Precautions Precautions: Fall Restrictions Weight Bearing Restrictions: No      Mobility Bed Mobility Overal bed mobility: Needs Assistance Bed Mobility: Supine to Sit     Supine to sit: Supervision          Transfers Overall transfer level: Needs assistance Equipment used: Rolling walker (2 wheels) Transfers: Sit to/from Stand;Bed to chair/wheelchair/BSC Sit to Stand: Min guard;From elevated surface     Step pivot transfers: Min assist            Balance Overall balance assessment: Needs assistance Sitting-balance support: Feet supported Sitting balance-Leahy Scale: Fair   Postural control: Posterior lean Standing balance support: Reliant on assistive device for balance Standing balance-Leahy Scale: Poor                             ADL either performed or assessed with clinical judgement   ADL Overall ADL's : Needs assistance/impaired Eating/Feeding: Set up;Sitting   Grooming: Wash/dry hands;Wash/dry face;Set up;Sitting   Upper Body Bathing: Min guard;Sitting   Lower Body Bathing: Moderate assistance;Sit to/from stand   Upper Body Dressing : Min guard;Sitting   Lower Body Dressing: Moderate assistance;Sit to/from stand               Functional mobility during ADLs: Minimal assistance;Rolling walker (2 wheels);Cueing for sequencing       Vision Baseline Vision/History: 1 Wears glasses Patient Visual Report: No change from baseline       Perception Perception Perception: Not tested   Praxis Praxis Praxis: Not tested    Pertinent Vitals/Pain  Pain Assessment: No/denies pain     Hand Dominance Right   Extremity/Trunk Assessment Upper Extremity Assessment Upper Extremity Assessment: Generalized weakness   Lower Extremity Assessment Lower Extremity Assessment: Generalized weakness   Cervical / Trunk  Assessment Cervical / Trunk Assessment: Normal   Communication Communication Communication: No difficulties   Cognition Arousal/Alertness: Lethargic Behavior During Therapy: Flat affect Overall Cognitive Status: Impaired/Different from baseline Area of Impairment: Memory;Following commands;Awareness                     Memory: Decreased short-term memory Following Commands: Follows one step commands with increased time   Awareness: Emergent   General Comments: Slow mentation and requires frequent cues.  Further cognitive testing may be needed.     General Comments   VSS on RA    Exercises     Shoulder Instructions      Home Living Family/patient expects to be discharged to:: Private residence Living Arrangements: Alone Available Help at Discharge: Friend(s);Available PRN/intermittently Type of Home: House Home Access: Stairs to enter [employer] of Steps: 1   Home Layout: One level     Bathroom Shower/Tub: Teacher, early years/pre: Standard     Home Equipment: None          Prior Functioning/Environment Prior Level of Function : Independent/Modified Independent             Mobility Comments: Currenlty not using a RW at home.  Does not drive. ADLs Comments: Increaseing difficulty with home management.  Able to complete self care and light meal prep.  has groceries delivered.        OT Problem List: Decreased strength;Decreased activity tolerance;Impaired balance (sitting and/or standing);Decreased cognition;Decreased safety awareness;Decreased knowledge of use of DME or AE      OT Treatment/Interventions: Self-care/ADL training;Therapeutic exercise;Balance training;Therapeutic activities;DME and/or AE instruction;Patient/family education    OT Goals(Current goals can be found in the care plan section) Acute Rehab OT Goals Patient Stated Goal: Unsure at this point OT Goal Formulation: With patient Time For Goal  Achievement: 07/16/21 Potential to Achieve Goals: Good ADL Goals Pt Will Perform Grooming: with supervision;standing Pt Will Perform Lower Body Bathing: with supervision;sit to/from stand Pt Will Perform Lower Body Dressing: with supervision;sit to/from stand Pt Will Transfer to Toilet: with set-up;regular height toilet;ambulating Pt Will Perform Toileting - Clothing Manipulation and hygiene: with supervision;sit to/from stand Pt/caregiver will Perform Home Exercise Program: Increased strength;Both right and left upper extremity;With written HEP provided;With theraband  OT Frequency: Min 2X/week    Co-evaluation              AM-PAC OT "6 Clicks" Daily Activity     Outcome Measure Help from another person eating meals?: None Help from another person taking care of personal grooming?: None Help from another person toileting, which includes using toliet, bedpan, or urinal?: A Little Help from another person bathing (including washing, rinsing, drying)?: A Lot Help from another person to put on and taking off regular upper body clothing?: A Little Help from another person to put on and taking off regular lower body clothing?: A Lot 6 Click Score: 18   End of Session Equipment Utilized During Treatment: Gait belt;Rolling walker (2 wheels) Nurse Communication: Mobility status  Activity Tolerance: Patient limited by lethargy Patient left: in chair;with call bell/phone within reach;with chair alarm set  OT Visit Diagnosis: Unsteadiness on feet (R26.81);Muscle weakness (generalized) (M62.81);Other symptoms and signs involving cognitive function  Time: 5520-8022 OT Time Calculation (min): 18 min Charges:  OT General Charges $OT Visit: 1 Visit OT Evaluation $OT Eval Moderate Complexity: 1 Mod  07/02/2021  RP, OTR/L  Acute Rehabilitation Services  Office:  [place] 07/02/2021, 9:30 AM

## 2021-07-03 LAB — CBC
HCT: 28.9 % — ABNORMAL LOW (ref 39.0–52.0)
Hemoglobin: 9.3 g/dL — ABNORMAL LOW (ref 13.0–17.0)
MCH: 29.1 pg (ref 26.0–34.0)
MCHC: 32.2 g/dL (ref 30.0–36.0)
MCV: 90.3 fL (ref 80.0–100.0)
Platelets: 120 K/uL — ABNORMAL LOW (ref 150–400)
RBC: 3.2 MIL/uL — ABNORMAL LOW (ref 4.22–5.81)
RDW: 14.9 % (ref 11.5–15.5)
WBC: 5.7 K/uL (ref 4.0–10.5)
nRBC: 5.3 % — ABNORMAL HIGH (ref 0.0–0.2)

## 2021-07-03 LAB — BPAM RBC
Blood Product Expiration Date: 202301202359
ISSUE DATE / TIME: 202301071048
Unit Type and Rh: 600

## 2021-07-03 LAB — TYPE AND SCREEN
ABO/RH(D): A NEG
Antibody Screen: NEGATIVE
Unit division: 0

## 2021-07-03 MED ORDER — EMTRICITAB-RILPIVIR-TENOFOV AF 200-25-25 MG PO TABS
1.0000 | ORAL_TABLET | Freq: Every day | ORAL | Status: DC
  Administered 2021-07-04 – 2021-07-05 (×2): 1 via ORAL
  Filled 2021-07-03 (×3): qty 1

## 2021-07-03 NOTE — Progress Notes (Signed)
Orthopedic Tech Progress Note Patient Details:  Patrick [name] 1950/11/28 [id number]  Patient ID: [name], male   DOB: 1951-02-20, 71 y.o.   MRN: [id number] I called order into hanger Karolee Stamps 07/03/2021, 3:58 AM

## 2021-07-03 NOTE — Progress Notes (Addendum)
NAME:  Patrick [name], MRN:  [id number], DOB:  1950/08/26, LOS: 2 ADMISSION DATE:  06/30/2021  Subjective  Patient evaluated at bedside this AM. Doing well this morning, no acute events overnight. Continuing to keep his spirits up despite likely cancer diagnosis.  Objective   Blood pressure 111/79, pulse 96, temperature 99 F (37.2 C), temperature source Oral, resp. rate 18, height 5\' 10"  (1.778 m), weight 65.9 kg, SpO2 97 %.     Intake/Output Summary (Last 24 hours) at 07/03/2021 1818 Last data filed at 07/03/2021 1330 Gross per 24 hour  Intake 640 ml  Output 1150 ml  Net -510 ml   Filed Weights   07/03/21 0352  Weight: 65.9 kg   Physical Exam: General: Resting comfortably in no acute distress CV: Regular rate, rhythm. No murmurs appreciated Pulm: Normal work of breathing on room air Neuro: Awake, alert, conversing appropriately.  Labs    CBC Latest Ref Rng & Units 07/03/2021 07/02/2021 07/02/2021  WBC 4.0 - 10.5 K/uL 5.7 - 5.1  Hemoglobin 13.0 - 17.0 g/dL 9.3(L) 9.5(L) 8.0(L)  Hematocrit 39.0 - 52.0 % 28.9(L) 27.9(L) 24.5(L)  Platelets 150 - 400 K/uL 120(L) - 132(L)   BMP Latest Ref Rng & Units 07/02/2021 07/01/2021 06/30/2021  Glucose 70 - 99 mg/dL 107(H) 113(H) 114(H)  BUN 8 - 23 mg/dL 12 20 31(H)  Creatinine 0.61 - 1.24 mg/dL 0.74 0.84 1.18  BUN/Creat Ratio 6 - 22 (calc) - - -  Sodium 135 - 145 mmol/L 135 130(L) 125(L)  Potassium 3.5 - 5.1 mmol/L 4.3 3.3(L) 2.7(LL)  Chloride 98 - 111 mmol/L 101 97(L) 92(L)  CO2 22 - 32 mmol/L 26 21(L) 23  Calcium 8.9 - 10.3 mg/dL 8.6(L) 8.4(L) 7.8(L)   Summary  Patrick [name] is 71yo with HIV, hypertension, chronic lumbar degenerative disease admitted 1/6 with hypotension and found to have diffuse osseous metastatic disease, likely primary prostate.  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 Plan to initiate Lupron injections this next week, will further decide on palliative radiation at a later time. Currently patient is comfortable with pain well-controlled.  - F/u surgical biopsy - Lidocaine patch as needed - Norco 5-325mg  every 6 hours as needed - Appreciate oncology's assistance - PT/OT, pending SNF placement  #Moderate protein-calorie malnutrition Likely due to underlying malignancy, appreciate RD's assistance.  #Normocytic anemia #Thrombocytopenia Received unit of blood yesterday, symptoms improved today. Most likely anemia 2/2 hypoproliferation with wide spread metastasis. Will continue to monitor.  - Daily CBC  #Bifascicular block Newly noted on ECG, no prior cardiac history. Echo revealed regional wall abnormalities w/ mid-apical anterior hypokinesis, normal EF. Given new bifascicular block w/ regional wall abnormalities, likely had an ischemic event at one point. However, given his diffuse metastatic disease and overall prognosis, unsure if pursuing a cardiology consult with work-up would be beneficial. Will discuss with patient at bedside.  Best practice:  DIET: Regular IVF: n/a DVT PPX: SCD BOWEL: Miralax CODE: FULL FAM COM: n/a  Sanjuan Dame, MD Internal Medicine Resident PGY-2 PAGER: [phone] 07/03/2021 6:18 PM  If  after hours (below), please contact on-call pager: [phone] 5PM-7AM Monday-Friday 1PM-7AM Saturday-Sunday

## 2021-07-04 DIAGNOSIS — I959 Hypotension, unspecified: Secondary | ICD-10-CM | POA: Diagnosis not present

## 2021-07-04 LAB — PROTEIN ELECTROPHORESIS, SERUM
A/G Ratio: 1 (ref 0.7–1.7)
Albumin ELP: 2.6 g/dL — ABNORMAL LOW (ref 2.9–4.4)
Alpha-1-Globulin: 0.4 g/dL (ref 0.0–0.4)
Alpha-2-Globulin: 0.8 g/dL (ref 0.4–1.0)
Beta Globulin: 0.8 g/dL (ref 0.7–1.3)
Gamma Globulin: 0.5 g/dL (ref 0.4–1.8)
Globulin, Total: 2.6 g/dL (ref 2.2–3.9)
Total Protein ELP: 5.2 g/dL — ABNORMAL LOW (ref 6.0–8.5)

## 2021-07-04 NOTE — Progress Notes (Addendum)
° °  HD#4 SUBJECTIVE:  Patient Summary: Patrick [name] is a 71 y.o. with a pertinent PMH of HIV, HTN, and chronic lumbar DDD, who presented with anemia and hypotension and admitted for further workup and evaluation of probable prostate cancer with diffuse skeletal lytic lesions.  Overnight Events: No acute events overnight  Interim History: This is hospital day 4 for Patrick [name] who was seen and evaluated at the bedside this morning. He reports that he slept well last night and that his back feels better. No acute complaints.   OBJECTIVE:  Vital Signs: Vitals:   07/03/21 1540 07/03/21 2010 07/04/21 0414 07/04/21 0416  BP: 111/79 121/79  128/83  Pulse: 96 92  (!) 106  Resp: 18 18  18   Temp: 99 F (37.2 C) 99.7 F (37.6 C)  100.2 F (37.9 C)  TempSrc: Oral Oral  Oral  SpO2: 97% 95%  96%  Weight:   62.5 kg   Height:       Supplemental O2: Room Air SpO2: 96 % O2 Flow Rate (L/min): 2 L/min  Filed Weights   07/03/21 0352 07/04/21 0414  Weight: 65.9 kg 62.5 kg     Intake/Output Summary (Last 24 hours) at 07/04/2021 0717 Last data filed at 07/04/2021 0415 Gross per 24 hour  Intake 300 ml  Output 1950 ml  Net -1650 ml   Net IO Since Admission: -2,722.5 mL [07/04/21 0717]  Physical Exam: General: Resting comfortably in bed, in no acute distress. CV: RRR. No murmurs, rubs, or gallops. No LE edema Pulmonary: Lungs CTAB. Normal effort on room air.  Skin: Warm and dry.  Neuro: A&Ox3, conversing appropriately. Improved strength in RLE, able to lift leg off bed now.  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 Will also likely start abiraterone and zometa per oncology. Patient has significant functional limitations due to this acute illness and recent weight loss. We will look for short term subacute rehabilitation for physical therapy with hopes of improving his functional status so that he can follow up with cancer center for palliative treatments. - Surgical pathology pending - Lidocaine patch prn - Norco 5-325 mg q6h prn - SNF placement pending  #Symptomatic normocytic anemia #Thrombocytopenia Likely secondary to hypoproliferation in the setting of widespread bony metastasis. Patient received 1u PRBC on 1/7 and feels improved. Hb 9.3 this morning.   #Moderate Protein-calorie malnutrition Likely secondary to underlying malignancy. - RD consulted, appreciate recs  Best Practice: Diet: Regular diet IVF: Fluids: none  VTE: Place and maintain sequential compression device Start: 07/01/21 0101 Code: Full AB: none Therapy Recs: SNF DISPO: Anticipated discharge to Skilled nursing facility pending  placement  .  Signature: Buddy Duty, D.O.  Internal Medicine Resident, PGY-1 [facility]  Pager: [phone] 7:17 AM, 07/04/2021   Please contact the on call pager after 5 pm and on weekends at [phone].

## 2021-07-04 NOTE — NC FL2 (Signed)
[place] LEVEL OF CARE SCREENING TOOL     IDENTIFICATION  Patient Name: Patrick [name] Birthdate: 27-Nov-1950 Sex: male Admission Date (Current Location): 06/30/2021  [facility] and [place] Number:  Herbalist and Address:  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 07/01/2021   Hypotension 06/30/2021   Anemia of chronic disease 06/30/2021   AKI (acute kidney injury) ([place]) 06/30/2021   Moderate protein-calorie malnutrition ([place]) 06/30/2021   Chronic lumbar pain 03/08/2021   Healthcare maintenance 10/11/2017   History of tobacco use 10/11/2017   Essential hypertension 12/31/2006   Hypercholesteremia 07/18/2006   AMBLYOPIA, 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   INGUINAL HERNIA, HX OF 07/18/2006   Human immunodeficiency virus (HIV) disease ([place]) 04/26/2001    Orientation RESPIRATION BLADDER Height & Weight     Self, Time, Place  Normal Continent Weight: 137 lb 12.6 oz (62.5 kg) Height:  5\' 10"  (177.8 cm)  BEHAVIORAL SYMPTOMS/MOOD NEUROLOGICAL BOWEL NUTRITION STATUS      Continent Diet (See DC summary)  AMBULATORY STATUS COMMUNICATION OF NEEDS Skin   Limited Assist Verbally Normal                       Personal Care Assistance Level of Assistance  Bathing, Feeding, Dressing Bathing Assistance: Limited assistance Feeding assistance: Independent Dressing Assistance: Limited assistance     Functional Limitations  Info  Sight, Hearing, Speech Sight Info: Adequate Hearing Info: Impaired Speech Info: Adequate    SPECIAL CARE FACTORS FREQUENCY  PT (By licensed PT), OT (By licensed OT)     PT Frequency: 5x week OT Frequency: 5x week            Contractures Contractures Info: Not present    Additional Factors Info  Code Status, Allergies Code Status Info: Full Allergies Info: NKA           Current Medications (07/04/2021):  This is the current hospital active medication list Current Facility-Administered Medications  Medication Dose Route Frequency Provider Last Rate Last Admin   acetaminophen (TYLENOL) tablet 650 mg  650 mg Oral Q6H WA Sanjuan Dame, MD   650 mg at 07/04/21 0900   emtricitabine-rilpivir-tenofovir AF (ODEFSEY) 200-25-25 MG per tablet 1 tablet  1 tablet Oral Q supper Axel Filler, MD       feeding supplement (ENSURE ENLIVE / ENSURE PLUS) liquid 237 mL  237 mL Oral TID BM Axel Filler, MD   237 mL at 07/03/21 1015   HYDROcodone-acetaminophen (NORCO/VICODIN) 5-325 MG per tablet 1 tablet  1 tablet Oral Q6H PRN Jose Persia, MD   1 tablet at 07/04/21 0423   lidocaine (LIDODERM) 5 % 1 patch  1 patch Transdermal Q24H Sanjuan Dame, MD   1 patch at 07/04/21 1026   multivitamin with minerals tablet 1 tablet  1 tablet Oral Daily Jose Persia, MD   1 tablet at 07/04/21 1026   polyethylene  glycol (MIRALAX / GLYCOLAX) packet 17 g  17 g Oral Daily Jose Persia, MD   17 g at 07/02/21 0915     Discharge Medications: Please see discharge summary for a list of discharge medications.  Relevant Imaging Results:  Relevant Lab Results:   Additional Information SS# [id number] [place]

## 2021-07-04 NOTE — TOC Initial Note (Signed)
Transition of Care [facility]) - Initial/Assessment Note    Patient Details  Name: Patrick [name] MRN: [id number] Date of Birth: May 10, 1951  Transition of Care [facility]) CM/SW Contact:    Emeterio Reeve, LCSW Phone Number: 07/04/2021, 11:27 AM  Clinical Narrative:                  CSW received SNF consult. CSW met with pt at bedside. CSW introduced self and explained role at the hospital. Pt reports that PTA he was living at home alone. Pt walked independently without DME, but pt reports it was becoming more difficult and he has had a few falls. Pt reports being independent with ADL's.   CSW reviewed PT/OT recommendations for SNF. Pt reports he is fine with SNF. Pt gave CSW permission to fax out to facilities in the area. Pt has no preference of facility at this time. CSW gave pt medicare.gov rating list to review. CSW explained insurance auth process. Pt reports they are NOT covid vaccinated.  CSW inquired about putting a family contact on chart. Pt declined at this time.   CSW will continue to follow.   Expected Discharge Plan: Skilled Nursing Facility Barriers to Discharge: Continued Medical Work up, Ship broker   Patient Goals and CMS Choice Patient states their goals for this hospitalization and ongoing recovery are:: to get better at [facility] CMS Medicare.gov Compare Post Acute Care list provided to:: Patient Choice offered to / list presented to : Patient  Expected Discharge Plan and Services Expected Discharge Plan: [place]       Living arrangements for the past 2 months: Single Family Home                                      Prior Living Arrangements/Services Living arrangements for the past 2 months: Single Family Home Lives with:: Self Patient language and need for interpreter reviewed:: Yes Do you feel safe going back to the place where you live?: Yes      Need for Family Participation in Patient Care: Yes (Comment) Care giver  support system in place?: No (comment)   Criminal Activity/Legal Involvement Pertinent to Current Situation/Hospitalization: No - Comment as needed  Activities of Daily Living Home Assistive Devices/Equipment: None ADL Screening (condition at time of admission) Patient's cognitive ability adequate to safely complete daily activities?: Yes Is the patient deaf or have difficulty hearing?: No Does the patient have difficulty seeing, even when wearing glasses/contacts?: No Does the patient have difficulty concentrating, remembering, or making decisions?: No Patient able to express need for assistance with ADLs?: Yes Does the patient have difficulty dressing or bathing?: Yes Independently performs ADLs?: Yes (appropriate for developmental age) Does the patient have difficulty walking or climbing stairs?: Yes Weakness of Legs: Right Weakness of Arms/Hands: None  Permission Sought/Granted Permission sought to share information with : Chartered certified accountant granted to share information with : Yes, Verbal Permission Granted     Permission granted to share info w AGENCY: SNF        Emotional Assessment Appearance:: Appears stated age Attitude/Demeanor/Rapport: Engaged Affect (typically observed): Appropriate Orientation: : Oriented to Self, Oriented to Place, Oriented to  Time, Oriented to Situation Alcohol / Substance Use: Not Applicable Psych Involvement: No (comment)  Admission diagnosis:  Back pain [M54.9] Right leg weakness [R29.898] Hypotension [I95.9] Mass of spine [M89.8X8] Anemia, unspecified type [D64.9] Patient Active Problem  List   Diagnosis Date Noted   Anemia    Prostate cancer metastatic to bone [facility])    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 07/01/2021   Hypotension 06/30/2021   Anemia of chronic disease 06/30/2021   AKI (acute kidney injury) ([place]) 06/30/2021   Moderate protein-calorie malnutrition ([place]) 06/30/2021   Chronic  lumbar pain 03/08/2021   Healthcare maintenance 10/11/2017   History of tobacco use 10/11/2017   Essential hypertension 12/31/2006   Hypercholesteremia 07/18/2006   AMBLYOPIA, 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  Emeterio Reeve, LCSW Clinical Social Worker

## 2021-07-05 ENCOUNTER — Other Ambulatory Visit: Payer: Self-pay | Admitting: Hematology

## 2021-07-05 DIAGNOSIS — I959 Hypotension, unspecified: Secondary | ICD-10-CM | POA: Diagnosis not present

## 2021-07-05 DIAGNOSIS — C61 Malignant neoplasm of prostate: Secondary | ICD-10-CM

## 2021-07-05 DIAGNOSIS — D638 Anemia in other chronic diseases classified elsewhere: Secondary | ICD-10-CM

## 2021-07-05 LAB — SURGICAL PATHOLOGY

## 2021-07-05 MED ORDER — ZOLEDRONIC ACID 4 MG/5ML IV CONC
4.0000 mg | Freq: Once | INTRAVENOUS | Status: AC
  Administered 2021-07-06: 4 mg via INTRAVENOUS
  Filled 2021-07-05: qty 5

## 2021-07-05 MED ORDER — BICALUTAMIDE 50 MG PO TABS
50.0000 mg | ORAL_TABLET | Freq: Every day | ORAL | Status: DC
  Administered 2021-07-05 – 2021-07-06 (×2): 50 mg via ORAL
  Filled 2021-07-05 (×3): qty 1

## 2021-07-05 MED ORDER — LEUPROLIDE ACETATE 7.5 MG IM KIT
7.5000 mg | PACK | Freq: Once | INTRAMUSCULAR | Status: DC
  Filled 2021-07-05: qty 7.5

## 2021-07-05 NOTE — Progress Notes (Addendum)
HEMATOLOGY-ONCOLOGY PROGRESS NOTE  ASSESSMENT AND PLAN: 62 male with HIV    Diffuse bone mets, likely metastatic prostate cancer Normocytic anemia, secondary to bone metastasis Mild thrombocytopenia, secondary to bone metastasis Weight loss and moderate malnutrition  -PSA >3000.  Patient had a biopsy of the L5 paraspinal mass which resulted today.  Results consistent with metastatic prostate carcinoma.  Discussed the biopsy results with the patient today.  We again reviewed the incurable nature of his malignancy and treatment options. -We will begin Lupron today.  In the hospital, we will give Lupron 7.5 mg IM x1 dose.  We can look into transitioning him to the daily 3 to 62-month dosing as an outpatient. -He has no other visceral metastasis, I recommend abiraterone 1000mg  daily and prednisone 5mg  as his first line treatment in addition to Lupron.  -I also recommend Zometa 4 mg infusion once next week, for his diffuse bone metastasis, to reduce his future risk of fractures. -Patient has bilateral lower extremity paresthesia and weakness (more on R), I will review his MRI with radiation oncologist, to see if he benefit from a course of palliative radiation to lumbar spine.   Mikey Bussing, DNP, AGPCNP-BC, AOCNP  SUBJECTIVE: Mr. Lamonte Sakai reports that he was up with physical therapy today.  He sat in recliner for at least an hour.  He was in a brace when he was up moving and states that the brace was rubbing on some of his bones causing some pain.  He is more comfortable now that he is back in the bed.  Otherwise, he has no complaints today.  REVIEW OF SYSTEMS:   Review of Systems  Constitutional:  Positive for malaise/fatigue. Negative for chills and fever.  HENT: Negative.    Eyes: Negative.   Respiratory: Negative.    Cardiovascular: Negative.   Gastrointestinal: Negative.   Genitourinary: Negative.   Musculoskeletal:  Positive for back pain.  Skin:  Negative for itching and rash.   Neurological: Negative.   Endo/Heme/Allergies: Negative.   Psychiatric/Behavioral: Negative.     I have reviewed the past medical history, past surgical history, social history and family history with the patient and they are unchanged from previous note.   PHYSICAL EXAMINATION: ECOG PERFORMANCE STATUS: 2 - Symptomatic, <50% confined to bed  Vitals:   07/05/21 0437 07/05/21 0900  BP: 132/80 139/87  Pulse: 100 100  Resp: 17 19  Temp: 98.4 F (36.9 C) 98.2 F (36.8 C)  SpO2: 96% 99%   Filed Weights   07/03/21 0352 07/04/21 0414  Weight: 65.9 kg 62.5 kg    Intake/Output from previous day: 01/09 0701 - 01/10 0700 In: 480 [P.O.:480] Out: 2250 [Urine:2250]  Physical Exam Vitals reviewed.  Constitutional:      General: He is not in acute distress. HENT:     Head: Normocephalic and atraumatic.     Mouth/Throat:     Mouth: Mucous membranes are moist.     Pharynx: No oropharyngeal exudate.  Eyes:     General: No scleral icterus.    Conjunctiva/sclera: Conjunctivae normal.  Cardiovascular:     Rate and Rhythm: Normal rate and regular rhythm.  Pulmonary:     Effort: Pulmonary effort is normal.     Breath sounds: Normal breath sounds.  Abdominal:     General: Bowel sounds are normal.     Palpations: Abdomen is soft.  Skin:    General: Skin is warm and dry.  Neurological:     Mental Status: He is alert and oriented  to person, place, and time.  Psychiatric:        Mood and Affect: Mood normal.        Behavior: Behavior normal.        Thought Content: Thought content normal.        Judgment: Judgment normal.    LABORATORY DATA:  I have reviewed the data as listed CMP Latest Ref Rng & Units 07/02/2021 07/01/2021 06/30/2021  Glucose 70 - 99 mg/dL 107(H) 113(H) 114(H)  BUN 8 - 23 mg/dL 12 20 31(H)  Creatinine 0.61 - 1.24 mg/dL 0.74 0.84 1.18  Sodium 135 - 145 mmol/L 135 130(L) 125(L)  Potassium 3.5 - 5.1 mmol/L 4.3 3.3(L) 2.7(LL)  Chloride 98 - 111 mmol/L 101 97(L) 92(L)   CO2 22 - 32 mmol/L 26 21(L) 23  Calcium 8.9 - 10.3 mg/dL 8.6(L) 8.4(L) 7.8(L)  Total Protein 6.5 - 8.1 g/dL - 5.1(L) -  Total Bilirubin 0.3 - 1.2 mg/dL - 0.6 -  Alkaline Phos 38 - 126 U/L - 593(H) -  AST 15 - 41 U/L - 45(H) -  ALT 0 - 44 U/L - 20 -    Lab Results  Component Value Date   WBC 5.7 07/03/2021   HGB 9.3 (L) 07/03/2021   HCT 28.9 (L) 07/03/2021   MCV 90.3 07/03/2021   PLT 120 (L) 07/03/2021   NEUTROABS 4.3 07/01/2021    No results found for: CEA1, CEA, CAN199, CA125, PSA1  DG Lumbar Spine 2-3 Views  Result Date: 07/01/2021 CLINICAL DATA:  Low back pain and right leg weakness. EXAM: LUMBAR SPINE - 2-3 VIEW COMPARISON:  None. FINDINGS: There is diffusely decreased mineralization of the bones. There are compression deformities at T11, T12, and L4. T11 and T12 are not well assessed due to limitations of exam. There is loss of vertebral body height at L4 anteriorly of approximately 30%. Alignment is within normal limits. Mild multilevel intervertebral disc space narrowing, degenerative endplate changes and facet arthropathy is noted. There is atherosclerotic calcification and aneurysmal dilatation of the distal abdominal aorta measuring up to 3.1 cm in diameter. IMPRESSION: 1. New compression deformities at T11, T12, and L4, indeterminate in age. 2. Multilevel degenerative changes. 3. Aortic atherosclerosis with aneurysmal dilatation of the distal abdominal aorta measuring 3.1 cm. Electronically Signed   By: Brett Fairy M.D.   On: 07/01/2021 00:22   MR BRAIN WO CONTRAST  Result Date: 07/01/2021 CLINICAL DATA:  Metastatic disease evaluation. EXAM: MRI HEAD WITHOUT CONTRAST TECHNIQUE: Multiplanar, multiecho pulse sequences of the brain and surrounding structures were obtained without intravenous contrast. COMPARISON:  None. FINDINGS: Brain: There is no evidence of an acute infarct, intracranial hemorrhage, midline shift, or extra-axial fluid collection. Small T2 hyperintensities in  the cerebral white matter bilaterally are nonspecific but compatible with minimal chronic small vessel ischemic disease. Mild cerebral atrophy is within normal limits for age. No mass is identified, however absence of IV contrast limits sensitivity for detection of small lesions. Vascular: Major intracranial vascular flow voids are preserved. Skull and upper cervical spine: Diffusely abnormal bone marrow signal throughout the skull and included upper cervical spine. Sinuses/Orbits: Unremarkable orbits. Trace bilateral mastoid fluid. Clear paranasal sinuses. Other: None. IMPRESSION: 1. No evidence of intracranial metastases on this noncontrast study. 2. Diffusely abnormal bone marrow signal consistent with known widespread osseous metastases. Electronically Signed   By: Logan Bores M.D.   On: 07/01/2021 10:02   MR Lumbar Spine Wo Contrast  Result Date: 07/01/2021 CLINICAL DATA:  Provided history: Neuropathy. Weakness  of right hip. Lumbar radiculopathy, immunocompromised; acute weakness. Additional history provided: Low back pain for 4 months, pain radiates down both legs. EXAM: MRI LUMBAR SPINE WITHOUT CONTRAST TECHNIQUE: Multiplanar, multisequence MR imaging of the lumbar spine was performed. No intravenous contrast was administered. COMPARISON:  CT chest/abdomen/pelvis 07/01/2021. FINDINGS: Segmentation: 5 lumbar vertebrae. The caudal most well-formed intervertebral disc space is designated L5-S1. Alignment: Bony retropulsion at the T11, L3 and L4 levels. Most notably, bony retropulsion at the level of the L3 inferior endplate measures 5 mm. Vertebrae: There is extensive multifocal osseous signal abnormality compatible with diffuse osseous metastatic disease. There is background diffuse abnormal T1 hypointense marrow signal, suggesting chronic anemia. There are multilevel presumed pathologic vertebral compression fractures as follows. T11 compression fracture (40% height loss). T12 compression fracture (less  than 80% height loss). Superimposed Schmorl node within the T12 inferior endplate. L2 compression fracture (40-50% height loss posteriorly). L3 compression fracture (50% height loss). L4 superior endplate compression fracture (30-40% height loss). Also of note, there is fairly extensive soft tissue tumor replacing the posterior elements at L5, most notably within the spinous process and bilateral articular pillars/laminae. This soft tissue tumor appears to partly encroach upon the spinal canal at this level. Conus medullaris and cauda equina: Conus extends to the L1 level. No signal abnormality within the visualized distal spinal cord. Paraspinal and other soft tissues: Small right renal cysts. Paraspinal soft tissues unremarkable. Disc levels: Mild disc degeneration within the lumbar or visualized lower thoracic spine. 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 T10-T11: Imaged sagittally. Facet arthrosis/ligamentum flavum hypertrophy. 4 mm bony retropulsion at the level of the T11 superior endplate. Resultant moderate spinal canal stenosis. There is contact upon the ventral aspect of the spinal cord with mild spinal cord flattening (series 3, image 9). Apparent mild bilateral neural foraminal narrowing. T11-T12: Imaged sagittally. Facet arthrosis/ligamentum flavum hypertrophy. No significant disc herniation or stenosis. T12-L1: Slight disc bulge. Minimal facet arthrosis. No significant spinal canal or foraminal stenosis. L1-L2: Small disc bulge. Mild facet arthrosis/ligamentum flavum hypertrophy. Mild bilateral subarticular and central canal narrowing (without appreciable nerve root impingement. Mild right Neural foraminal narrowing L2-L3: Minimal bony retropulsion. Disc bulge. Moderate facet arthrosis with ligamentum flavum hypertrophy. Epidural lipomatosis. Severe spinal canal stenosis with impingement of the descending cauda equina nerve roots. Bilateral neural foraminal narrowing  (moderate right, severe left). L3-L4: Bony retropulsion, greatest at the level of the L3 inferior endplate. Advanced facet arthrosis with ligamentum flavum hypertrophy. Epidural lipomatosis. Severe spinal canal stenosis with impingement of the descending cauda equina nerve roots at the L3 vertebral body level and L3-L4 disc level. Severe spinal canal stenosis is also present at the L4 vertebral body level with epidural lipomatosis contributing significantly. There is also severe spinal canal stenosis at the L4 vertebral body level with epidural lipomatosis contributing significantly at this level. Severe bilateral neural foraminal narrowing. L4-L5: Disc bulge. Advanced facet arthrosis with ligamentum flavum hypertrophy. Markedly severe spinal canal stenosis with impingement of the descending cauda equina nerve roots. Bilateral neural foraminal narrowing (moderate/severe right, severe left). L5-S1: Bony expansion, soft tissue tumor extension and epidural lipomatosis contribute to severe spinal canal stenosis with impingement of the descending cauda equina nerve roots at the L5 vertebral body level. At the L5-S1 disc level, there is a disc bulge with endplate spurring. Superimposed shallow broad-based center/left subarticular disc protrusion at site of posterior annular fissure. Mild facet arthrosis. Minimal ligamentum flavum hypertrophy. The disc protrusion contributes to mild left subarticular narrowing,  contacting and with possible contact upon the descending left S1 nerve root. No significant foraminal stenosis. IMPRESSION: Findings compatible with diffuse osseous metastatic disease. There is fairly extensive soft tissue tumor replacement of the L5 posterior elements, most notably involving the spinous process and bilateral articular pillars/laminae. Background diffuse abnormal T1 hypointense marrow signal suggesting chronic anemia. Presumed pathologic compression fractures at T11, T12, L2, L3 and L4. Multilevel  bony retropulsion, greatest at the level of the L3 inferior endplate (measuring 5 mm at this site). Lumbar spondylosis, epidural lipomatosis and congenitally narrow lumbar spinal canal. Multilevel multifactorial spinal canal stenosis, as detailed. Most notably, there is multifactorial severe spinal canal stenosis with cauda equina nerve root impingement at the L2-L3 disc level, L3 vertebral body level, L3-L4 disc level, L4 vertebral body level, L4-L5 disc level and L5 vertebral body level. Notably, soft tissue tumor encroachment contributes to severe spinal canal stenosis at the L5 level. Also of note, bony retropulsion contributes to moderate spinal canal stenosis at T10-T11. There is contact upon the ventral spinal cord (with mild spinal cord flattening) at this level. Multilevel foraminal stenosis, as detailed and greatest on the left at L2-L3 (severe), bilaterally at L3-L4 (severe) and bilaterally at L4-L5 (moderate/severe right, severe left). Electronically Signed   By: Kellie Simmering D.O.   On: 07/01/2021 16:35   CT CHEST ABDOMEN PELVIS W CONTRAST  Result Date: 07/01/2021 CLINICAL DATA:  Unintentional weight loss, elevated serum ferritin, fatigue EXAM: CT CHEST, ABDOMEN, AND PELVIS WITH CONTRAST TECHNIQUE: Multidetector CT imaging of the chest, abdomen and pelvis was performed following the standard protocol during bolus administration of intravenous contrast. CONTRAST:  173mL OMNIPAQUE IOHEXOL 300 MG/ML  SOLN COMPARISON:  None. FINDINGS: CT CHEST FINDINGS Cardiovascular: Mild coronary artery calcification. Global cardiac size within normal limits. No pericardial effusion. Central pulmonary arteries are of normal caliber. The thoracic aorta is dilated measuring 4.9 cm in its ascending segment and 3.2 cm in its proximal descending segment. Distally, the descending thoracic aorta is of normal caliber. Minimal atherosclerotic calcification. Mediastinum/Nodes: No enlarged mediastinal, hilar, or axillary lymph  nodes. Thyroid gland, trachea, and esophagus demonstrate no significant findings. Lungs/Pleura: Mild right basilar atelectasis. Lungs are otherwise clear. No pneumothorax or pleural effusion. Central airways are widely patent. Musculoskeletal: Pathologic fracture of the T11 vertebral body is seen with approximately 50% loss of height and retropulsion of the posterosuperior aspect of the vertebral body by approximately 3 mm. No significant resultant canal stenosis. The posterior elements appear intact. A a pathologic fracture of the inferior endplate of P37 is also identified with minimal loss of height. Sclerotic metastases are seen scattered throughout the thoracic spine, best noted within the T5 vertebral body. There are numerous mixed lytic and sclerotic metastases within the ribs bilaterally with pathologic fracture the left third, fourth and right seventh ribs. CT ABDOMEN PELVIS FINDINGS Hepatobiliary: No focal liver abnormality is seen. No gallstones, gallbladder wall thickening, or biliary dilatation. Pancreas: Unremarkable Spleen: Unremarkable Adrenals/Urinary Tract: The adrenal glands are unremarkable. The kidneys are normal in size and position. Tiny cortical hypodensity within the interpolar region of the left kidney is not well characterized on this examination but likely represents a tiny cortical cyst. Multiple nonobstructing calculi are seen within the right kidney measuring up to 3 mm. No ureteral calculi. No hydronephrosis. The bladder is unremarkable. Stomach/Bowel: Stomach is within normal limits. Appendix appears normal. No evidence of bowel wall thickening, distention, or inflammatory changes. Vascular/Lymphatic: Moderate aortoiliac atherosclerotic calcification. No aortic aneurysm. No pathologic adenopathy within the  abdomen and pelvis. Reproductive: The prostate gland is moderately enlarged. Other: No abdominal wall hernia.  Rectum unremarkable. Musculoskeletal: There are innumerable mixed  lytic and sclerotic metastases noted throughout the lumbar spine and pelvis with pathologic fracture of the L2, L3, and L4 vertebral bodies. There is retropulsion of the posteroinferior aspect of the L3 vertebral body by approximately 8 mm which results in marked central canal stenosis. While many of the osseous metastases do not demonstrate significant soft tissue component, there is extensive involvement of the spinous process of L5 which demonstrates a notable soft tissue component bilaterally best seen on axial image # 86/3. There is severe multifactorial canal stenosis at L4-5 noted. 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 Numerous pathologic fractures involving the ribs bilaterally as well as the T11, T12, L2, L3, and L4 vertebral bodies. Retropulsion of T11 and L3 results in severe central canal stenosis at L3. Multifactorial severe central canal stenosis also noted at L4, in part related to metastatic disease. Prominent soft tissue component involving the metastasis involving the spinous process of L5 may provide a a appropriate target for tissue sampling for further evaluation. Mild coronary artery calcification. Thoracic aortic aneurysm with maximal transaxial dimension of 4.9 cm. Ascending thoracic aortic aneurysm. Recommend semi-annual imaging followup by CTA or MRA and referral to cardiothoracic surgery if not already obtained. This recommendation follows 2010 ACCF/AHA/AATS/ACR/ASA/SCA/SCAI/SIR/STS/SVM Guidelines for the Diagnosis and Management of Patients With Thoracic Aortic Disease. Circulation. 2010; 121: H702-O378. Aortic aneurysm NOS (ICD10-I71.9) Moderate enlargement of the prostate gland. Correlation with serum PSA may be helpful. Electronically Signed   By: Fidela Salisbury M.D.   On: 07/01/2021 00:52   CT BIOPSY  Result Date: 07/01/2021 INDICATION: Metastatic disease of unknown primary.  Lumbar paraspinous mass. EXAM: CT-GUIDED BIOPSY OF L5 PARASPINOUS MASS  COMPARISON:  CT CAP, 07/01/2021.  MR L-spine, same day. MEDICATIONS: None. ANESTHESIA/SEDATION: Fentanyl 50 mcg IV; Versed 1 mg IV Sedation time: 27 minutes; The patient was continuously monitored during the procedure by the interventional radiology nurse under my direct supervision. CONTRAST:  None. COMPLICATIONS: None immediate. PROCEDURE: Informed consent was obtained from the patient following an explanation of the procedure, risks, benefits and alternatives. A time out was performed prior to the initiation of the procedure. The patient was positioned prone on the CT table and a limited CT was performed for procedural planning demonstrating L5 paraspinous mass. The procedure was planned. The operative site was prepped and draped in the usual sterile fashion. Appropriate trajectory was confirmed with a 22 gauge spinal needle after the adjacent tissues were anesthetized with 1% Lidocaine with epinephrine. Under intermittent CT guidance, a 17 gauge coaxial needle was advanced into the peripheral aspect of the mass. Appropriate positioning was confirmed and 3 samples were obtained with an 18 gauge core needle biopsy device. The co-axial needle was removed and hemostasis was achieved with manual compression. A limited postprocedural CT was negative for hemorrhage or additional complication. A dressing was placed. The patient tolerated the procedure well without immediate postprocedural complication. IMPRESSION: Successful CT-guided core needle biopsy of L5 paraspinous mass, as above. Michaelle Birks, MD [facility] Electronically Signed   By: Michaelle Birks M.D.   On: 07/01/2021 16:23   ECHOCARDIOGRAM COMPLETE  Result Date: 07/01/2021    ECHOCARDIOGRAM REPORT   Patient Name:   SOPHEAP BOEHLE III Date of Exam: 07/01/2021 Medical Rec #:  [id number]         Height:  70.0 in Accession #:    [id number]        Weight:       145.3 lb Date of Birth:  1950/10/25         BSA:          1.822 m Patient Age:    71 years          BP:           102/68 mmHg Patient Gender: M                 HR:           87 bpm. Exam Location:  Inpatient Procedure: 2D Echo Indications:    abnormal ecg  History:        Patient has no prior history of Echocardiogram examinations.                 HIV, Signs/Symptoms:back pain; Risk Factors:Hypertension,                 Dyslipidemia and Former Smoker.  Sonographer:    Johny Chess RDCS Referring Phys: [id number] [place]  1. Mild mid-apical anterior hypokinesis. Left ventricular ejection fraction, by estimation, is 60 to 65%. The left ventricle has normal function. The left ventricle demonstrates regional wall motion abnormalities (see scoring diagram/findings for description). There is mild left ventricular hypertrophy of the septal segment. Left ventricular diastolic parameters are consistent with Grade I diastolic dysfunction (impaired relaxation).  2. Right ventricular systolic function is normal. The right ventricular size is normal. There is mildly elevated pulmonary artery systolic pressure.  3. Left atrial size was mildly dilated.  4. The mitral valve is normal in structure. Trivial mitral valve regurgitation. No evidence of mitral stenosis.  5. The aortic valve is tricuspid. Aortic valve regurgitation is severe. No aortic stenosis is present. Aortic regurgitation PHT measures 278 msec.  6. Aortic dilatation noted. 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  7. The inferior vena cava is normal in size with greater than 50% respiratory variability, suggesting right atrial pressure of 3 mmHg. FINDINGS  Left Ventricle: Mild mid-apical anterior hypokinesis. Left ventricular ejection fraction, by estimation, is 60 to 65%. The left ventricle has normal function. The left ventricle demonstrates regional wall motion abnormalities. The left ventricular internal cavity size was normal in size. There is mild  left ventricular hypertrophy of the septal segment. Left ventricular diastolic parameters are consistent with Grade I diastolic dysfunction (impaired relaxation). Right Ventricle: The right ventricular size is normal. No increase in right ventricular wall thickness. Right ventricular systolic function is normal. There is mildly elevated pulmonary artery systolic pressure. The tricuspid regurgitant velocity is 2.95  m/s, and with an assumed right atrial pressure of 3 mmHg, the estimated right ventricular systolic pressure is 29.5 mmHg. Left Atrium: Left atrial size was mildly dilated. Right Atrium: Right atrial size was normal in size. Pericardium: There is no evidence of pericardial effusion. Mitral Valve: The mitral valve is normal in structure. Trivial mitral valve regurgitation. No evidence of mitral valve stenosis. Tricuspid Valve: The tricuspid valve is normal in structure. Tricuspid valve regurgitation is trivial. No evidence of tricuspid stenosis. Aortic Valve: The aortic valve is tricuspid. Aortic valve regurgitation is severe. Aortic regurgitation PHT measures 278 msec. No aortic stenosis is present. Pulmonic Valve: The pulmonic valve was normal in structure. Pulmonic valve regurgitation is not visualized. No evidence of pulmonic stenosis. Aorta: Aortic dilatation noted. There is moderate dilatation  of the ascending aorta, measuring 49 mm. Venous: The inferior vena cava is normal in size with greater than 50% respiratory variability, suggesting right atrial pressure of 3 mmHg. IAS/Shunts: No atrial level shunt detected by color flow Doppler.  LEFT VENTRICLE PLAX 2D LVIDd:         5.60 cm   Diastology LVIDs:         3.10 cm   LV e' medial:  7.43 cm/s LV PW:         1.00 cm   LV e' lateral: 11.10 cm/s LV IVS:        1.20 cm LVOT diam:     2.30 cm LV SV:         101 LV SV Index:   56 LVOT Area:     4.15 cm  RIGHT VENTRICLE RV S prime:     12.40 cm/s TAPSE (M-mode): 2.1 cm LEFT ATRIUM             Index         RIGHT ATRIUM           Index LA diam:        3.60 cm 1.98 cm/m   RA Area:     15.00 cm LA Vol (A2C):   59.8 ml 32.82 ml/m  RA Volume:   32.60 ml  17.89 ml/m LA Vol (A4C):   58.6 ml 32.16 ml/m LA Biplane Vol: 60.5 ml 33.20 ml/m  AORTIC VALVE LVOT Vmax:   153.00 cm/s LVOT Vmean:  95.300 cm/s LVOT VTI:    0.244 m AI PHT:      278 msec  AORTA Ao Root diam: 3.60 cm Ao Asc diam:  4.90 cm TRICUSPID VALVE TR Peak grad:   34.8 mmHg TR Vmax:        295.00 cm/s  SHUNTS Systemic VTI:  0.24 m Systemic Diam: 2.30 cm Skeet Latch MD Electronically signed by Skeet Latch MD Signature Date/Time: 07/01/2021/4:07:42 PM    Final      No future appointments.    LOS: 4 days   Addendum  I have seen the patient, examined him. I agree with the assessment and and plan and have edited the notes.   I discussed his bone biopsy results with pt today, which confirmed metastatic prostate cancer. Plan to start him on Lupron injection today, along with Bicalutamide for 4 weeks to prevent disease flare. Will give first dose Zometa tomorrow. I will also prescribe abiraterone 1000mg  daily and prednisone 5mg  after his hospital discharge and plan to start in next 2-4 weeks. I will see him back in my clinic in 2-3 weeks for follow up.   Truitt Merle  07/05/2021

## 2021-07-05 NOTE — Progress Notes (Signed)
Physical Therapy Treatment Patient Details Name: Patrick [name] MRN: [id number] DOB: [birth year] Today's Date: 07/05/2021   History of Present Illness 71 year old person living with HIV sent to the emergency department 1/5 from the internal medicine clinic for hypotension, progressive anemia, and progressive weakness.  Imaging in the emergency department has shown diffuse skeletal lytic lesions consistent with a new widespread malignancy.  Presumed pathologic compression fractures at T11, T12, L2, L3 and  L4. Multilevel bony retropulsion, greatest at the level of the L3  inferior endplate (measuring 5 mm at this site).  Biopsy on L5 mass completed 1/6.  PMHx:  HIV, anemia, malnutrition, spinal pathological fractures,    PT Comments    Pt admitted with above diagnosis. Pt was able to ambulate a few steps with RW today but was limited by pain and is generally unsteady even with RW. Pt did have brace in place and states it does seem to help support him. Agree with SNF and updated frequency to appropriate frequency for pt.  Will follow acutely.  Pt currently with functional limitations due to balance and endurance deficits. Pt will benefit from skilled PT to increase their independence and safety with mobility to allow discharge to the venue listed below.      Recommendations for follow up therapy are one component of a multi-disciplinary discharge planning process, led by the attending physician.  Recommendations may be updated based on patient status, additional functional criteria and insurance authorization.  Follow Up Recommendations  Skilled nursing-short term rehab (<3 hours/day)     Assistance Recommended at Discharge Frequent or constant Supervision/Assistance  Patient can return home with the following A lot of help with walking and/or transfers;A lot of help with bathing/dressing/bathroom;Assistance with cooking/housework;Assist for transportation;Help with stairs or ramp for entrance    Equipment Recommendations  Rolling walker (2 wheels)    Recommendations for Other Services       Precautions / Restrictions Precautions Precautions: Fall Precaution Comments: spinal precautions Required Braces or Orthoses: Spinal Brace Spinal Brace: Thoracolumbosacral orthotic Restrictions Weight Bearing Restrictions: No Other Position/Activity Restrictions: spinal precautions     Mobility  Bed Mobility Overal bed mobility: Needs Assistance Bed Mobility: Sidelying to Sit;Sit to Sidelying;Rolling Rolling: Min assist Sidelying to sit: Min assist       General bed mobility comments: instructed pt on back precautions and pt needed min assist and min cues to EOB    Transfers Overall transfer level: Needs assistance Equipment used: Rolling walker (2 wheels) Transfers: Sit to/from Stand Sit to Stand: Mod assist;Min assist;From elevated surface           General transfer comment: Donned brace with total assist. Pt reports he will have trouble with this at home as he is alone. mod assist for power up.    Ambulation/Gait Ambulation/Gait assistance: Min assist;Mod assist Gait Distance (Feet): 15 Feet Assistive device: Rolling walker (2 wheels) Gait Pattern/deviations: Step-through pattern;Decreased stride length;Shuffle;Narrow base of support;Trunk flexed;Antalgic Gait velocity: reduced Gait velocity interpretation: <1.31 ft/sec, indicative of household ambulator   General Gait Details: Pt was able to ambulate a short distance.  Pt does not stand fully upright and was limited by pain today needing min assist to mod assist at times for balance.  Pt heavily reliant on RW and states his pain was limiting.  Also questionable knees buckling. Got pt in chair and called nurse for pain meds.   Stairs             Emergency planning/management officer  Modified Rankin (Stroke Patients Only)       Balance Overall balance assessment: Needs assistance Sitting-balance support: Feet  supported;No upper extremity supported Sitting balance-Leahy Scale: Fair     Standing balance support: Bilateral upper extremity supported;During functional activity;Reliant on assistive device for balance Standing balance-Leahy Scale: Poor Standing balance comment: mild bucklng appearance of knees due to weakness vs pt not standing up tall                            Cognition Arousal/Alertness: Awake/alert Behavior During Therapy: Flat affect Overall Cognitive Status: Impaired/Different from baseline Area of Impairment: Problem solving;Awareness;Safety/judgement;Following commands;Memory;Attention;Orientation                 Orientation Level: Situation Current Attention Level: Selective Memory: Decreased short-term memory Following Commands: Follows one step commands with increased time Safety/Judgement: Decreased awareness of safety;Decreased awareness of deficits Awareness: Intellectual Problem Solving: Slow processing;Requires verbal cues;Requires tactile cues General Comments: pt is quite unaware of safety, limited insight to risks of impulsive movement        Exercises General Exercises - Lower Extremity Ankle Circles/Pumps: AROM;Both;10 reps;Supine Long Arc Quad: AROM;Both;10 reps;Seated    General Comments        Pertinent Vitals/Pain Pain Assessment: Faces Faces Pain Scale: Hurts even more Pain Location: back Pain Descriptors / Indicators: Grimacing;Guarding Pain Intervention(s): Limited activity within patient's tolerance;Monitored during session;Repositioned;Patient requesting pain meds-RN notified    Home Living                          Prior Function            PT Goals (current goals can now be found in the care plan section) Acute Rehab PT Goals Patient Stated Goal: to get his strength back Progress towards PT goals: Progressing toward goals    Frequency    Min 2X/week      PT Plan Current plan remains  appropriate;Frequency needs to be updated    Co-evaluation              AM-PAC PT "6 Clicks" Mobility   Outcome Measure  Help needed turning from your back to your side while in a flat bed without using bedrails?: A Little Help needed moving from lying on your back to sitting on the side of a flat bed without using bedrails?: A Little Help needed moving to and from a bed to a chair (including a wheelchair)?: A Lot Help needed standing up from a chair using your arms (e.g., wheelchair or bedside chair)?: A Lot Help needed to walk in hospital room?: A Lot Help needed climbing 3-5 steps with a railing? : Total 6 Click Score: 13    End of Session Equipment Utilized During Treatment: Gait belt Activity Tolerance: Patient limited by fatigue;Patient limited by pain Patient left: with call bell/phone within reach;in chair;with chair alarm set Nurse Communication: Mobility status;Patient requests pain meds PT Visit Diagnosis: Unsteadiness on feet (R26.81);Difficulty in walking, not elsewhere classified (R26.2);Muscle weakness (generalized) (M62.81)     Time: 1779-3903 PT Time Calculation (min) (ACUTE ONLY): 19 min  Charges:  $Gait Training: 8-22 mins                     Connelly [place] M,PT Acute Rehab Services [phone] (pager)    Alvira Philips 07/05/2021, 12:03 PM

## 2021-07-05 NOTE — Progress Notes (Addendum)
HD#5 SUBJECTIVE:  Patient Summary: Patrick [name] is a 71 y.o. with a pertinent PMH of HIV, HTN, and chronic lumbar DDD, who presented with anemia and hypotension and admitted for prostate cancer with diffuse metastatic disease to thoracic and lumbar spine.   Overnight Events: No acute events overnight.  Interim History: This is hospital day 5 for Patrick [name] who was seen and evaluated at the bedside this morning. He states that he is having some pain in his tailbone, as he's been sitting in his chair for a while. Appetite remains stable and he ate his breakfast this morning.   OBJECTIVE:  Vital Signs: Vitals:   07/04/21 0831 07/04/21 1659 07/04/21 1935 07/05/21 0437  BP: (!) 135/96 127/82 117/77 132/80  Pulse: (!) 110 (!) 102 97 100  Resp: 20 20 17 17   Temp: 98.4 F (36.9 C) 99.1 F (37.3 C) 99.8 F (37.7 C) 98.4 F (36.9 C)  TempSrc: Oral Oral Oral Oral  SpO2: 97% 97% 95% 96%  Weight:      Height:       Supplemental O2: Room Air SpO2: 96 % O2 Flow Rate (L/min): 2 L/min  Filed Weights   07/03/21 0352 07/04/21 0414  Weight: 65.9 kg 62.5 kg     Intake/Output Summary (Last 24 hours) at 07/05/2021 0636 Last data filed at 07/05/2021 0981 Gross per 24 hour  Intake 480 ml  Output 2250 ml  Net -1770 ml   Net IO Since Admission: -4,492.5 mL [07/05/21 0636]  Physical Exam: General: Pleasant, elderly male resting comfortably in bedside chair. CV: RRR. No murmurs, rubs, or gallops. No LE edema Pulmonary: Lungs CTAB. Normal effort on room air Skin: Warm and dry.  Neuro: A&Ox3. Moves all extremities. Normal sensation. No focal deficit. 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 Likely plans for palliative radiation in the future, however, we are in the process of searching for short term rehab/getting insurance auth for SNF so patient can receive PT, with hopes of improving patient's functional status prior to treatment. Biopsy resulted today with confirmatory diagnosis of metastatic prostate cancer.  - Med onc consulted, appreciate recs  - Lidocaine patch prn - Norco 5-325 mg q6h prn - SNF insurance auth pending  #Normocytic anemia #Thrombocytopenia Secondary to hypoproliferation in setting of widespread bony mets. Received 1u PRBC on 1/7 with an improvement in symptoms. Not checking daily labs, as CBC has remained stable.   #Moderate protein-calorie malnutrition Secondary to underlying malignancy. Patient's appetite remains stable and he was able to eat his breakfast this morning. Appreciate RD's assistance.   Best Practice: Diet: Regular diet IVF: Fluids: none VTE: Place and maintain sequential compression device Start: 07/01/21 0101 Code: Full AB: none Therapy Recs: SNF, DME: walker DISPO: Anticipated discharge  in 1-2 days  to Skilled nursing facility pending  placement .  Signature: Buddy Duty, D.O.  Internal Medicine Resident, PGY-1 [facility]  Pager: [phone] 6:36 AM, 07/05/2021  Please contact the on call pager after 5 pm and on weekends at [phone].

## 2021-07-05 NOTE — TOC Progression Note (Signed)
Transition of Care [facility]) - Progression Note    Patient Details  Name: TYCEN DOCKTER MRN: [id number] Date of Birth: 11/26/1950  Transition of Care [facility]) CM/SW Contact  Emeterio Reeve, [place] Phone Number: 07/05/2021, 3:51 PM  Clinical Narrative:     CSW gave bed offers to pt. Pt stated he will review and have answer for CSW.  3:00pm- CSW spoke with pt by phone. Pt stated he was fine with [facility] but wanted to know if there were other snf options.   CSW reached out to [facility], they are able to accept pt if he brings his HIV meds to the facility. Pt stated he will have someone go to his house and get the meds. CSW started insurance with [employer].   Expected Discharge Plan: [place] Barriers to Discharge: Continued Medical Work up, Ship broker  Expected Discharge Plan and Services Expected Discharge Plan: [place] arrangements for the past 2 months: Single Family Home                                       Social Determinants of Health (SDOH) Interventions    Readmission Risk Interventions No flowsheet data found.  Emeterio Reeve, LCSW Clinical Social Worker

## 2021-07-06 ENCOUNTER — Telehealth: Payer: Self-pay | Admitting: Hematology

## 2021-07-06 DIAGNOSIS — M6281 Muscle weakness (generalized): Secondary | ICD-10-CM | POA: Diagnosis not present

## 2021-07-06 DIAGNOSIS — M6258 Muscle wasting and atrophy, not elsewhere classified, other site: Secondary | ICD-10-CM | POA: Diagnosis not present

## 2021-07-06 DIAGNOSIS — C61 Malignant neoplasm of prostate: Secondary | ICD-10-CM | POA: Diagnosis not present

## 2021-07-06 DIAGNOSIS — R41841 Cognitive communication deficit: Secondary | ICD-10-CM | POA: Diagnosis not present

## 2021-07-06 DIAGNOSIS — Z510 Encounter for antineoplastic radiation therapy: Secondary | ICD-10-CM | POA: Diagnosis not present

## 2021-07-06 DIAGNOSIS — E876 Hypokalemia: Secondary | ICD-10-CM | POA: Diagnosis not present

## 2021-07-06 DIAGNOSIS — D649 Anemia, unspecified: Secondary | ICD-10-CM | POA: Diagnosis not present

## 2021-07-06 DIAGNOSIS — N179 Acute kidney failure, unspecified: Secondary | ICD-10-CM | POA: Diagnosis not present

## 2021-07-06 DIAGNOSIS — I959 Hypotension, unspecified: Secondary | ICD-10-CM | POA: Diagnosis not present

## 2021-07-06 DIAGNOSIS — R195 Other fecal abnormalities: Secondary | ICD-10-CM | POA: Diagnosis not present

## 2021-07-06 DIAGNOSIS — M8450XA Pathological fracture in neoplastic disease, unspecified site, initial encounter for fracture: Secondary | ICD-10-CM | POA: Diagnosis not present

## 2021-07-06 DIAGNOSIS — C7951 Secondary malignant neoplasm of bone: Secondary | ICD-10-CM | POA: Diagnosis not present

## 2021-07-06 DIAGNOSIS — U071 COVID-19: Secondary | ICD-10-CM | POA: Diagnosis not present

## 2021-07-06 DIAGNOSIS — E440 Moderate protein-calorie malnutrition: Secondary | ICD-10-CM | POA: Diagnosis not present

## 2021-07-06 DIAGNOSIS — R609 Edema, unspecified: Secondary | ICD-10-CM | POA: Diagnosis not present

## 2021-07-06 DIAGNOSIS — G834 Cauda equina syndrome: Secondary | ICD-10-CM | POA: Diagnosis not present

## 2021-07-06 DIAGNOSIS — B20 Human immunodeficiency virus [HIV] disease: Secondary | ICD-10-CM | POA: Diagnosis not present

## 2021-07-06 LAB — RESP PANEL BY RT-PCR (FLU A&B, COVID) ARPGX2
Influenza A by PCR: NEGATIVE
Influenza B by PCR: NEGATIVE
SARS Coronavirus 2 by RT PCR: NEGATIVE

## 2021-07-06 MED ORDER — LEUPROLIDE ACETATE 7.5 MG IM KIT
7.5000 mg | PACK | Freq: Once | INTRAMUSCULAR | Status: AC
  Administered 2021-07-06: 7.5 mg via INTRAMUSCULAR
  Filled 2021-07-06: qty 7.5

## 2021-07-06 MED ORDER — LEUPROLIDE ACETATE 7.5 MG IM KIT
7.5000 mg | PACK | Freq: Once | INTRAMUSCULAR | Status: DC
Start: 2021-07-07 — End: 2021-07-06
  Filled 2021-07-06: qty 7.5

## 2021-07-06 MED ORDER — ENSURE ENLIVE PO LIQD: 237.0000 mL | Freq: Three times a day (TID) | ORAL | 60 refills | Status: AC

## 2021-07-06 MED ORDER — BICALUTAMIDE 50 MG PO TABS: 50.0000 mg | ORAL_TABLET | Freq: Every day | ORAL | Status: DC

## 2021-07-06 MED ORDER — LIDOCAINE 5 % EX PTCH: 1.0000 | MEDICATED_PATCH | CUTANEOUS | 0 refills | Status: DC

## 2021-07-06 NOTE — Progress Notes (Signed)
Occupational Therapy Treatment Patient Details Name: Patrick [name] MRN: [id number] DOB: 04-24-1951 Today's Date: 07/06/2021   History of present illness 71 year old person living with HIV sent to the emergency department 1/5 from the internal medicine clinic for hypotension, progressive anemia, and progressive weakness.  Imaging in the emergency department has shown diffuse skeletal lytic lesions consistent with a new widespread malignancy.  Presumed pathologic compression fractures at T11, T12, L2, L3 and  L4. Multilevel bony retropulsion, greatest at the level of the L3  inferior endplate (measuring 5 mm at this site).  Biopsy on L5 mass completed 1/6.  PMHx:  HIV, anemia, malnutrition, spinal pathological fractures,   OT comments  Patient with fair progress toward patient focused goals.  Patient with less pain this date.  Able to walk to the bathroom with [employer] and cues for RW safety.  Patient able to tolerate 3 min of stand grooming, and then needed to finish from a seated position with setup.  Lower body dressing with Min A from a sit/stand level.  Post acute rehab at SNF continues to be recommended.  Patient does not have the appropriate assist at home, and needing up to Mod A.  OT to continue efforts in the acute setting.     Recommendations for follow up therapy are one component of a multi-disciplinary discharge planning process, led by the attending physician.  Recommendations may be updated based on patient status, additional functional criteria and insurance authorization.    Follow Up Recommendations  Skilled nursing-short term rehab (<3 hours/day)    Assistance Recommended at Discharge    Patient can return home with the following  A lot of help with walking and/or transfers;A lot of help with bathing/dressing/bathroom;Assist for transportation;Assistance with cooking/housework   Equipment Recommendations  BSC/3in1;Tub/shower bench    Recommendations for Other Services       Precautions / Restrictions Precautions Precautions: Fall Precaution Comments: spinal precautions Required Braces or Orthoses: Spinal Brace Restrictions Other Position/Activity Restrictions: spinal precautions       Mobility Bed Mobility   Bed Mobility: Supine to Sit   Sidelying to sit: Min guard            Transfers Overall transfer level: Needs assistance Equipment used: Rolling walker (2 wheels) Transfers: Sit to/from Stand Sit to Stand: Min assist;From elevated surface   Step pivot transfers: Min assist             Balance Overall balance assessment: Needs assistance Sitting-balance support: Feet supported;No upper extremity supported Sitting balance-Leahy Scale: Fair     Standing balance support: Bilateral upper extremity supported;During functional activity;Reliant on assistive device for balance Standing balance-Leahy Scale: Poor                             ADL either performed or assessed with clinical judgement   ADL Overall ADL's : Needs assistance/impaired     Grooming: Wash/dry hands;Wash/dry face;Set up;Sitting;Oral care;Standing           Upper Body Dressing : Min guard;Sitting   Lower Body Dressing: Minimal assistance;Sit to/from stand       Toileting- Water quality scientist and Hygiene: Sit to/from stand;Min guard       Functional mobility during ADLs: Minimal assistance;Rolling walker (2 wheels);Cueing for sequencing;Min guard      Extremity/Trunk Assessment  Cognition Arousal/Alertness: Awake/alert Behavior During Therapy: Flat affect                           Following Commands: Follows one step commands consistently     Problem Solving: Slow processing;Requires verbal cues;Requires tactile cues             Pertinent Vitals/ Pain       Pain Assessment: Faces Faces Pain Scale: Hurts little more Pain Location: back, L leg Pain Descriptors /  Indicators: Grimacing;Guarding Pain Intervention(s): Monitored during session  Home Living Family/patient expects to be discharged to:: Private residence Living Arrangements: Alone Available Help at Discharge: Friend(s);Available PRN/intermittently Type of Home: House Home Access: Stairs to enter                                              Frequency  Min 2X/week        Progress Toward Goals  OT Goals(current goals can now be found in the care plan section)  Progress towards OT goals: Progressing toward goals  Acute Rehab OT Goals OT Goal Formulation: With patient Time For Goal Achievement: 07/16/21 Potential to Achieve Goals: Good  Plan Discharge plan remains appropriate    Co-evaluation                 AM-PAC OT "6 Clicks" Daily Activity     Outcome Measure   Help from another person eating meals?: None Help from another person taking care of personal grooming?: None Help from another person toileting, which includes using toliet, bedpan, or urinal?: A Little Help from another person bathing (including washing, rinsing, drying)?: A Lot Help from another person to put on and taking off regular upper body clothing?: A Little   6 Click Score: 16    End of Session Equipment Utilized During Treatment: Rolling walker (2 wheels)  OT Visit Diagnosis: Unsteadiness on feet (R26.81);Muscle weakness (generalized) (M62.81);Other symptoms and signs involving cognitive function   Activity Tolerance Patient tolerated treatment well   Patient Left in chair;with call bell/phone within reach;with chair alarm set   Nurse Communication Mobility status        Time: 1030-1053 OT Time Calculation (min): 23 min  Charges: OT General Charges $OT Visit: 1 Visit OT Treatments $Self Care/Home Management : 23-37 mins  07/06/2021  RP, OTR/L  Acute Rehabilitation Services  Office:  [phone]   Metta Clines 07/06/2021, 12:09 PM

## 2021-07-06 NOTE — TOC Transition Note (Signed)
Transition of Care [facility]) - CM/SW Discharge Note   Patient Details  Name: Patrick [name] MRN: [id number] Date of Birth: 1950-12-11  Transition of Care [facility]) CM/SW Contact:  Emeterio Reeve, LCSW Phone Number: 07/06/2021, 10:06 AM   Clinical Narrative:     Per MD patient ready for DC to [facility]. RN, patient, patient's family, and facility notified of DC. Discharge Summary and FL2 sent to facility. DC packet on chart. Insurance Josem Kaufmann has been received (Auth Id [id number]) and pt is covid negative. Ambulance transport [place] [id number]) requested for patient.    RN to call report to [phone].   CSW will sign off for now as social work intervention is no longer needed. Please consult [place] again if new needs arise.   Final next level of care: Skilled Nursing Facility Barriers to Discharge: Barriers Resolved   Patient Goals and CMS Choice Patient states their goals for this hospitalization and ongoing recovery are:: to get better at [facility] CMS Medicare.gov Compare Post Acute Care list provided to:: Patient Choice offered to / list presented to : Patient  Discharge Placement              Patient chooses bed at: [facility] Patient to be transferred to facility by: [facility]tar Name of family member notified: Pt declined, alert x4 Patient and family notified of of transfer: 07/06/21  Discharge Plan and Services                                     Social Determinants of Health (SDOH) Interventions     Readmission Risk Interventions No flowsheet data found.  Emeterio Reeve, LCSW Clinical Social Worker

## 2021-07-06 NOTE — Progress Notes (Signed)
Discharge instructions given to pt. Pt verbalized understanding of all teaching. IV removed.

## 2021-07-06 NOTE — Telephone Encounter (Signed)
Scheduled appt per 1/10 staff msg from Dr. Burr Medico. Called pt, no answer. Left msg with appt date and time and requested for pt to call me back to confirm appt.

## 2021-07-06 NOTE — Discharge Instructions (Signed)
Dear [name],  You were hospitalized and found to have prostate cancer that has now spread to your thoracic and lumbar spine (low back). The oncologist saw you while you were here and discussed starting treatments with you. You will need to follow up with them when you are discharged from the hospital regarding further treatment, and possibly starting radiation. For your back pain, you can continue to use the lidocaine patches for the pain. Working with PT to build up your strength will be very valuable prior to starting cancer treatment.  As far as your nutrition, continue to drink Boost/Ensure drinks, to keep your protein and calories up. This is also important prior to starting cancer treatment.  Please follow up with the internal medicine clinic in ~2-3 weeks for a hospital follow up. They will call you with an appt.   If you have any questions or concerns, call our clinic at [phone] or after hours call [phone] and ask for the internal medicine resident on call.

## 2021-07-06 NOTE — Discharge Summary (Signed)
Name: Patrick [name] MRN: [id number] DOB: [birth year] 71 y.o. PCP: Patrick Manes, MD  Date of Admission: 06/30/2021  5:42 PM Date of Discharge:  07/06/2021 Attending Physician: Dr. 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   calcium citrate-vitamin D 315-200 MG-UNIT tablet Commonly known as: CITRACAL+D Take 1 tablet by mouth daily.   feeding supplement Liqd Take 237 mLs by mouth 3 (three) times daily between meals.   lidocaine 5 % Commonly known as: LIDODERM Place 1 patch onto the skin daily. Remove & Discard patch within 12 hours or as directed by MD   multivitamin with minerals tablet Take 1 tablet by mouth daily.   naproxen sodium 220 MG tablet Commonly known as: ALEVE Take 220-440 mg by mouth See admin instructions. 440 mg in the morning 220 mg in the afternoon and at bedtime   Odefsey 200-25-25 MG Tabs tablet Generic drug: emtricitabine-rilpivir-tenofovir AF TAKE 1  TABLET BY MOUTH EVERY DAY What changed:  how to take this when to take this        DISPOSITION AND FOLLOW-UP:  [name] was discharged from [facility] in Stable condition. At the hospital follow up visit please address:  Metastatic prostate cancer: ensure oncology follow up established, and treatment has started. Assess how patient has been tolerating this and how back pain is- was given lidoderm patches.  Protein calorie malnutrition: Assess needs and how he is tolerating Boost/Ensure.  Anemia: secondary to hypoproliferation, check CBC and for symptomatic anemia New bifascicular block: repeat ekg at some point  Follow-up Recommendations: Consults: Oncology Labs:  CBC, CMP, Ferritin, PSA, and testosterone level to be performed in 3 months Studies: EKG Medications:  START bicalutamide 50 mg daily per oncology Boost/Ensure feeding supplements Lidoderm patch prn   Follow-up Appointments:  Follow-up Information     Patrick Manes, MD. Go in 2 day(s).   Specialty: Internal Medicine Contact information: [place] [address] [phone]         Patrick Riches, MD .   Specialty: Infectious Diseases Contact information: [place] [address] [phone]                 HOSPITAL COURSE:  Patient Summary: #Probable prostate cancer with diffuse skeletal lytic lesions Patient initially presented with anemia, hypotension, and progressive weakness from the [facility]. He has had severe, unintentional weight loss, with elevated alk phos and ferritin, suspicious for underlying malignancy. Initial workup in the ED  diffuse skeletal lesions consistent with new widespread malignancy. PSA >3000. Consulted IR for bone biopsy to get a definitive diagnosis. MRI brain ruled out CNS mets. Patient does have diminished strength in his RLE, but no evidence of bowel or bladder dysfunction. Patient also noted  to be hyporeflexic, with negative Babinski in bilateral lower extremities. MRI lumber spine with pathologic compression fractures at T11, T12, L2, L3 and L4.  Also noted multifactorial severe spinal canal stenosis with cauda equina nerve root impingement at the L2-L3 disc level, L3 vertebral body level, L3-L4 disc level, L4 vertebral body level, L4-L5 disc level and L5 vertebral body level. Patient had successful CT-guided biopsy of L5 paraspinous mass with surgical pathology consistent with metastatic prostate cancer. Oncology was consulted with Lupron injections started on 1/12. Will also likely be started on bicalutamide and Zometa therapy as an outpatient. Recommended patient go to SNF for physical therapy with hopes of improving his functional status so that he can follow up with cancer center for palliative treatments. Medical oncology will coordinate with radiation oncology regarding palliative radiation to the lumbar spine.  ° °#AKI, resolved °Cr elevated at 1.5, with baseline around 1.0. AKI likely secondary to poor oral intake. AKI resolved with IVF on admission. ° °#Hypotension 2/2 volume depletion, resolved °Patient initially presented with hypotension, which improved with IVF.  °  °#Moderate Protein-calorie malnutrition °Likely secondary to underlying malignancy. RD consulted and recommended Ensure Enlive po TID.  ° °#Normocytic anemia °#Thrombocytopenia °Anemia likely secondary anemia of chronic disease, in the setting of hypoproliferation and widespread bone marrow suppression. No active signs of bleeding noted. Iron level WNL at 83 and ferritin markedly elevated at >3500, which could be secondary to prostate cancer. Likely secondary to hypoproliferation in the setting of widespread bony metastasis. Patient received 1u PRBC on 1/7 and feels improved. Hb 9.3 this morning and remained stable.  ° °#Hypokalemia °K 2.7 on admission, improved to 3.3 this morning, although still low. Repleted and K improved.   ° °#Bifascicular block °Bifascicular block noted on EKG, with no known prior hx of cardiac disease. Echo with mid-apical anterior hypokinesis, LVEF 60-65% and grade I diastolic dysfunction.   ° °DISCHARGE INSTRUCTIONS:  ° °Discharge Instructions   ° ° Call MD for:  persistant dizziness or light-headedness   Complete by: As directed °  ° Call MD for:  severe uncontrolled pain   Complete by: As directed °  ° Diet general   Complete by: As directed °  ° Increase activity slowly   Complete by: As directed °  ° °  °Dear Mr. Bynum, ° °You were hospitalized and found to have prostate cancer that has now spread to your thoracic and lumbar spine (low back). The oncologist saw you while you were here and discussed starting treatments with you. You will need to follow up with them when you are discharged from the hospital regarding further treatment, and possibly starting radiation. For your back pain, you can continue to use the lidocaine patches for the pain. Working with PT to build up your strength will be very valuable prior to starting cancer treatment. ° °As far as your nutrition, continue to drink Boost/Ensure drinks, to keep your protein and calories up. This is also important prior to starting cancer treatment. ° °Please follow up with the internal medicine clinic in ~2-3 weeks for a hospital follow up. They will call you with an appt.  ° °If you have any questions or concerns, call our clinic at [phone] or after   after hours call [phone] and ask for the internal medicine resident on call.   SUBJECTIVE:  Patrick [name] was seen and evaluated on the day of discharge. He states that his back pain is tolerable, and only bothers him when he walks around a lot. Overall is eager to go to rehab.   Discharge Vitals:   BP 126/82 (BP Location: Left Arm)    Pulse 100    Temp 98.2 F (36.8 C) (Oral)    Resp 16    Ht 5' 10" (1.778 m)    Wt 62.8 kg    SpO2 95%    BMI 19.87 kg/m   OBJECTIVE:  General:  Pleasant,elderly male in no acute distress. CV: RRR. No murmurs. No LE edema Pulmonary: Lungs CTAB. Normal effort.  Skin: Warm and dry.  Neuro: A&Ox3. Moves all extremities, with slightly diminished strength in RLE. Normal sensation.  Psych: Normal mood and affect   Pertinent Labs, Studies, and Procedures:  CBC Latest Ref Rng & Units 07/03/2021 07/02/2021 07/02/2021  WBC 4.0 - 10.5 K/uL 5.7 - 5.1  Hemoglobin 13.0 - 17.0 g/dL 9.3(L) 9.5(L) 8.0(L)  Hematocrit 39.0 - 52.0 % 28.9(L) 27.9(L) 24.5(L)  Platelets 150 - 400 K/uL 120(L) - 132(L)    CMP Latest Ref Rng & Units 07/02/2021 07/01/2021 06/30/2021  Glucose 70 - 99 mg/dL 107(H) 113(H) 114(H)  BUN 8 - 23 mg/dL 12 20 31(H)  Creatinine 0.61 - 1.24 mg/dL 0.74 0.84 1.18  Sodium 135 - 145 mmol/L 135 130(L) 125(L)  Potassium 3.5 - 5.1 mmol/L 4.3 3.3(L) 2.7(LL)  Chloride 98 - 111 mmol/L 101 97(L) 92(L)  CO2 22 - 32 mmol/L 26 21(L) 23  Calcium 8.9 - 10.3 mg/dL 8.6(L) 8.4(L) 7.8(L)  Total Protein 6.5 - 8.1 g/dL - 5.1(L) -  Total Bilirubin 0.3 - 1.2 mg/dL - 0.6 -  Alkaline Phos 38 - 126 U/L - 593(H) -  AST 15 - 41 U/L - 45(H) -  ALT 0 - 44 U/L - 20 -        Signed: Buddy Duty, D.O.  Internal Medicine Resident, PGY-1 [facility]  Pager: [phone] 12:41 PM, 07/06/2021

## 2021-07-07 DIAGNOSIS — C7951 Secondary malignant neoplasm of bone: Secondary | ICD-10-CM | POA: Diagnosis not present

## 2021-07-07 DIAGNOSIS — N179 Acute kidney failure, unspecified: Secondary | ICD-10-CM | POA: Diagnosis not present

## 2021-07-07 DIAGNOSIS — C61 Malignant neoplasm of prostate: Secondary | ICD-10-CM | POA: Diagnosis not present

## 2021-07-07 DIAGNOSIS — I959 Hypotension, unspecified: Secondary | ICD-10-CM | POA: Diagnosis not present

## 2021-07-08 ENCOUNTER — Telehealth: Payer: Self-pay | Admitting: Internal Medicine

## 2021-07-08 NOTE — Telephone Encounter (Signed)
TOC HFU appointment scheduled for 07/27/2021 at 2:15 pm and mailed to patient.

## 2021-07-08 NOTE — Telephone Encounter (Signed)
-----   Message from Dorethea Clan, DO sent at 07/06/2021 11:55 AM EST ----- Hi! Please schedule this patient for a hospital follow up in ~2.5-3 weeks. Thanks!

## 2021-07-11 ENCOUNTER — Telehealth: Payer: Self-pay | Admitting: Radiation Oncology

## 2021-07-11 ENCOUNTER — Telehealth: Payer: Self-pay

## 2021-07-11 NOTE — Telephone Encounter (Signed)
Pt has been notified that he should be expecting Dr. Tammi Klippel to setup discussing radiation to his lower back for pain control. Pt stated that he has spoke with him and has an appt setup already. No further questions or concerns from pt.

## 2021-07-11 NOTE — Telephone Encounter (Signed)
LVM to schedule consult with Dr. Manning.  ?

## 2021-07-11 NOTE — Telephone Encounter (Signed)
-----   Message from Evalee Jefferson, RN sent at 07/11/2021  9:56 AM EST ----- Regarding: FW: Palliative XRT to L-Spine Please let pt know about seeing Dr. Tammi Klippel for Radiation.  Thanks! ----- Message ----- From: Truitt Merle, MD Sent: 07/08/2021   3:44 PM EST To: Tyler Pita, MD, Evalee Jefferson, RN Subject: RE: Palliative XRT to L-Spine                  Great, thanks [facility]. He was discharged to a SNF.   Tammi Sou, please let pt know that he will be scheduled to see Dr. Tammi Klippel and discuss radiation to his low back for pain control, thanks   Krista Blue  ----- Message ----- From: Tyler Pita, MD Sent: 07/08/2021  10:42 AM EST To: Freeman Caldron, PA-C, Truitt Merle, MD, # Subject: Palliative XRT to L-Spine                      Krista Blue, I looked at MRI and agree, that palliative radiation especially to L5 may be helpful. We'll work on getting him in. Matt  ----- Message ----- From: Truitt Merle, MD Sent: 07/05/2021   8:39 PM EST To: Tyler Pita, MD  [facility],   When you return and get a chance, could you review his recent MRI to see if he would need palliative RT to his spinal mets? He has newly diagnosed prostate ca with diffuse bone mets. He has moderate low back pain, especially when he walks. He is going to be discharged to SNF in a few days.   Thanks   Krista Blue

## 2021-07-13 ENCOUNTER — Telehealth: Payer: Self-pay | Admitting: Hematology

## 2021-07-13 NOTE — Telephone Encounter (Signed)
Scheduled appointment per patients request via telephone call. Had to call patient back regarding appointments. Patient is aware.

## 2021-07-18 NOTE — Progress Notes (Signed)
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    ICD-10-CM   1. Malignant neoplasm of prostate ([place])  C61     2. Prostate cancer metastatic to bone [facility])  C61    C79.51       HISTORY OF PRESENT ILLNESS: Patrick [name] is a 71 y.o. male seen at the request of Dr. Burr Medico. He was initially referred to the ED by his PCP after he was found to he hypotensive and anemic at a follow up visit on 06/30/21. He also complained of low back pain with progressive right leg weakness and paraesthesias in his feet, ongoing for approximately 3 months. This prompted a lumbar spine x-ray, which revealed new compression deformities at T11, T12, and L4. PSA performed by his PCP that day resulted with a level of >3,000. A CT C/A/P was performed on 07/01/21 showing widespread osseous metastatic disease of unknown primary, with numerous pathologic fractures involving the ribs bilaterally as well as T11, T12, L2, L3, and L4 vertebral bodies. Retropulsion of T11 and L3 results in severe central canal stenosis at L3 and multifactorial severe central canal stenosis was also noted at L4, in part related to metastatic disease. There was prominent soft tissue component involving the spinous process of L5 with resultant severe multifactorial canal stenosis at L4-5. A brain MRI was performed on 07/01/21 for disease staging and this was negative for any intracranial metastases.  An MRI of the lumbar spine was performed 07/01/21 for further evaluation and the findings were compatible with diffuse osseous  metastatic disease with fairly extensive soft tissue tumor replacement of L5 posterior elements as well as background diffuse abnormal T1 hypointense marrow signal suggesting chronic anemia. Additionally, there were presumed pathologic fractures at T11, T12, L2, L3, and L4. He underwent CT guided biopsy of the L5 soft tissue mass on 07/01/21, revealing metastatic carcinoma consistent with a prostatic primary.  He was started on Lupron in the hospital as well as biclutamide and plans to begin Zometa as an outpatient with Dr. Burr Medico on 07/27/21. There is also consideration for adding abiraterone 1000mg  daily and prednisone 5mg  in the next 2-4 weeks as his first line treatment in addition to Lupron. He was discharged to [facility] on 07/06/21 where he is currently residing for physical and occupational therapy.  He has been kindly referred to [place] for consideration of palliative radiation therapy to the painful osseous metastatic disease in the lumbar spine.  PREVIOUS RADIATION THERAPY: No  PAST MEDICAL HISTORY:  Past Medical History:  Diagnosis Date   Allergy    seasonal   Arthritis    r knee   H/O inguinal hernia repair    History of tobacco use    HIV infection ([place])    Hyperlipidemia    Hypertension    Strabismic amblyopia       PAST SURGICAL HISTORY: Past Surgical History:  Procedure Laterality Date   EYE SURGERY     strabismus   HERNIA REPAIR      FAMILY HISTORY:  Family History  Problem Relation Age of Onset  Colon polyps Mother    Heart disease Mother    Heart disease Father    Kidney disease Father    Lung cancer Father    Diabetes Maternal Grandmother    Colon cancer Neg Hx     SOCIAL HISTORY:  Social History   Socioeconomic History   Marital status: Single    Spouse name: Not on file   Number of children: Not on file   Years of education: Not on file   Highest education level: Not on file  Occupational History   Not on file  Tobacco Use   Smoking status:  Former   Smokeless tobacco: Former    Types: Chew    Quit date: 1998  Vaping Use   Vaping Use: Never used  Substance and Sexual Activity   Alcohol use: Yes    Alcohol/week: 1.0 standard drink    Types: 1 Cans of beer per week    Comment: occ   Drug use: No   Sexual activity: Never    Comment: pt. declined condoms  Other Topics Concern   Not on file  Social History Narrative   Not on file   Social Determinants of Health   Financial Resource Strain: Not on file  Food Insecurity: Not on file  Transportation Needs: Not on file  Physical Activity: Not on file  Stress: Not on file  Social Connections: Not on file  Intimate Partner Violence: Not on file    ALLERGIES: Patient has no known allergies.  MEDICATIONS:  Current Outpatient Medications  Medication Sig Dispense Refill   amLODipine (NORVASC) 2.5 MG tablet Take 2.5 mg by mouth daily.     atorvastatin (LIPITOR) 40 MG tablet TAKE 1 TABLET(40 MG) BY MOUTH DAILY (Patient taking differently: Take 40 mg by mouth daily.) 90 tablet 2   bicalutamide (CASODEX) 50 MG tablet Take 1 tablet (50 mg total) by mouth daily.     calcium citrate-vitamin D (CITRACAL+D) 315-200 MG-UNIT tablet Take 1 tablet by mouth daily.     feeding supplement (ENSURE ENLIVE / ENSURE PLUS) LIQD Take 237 mLs by mouth 3 (three) times daily between meals. 237 mL 60   gabapentin (NEURONTIN) 100 MG capsule Take by mouth.     lidocaine (LIDODERM) 5 % Place 1 patch onto the skin daily. Remove & Discard patch within 12 hours or as directed by MD 30 patch 0   Multiple Vitamins-Minerals (MULTIVITAMIN WITH MINERALS) tablet Take 1 tablet by mouth daily.     naproxen sodium (ALEVE) 220 MG tablet Take 220-440 mg by mouth See admin instructions. 440 mg in the morning 220 mg in the afternoon and at bedtime     ODEFSEY 200-25-25 MG TABS tablet TAKE 1 TABLET BY MOUTH EVERY DAY (Patient taking differently: 1 tablet every evening.) 90 tablet 2   No current facility-administered  medications for this encounter.    REVIEW OF SYSTEMS:  On review of systems, the patient reports that he is doing well overall. He denies any chest pain, shortness of breath, cough, fevers, chills, night sweats, unintended weight changes. He denies any bowel or bladder disturbances, and denies abdominal pain, nausea or vomiting. He reports some constipation from his pain medication. He also reports bilateral lower extremity paresthesias and weakness in his feet, right more than left. The tingling seems to move up his legs, from his feet, if he stands for any period of time or with walking. Otherwise, he denies any other focal sites of pain. A complete review of systems  is obtained and is otherwise negative.    PHYSICAL EXAM:  Wt Readings from Last 3 Encounters:  07/19/21 151 lb (68.5 kg)  07/06/21 138 lb 7.2 oz (62.8 kg)  06/30/21 145 lb 4.8 oz (65.9 kg)   Temp Readings from Last 3 Encounters:  07/06/21 98.2 F (36.8 C) (Oral)  06/30/21 98.1 F (36.7 C) (Oral)  04/27/21 99 F (37.2 C) (Oral)   BP Readings from Last 3 Encounters:  07/06/21 126/82  06/30/21 (!) 88/60  04/27/21 131/84   Pulse Readings from Last 3 Encounters:  07/06/21 100  06/30/21 84  04/27/21 (!) 105   Pain Assessment Pain Score: 1 /10  Physical exam not performed in light of telephone consult visit format.   KPS = 80  100 - Normal; no complaints; no evidence of disease. 90   - Able to carry on normal activity; minor signs or symptoms of disease. 80   - Normal activity with effort; some signs or symptoms of disease. 29   - Cares for self; unable to carry on normal activity or to do active work. 60   - Requires occasional assistance, but is able to care for most of his personal needs. 50   - Requires considerable assistance and frequent medical care. 44   - Disabled; requires special care and assistance. 78   - Severely disabled; hospital admission is indicated although death not imminent. 72   - Very  sick; hospital admission necessary; active supportive treatment necessary. 10   - Moribund; fatal processes progressing rapidly. 0     - Dead  Karnofsky DA, Abelmann [place], Craver LS and Burchenal [facility] [phone]) The use of the nitrogen mustards in the palliative treatment of carcinoma: with particular reference to bronchogenic carcinoma Cancer 1 [id number]  LABORATORY DATA:  Lab Results  Component Value Date   WBC 5.7 07/03/2021   HGB 9.3 (L) 07/03/2021   HCT 28.9 (L) 07/03/2021   MCV 90.3 07/03/2021   PLT 120 (L) 07/03/2021   Lab Results  Component Value Date   NA 135 07/02/2021   K 4.3 07/02/2021   CL 101 07/02/2021   CO2 26 07/02/2021   Lab Results  Component Value Date   ALT 20 07/01/2021   AST 45 (H) 07/01/2021   GGT 42 06/30/2021   ALKPHOS 593 (H) 07/01/2021   BILITOT 0.6 07/01/2021     RADIOGRAPHY: DG Lumbar Spine 2-3 Views  Result Date: 07/01/2021 CLINICAL DATA:  Low back pain and right leg weakness. EXAM: LUMBAR SPINE - 2-3 VIEW COMPARISON:  None. FINDINGS: There is diffusely decreased mineralization of the bones. There are compression deformities at T11, T12, and L4. T11 and T12 are not well assessed due to limitations of exam. There is loss of vertebral body height at L4 anteriorly of approximately 30%. Alignment is within normal limits. Mild multilevel intervertebral disc space narrowing, degenerative endplate changes and facet arthropathy is noted. There is atherosclerotic calcification and aneurysmal dilatation of the distal abdominal aorta measuring up to 3.1 cm in diameter. IMPRESSION: 1. New compression deformities at T11, T12, and L4, indeterminate in age. 2. Multilevel degenerative changes. 3. Aortic atherosclerosis with aneurysmal dilatation of the distal abdominal aorta measuring 3.1 cm. Electronically Signed   By: Brett Fairy M.D.   On: 07/01/2021 00:22   MR BRAIN WO CONTRAST  Result Date: 07/01/2021 CLINICAL DATA:  Metastatic disease evaluation. EXAM: MRI HEAD WITHOUT  CONTRAST TECHNIQUE: Multiplanar, multiecho pulse sequences of the brain and surrounding structures were obtained without intravenous  contrast. COMPARISON:  None. FINDINGS: Brain: There is no evidence of an acute infarct, intracranial hemorrhage, midline shift, or extra-axial fluid collection. Small T2 hyperintensities in the cerebral white matter bilaterally are nonspecific but compatible with minimal chronic small vessel ischemic disease. Mild cerebral atrophy is within normal limits for age. No mass is identified, however absence of IV contrast limits sensitivity for detection of small lesions. Vascular: Major intracranial vascular flow voids are preserved. Skull and upper cervical spine: Diffusely abnormal bone marrow signal throughout the skull and included upper cervical spine. Sinuses/Orbits: Unremarkable orbits. Trace bilateral mastoid fluid. Clear paranasal sinuses. Other: None. IMPRESSION: 1. No evidence of intracranial metastases on this noncontrast study. 2. Diffusely abnormal bone marrow signal consistent with known widespread osseous metastases. Electronically Signed   By: Logan Bores M.D.   On: 07/01/2021 10:02   MR Lumbar Spine Wo Contrast  Result Date: 07/01/2021 CLINICAL DATA:  Provided history: Neuropathy. Weakness of right hip. Lumbar radiculopathy, immunocompromised; acute weakness. Additional history provided: Low back pain for 4 months, pain radiates down both legs. EXAM: MRI LUMBAR SPINE WITHOUT CONTRAST TECHNIQUE: Multiplanar, multisequence MR imaging of the lumbar spine was performed. No intravenous contrast was administered. COMPARISON:  CT chest/abdomen/pelvis 07/01/2021. FINDINGS: Segmentation: 5 lumbar vertebrae. The caudal most well-formed intervertebral disc space is designated L5-S1. Alignment: Bony retropulsion at the T11, L3 and L4 levels. Most notably, bony retropulsion at the level of the L3 inferior endplate measures 5 mm. Vertebrae: There is extensive multifocal osseous  signal abnormality compatible with diffuse osseous metastatic disease. There is background diffuse abnormal T1 hypointense marrow signal, suggesting chronic anemia. There are multilevel presumed pathologic vertebral compression fractures as follows. T11 compression fracture (40% height loss). T12 compression fracture (less than 80% height loss). Superimposed Schmorl node within the T12 inferior endplate. L2 compression fracture (40-50% height loss posteriorly). L3 compression fracture (50% height loss). L4 superior endplate compression fracture (30-40% height loss). Also of note, there is fairly extensive soft tissue tumor replacing the posterior elements at L5, most notably within the spinous process and bilateral articular pillars/laminae. This soft tissue tumor appears to partly encroach upon the spinal canal at this level. Conus medullaris and cauda equina: Conus extends to the L1 level. No signal abnormality within the visualized distal spinal cord. Paraspinal and other soft tissues: Small right renal cysts. Paraspinal soft tissues unremarkable. Disc levels: Mild disc degeneration within the lumbar or visualized lower thoracic spine. 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 T10-T11: Imaged sagittally. Facet arthrosis/ligamentum flavum hypertrophy. 4 mm bony retropulsion at the level of the T11 superior endplate. Resultant moderate spinal canal stenosis. There is contact upon the ventral aspect of the spinal cord with mild spinal cord flattening (series 3, image 9). Apparent mild bilateral neural foraminal narrowing. T11-T12: Imaged sagittally. Facet arthrosis/ligamentum flavum hypertrophy. No significant disc herniation or stenosis. T12-L1: Slight disc bulge. Minimal facet arthrosis. No significant spinal canal or foraminal stenosis. L1-L2: Small disc bulge. Mild facet arthrosis/ligamentum flavum hypertrophy. Mild bilateral subarticular and central canal narrowing (without  appreciable nerve root impingement. Mild right Neural foraminal narrowing L2-L3: Minimal bony retropulsion. Disc bulge. Moderate facet arthrosis with ligamentum flavum hypertrophy. Epidural lipomatosis. Severe spinal canal stenosis with impingement of the descending cauda equina nerve roots. Bilateral neural foraminal narrowing (moderate right, severe left). L3-L4: Bony retropulsion, greatest at the level of the L3 inferior endplate. Advanced facet arthrosis with ligamentum flavum hypertrophy. Epidural lipomatosis. Severe spinal canal stenosis with impingement of the descending cauda equina nerve  roots at the L3 vertebral body level and L3-L4 disc level. Severe spinal canal stenosis is also present at the L4 vertebral body level with epidural lipomatosis contributing significantly. There is also severe spinal canal stenosis at the L4 vertebral body level with epidural lipomatosis contributing significantly at this level. Severe bilateral neural foraminal narrowing. L4-L5: Disc bulge. Advanced facet arthrosis with ligamentum flavum hypertrophy. Markedly severe spinal canal stenosis with impingement of the descending cauda equina nerve roots. Bilateral neural foraminal narrowing (moderate/severe right, severe left). L5-S1: Bony expansion, soft tissue tumor extension and epidural lipomatosis contribute to severe spinal canal stenosis with impingement of the descending cauda equina nerve roots at the L5 vertebral body level. At the L5-S1 disc level, there is a disc bulge with endplate spurring. Superimposed shallow broad-based center/left subarticular disc protrusion at site of posterior annular fissure. Mild facet arthrosis. Minimal ligamentum flavum hypertrophy. The disc protrusion contributes to mild left subarticular narrowing, contacting and with possible contact upon the descending left S1 nerve root. No significant foraminal stenosis. IMPRESSION: Findings compatible with diffuse osseous metastatic disease. There  is fairly extensive soft tissue tumor replacement of the L5 posterior elements, most notably involving the spinous process and bilateral articular pillars/laminae. Background diffuse abnormal T1 hypointense marrow signal suggesting chronic anemia. Presumed pathologic compression fractures at T11, T12, L2, L3 and L4. Multilevel bony retropulsion, greatest at the level of the L3 inferior endplate (measuring 5 mm at this site). Lumbar spondylosis, epidural lipomatosis and congenitally narrow lumbar spinal canal. Multilevel multifactorial spinal canal stenosis, as detailed. Most notably, there is multifactorial severe spinal canal stenosis with cauda equina nerve root impingement at the L2-L3 disc level, L3 vertebral body level, L3-L4 disc level, L4 vertebral body level, L4-L5 disc level and L5 vertebral body level. Notably, soft tissue tumor encroachment contributes to severe spinal canal stenosis at the L5 level. Also of note, bony retropulsion contributes to moderate spinal canal stenosis at T10-T11. There is contact upon the ventral spinal cord (with mild spinal cord flattening) at this level. Multilevel foraminal stenosis, as detailed and greatest on the left at L2-L3 (severe), bilaterally at L3-L4 (severe) and bilaterally at L4-L5 (moderate/severe right, severe left). Electronically Signed   By: Kellie Simmering D.O.   On: 07/01/2021 16:35   CT CHEST ABDOMEN PELVIS W CONTRAST  Result Date: 07/01/2021 CLINICAL DATA:  Unintentional weight loss, elevated serum ferritin, fatigue EXAM: CT CHEST, ABDOMEN, AND PELVIS WITH CONTRAST TECHNIQUE: Multidetector CT imaging of the chest, abdomen and pelvis was performed following the standard protocol during bolus administration of intravenous contrast. CONTRAST:  143mL OMNIPAQUE IOHEXOL 300 MG/ML  SOLN COMPARISON:  None. FINDINGS: CT CHEST FINDINGS Cardiovascular: Mild coronary artery calcification. Global cardiac size within normal limits. No pericardial effusion. Central  pulmonary arteries are of normal caliber. The thoracic aorta is dilated measuring 4.9 cm in its ascending segment and 3.2 cm in its proximal descending segment. Distally, the descending thoracic aorta is of normal caliber. Minimal atherosclerotic calcification. Mediastinum/Nodes: No enlarged mediastinal, hilar, or axillary lymph nodes. Thyroid gland, trachea, and esophagus demonstrate no significant findings. Lungs/Pleura: Mild right basilar atelectasis. Lungs are otherwise clear. No pneumothorax or pleural effusion. Central airways are widely patent. Musculoskeletal: Pathologic fracture of the T11 vertebral body is seen with approximately 50% loss of height and retropulsion of the posterosuperior aspect of the vertebral body by approximately 3 mm. No significant resultant canal stenosis. The posterior elements appear intact. A a pathologic fracture of the inferior endplate of V89 is also identified with minimal  loss of height. Sclerotic metastases are seen scattered throughout the thoracic spine, best noted within the T5 vertebral body. There are numerous mixed lytic and sclerotic metastases within the ribs bilaterally with pathologic fracture the left third, fourth and right seventh ribs. CT ABDOMEN PELVIS FINDINGS Hepatobiliary: No focal liver abnormality is seen. No gallstones, gallbladder wall thickening, or biliary dilatation. Pancreas: Unremarkable Spleen: Unremarkable Adrenals/Urinary Tract: The adrenal glands are unremarkable. The kidneys are normal in size and position. Tiny cortical hypodensity within the interpolar region of the left kidney is not well characterized on this examination but likely represents a tiny cortical cyst. Multiple nonobstructing calculi are seen within the right kidney measuring up to 3 mm. No ureteral calculi. No hydronephrosis. The bladder is unremarkable. Stomach/Bowel: Stomach is within normal limits. Appendix appears normal. No evidence of bowel wall thickening, distention,  or inflammatory changes. Vascular/Lymphatic: Moderate aortoiliac atherosclerotic calcification. No aortic aneurysm. No pathologic adenopathy within the abdomen and pelvis. Reproductive: The prostate gland is moderately enlarged. Other: No abdominal wall hernia.  Rectum unremarkable. Musculoskeletal: There are innumerable mixed lytic and sclerotic metastases noted throughout the lumbar spine and pelvis with pathologic fracture of the L2, L3, and L4 vertebral bodies. There is retropulsion of the posteroinferior aspect of the L3 vertebral body by approximately 8 mm which results in marked central canal stenosis. While many of the osseous metastases do not demonstrate significant soft tissue component, there is extensive involvement of the spinous process of L5 which demonstrates a notable soft tissue component bilaterally best seen on axial image # 86/3. There is severe multifactorial canal stenosis at L4-5 noted. 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 Numerous pathologic fractures involving the ribs bilaterally as well as the T11, T12, L2, L3, and L4 vertebral bodies. Retropulsion of T11 and L3 results in severe central canal stenosis at L3. Multifactorial severe central canal stenosis also noted at L4, in part related to metastatic disease. Prominent soft tissue component involving the metastasis involving the spinous process of L5 may provide a a appropriate target for tissue sampling for further evaluation. Mild coronary artery calcification. Thoracic aortic aneurysm with maximal transaxial dimension of 4.9 cm. Ascending thoracic aortic aneurysm. Recommend semi-annual imaging followup by CTA or MRA and referral to cardiothoracic surgery if not already obtained. This recommendation follows 2010 ACCF/AHA/AATS/ACR/ASA/SCA/SCAI/SIR/STS/SVM Guidelines for the Diagnosis and Management of Patients With Thoracic Aortic Disease. Circulation. 2010; 121: I696-E952. Aortic aneurysm NOS  (ICD10-I71.9) Moderate enlargement of the prostate gland. Correlation with serum PSA may be helpful. Electronically Signed   By: Fidela Salisbury M.D.   On: 07/01/2021 00:52   CT BIOPSY  Result Date: 07/01/2021 INDICATION: Metastatic disease of unknown primary.  Lumbar paraspinous mass. EXAM: CT-GUIDED BIOPSY OF L5 PARASPINOUS MASS COMPARISON:  CT CAP, 07/01/2021.  MR L-spine, same day. MEDICATIONS: None. ANESTHESIA/SEDATION: Fentanyl 50 mcg IV; Versed 1 mg IV Sedation time: 27 minutes; The patient was continuously monitored during the procedure by the interventional radiology nurse under my direct supervision. CONTRAST:  None. COMPLICATIONS: None immediate. PROCEDURE: Informed consent was obtained from the patient following an explanation of the procedure, risks, benefits and alternatives. A time out was performed prior to the initiation of the procedure. The patient was positioned prone on the CT table and a limited CT was performed for procedural planning demonstrating L5 paraspinous mass. The procedure was planned. The operative site was prepped and draped in the usual sterile fashion. Appropriate trajectory was confirmed with a 22 gauge spinal needle after the adjacent tissues were  anesthetized with 1% Lidocaine with epinephrine. Under intermittent CT guidance, a 17 gauge coaxial needle was advanced into the peripheral aspect of the mass. Appropriate positioning was confirmed and 3 samples were obtained with an 18 gauge core needle biopsy device. The co-axial needle was removed and hemostasis was achieved with manual compression. A limited postprocedural CT was negative for hemorrhage or additional complication. A dressing was placed. The patient tolerated the procedure well without immediate postprocedural complication. IMPRESSION: Successful CT-guided core needle biopsy of L5 paraspinous mass, as above. Michaelle Birks, MD [facility] Electronically  Signed   By: Michaelle Birks M.D.   On: 07/01/2021 16:23   ECHOCARDIOGRAM COMPLETE  Result Date: 07/01/2021    ECHOCARDIOGRAM REPORT   Patient Name:   Patrick [name] Date of Exam: 07/01/2021 Medical Rec #:  [id number]         Height:       70.0 in Accession #:    [id number]        Weight:       145.3 lb Date of Birth:  12/18/1950        BSA:          1.822 m Patient Age:    67 years          BP:           102/68 mmHg Patient Gender: M                 HR:           87 bpm. Exam Location:  Inpatient Procedure: 2D Echo Indications:    abnormal ecg  History:        Patient has no prior history of Echocardiogram examinations.                 HIV, Signs/Symptoms:back pain; Risk Factors:Hypertension,                 Dyslipidemia and Former Smoker.  Sonographer:    Johny Chess RDCS Referring Phys: [id number] [place]  1. Mild mid-apical anterior hypokinesis. Left ventricular ejection fraction, by estimation, is 60 to 65%. The left ventricle has normal function. The left ventricle demonstrates regional wall motion abnormalities (see scoring diagram/findings for description). There is mild left ventricular hypertrophy of the septal segment. Left ventricular diastolic parameters are consistent with Grade I diastolic dysfunction (impaired relaxation).  2. Right ventricular systolic function is normal. The right ventricular size is normal. There is mildly elevated pulmonary artery systolic pressure.  3. Left atrial size was mildly dilated.  4. The mitral valve is normal in structure. Trivial mitral valve regurgitation. No evidence of mitral stenosis.  5. The aortic valve is tricuspid. Aortic valve regurgitation is severe. No aortic stenosis is present. Aortic regurgitation PHT measures 278 msec.  6. Aortic dilatation noted. 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  7. The inferior vena cava is normal in size with greater than 50% respiratory variability, suggesting right  atrial pressure of 3 mmHg. FINDINGS  Left Ventricle: Mild mid-apical anterior hypokinesis. Left ventricular ejection fraction, by estimation, is 60 to 65%. The left ventricle has normal function. The left ventricle demonstrates regional wall motion abnormalities. The left ventricular internal cavity size was normal in size. There is mild left ventricular hypertrophy of the septal segment. Left ventricular diastolic parameters are consistent with Grade I diastolic dysfunction (impaired relaxation). Right Ventricle: The right ventricular size is normal. No  increase in right ventricular wall thickness. Right ventricular systolic function is normal. There is mildly elevated pulmonary artery systolic pressure. The tricuspid regurgitant velocity is 2.95  m/s, and with an assumed right atrial pressure of 3 mmHg, the estimated right ventricular systolic pressure is 88.4 mmHg. Left Atrium: Left atrial size was mildly dilated. Right Atrium: Right atrial size was normal in size. Pericardium: There is no evidence of pericardial effusion. Mitral Valve: The mitral valve is normal in structure. Trivial mitral valve regurgitation. No evidence of mitral valve stenosis. Tricuspid Valve: The tricuspid valve is normal in structure. Tricuspid valve regurgitation is trivial. No evidence of tricuspid stenosis. Aortic Valve: The aortic valve is tricuspid. Aortic valve regurgitation is severe. Aortic regurgitation PHT measures 278 msec. No aortic stenosis is present. Pulmonic Valve: The pulmonic valve was normal in structure. Pulmonic valve regurgitation is not visualized. No evidence of pulmonic stenosis. Aorta: Aortic dilatation noted. 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 Venous: The inferior vena cava is normal in size with greater than 50% respiratory variability, suggesting right atrial pressure of 3 mmHg. IAS/Shunts: No atrial level shunt detected by color flow Doppler.  LEFT VENTRICLE PLAX 2D LVIDd:          5.60 cm   Diastology LVIDs:         3.10 cm   LV e' medial:  7.43 cm/s LV PW:         1.00 cm   LV e' lateral: 11.10 cm/s LV IVS:        1.20 cm LVOT diam:     2.30 cm LV SV:         101 LV SV Index:   56 LVOT Area:     4.15 cm  RIGHT VENTRICLE RV S prime:     12.40 cm/s TAPSE (M-mode): 2.1 cm LEFT ATRIUM             Index        RIGHT ATRIUM           Index LA diam:        3.60 cm 1.98 cm/m   RA Area:     15.00 cm LA Vol (A2C):   59.8 ml 32.82 ml/m  RA Volume:   32.60 ml  17.89 ml/m LA Vol (A4C):   58.6 ml 32.16 ml/m LA Biplane Vol: 60.5 ml 33.20 ml/m  AORTIC VALVE LVOT Vmax:   153.00 cm/s LVOT Vmean:  95.300 cm/s LVOT VTI:    0.244 m AI PHT:      278 msec  AORTA Ao Root diam: 3.60 cm Ao Asc diam:  4.90 cm TRICUSPID VALVE TR Peak grad:   34.8 mmHg TR Vmax:        295.00 cm/s  SHUNTS Systemic VTI:  0.24 m Systemic Diam: 2.30 cm Skeet Latch MD Electronically signed by Skeet Latch MD Signature Date/Time: 07/01/2021/4:07:42 PM    Final       IMPRESSION/PLAN: This visit was conducted via telephone to spare the patient unnecessary potential exposure in the healthcare setting during the current COVID-19 pandemic. 1. 71 y.o. 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 Today, we talked to the patient about the findings and workup thus far. We discussed the natural history of metastatic prostate cancer and general treatment, highlighting the role of radiotherapy in the management of painful osseous metastases. In his case, our recommendation is for a 2 week course of daily, palliative radiation  to the painful disease at L5. We discussed the available radiation techniques, and focused on the details and logistics of delivery. The goal is to shrink the tumor at L5 to alleviate his back pain and and preserve neurologic function but he understands that it may take up to 2-3 weeks to appreciate the effects of the radiation. We reviewed the anticipated  acute and late sequelae associated with radiation in this setting. The patient was encouraged to ask questions that were answered to his stated satisfaction.  At the end of our conversation, the patient would like to proceed with the recommended 2 week course of daily palliative radiation to L5. We will share our discussion with Dr. Burr Medico and coordinate transportation with [facility] facility to get him over to the cancer center for CT Simulation/treatment planning on Wednesday, 07/20/21, in anticipation of beginning his daily treatments on Thursday 07/21/21. We enjoyed meeting him today and will look forward to continuing to participate in his care.  Given current concerns for patient exposure during the COVID-19 pandemic, this encounter was conducted via telephone. The patient was notified in advance and was offered a [place] meeting to allow for face to face communication but unfortunately reported that he did not have the appropriate resources/technology to support such a visit and instead preferred to proceed with telephone consult. The patient has given verbal consent for this type of encounter. The attendants for this meeting include Tyler Pita MD, Ashlyn Bruning PA-C, [place], patient, Patrick [name]. During the encounter, Tyler Pita MD, Ashlyn Bruning PA-C, and scribe, Wilburn Mylar were located at [place].  Patient, Patrick [name] was located at home.  We personally spent 70 minutes in this encounter including chart review, reviewing radiological studies, meeting face-to-face with the patient, entering orders and completing documentation.    Nicholos Johns, PA-C    Tyler Pita, MD  [facility] Direct Dial: [phone]   Fax: [phone] .com   Skype   [email or web address]   This document serves as a record of services personally performed by Tyler Pita, MD and  Freeman Caldron, PA-C. It was created on their behalf by Wilburn Mylar, a trained medical scribe. The creation of this record is based on the scribe's personal observations and the provider's statements to them. This document has been checked and approved by the attending provider.

## 2021-07-18 NOTE — Progress Notes (Signed)
Histology and Location of Primary Cancer: Metastatic Prostate Carcinoma  Sites of Visceral and Bony Metastatic Disease: L5 Paraspinal mass   Location(s) of Symptomatic Metastases: Thoracic & Lumber Spine  Past/Anticipated chemotherapy by medical oncology, if any:   -PSA >3000.  Patient had a biopsy of the L5 paraspinal mass which resulted today.  Results consistent with metastatic prostate carcinoma.  Discussed the biopsy results with the patient today.  We again reviewed the incurable nature of his malignancy and treatment options. -We will begin Lupron today.  In the hospital, we will give Lupron 7.5 mg IM x1 dose.  We can look into transitioning him to the daily 3 to 42-month dosing as an outpatient. -He has no other visceral metastasis, I recommend abiraterone 1000mg  daily and prednisone 5mg  as his first line treatment in addition to Lupron.  -I also recommend Zometa 4 mg infusion once next week, for his diffuse bone metastasis, to reduce his future risk of fractures.   Pain on a scale of 0-10 is:  1/10  IPSS:  2 SHIM:  1   If Spine Met(s), symptoms, if any, include: Bowel/Bladder retention or incontinence (please describe): Constipation advised to take Miralax.  Bladder no issues.  No nausea or vomiting.  Numbness or weakness in extremities (please describe): Patient has bilateral lower extremity paresthesia and weakness (more on R).  Current Decadron regimen, if applicable: No  Ambulatory status? Walker? Wheelchair?:  Rolling walker (2 wheels) doesn't; have personal one is borrowing from SNF.  SAFETY ISSUES: Prior radiation?  No Pacemaker/ICD? No Possible current pregnancy? Male Is the patient on methotrexate? No  Current Complaints / other details:  Understands illness and has some minor concerns about side effects not sure if swelling of feet is due to recent diagnosis or past health issues.

## 2021-07-19 ENCOUNTER — Ambulatory Visit: Admission: RE | Admit: 2021-07-19 | Payer: PPO | Source: Ambulatory Visit

## 2021-07-19 ENCOUNTER — Ambulatory Visit
Admission: RE | Admit: 2021-07-19 | Discharge: 2021-07-19 | Disposition: A | Discharge status: Home / Self Care | Payer: PPO | Source: Ambulatory Visit | Attending: Radiation Oncology | Admitting: Radiation Oncology

## 2021-07-19 ENCOUNTER — Other Ambulatory Visit: Payer: Self-pay

## 2021-07-19 VITALS — Ht 70.0 in | Wt 151.0 lb

## 2021-07-19 DIAGNOSIS — G834 Cauda equina syndrome: Secondary | ICD-10-CM | POA: Diagnosis not present

## 2021-07-19 DIAGNOSIS — C61 Malignant neoplasm of prostate: Secondary | ICD-10-CM | POA: Diagnosis not present

## 2021-07-19 DIAGNOSIS — E876 Hypokalemia: Secondary | ICD-10-CM | POA: Diagnosis not present

## 2021-07-19 DIAGNOSIS — C7951 Secondary malignant neoplasm of bone: Secondary | ICD-10-CM | POA: Diagnosis not present

## 2021-07-19 NOTE — Progress Notes (Signed)
Introduced myself to the patient as the prostate nurse navigator.  He verbalized possible barriers with transportation as he is in a skilled nursing facility.  He had a telephone visit to discuss his radiation treatment options.  RN confirmed that [facility] has transportation coordinated for apt on 07/20/2021 and patient aware.   I gave him my contact number and asked him to call me with questions or concerns. Verbalized understanding.

## 2021-07-20 ENCOUNTER — Ambulatory Visit
Admission: RE | Admit: 2021-07-20 | Discharge: 2021-07-20 | Disposition: A | Discharge status: Home / Self Care | Payer: PPO | Source: Ambulatory Visit | Attending: Radiation Oncology | Admitting: Radiation Oncology

## 2021-07-20 DIAGNOSIS — C7951 Secondary malignant neoplasm of bone: Secondary | ICD-10-CM | POA: Diagnosis not present

## 2021-07-20 DIAGNOSIS — Z510 Encounter for antineoplastic radiation therapy: Secondary | ICD-10-CM | POA: Insufficient documentation

## 2021-07-20 DIAGNOSIS — C61 Malignant neoplasm of prostate: Secondary | ICD-10-CM | POA: Diagnosis not present

## 2021-07-20 NOTE — Progress Notes (Signed)
°  Radiation Oncology         [phone] ________________________________  Name: Patrick [name] MRN: [id number]  Date: 07/20/2021  DOB: Mar 28, 1951  SIMULATION AND TREATMENT PLANNING NOTE    ICD-10-CM   1. Prostate cancer metastatic to bone [facility])  C61    C79.51       DIAGNOSIS:  71 y.o. patient with lumbar spinal metastasis  NARRATIVE:  The patient was brought to the [facility].  Identity was confirmed.  All relevant records and images related to the planned course of therapy were reviewed.  The patient freely provided informed written consent to proceed with treatment after reviewing the details related to the planned course of therapy. The consent form was witnessed and verified by the simulation staff.  Then, the patient was set-up in a stable reproducible  supine position for radiation therapy.  CT images were obtained.  Surface markings were placed.  The CT images were loaded into the planning software.  Then the target and avoidance structures were contoured including kidneys.  Treatment planning then occurred.  The radiation prescription was entered and confirmed.  Then, I designed and supervised the construction of a total of 3 medically necessary complex treatment devices with VacLoc positioner and 2 MLCs to shield kidneys.  I have requested : 3D Simulation  I have requested a DVH of the following structures: Left Kidney, Right Kidney and target.  PLAN:  The patient will receive 30 Gy in 10 fractions from T10 down to the SI joints.  ________________________________  Sheral Apley. Tammi Klippel, M.D.

## 2021-07-21 ENCOUNTER — Other Ambulatory Visit: Payer: Self-pay

## 2021-07-21 ENCOUNTER — Ambulatory Visit
Admission: RE | Admit: 2021-07-21 | Discharge: 2021-07-21 | Disposition: A | Discharge status: Home / Self Care | Payer: PPO | Source: Ambulatory Visit | Attending: Radiation Oncology | Admitting: Radiation Oncology

## 2021-07-21 DIAGNOSIS — C7951 Secondary malignant neoplasm of bone: Secondary | ICD-10-CM

## 2021-07-21 DIAGNOSIS — C61 Malignant neoplasm of prostate: Secondary | ICD-10-CM

## 2021-07-21 DIAGNOSIS — Z510 Encounter for antineoplastic radiation therapy: Secondary | ICD-10-CM | POA: Diagnosis not present

## 2021-07-22 ENCOUNTER — Other Ambulatory Visit: Payer: Self-pay

## 2021-07-22 ENCOUNTER — Ambulatory Visit
Admission: RE | Admit: 2021-07-22 | Discharge: 2021-07-22 | Disposition: A | Discharge status: Home / Self Care | Payer: PPO | Source: Ambulatory Visit | Attending: Radiation Oncology | Admitting: Radiation Oncology

## 2021-07-22 DIAGNOSIS — Z510 Encounter for antineoplastic radiation therapy: Secondary | ICD-10-CM | POA: Diagnosis not present

## 2021-07-22 DIAGNOSIS — C7951 Secondary malignant neoplasm of bone: Secondary | ICD-10-CM | POA: Diagnosis not present

## 2021-07-22 DIAGNOSIS — C61 Malignant neoplasm of prostate: Secondary | ICD-10-CM | POA: Diagnosis not present

## 2021-07-25 ENCOUNTER — Other Ambulatory Visit: Payer: Self-pay | Admitting: Infectious Diseases

## 2021-07-25 ENCOUNTER — Inpatient Hospital Stay: Payer: PPO | Admitting: Hematology

## 2021-07-25 ENCOUNTER — Ambulatory Visit
Admission: RE | Admit: 2021-07-25 | Discharge: 2021-07-25 | Disposition: A | Discharge status: Home / Self Care | Payer: PPO | Source: Ambulatory Visit | Attending: Radiation Oncology | Admitting: Radiation Oncology

## 2021-07-25 ENCOUNTER — Inpatient Hospital Stay: Payer: PPO

## 2021-07-25 DIAGNOSIS — B20 Human immunodeficiency virus [HIV] disease: Secondary | ICD-10-CM

## 2021-07-25 DIAGNOSIS — Z510 Encounter for antineoplastic radiation therapy: Secondary | ICD-10-CM | POA: Diagnosis not present

## 2021-07-25 DIAGNOSIS — C61 Malignant neoplasm of prostate: Secondary | ICD-10-CM | POA: Diagnosis not present

## 2021-07-25 DIAGNOSIS — C7951 Secondary malignant neoplasm of bone: Secondary | ICD-10-CM | POA: Diagnosis not present

## 2021-07-26 ENCOUNTER — Telehealth: Payer: Self-pay | Admitting: Hematology

## 2021-07-26 ENCOUNTER — Ambulatory Visit
Admission: RE | Admit: 2021-07-26 | Discharge: 2021-07-26 | Disposition: A | Discharge status: Home / Self Care | Payer: PPO | Source: Ambulatory Visit | Attending: Radiation Oncology | Admitting: Radiation Oncology

## 2021-07-26 DIAGNOSIS — C7951 Secondary malignant neoplasm of bone: Secondary | ICD-10-CM | POA: Diagnosis not present

## 2021-07-26 DIAGNOSIS — C61 Malignant neoplasm of prostate: Secondary | ICD-10-CM | POA: Diagnosis not present

## 2021-07-26 DIAGNOSIS — Z510 Encounter for antineoplastic radiation therapy: Secondary | ICD-10-CM | POA: Diagnosis not present

## 2021-07-26 NOTE — Telephone Encounter (Signed)
Sch per 1/31 inbasket,lef tmsg °

## 2021-07-27 ENCOUNTER — Inpatient Hospital Stay: Payer: PPO | Admitting: Hematology

## 2021-07-27 ENCOUNTER — Ambulatory Visit
Admission: RE | Admit: 2021-07-27 | Discharge: 2021-07-27 | Disposition: A | Discharge status: Home / Self Care | Payer: PPO | Source: Ambulatory Visit | Attending: Radiation Oncology | Admitting: Radiation Oncology

## 2021-07-27 ENCOUNTER — Other Ambulatory Visit: Payer: Self-pay | Admitting: Hematology

## 2021-07-27 ENCOUNTER — Other Ambulatory Visit: Payer: Self-pay

## 2021-07-27 ENCOUNTER — Ambulatory Visit (INDEPENDENT_AMBULATORY_CARE_PROVIDER_SITE_OTHER): Payer: PPO | Admitting: Internal Medicine

## 2021-07-27 ENCOUNTER — Inpatient Hospital Stay: Payer: PPO

## 2021-07-27 DIAGNOSIS — C7951 Secondary malignant neoplasm of bone: Secondary | ICD-10-CM | POA: Diagnosis not present

## 2021-07-27 DIAGNOSIS — N179 Acute kidney failure, unspecified: Secondary | ICD-10-CM | POA: Diagnosis not present

## 2021-07-27 DIAGNOSIS — E440 Moderate protein-calorie malnutrition: Secondary | ICD-10-CM

## 2021-07-27 DIAGNOSIS — C61 Malignant neoplasm of prostate: Secondary | ICD-10-CM

## 2021-07-27 DIAGNOSIS — Z510 Encounter for antineoplastic radiation therapy: Secondary | ICD-10-CM | POA: Insufficient documentation

## 2021-07-27 DIAGNOSIS — B20 Human immunodeficiency virus [HIV] disease: Secondary | ICD-10-CM | POA: Diagnosis not present

## 2021-07-27 DIAGNOSIS — E876 Hypokalemia: Secondary | ICD-10-CM | POA: Diagnosis not present

## 2021-07-27 DIAGNOSIS — U071 COVID-19: Secondary | ICD-10-CM | POA: Insufficient documentation

## 2021-07-27 NOTE — Progress Notes (Signed)
°  [facility] Telephone Encounter Continuity Care Appointment  HPI:  This telephone encounter was created for [name] on 07/27/2021 for the following purpose/cc hospital follow up. Patrick [name] states that he has been doing well since his hospital discharge.  He has been at [facility] for rehab since his hospitalization and notes that he is currently strength back up and doing well with this.  During his hospitalization, the patient was diagnosed with metastatic prostate cancer that has been currently receiving treatment with oncology and radiation oncology.  He states that this has been going well and he has been tolerating the treatments.  His appetite remains stable at this time.  His back pain is improved, especially with the addition of Norco prior to radiation treatments.  Of note, the patient states that there was a COVID outbreak at [facility].  He was diagnosed with COVID and has had a mild cough and congestion, but overall is feeling well. He denies any fevers, chills, diaphoresis, chest pain, SOB, abd pain, n/v/d, or any urinary symptoms at this time.    Past Medical History:  Past Medical History:  Diagnosis Date   Allergy    seasonal   Arthritis    r knee   H/O inguinal hernia repair    History of tobacco use    HIV infection ([place])    Hyperlipidemia    Hypertension    Strabismic amblyopia      ROS:  Review of Systems  Constitutional:  Negative for chills and fever.  HENT:  Positive for congestion. Negative for sore throat.   Respiratory:  Positive for cough. Negative for shortness of breath.   Cardiovascular:  Negative for chest pain and palpitations.  Gastrointestinal:  Negative for nausea and vomiting.  Musculoskeletal:  Positive for back pain.  Neurological:  Positive for weakness. Negative for dizziness, sensory change and headaches.    Assessment / Plan / Recommendations:  Please see A&P under problem oriented charting  for assessment of the patient's acute and chronic medical conditions.  As always, pt is advised that if symptoms worsen or new symptoms arise, they should go to an urgent care facility or to to ER for further evaluation.   Consent and Medical Decision Making:  Patient discussed with Dr. Evette Doffing This is a telephone encounter between Patrick [name] and Patrick [name] on 07/27/2021 for a hospital follow up. The visit was conducted with the patient located at  [facility])  and Dorethea Clan at [facility]. The patient's identity was confirmed using their DOB and current address. The patient has consented to being evaluated through a telephone encounter and understands the associated risks (an examination cannot be done and the patient may need to come in for an appointment) / benefits (allows the patient to remain at home, decreasing exposure to coronavirus). I personally spent 16 minutes on medical discussion.

## 2021-07-27 NOTE — Assessment & Plan Note (Signed)
Patient was diagnosed with metastatic prostate cancer after having ~40 lb unintentional weight loss from November 2022 to January 2023. He notes that since his hospital discharge, his appetite has been good and he has been eating/drinking well. He has been getting Boost/Ensure supplements at his SNF, although he only gets about 1 per day. During his hospitalization, the dietitian was consulted and recommended Ensure Enlive po TID.

## 2021-07-27 NOTE — Assessment & Plan Note (Signed)
Patient recently hospitalized and diagnosed with metastatic prostate cancer. MRI lumber spine with pathologic compression fractures at T11, T12, L2, L3 and L4.  Also noted multifactorial severe spinal canal stenosis with cauda equina nerve root impingement at the L2-L3 disc level, L3 vertebral body level, L3-L4 disc level, L4 vertebral body level, L4-L5 disc level and L5 vertebral body level. Patient had successful CT-guided biopsy of L5 paraspinous mass with surgical pathology consistent with metastatic prostate cancer. Since his hospital discharge, he was started on Casodex and lupron through medical oncology, and was also started on palliative radiation treatments. He has received 5/10 radiation treatments and has been tolerating this well. The patient is currently at [facility] for rehabilitation since his hospitalization and he notes that this is going well and he is getting his strength back. His back pain is well controlled and he is eager to be discharged from the SNF to home.   Plan: - Continue to follow with Dr. Burr Medico (oncology) - Radiation oncology follow up: 5 more radiation treatments - Continue Lupron and Casodex as prescribed

## 2021-07-27 NOTE — Assessment & Plan Note (Signed)
Patient notes that there has been a COVID outbreak at [facility] (the SNF he has been residing at since hospital discharge). He was diagnosed with COVID and has been dealing with a mild cough and congestion for the past few days. He denied any fevers, chills, sore throat, shortness of breath, myalgias, or any other sxs at this time. Offered treatment with Paxlovid to the patient, as he is at high risk given his immunocompromised state with cancer and HIV, however, he declined.

## 2021-07-28 ENCOUNTER — Other Ambulatory Visit: Payer: Self-pay

## 2021-07-28 ENCOUNTER — Ambulatory Visit
Admission: RE | Admit: 2021-07-28 | Discharge: 2021-07-28 | Disposition: A | Discharge status: Home / Self Care | Payer: PPO | Source: Ambulatory Visit | Attending: Radiation Oncology | Admitting: Radiation Oncology

## 2021-07-28 DIAGNOSIS — C7951 Secondary malignant neoplasm of bone: Secondary | ICD-10-CM | POA: Diagnosis not present

## 2021-07-28 DIAGNOSIS — C61 Malignant neoplasm of prostate: Secondary | ICD-10-CM | POA: Diagnosis not present

## 2021-07-28 DIAGNOSIS — R195 Other fecal abnormalities: Secondary | ICD-10-CM | POA: Diagnosis not present

## 2021-07-28 DIAGNOSIS — U071 COVID-19: Secondary | ICD-10-CM | POA: Diagnosis not present

## 2021-07-28 DIAGNOSIS — Z510 Encounter for antineoplastic radiation therapy: Secondary | ICD-10-CM | POA: Diagnosis not present

## 2021-07-28 DIAGNOSIS — D649 Anemia, unspecified: Secondary | ICD-10-CM | POA: Diagnosis not present

## 2021-07-28 NOTE — Progress Notes (Signed)
Internal Medicine Clinic Attending  Case discussed with Dr. Raymondo Band  At the time of the visit.  We reviewed the residents history and pertinent patient test results.  I agree with the assessment, diagnosis, and plan of care documented in the residents note.

## 2021-07-28 NOTE — Addendum Note (Signed)
Addended by: Lalla Brothers T on: 07/28/2021 09:18 AM   Modules accepted: Level of Service

## 2021-07-29 ENCOUNTER — Ambulatory Visit
Admission: RE | Admit: 2021-07-29 | Discharge: 2021-07-29 | Disposition: A | Discharge status: Home / Self Care | Payer: PPO | Source: Ambulatory Visit | Attending: Radiation Oncology | Admitting: Radiation Oncology

## 2021-07-29 DIAGNOSIS — U071 COVID-19: Secondary | ICD-10-CM | POA: Diagnosis not present

## 2021-07-29 DIAGNOSIS — C61 Malignant neoplasm of prostate: Secondary | ICD-10-CM | POA: Diagnosis not present

## 2021-07-29 DIAGNOSIS — C7951 Secondary malignant neoplasm of bone: Secondary | ICD-10-CM | POA: Diagnosis not present

## 2021-07-29 DIAGNOSIS — Z510 Encounter for antineoplastic radiation therapy: Secondary | ICD-10-CM | POA: Diagnosis not present

## 2021-07-29 DIAGNOSIS — R609 Edema, unspecified: Secondary | ICD-10-CM | POA: Diagnosis not present

## 2021-08-01 ENCOUNTER — Ambulatory Visit
Admission: RE | Admit: 2021-08-01 | Discharge: 2021-08-01 | Disposition: A | Discharge status: Home / Self Care | Payer: PPO | Source: Ambulatory Visit | Attending: Radiation Oncology | Admitting: Radiation Oncology

## 2021-08-01 DIAGNOSIS — C61 Malignant neoplasm of prostate: Secondary | ICD-10-CM | POA: Diagnosis not present

## 2021-08-01 DIAGNOSIS — Z510 Encounter for antineoplastic radiation therapy: Secondary | ICD-10-CM | POA: Diagnosis not present

## 2021-08-01 DIAGNOSIS — C7951 Secondary malignant neoplasm of bone: Secondary | ICD-10-CM | POA: Diagnosis not present

## 2021-08-02 ENCOUNTER — Ambulatory Visit
Admission: RE | Admit: 2021-08-02 | Discharge: 2021-08-02 | Disposition: A | Discharge status: Home / Self Care | Payer: PPO | Source: Ambulatory Visit | Attending: Radiation Oncology | Admitting: Radiation Oncology

## 2021-08-02 ENCOUNTER — Other Ambulatory Visit: Payer: Self-pay

## 2021-08-02 DIAGNOSIS — C61 Malignant neoplasm of prostate: Secondary | ICD-10-CM | POA: Diagnosis not present

## 2021-08-02 DIAGNOSIS — C7951 Secondary malignant neoplasm of bone: Secondary | ICD-10-CM | POA: Diagnosis not present

## 2021-08-02 DIAGNOSIS — Z510 Encounter for antineoplastic radiation therapy: Secondary | ICD-10-CM | POA: Diagnosis not present

## 2021-08-03 ENCOUNTER — Inpatient Hospital Stay: Payer: PPO

## 2021-08-03 ENCOUNTER — Other Ambulatory Visit: Payer: Self-pay

## 2021-08-03 ENCOUNTER — Encounter: Payer: Self-pay | Admitting: Hematology

## 2021-08-03 ENCOUNTER — Other Ambulatory Visit (HOSPITAL_COMMUNITY): Payer: Self-pay

## 2021-08-03 ENCOUNTER — Encounter: Payer: Self-pay | Admitting: Urology

## 2021-08-03 ENCOUNTER — Encounter: Payer: Self-pay | Admitting: Radiation Oncology

## 2021-08-03 ENCOUNTER — Inpatient Hospital Stay: Payer: PPO | Admitting: Hematology

## 2021-08-03 ENCOUNTER — Ambulatory Visit
Admission: RE | Admit: 2021-08-03 | Discharge: 2021-08-03 | Disposition: A | Discharge status: Home / Self Care | Payer: PPO | Source: Ambulatory Visit | Attending: Radiation Oncology | Admitting: Radiation Oncology

## 2021-08-03 VITALS — BP 141/83 | HR 79 | Temp 98.6°F | Resp 16 | Ht 70.0 in | Wt 158.2 lb

## 2021-08-03 VITALS — BP 129/72 | HR 75 | Temp 98.0°F | Resp 18

## 2021-08-03 DIAGNOSIS — G893 Neoplasm related pain (acute) (chronic): Secondary | ICD-10-CM | POA: Insufficient documentation

## 2021-08-03 DIAGNOSIS — M8450XA Pathological fracture in neoplastic disease, unspecified site, initial encounter for fracture: Secondary | ICD-10-CM | POA: Diagnosis not present

## 2021-08-03 DIAGNOSIS — D6959 Other secondary thrombocytopenia: Secondary | ICD-10-CM | POA: Insufficient documentation

## 2021-08-03 DIAGNOSIS — C61 Malignant neoplasm of prostate: Secondary | ICD-10-CM

## 2021-08-03 DIAGNOSIS — D638 Anemia in other chronic diseases classified elsewhere: Secondary | ICD-10-CM

## 2021-08-03 DIAGNOSIS — D649 Anemia, unspecified: Secondary | ICD-10-CM | POA: Diagnosis not present

## 2021-08-03 DIAGNOSIS — D6481 Anemia due to antineoplastic chemotherapy: Secondary | ICD-10-CM | POA: Insufficient documentation

## 2021-08-03 DIAGNOSIS — C7951 Secondary malignant neoplasm of bone: Secondary | ICD-10-CM

## 2021-08-03 DIAGNOSIS — U071 COVID-19: Secondary | ICD-10-CM | POA: Diagnosis not present

## 2021-08-03 DIAGNOSIS — Z5111 Encounter for antineoplastic chemotherapy: Secondary | ICD-10-CM | POA: Insufficient documentation

## 2021-08-03 DIAGNOSIS — Z510 Encounter for antineoplastic radiation therapy: Secondary | ICD-10-CM | POA: Diagnosis not present

## 2021-08-03 DIAGNOSIS — B20 Human immunodeficiency virus [HIV] disease: Secondary | ICD-10-CM | POA: Diagnosis not present

## 2021-08-03 LAB — CBC WITH DIFFERENTIAL/PLATELET
Abs Immature Granulocytes: 0.22 10*3/uL — ABNORMAL HIGH (ref 0.00–0.07)
Band Neutrophils: 0 %
Basophils Absolute: 0 10*3/uL (ref 0.0–0.1)
Basophils Relative: 1 %
Eosinophils Absolute: 0.2 10*3/uL (ref 0.0–0.5)
Eosinophils Relative: 7 %
HCT: 24.7 % — ABNORMAL LOW (ref 39.0–52.0)
Hemoglobin: 7.8 g/dL — ABNORMAL LOW (ref 13.0–17.0)
Immature Granulocytes: 8 %
Lymphocytes Relative: 19 %
Lymphs Abs: 0.6 10*3/uL — ABNORMAL LOW (ref 0.7–4.0)
MCH: 29.2 pg (ref 26.0–34.0)
MCHC: 31.6 g/dL (ref 30.0–36.0)
MCV: 92.5 fL (ref 80.0–100.0)
Monocytes Absolute: 0.4 10*3/uL (ref 0.1–1.0)
Monocytes Relative: 12 %
Neutro Abs: 1.5 10*3/uL — ABNORMAL LOW (ref 1.7–7.7)
Neutrophils Relative %: 51 %
Platelets: 148 10*3/uL — ABNORMAL LOW (ref 150–400)
RBC: 2.67 MIL/uL — ABNORMAL LOW (ref 4.22–5.81)
RDW: 18.7 % — ABNORMAL HIGH (ref 11.5–15.5)
Smear Review: NORMAL
WBC: 2.7 10*3/uL — ABNORMAL LOW (ref 4.0–10.5)
nRBC: 3 /100 WBC — ABNORMAL HIGH
nRBC: 3.4 % — ABNORMAL HIGH (ref 0.0–0.2)

## 2021-08-03 LAB — CMP (CANCER CENTER ONLY)
ALT: 11 U/L (ref 0–44)
AST: 20 U/L (ref 15–41)
Albumin: 3.6 g/dL (ref 3.5–5.0)
Alkaline Phosphatase: 514 U/L — ABNORMAL HIGH (ref 38–126)
Anion gap: 5 (ref 5–15)
BUN: 14 mg/dL (ref 8–23)
CO2: 26 mmol/L (ref 22–32)
Calcium: 8 mg/dL — ABNORMAL LOW (ref 8.9–10.3)
Chloride: 108 mmol/L (ref 98–111)
Creatinine: 0.65 mg/dL (ref 0.61–1.24)
GFR, Estimated: >60 mL/min (ref >60)
Glucose, Bld: 118 mg/dL — ABNORMAL HIGH (ref 70–99)
Potassium: 3.4 mmol/L — ABNORMAL LOW (ref 3.5–5.1)
Sodium: 139 mmol/L (ref 135–145)
Total Bilirubin: 0.3 mg/dL (ref 0.3–1.2)
Total Protein: 6.1 g/dL — ABNORMAL LOW (ref 6.5–8.1)

## 2021-08-03 LAB — FERRITIN: Ferritin: 890 ng/mL — ABNORMAL HIGH (ref 24–336)

## 2021-08-03 MED ORDER — PREDNISONE 5 MG PO TABS
5.0000 mg | ORAL_TABLET | Freq: Two times a day (BID) | ORAL | 1 refills | Status: DC
  Filled 2021-08-03: qty 60, 30d supply, fill #0

## 2021-08-03 MED ORDER — ABIRATERONE ACETATE 250 MG PO TABS
1000.0000 mg | ORAL_TABLET | Freq: Every day | ORAL | 1 refills | Status: DC
  Filled 2021-08-03 – 2021-08-04 (×2): qty 120, 30d supply, fill #0
  Filled 2021-08-24: qty 120, 30d supply, fill #1

## 2021-08-03 MED ORDER — LEUPROLIDE ACETATE (3 MONTH) 22.5 MG ~~LOC~~ KIT
22.5000 mg | PACK | Freq: Once | SUBCUTANEOUS | Status: AC
  Administered 2021-08-03: 22.5 mg via SUBCUTANEOUS
  Filled 2021-08-03: qty 22.5

## 2021-08-03 MED ORDER — LEUPROLIDE ACETATE (3 MONTH) 22.5 MG IM KIT
22.5000 mg | PACK | Freq: Once | INTRAMUSCULAR | Status: DC
  Filled 2021-08-03: qty 22.5

## 2021-08-03 NOTE — Patient Instructions (Signed)
Leuprolide depot injection What is this medication? LEUPROLIDE (loo PROE lide) is a man-made protein that acts like a natural hormone in the body. It decreases testosterone in men and decreases estrogen in women. In men, this medicine is used to treat advanced prostate cancer. In women, some forms of this medicine may be used to treat endometriosis, uterine fibroids, or other male hormone-related problems. This medicine may be used for other purposes; ask your health care provider or pharmacist if you have questions. COMMON BRAND NAME(S): Eligard, Fensolv, Lupron Depot, Lupron Depot-Ped, Viadur What should I tell my care team before I take this medication? They need to know if you have any of these conditions: diabetes heart disease or previous heart attack high blood pressure high cholesterol mental illness osteoporosis pain or difficulty passing urine seizures spinal cord metastasis stroke suicidal thoughts, plans, or attempt; a previous suicide attempt by you or a family member tobacco smoker unusual vaginal bleeding (women) an unusual or allergic reaction to leuprolide, benzyl alcohol, other medicines, foods, dyes, or preservatives pregnant or trying to get pregnant breast-feeding How should I use this medication? This medicine is for injection into a muscle or for injection under the skin. It is given by a health care professional in a hospital or clinic setting. The specific product will determine how it will be given to you. Make sure you understand which product you receive and how often you will receive it. Talk to your pediatrician regarding the use of this medicine in children. Special care may be needed. Overdosage: If you think you have taken too much of this medicine contact a poison control center or emergency room at once. NOTE: This medicine is only for you. Do not share this medicine with others. What if I miss a dose? It is important not to miss a dose. Call your  doctor or health care professional if you are unable to keep an appointment. Depot injections: Depot injections are given either once-monthly, every 12 weeks, every 16 weeks, or every 24 weeks depending on the product you are prescribed. The product you are prescribed will be based on if you are male or male, and your condition. Make sure you understand your product and dosing. What may interact with this medication? Do not take this medicine with any of the following medications: chasteberry cisapride dronedarone pimozide thioridazine This medicine may also interact with the following medications: herbal or dietary supplements, like black cohosh or DHEA male hormones, like estrogens or progestins and birth control pills, patches, rings, or injections male hormones, like testosterone other medicines that prolong the QT interval (abnormal heart rhythm) This list may not describe all possible interactions. Give your health care provider a list of all the medicines, herbs, non-prescription drugs, or dietary supplements you use. Also tell them if you smoke, drink alcohol, or use illegal drugs. Some items may interact with your medicine. What should I watch for while using this medication? Visit your doctor or health care professional for regular checks on your progress. During the first weeks of treatment, your symptoms may get worse, but then will improve as you continue your treatment. You may get hot flashes, increased bone pain, increased difficulty passing urine, or an aggravation of nerve symptoms. Discuss these effects with your doctor or health care professional, some of them may improve with continued use of this medicine. Male patients may experience a menstrual cycle or spotting during the first months of therapy with this medicine. If this continues, contact your doctor or  health care professional. This medicine may increase blood sugar. Ask your healthcare provider if changes in diet  or medicines are needed if you have diabetes. What side effects may I notice from receiving this medication? Side effects that you should report to your doctor or health care professional as soon as possible: allergic reactions like skin rash, itching or hives, swelling of the face, lips, or tongue breathing problems chest pain depression or memory disorders pain in your legs or groin pain at site where injected or implanted seizures severe headache signs and symptoms of high blood sugar such as being more thirsty or hungry or having to urinate more than normal. You may also feel very tired or have blurry vision swelling of the feet and legs suicidal thoughts or other mood changes visual changes vomiting Side effects that usually do not require medical attention (report to your doctor or health care professional if they continue or are bothersome): breast swelling or tenderness decrease in sex drive or performance diarrhea hot flashes loss of appetite muscle, joint, or bone pains nausea redness or irritation at site where injected or implanted skin problems or acne This list may not describe all possible side effects. Call your doctor for medical advice about side effects. You may report side effects to FDA at 1-800-FDA-1088. Where should I keep my medication? This drug is given in a hospital or clinic and will not be stored at home. NOTE: This sheet is a summary. It may not cover all possible information. If you have questions about this medicine, talk to your doctor, pharmacist, or health care provider.  2022 Elsevier/Gold Standard (2021-03-01 00:00:00)

## 2021-08-03 NOTE — Progress Notes (Addendum)
[place]   [phone] [phone]   Clinic Follow up Note   Patient Care Team: Lajean Manes, MD as PCP - General [name] [name], MD as PCP - Infectious Diseases (Infectious Diseases) Katheren Puller, RN as Oncology Nurse Navigator  Date of Service:  08/03/2021  CHIEF COMPLAINT: f/u of metastatic prostate cancer  CURRENT THERAPY:  -Casodex, 50 mg daily, starting 07/05/21 for one month -Lupron, started 07/06/21, now q58months -Palliative Radiation, 1/26-08/03/21 -Zometa, q81months, starting 07/06/21 -Zytiga 1000 mg daily and prednisone 10 mg daily, starting in mid February 2023  ASSESSMENT & PLAN:  Patrick [name] is a 71 y.o. male with   1. 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 PSA showed extreme elevation of >3,000. CT CAP showed widespread osseous metastatic disease with multiple fractures. Biopsy of soft tissue metastasis at L5 confirmed metastatic prostate cancer.  -he started casodex on 1/10 and Lupron and Zometa on 07/06/21 in the hospital. -he was seen by Dr. Tammi Klippel on 07/19/21 and is currently receiving palliative radiation to the L5 metastasis. He finished last dose today -He is clinically stable, pain is much improved, to move around with a walker.  -I recommend first-line Zytiga 1000 mg daily and prednisone 5 mg twice daily.  I discussed the benefits and side effects, he agrees to proceed. -Continue Lupron every 3 months -I discussed the role of chemotherapy.  Based on the phase 3 PEACE-1 trial data, additional docetaxel to ADT and abiraterone improves overall survival, patient is not a good candidate for such intensive therapy, especially docetaxel, due to his poor PS and lack of social support.  -will f/u in a month   2. Anemia, Thrombocytopenia -secondary to bone metastases and recent RT, no lab evidence of nutritional  anemia -hgb 7.8, plt 148k today (08/03/21) -will repeat CBC in 1-2 weeks and give PRBC 2u if Hg<8.0 -due to his metastatic cancer, I will hold on ESA  3. Bone metastasis and pain  -he is finishing palliative RT to T10-SI jointS -Zometa started on July 06, 2021, will continue every 3 months -He is on hydrocodone twice a day, is controlled.   PLAN: -We will proceed Lupron injection today and continue every 3 months -I called in Zytiga 1000 mg daily and prednisone 10 mg daily to [place] today, he will start when he receives it -Lab and 2u blood transfusion in 1 to 2 weeks if repeat hemoglobin less than 8.0 -lab and f/u in 4 weeks    No problem-specific Assessment & Plan notes found for this encounter.   SUMMARY OF ONCOLOGIC HISTORY: Oncology History  Prostate cancer metastatic to bone [facility])  07/01/2021 Cancer Staging   Staging form: Prostate, AJCC 8th Edition - Clinical stage from 07/01/2021: Stage IVB (cTX, cNX, pM1b) - Signed by Truitt Merle, MD on 07/05/2021    07/02/2021 Initial Diagnosis   Prostate cancer metastatic to bone [facility])      INTERVAL HISTORY:  Patrick [name] is here for a follow up of metastatic prostate cancer. He was last seen by me on 07/05/21 while he was in the hospital. He presents to the clinic alone.  He came in a wheelchair.  I initially saw him in the hospital when he was diagnosed with metastatic prostate cancer.  He was discharged to skilled nursing home [place].  His back pain has much improved and well controlled, he takes hydrocodone  twice a day.  He has been participating physical therapy, he is able to walk for more than 100 feet independently.  He will be discharged to home tomorrow.  He lives alone.    All other systems were reviewed with the patient and are negative.  MEDICAL HISTORY:  Past Medical History:  Diagnosis Date   Allergy    seasonal   Arthritis    r knee   H/O inguinal hernia repair    History of tobacco use    HIV  infection ([place])    Hyperlipidemia    Hypertension    Strabismic amblyopia     SURGICAL HISTORY: Past Surgical History:  Procedure Laterality Date   EYE SURGERY     strabismus   HERNIA REPAIR      I have reviewed the social history and family history with the patient and they are unchanged from previous note.  ALLERGIES:  has No Known Allergies.  MEDICATIONS:  Current Outpatient Medications  Medication Sig Dispense Refill   abiraterone acetate (ZYTIGA) 250 MG tablet Take 4 tablets (1,000 mg total) by mouth daily. Take on an empty stomach 1 hour before or 2 hours after a meal 120 tablet 1   predniSONE (DELTASONE) 5 MG tablet Take 1 tablet (5 mg total) by mouth 2 (two) times daily with a meal. 60 tablet 1   amLODipine (NORVASC) 2.5 MG tablet Take 2.5 mg by mouth daily.     atorvastatin (LIPITOR) 40 MG tablet TAKE 1 TABLET(40 MG) BY MOUTH DAILY (Patient taking differently: Take 40 mg by mouth daily.) 90 tablet 2   calcium citrate-vitamin D (CITRACAL+D) 315-200 MG-UNIT tablet Take 1 tablet by mouth daily.     feeding supplement (ENSURE ENLIVE / ENSURE PLUS) LIQD Take 237 mLs by mouth 3 (three) times daily between meals. 237 mL 60   gabapentin (NEURONTIN) 100 MG capsule Take by mouth.     lidocaine (LIDODERM) 5 % Place 1 patch onto the skin daily. Remove & Discard patch within 12 hours or as directed by MD 30 patch 0   Multiple Vitamins-Minerals (MULTIVITAMIN WITH MINERALS) tablet Take 1 tablet by mouth daily.     naproxen sodium (ALEVE) 220 MG tablet Take 220-440 mg by mouth See admin instructions. 440 mg in the morning 220 mg in the afternoon and at bedtime     ODEFSEY 200-25-25 MG TABS tablet TAKE 1 TABLET BY MOUTH EVERY DAY 90 tablet 0   No current facility-administered medications for this visit.    PHYSICAL EXAMINATION: ECOG PERFORMANCE STATUS: 3 - Symptomatic, >50% confined to bed  Vitals:   08/03/21 1607  BP: (!) 141/83  Pulse: 79  Resp: 16  Temp: 98.6 F (37 C)   SpO2: 100%   Wt Readings from Last 3 Encounters:  08/03/21 158 lb 3.2 oz (71.8 kg)  07/19/21 151 lb (68.5 kg)  07/06/21 138 lb 7.2 oz (62.8 kg)     GENERAL:alert, no distress and comfortable SKIN: skin color, texture, turgor are normal, no rashes or significant lesions EYES: normal, Conjunctiva are pink and non-injected, sclera clear Musculoskeletal:no cyanosis of digits and no clubbing  NEURO: alert & oriented x 3 with fluent speech, no focal motor/sensory deficits  LABORATORY DATA:  I have reviewed the data as listed CBC Latest Ref Rng & Units 08/03/2021 07/03/2021 07/02/2021  WBC 4.0 - 10.5 K/uL 2.7(L) 5.7 -  Hemoglobin 13.0 - 17.0 g/dL 7.8(L) 9.3(L) 9.5(L)  Hematocrit 39.0 - 52.0 % 24.7(L) 28.9(L) 27.9(L)  Platelets 150 -  400 K/uL 148(L) 120(L) -     CMP Latest Ref Rng & Units 08/03/2021 07/02/2021 07/01/2021  Glucose 70 - 99 mg/dL 118(H) 107(H) 113(H)  BUN 8 - 23 mg/dL 14 12 20   Creatinine 0.61 - 1.24 mg/dL 0.65 0.74 0.84  Sodium 135 - 145 mmol/L 139 135 130(L)  Potassium 3.5 - 5.1 mmol/L 3.4(L) 4.3 3.3(L)  Chloride 98 - 111 mmol/L 108 101 97(L)  CO2 22 - 32 mmol/L 26 26 21(L)  Calcium 8.9 - 10.3 mg/dL 8.0(L) 8.6(L) 8.4(L)  Total Protein 6.5 - 8.1 g/dL 6.1(L) - 5.1(L)  Total Bilirubin 0.3 - 1.2 mg/dL 0.3 - 0.6  Alkaline Phos 38 - 126 U/L 514(H) - 593(H)  AST 15 - 41 U/L 20 - 45(H)  ALT 0 - 44 U/L 11 - 20      RADIOGRAPHIC STUDIES: I have personally reviewed the radiological images as listed and agreed with the findings in the report. No results found.    No orders of the defined types were placed in this encounter.  All questions were answered. The patient knows to call the clinic with any problems, questions or concerns. No barriers to learning was detected. The total time spent in the appointment was 30 minutes.     Truitt Merle, MD 08/03/2021   I, Wilburn Mylar, am acting as scribe for Truitt Merle, MD.   I have reviewed the above documentation for accuracy and  completeness, and I agree with the above.

## 2021-08-04 ENCOUNTER — Telehealth: Payer: Self-pay | Admitting: Pharmacist

## 2021-08-04 ENCOUNTER — Other Ambulatory Visit (HOSPITAL_COMMUNITY): Payer: Self-pay

## 2021-08-04 ENCOUNTER — Ambulatory Visit: Payer: PPO | Admitting: Infectious Diseases

## 2021-08-04 ENCOUNTER — Telehealth: Payer: Self-pay

## 2021-08-04 ENCOUNTER — Encounter: Payer: Self-pay | Admitting: Hematology

## 2021-08-04 ENCOUNTER — Telehealth: Payer: Self-pay | Admitting: Hematology

## 2021-08-04 DIAGNOSIS — C7951 Secondary malignant neoplasm of bone: Secondary | ICD-10-CM

## 2021-08-04 DIAGNOSIS — M6258 Muscle wasting and atrophy, not elsewhere classified, other site: Secondary | ICD-10-CM | POA: Diagnosis not present

## 2021-08-04 DIAGNOSIS — C61 Malignant neoplasm of prostate: Secondary | ICD-10-CM

## 2021-08-04 DIAGNOSIS — M6281 Muscle weakness (generalized): Secondary | ICD-10-CM | POA: Diagnosis not present

## 2021-08-04 LAB — PSA, TOTAL AND FREE
PSA, Free Pct: 6.9 %
PSA, Free: 5.29 ng/mL
Prostate Specific Ag, Serum: 76.8 ng/mL — ABNORMAL HIGH (ref 0.0–4.0)

## 2021-08-04 MED ORDER — PREDNISONE 5 MG PO TABS
5.0000 mg | ORAL_TABLET | Freq: Every day | ORAL | 1 refills | Status: DC
  Filled 2021-08-04 (×2): qty 30, 30d supply, fill #0
  Filled 2021-08-24: qty 30, 30d supply, fill #1

## 2021-08-04 NOTE — Telephone Encounter (Signed)
Oral Chemotherapy Pharmacist Encounter  I spoke with patient for overview of: Zytiga for the treatment of metastatic, castration-sensitive prostate cancer in conjunction with prednisone, planned duration until disease progression or unacceptable toxicity.   Counseled patient on administration, dosing, side effects, monitoring, drug-food interactions, safe handling, storage, and disposal.  Patient will take Zytiga 250mg  tablets, 4 tablets (1000mg ) by mouth once daily on an empty stomach, 1 hour before or 2 hours after a meal.  Patient states he will take his Zytiga when he wakes up in the morning and will wait at least 1 hour before eating.  Patient knows to avoid grapefruit and grapefruit juice while on Zytiga.  Patient will take prednisone 5mg  tablet, 1 tablet by mouth one daily with breakfast.  Zytiga start date: 08/08/21  Adverse effects include but are not limited to: peripheral edema, GI upset, hypertension, hot flashes, fatigue, and arthralgias.    Pred[facility]one prescription [facility] been [facility]. Patient will obtain prednisone and knows to start prednisone on the same day as Zytiga start.  Reviewed with patient importance of keeping a medication schedule and plan for any missed doses. No barriers to medication adherence identified.  Medication reconciliation performed and medication/allergy list updated.  Insurance authorization for Patrick [name] has been obtained. This will ship from the [facility] on 08/04/21 to deliver to patient's home on 08/08/21.  Patient informed the pharmacy will reach out 5-7 days prior to needing next fill of Zytiga to coordinate continued medication acquisition to prevent break in therapy.  All questions answered.  Patrick [name] voiced understanding and appreciation.   Medication education handout placed in mail for patient. Patient knows to call the office with questions or concerns. Oral Chemotherapy Clinic phone  number provided to patient.   Leron Croak, PharmD, BCPS Hematology/Oncology Clinical Pharmacist [facility] and [place] [phone] 08/04/2021 2:15 PM

## 2021-08-04 NOTE — Telephone Encounter (Signed)
Oral Oncology Patient Advocate Encounter  Prior Authorization for Patrick [name] has been approved.    PA# [id number] Effective dates: 08/04/21 through 08/04/22  Patients co-pay is $123.81  Oral Oncology Clinic will continue to follow.   [place] Patient [place] Phone [phone] Fax [phone] 08/04/2021 9:42 AM

## 2021-08-04 NOTE — Telephone Encounter (Signed)
Oral Oncology Patient Advocate Encounter   Was successful in securing patient a $3500 grant from [facility] [place] (PAF) to provide copayment coverage for Zytiga.  This will keep the out of pocket expense at $0.     I have spoken with the patient.    The billing information is as follows and has been shared with [place]: [id number] PCN:  [id number] Member ID: [id number] Group ID: [id number] Dates of Eligibility: 08/05/21 through 08/05/22  [place] Patient [place] Phone [phone] Fax [phone] 08/04/2021 12:10 PM

## 2021-08-04 NOTE — Telephone Encounter (Addendum)
Oral Oncology Pharmacist Encounter  Received new prescription for Zytiga (abiraterone acetate) for the treatment of metastatic prostate cancer in conjunction with Lupron and prednisone, planned duration until disease progression or unacceptable drug toxicity.  CMP, CBC w/ Diff from 08/03/21 assessed, noted alk phos 514 U/L, likely secondary to bone mets, WBC 2.7 K/uL (ANC 1500), Hgb 7.8 g/dL. No baseline dose adjustments required at this time. Prescription dose and frequency assessed for appropriateness. Appropriate for therapy initiation. Confirmed with Dr. Burr Medico OK to switch patient to once daily prednisone dosing since patient is mCSPC.  Current medication list in Epic reviewed, DDIs with Zytiga identified: Category C DDI between Zytiga and Atorvastatin - Zytiga may increase risk of myopathic side effects of atorvastatin. No change in therapy warranted at this time. Recommend monitoring for increase muscle pain, dark colored urine.   Evaluated chart and no patient barriers to medication adherence noted.   Patient agreement for treatment documented in MD note on 08/03/21.  Prescription has been e-scribed to the [facility] for benefits analysis and approval.  Oral Oncology Clinic will continue to follow for insurance authorization, copayment issues, initial counseling and start date.  Leron Croak, PharmD, BCPS Hematology/Oncology Clinical Pharmacist [facility] and [place] [phone] 08/04/2021 8:47 AM

## 2021-08-04 NOTE — Telephone Encounter (Signed)
Oral Oncology Patient Advocate Encounter   Received notification from [facility] Advantage that prior authorization for Zytiga is required.   PA submitted on CoverMyMeds Key [id number] Status is pending   Oral Oncology Clinic will continue to follow.  [place] Patient [place] Phone [phone] Fax [phone] 08/04/2021 9:39 AM

## 2021-08-04 NOTE — Telephone Encounter (Signed)
Scheduled follow-up appointments per 2/8 los. Patient is aware. 

## 2021-08-08 LAB — TESTOSTERONE, FREE, TOTAL, SHBG
Sex Hormone Binding: 75.5 nmol/L (ref 19.3–76.4)
Testosterone, Free: 0.2 pg/mL — ABNORMAL LOW (ref 6.6–18.1)
Testosterone: <3 ng/dL — ABNORMAL LOW (ref 264–916)

## 2021-08-09 DIAGNOSIS — M6258 Muscle wasting and atrophy, not elsewhere classified, other site: Secondary | ICD-10-CM | POA: Diagnosis not present

## 2021-08-12 ENCOUNTER — Telehealth: Payer: Self-pay

## 2021-08-12 ENCOUNTER — Other Ambulatory Visit: Payer: Self-pay

## 2021-08-12 ENCOUNTER — Inpatient Hospital Stay: Payer: PPO

## 2021-08-12 ENCOUNTER — Other Ambulatory Visit: Payer: Self-pay | Admitting: Hematology

## 2021-08-12 DIAGNOSIS — Z510 Encounter for antineoplastic radiation therapy: Secondary | ICD-10-CM | POA: Diagnosis not present

## 2021-08-12 DIAGNOSIS — C61 Malignant neoplasm of prostate: Secondary | ICD-10-CM

## 2021-08-12 DIAGNOSIS — C7951 Secondary malignant neoplasm of bone: Secondary | ICD-10-CM

## 2021-08-12 DIAGNOSIS — D638 Anemia in other chronic diseases classified elsewhere: Secondary | ICD-10-CM

## 2021-08-12 LAB — CBC WITH DIFFERENTIAL/PLATELET
Abs Immature Granulocytes: 0.05 10*3/uL (ref 0.00–0.07)
Basophils Absolute: 0 10*3/uL (ref 0.0–0.1)
Basophils Relative: 1 %
Eosinophils Absolute: 0.2 10*3/uL (ref 0.0–0.5)
Eosinophils Relative: 7 %
HCT: 24.8 % — ABNORMAL LOW (ref 39.0–52.0)
Hemoglobin: 7.7 g/dL — ABNORMAL LOW (ref 13.0–17.0)
Immature Granulocytes: 2 %
Lymphocytes Relative: 13 %
Lymphs Abs: 0.4 10*3/uL — ABNORMAL LOW (ref 0.7–4.0)
MCH: 29.1 pg (ref 26.0–34.0)
MCHC: 31 g/dL (ref 30.0–36.0)
MCV: 93.6 fL (ref 80.0–100.0)
Monocytes Absolute: 0.5 10*3/uL (ref 0.1–1.0)
Monocytes Relative: 15 %
Neutro Abs: 2 10*3/uL (ref 1.7–7.7)
Neutrophils Relative %: 62 %
Platelets: 140 10*3/uL — ABNORMAL LOW (ref 150–400)
RBC: 2.65 MIL/uL — ABNORMAL LOW (ref 4.22–5.81)
RDW: 19.2 % — ABNORMAL HIGH (ref 11.5–15.5)
WBC: 3.1 10*3/uL — ABNORMAL LOW (ref 4.0–10.5)
nRBC: 1 % — ABNORMAL HIGH (ref 0.0–0.2)

## 2021-08-12 LAB — COMPREHENSIVE METABOLIC PANEL
ALT: 8 U/L (ref 0–44)
AST: 17 U/L (ref 15–41)
Albumin: 3.9 g/dL (ref 3.5–5.0)
Alkaline Phosphatase: 431 U/L — ABNORMAL HIGH (ref 38–126)
Anion gap: 5 (ref 5–15)
BUN: 17 mg/dL (ref 8–23)
CO2: 27 mmol/L (ref 22–32)
Calcium: 8.7 mg/dL — ABNORMAL LOW (ref 8.9–10.3)
Chloride: 107 mmol/L (ref 98–111)
Creatinine, Ser: 0.49 mg/dL — ABNORMAL LOW (ref 0.61–1.24)
GFR, Estimated: >60 mL/min (ref >60)
Glucose, Bld: 96 mg/dL (ref 70–99)
Potassium: 4.4 mmol/L (ref 3.5–5.1)
Sodium: 139 mmol/L (ref 135–145)
Total Bilirubin: 0.5 mg/dL (ref 0.3–1.2)
Total Protein: 6.1 g/dL — ABNORMAL LOW (ref 6.5–8.1)

## 2021-08-12 LAB — PREPARE RBC (CROSSMATCH)

## 2021-08-12 LAB — SAMPLE TO BLOOD BANK

## 2021-08-12 LAB — FERRITIN: Ferritin: 723 ng/mL — ABNORMAL HIGH (ref 24–336)

## 2021-08-12 NOTE — Telephone Encounter (Signed)
Pt missed appointment this morning d/t transportation was not arranged for pickup.  Notified infusion regarding the situation and infusion will reschedule the patient for Saturday 08/13/2021 if blood products is needed.  Arranged for pickup to pick up pt today so labs can be drawn.  Will place orders for blood products based on labs today.  Called pt back to update him on the plan.  Pt is agreeable to plan of action.

## 2021-08-12 NOTE — Progress Notes (Signed)
Nikki in Blood Bank confirmed orders for T&S and Prepare for 1unit PRBC to be transfused on Saturday 08/13/2021.  Sent Dr. Burr Medico a staff message to put in Blood Atestation order.

## 2021-08-12 NOTE — Telephone Encounter (Signed)
Spoke with pt via telephone to confirm infusion appointment scheduled for 08/13/2021 @0900  for 1 unit PRBCs.  Transportation for pickup is arranged with [employer] for pickup in the morning.

## 2021-08-13 ENCOUNTER — Inpatient Hospital Stay: Payer: PPO

## 2021-08-13 ENCOUNTER — Other Ambulatory Visit: Payer: Self-pay

## 2021-08-13 DIAGNOSIS — D638 Anemia in other chronic diseases classified elsewhere: Secondary | ICD-10-CM

## 2021-08-13 DIAGNOSIS — Z510 Encounter for antineoplastic radiation therapy: Secondary | ICD-10-CM | POA: Diagnosis not present

## 2021-08-13 MED ORDER — SODIUM CHLORIDE 0.9% IV SOLUTION
250.0000 mL | Freq: Once | INTRAVENOUS | Status: AC
  Administered 2021-08-13: 250 mL via INTRAVENOUS

## 2021-08-13 NOTE — Patient Instructions (Signed)
Blood Transfusion, Adult, Care After This sheet gives you information about how to care for yourself after your procedure. Your doctor may also give you more specific instructions. If you have problems or questions, contact your doctor. What can I expect after the procedure? After the procedure, it is common to have: Bruising and soreness at the IV site. A headache. Follow these instructions at home: Insertion site care   Follow instructions from your doctor about how to take care of your insertion site. This is where an IV tube was put into your vein. Make sure you: Wash your hands with soap and water before and after you change your bandage (dressing). If you cannot use soap and water, use hand sanitizer. Change your bandage as told by your doctor. Check your insertion site every day for signs of infection. Check for: Redness, swelling, or pain. Bleeding from the site. Warmth. Pus or a bad smell. General instructions Take over-the-counter and prescription medicines only as told by your doctor. Rest as told by your doctor. Go back to your normal activities as told by your doctor. Keep all follow-up visits as told by your doctor. This is important. Contact a doctor if: You have itching or red, swollen areas of skin (hives). You feel worried or nervous (anxious). You feel weak after doing your normal activities. You have redness, swelling, warmth, or pain around the insertion site. You have blood coming from the insertion site, and the blood does not stop with pressure. You have pus or a bad smell coming from the insertion site. Get help right away if: You have signs of a serious reaction. This may be coming from an allergy or the body's defense system (immune system). Signs include: Trouble breathing or shortness of breath. Swelling of the face or feeling warm (flushed). Fever or chills. Head, chest, or back pain. Dark pee (urine) or blood in the pee. Widespread rash. Fast  heartbeat. Feeling dizzy or light-headed. You may receive your blood transfusion in an outpatient setting. If so, you will be told whom to contact to report any reactions. These symptoms may be an emergency. Do not wait to see if the symptoms will go away. Get medical help right away. Call your local emergency services (911 in the [place] Do not drive yourself to the hospital. Summary Bruising and soreness at the IV site are common. Check your insertion site every day for signs of infection. Rest as told by your doctor. Go back to your normal activities as told by your doctor. Get help right away if you have signs of a serious reaction. This information is not intended to replace advice given to you by your health care provider. Make sure you discuss any questions you have with your health care provider. Document Revised: 10/07/2020 Document Reviewed: 12/05/2018 Elsevier Patient Education  2022 Elsevier Inc.  

## 2021-08-15 LAB — BPAM RBC
Blood Product Expiration Date: 202303052359
ISSUE DATE / TIME: 202302180919
Unit Type and Rh: 600

## 2021-08-15 LAB — TYPE AND SCREEN
ABO/RH(D): A NEG
Antibody Screen: NEGATIVE
Unit division: 0

## 2021-08-17 ENCOUNTER — Inpatient Hospital Stay: Payer: PPO

## 2021-08-17 ENCOUNTER — Inpatient Hospital Stay: Payer: PPO | Admitting: Hematology

## 2021-08-19 ENCOUNTER — Ambulatory Visit: Payer: PPO | Admitting: Infectious Diseases

## 2021-08-23 ENCOUNTER — Other Ambulatory Visit (HOSPITAL_COMMUNITY): Payer: Self-pay

## 2021-08-23 ENCOUNTER — Encounter: Payer: Self-pay | Admitting: Urology

## 2021-08-23 NOTE — Progress Notes (Signed)
Spoke w/ patient, verified identity, and began nursing interview. Patient reports dorsalgia 5/10, and nocturia x3. No other issues reported at this time. ? ?Meaningful use complete. ?I-PSS score of 3 (mild). ?No urinary management medications. ?Urology appointment- None -per patient ? ?Patient reminded of his 11:00am-08/31/21 telephone appointment w/ Ashlyn Bruning PA-C. I left my extension [phone] in case patient needs to call. Patient verbalized understanding of information. ? ?Patient contact [phone] ?

## 2021-08-24 ENCOUNTER — Other Ambulatory Visit (HOSPITAL_COMMUNITY): Payer: Self-pay

## 2021-08-25 ENCOUNTER — Ambulatory Visit (INDEPENDENT_AMBULATORY_CARE_PROVIDER_SITE_OTHER): Payer: PPO | Admitting: Infectious Diseases

## 2021-08-25 ENCOUNTER — Other Ambulatory Visit: Payer: Self-pay

## 2021-08-25 ENCOUNTER — Encounter: Payer: Self-pay | Admitting: Infectious Diseases

## 2021-08-25 VITALS — BP 155/96 | HR 86 | Temp 97.9°F | Resp 16 | Ht 70.0 in | Wt 158.0 lb

## 2021-08-25 DIAGNOSIS — Z79899 Other long term (current) drug therapy: Secondary | ICD-10-CM

## 2021-08-25 DIAGNOSIS — Z113 Encounter for screening for infections with a predominantly sexual mode of transmission: Secondary | ICD-10-CM

## 2021-08-25 DIAGNOSIS — C7951 Secondary malignant neoplasm of bone: Secondary | ICD-10-CM

## 2021-08-25 DIAGNOSIS — C61 Malignant neoplasm of prostate: Secondary | ICD-10-CM | POA: Diagnosis not present

## 2021-08-25 DIAGNOSIS — B20 Human immunodeficiency virus [HIV] disease: Secondary | ICD-10-CM

## 2021-08-25 NOTE — Assessment & Plan Note (Signed)
apprecite Dr Tammi Klippel and Fong's f/u ?Will await to see if he gets CTX.  ?

## 2021-08-25 NOTE — Assessment & Plan Note (Signed)
Will continue on his odefsy.  ?He is doing well ?Not sexually active.  ?He has gotten flu shot, defers COVID shot.  ?has gotten shingles vax.  ?Will see him back in 6 months.  ? ?

## 2021-08-25 NOTE — Progress Notes (Signed)
? ?  Subjective:  ? ? Patient ID: Patrick [name], male  DOB: 1950/07/30, 71 y.o.        MRN: [id number] ? ? ?HPI ?71 yo M HIV+ on ART since 2002, hyperlipidemia.  ?Currently on odefsy and baby ASA, Ca citrate, Vit D. Lisinopril-HCTZ.  ?On lipitor 40mg . ?No problems with odefsy ?Since his last ID visit he has been dx (06-2021) with prostate cancer with bone mets. He is getting xrt.  ?He denies pain. He is able to "cruise" due to being off balance. He may start CTX, has f/u with Dr Morey Hummingbird next week.  ?Lives at home, alone, no help.  ? ?HIV 1 RNA Quant  ?Date Value  ?06/30/2021 <20 copies/mL  ?09/03/2020 Not Detected Copies/mL  ?08/19/2019 <20 NOT DETECTED copies/mL  ? ?CD4 T Cell Abs (/uL)  ?Date Value  ?08/19/2019 503  ?08/28/2018 450  ?08/29/2017 590  ? ? ? ?Health Maintenance  ?Topic Date Due  ?? COVID-19 Vaccine (1) Never done  ?? TETANUS/TDAP  04/30/2017  ?? COLONOSCOPY (Pts 45-4yrs Insurance coverage will need to be confirmed)  01/01/2022  ?? Pneumonia Vaccine 29+ Years old  Completed  ?? INFLUENZA VACCINE  Completed  ?? Hepatitis C Screening  Completed  ?? Zoster Vaccines- Shingrix  Completed  ?? HPV VACCINES  Aged Out  ? ? ? ? ?Review of Systems  ?Constitutional:  Positive for weight loss. Negative for chills and fever.  ?Respiratory:  Positive for cough and sputum production. Negative for shortness of breath.   ?Cardiovascular:  Positive for leg swelling (he attributes to his hormone therapy. is not able to keep his legs up.). Negative for chest pain.  ?Gastrointestinal:  Positive for constipation. Negative for diarrhea.  ?Genitourinary:  Negative for dysuria and urgency.  ?Neurological:  Negative for headaches.  ? ?Please see HPI. All other systems reviewed and negative. ? ?   ?Objective:  ?Physical Exam ?Vitals reviewed.  ?Constitutional:   ?   General: He is not in acute distress. ?   Appearance: Normal appearance. He is not toxic-appearing.  ?HENT:  ?   Mouth/Throat:  ?   Mouth: Mucous membranes are moist.   ?   Pharynx: No oropharyngeal exudate.  ?Eyes:  ?   Extraocular Movements: Extraocular movements intact.  ?   Pupils: Pupils are equal, round, and reactive to light.  ?Cardiovascular:  ?   Rate and Rhythm: Normal rate and regular rhythm.  ?Pulmonary:  ?   Effort: Pulmonary effort is normal.  ?   Breath sounds: Normal breath sounds.  ?Abdominal:  ?   General: Bowel sounds are normal. There is no distension.  ?   Palpations: Abdomen is soft.  ?   Tenderness: There is no abdominal tenderness.  ?Musculoskeletal:     ?   General: Normal range of motion.  ?   Cervical back: Normal range of motion and neck supple.  ?   Right lower leg: Edema present.  ?   Left lower leg: Edema present.  ?Neurological:  ?   Mental Status: He is alert.  ?Psychiatric:     ?   Mood and Affect: Mood normal.  ? ? ? ? ? ?   ?Assessment & Plan:  ? ?

## 2021-08-29 ENCOUNTER — Other Ambulatory Visit (HOSPITAL_COMMUNITY): Payer: Self-pay

## 2021-08-31 ENCOUNTER — Ambulatory Visit
Admission: RE | Admit: 2021-08-31 | Discharge: 2021-08-31 | Disposition: A | Discharge status: Home / Self Care | Payer: PPO | Source: Ambulatory Visit | Attending: Urology | Admitting: Urology

## 2021-08-31 ENCOUNTER — Other Ambulatory Visit: Payer: Self-pay

## 2021-08-31 DIAGNOSIS — C61 Malignant neoplasm of prostate: Secondary | ICD-10-CM

## 2021-08-31 DIAGNOSIS — C7951 Secondary malignant neoplasm of bone: Secondary | ICD-10-CM | POA: Insufficient documentation

## 2021-08-31 NOTE — Progress Notes (Signed)
?Radiation Oncology         [phone] ?________________________________ ? ?Name: Patrick [name] MRN: [id number]  ?Date: 08/31/2021  DOB: 1951/02/21 ? ?Post Treatment Note ? ?CC: Lajean Manes, MD  Truitt Merle, MD ? 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 ? ?Interval Since Last Radiation:  4 weeks  ?07/21/21 - 08/03/21: The painful site of metastasis at L5 was treated to 30 Gy in 10 fractions of 3 Gy each. ? ?Narrative:  I spoke with the patient to conduct his routine scheduled 1 month follow up visit via telephone to spare the patient unnecessary potential exposure in the healthcare setting during the current COVID-19 pandemic.  The patient was notified in advance and gave permission to proceed with this visit format. ? ?He tolerated radiation treatment relatively well without any ill side effects.                             ? ?On review of systems, the patient states that he is doing well in general.  He has low back pain is significantly improved and he is only requiring Advil/Aleve as needed for pain control at this point.  He is able to ambulate more comfortably with the assistance of a walker which he is quite pleased with.  He has had some significant fatigue which may be attributed to the anemia.  He had a recent blood transfusion approximately 2 weeks ago and this did improve his energy level.  He has continued with some numbness/tingling in his feet but denies any focal weakness or other sites of focal pain.  He has not had any change in his bowel or bladder control and overall, is pleased with his progress to date. ? ?ALLERGIES:  has No Known Allergies. ? ?Meds: ?Current Outpatient Medications  ?Medication Sig Dispense Refill  ? abiraterone acetate (ZYTIGA) 250 MG tablet Take 4 tablets (1,000 mg total) by mouth daily. Take on an empty stomach 1 hour before or 2 hours after a meal 120 tablet 1  ? albuterol (VENTOLIN HFA) 108  (90 Base) MCG/ACT inhaler Inhale into the lungs.    ? amLODipine (NORVASC) 2.5 MG tablet Take 2.5 mg by mouth daily. (Patient not taking: Reported on 08/25/2021)    ? atorvastatin (LIPITOR) 40 MG tablet TAKE 1 TABLET(40 MG) BY MOUTH DAILY (Patient taking differently: Take 40 mg by mouth daily.) 90 tablet 2  ? calcium citrate-vitamin D (CITRACAL+D) 315-200 MG-UNIT tablet Take 1 tablet by mouth daily.    ? DULoxetine (CYMBALTA) 60 MG capsule Take 60 mg by mouth daily. (Patient not taking: Reported on 08/25/2021)    ? ELIQUIS 2.5 MG TABS tablet Take 2.5 mg by mouth 2 (two) times daily.    ? gabapentin (NEURONTIN) 100 MG capsule Take by mouth.    ? hydrALAZINE (APRESOLINE) 25 MG tablet Take by mouth daily. (Patient not taking: Reported on 08/25/2021)    ? HYDROcodone-acetaminophen (NORCO/VICODIN) 5-325 MG tablet Take 1 tablet by mouth 2 (two) times daily as needed. (Patient not taking: Reported on 08/25/2021)    ? lidocaine (LIDODERM) 5 % Place 1 patch onto the skin daily. Remove & Discard patch within 12 hours or as directed by MD (Patient not taking: Reported on 08/25/2021) 30 patch 0  ? Multiple Vitamins-Minerals (MULTIVITAMIN WITH MINERALS) tablet Take 1 tablet by mouth daily.    ? naproxen sodium (  ALEVE) 220 MG tablet Take 220-440 mg by mouth See admin instructions. 440 mg in the morning ?220 mg in the afternoon and at bedtime    ? ODEFSEY 200-25-25 MG TABS tablet TAKE 1 TABLET BY MOUTH EVERY DAY 90 tablet 0  ? predniSONE (DELTASONE) 5 MG tablet Take 1 tablet (5 mg total) by mouth daily with breakfast. 30 tablet 1  ? ?No current facility-administered medications for this encounter.  ? ? ?Physical Findings: ? vitals were not taken for this visit.  ?Pain Assessment ?Pain Score: 5  (Dorsalgia)/10 ?Unable to assess due to telephone follow-up visit format. ? ?Lab Findings: ?Lab Results  ?Component Value Date  ? WBC 3.1 (L) 08/12/2021  ? HGB 7.7 (L) 08/12/2021  ? HCT 24.8 (L) 08/12/2021  ? MCV 93.6 08/12/2021  ? PLT 140 (L)  08/12/2021  ? ? ? ?Radiographic Findings: ?No results found. ? ?Impression/Plan: ?72. 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 ?He appears to have recovered well from the effects of his recent palliative radiotherapy and is currently without complaints.  His back pain is much improved and he is now able to ambulate more comfortably with the assistance of a walker.  He does have mild fatigue but feels that this is gradually improving.  We discussed that while we are happy to continue to participate in his care clinically indicated, at this point, we will plan to see him back on an as-needed basis.  He will continue in routine follow-up under the care and direction of Dr. Burr Medico for continued management of his systemic disease and is currently tolerating the ADT and Zytiga fairly well despite hot flashes and decreased stamina.  Overall, he is pleased with his progress to date and knows that he is welcome to call at anytime with any questions or concerns related to his previous radiation..  We enjoyed taking care of him and look forward to continuing to follow his progress via correspondence. ? ? ? ?Nicholos Johns, PA-C  ?

## 2021-08-31 NOTE — Progress Notes (Signed)
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     Indication for treatment:  Palliation       Radiation treatment dates:   07/21/21 - 08/03/21  Site/dose:   The painful site of metastasis at L5 was treated to 30 Gy in 10 fractions of 3 Gy each.  Beams/energy:   A 3D field set-up was employed with 6 MV X-rays  Narrative: The patient tolerated radiation treatment relatively well without any ill side effects.  Plan: The patient has completed radiation treatment. The patient will return to radiation oncology clinic for routine followup in one month. I advised him to call or return sooner if he has any questions or concerns related to his recovery or treatment. ________________________________  Sheral Apley. Tammi Klippel, M.D.

## 2021-09-01 DIAGNOSIS — M6258 Muscle wasting and atrophy, not elsewhere classified, other site: Secondary | ICD-10-CM | POA: Diagnosis not present

## 2021-09-01 DIAGNOSIS — M6281 Muscle weakness (generalized): Secondary | ICD-10-CM | POA: Diagnosis not present

## 2021-09-02 ENCOUNTER — Inpatient Hospital Stay (HOSPITAL_BASED_OUTPATIENT_CLINIC_OR_DEPARTMENT_OTHER): Payer: PPO | Admitting: Hematology

## 2021-09-02 ENCOUNTER — Inpatient Hospital Stay: Payer: PPO | Attending: Hematology

## 2021-09-02 ENCOUNTER — Other Ambulatory Visit: Payer: Self-pay

## 2021-09-02 VITALS — BP 151/87 | HR 80 | Temp 98.4°F | Resp 20 | Wt 164.8 lb

## 2021-09-02 DIAGNOSIS — C7951 Secondary malignant neoplasm of bone: Secondary | ICD-10-CM

## 2021-09-02 DIAGNOSIS — Z7952 Long term (current) use of systemic steroids: Secondary | ICD-10-CM | POA: Diagnosis not present

## 2021-09-02 DIAGNOSIS — Z79899 Other long term (current) drug therapy: Secondary | ICD-10-CM | POA: Insufficient documentation

## 2021-09-02 DIAGNOSIS — Z7901 Long term (current) use of anticoagulants: Secondary | ICD-10-CM | POA: Insufficient documentation

## 2021-09-02 DIAGNOSIS — C61 Malignant neoplasm of prostate: Secondary | ICD-10-CM | POA: Diagnosis not present

## 2021-09-02 DIAGNOSIS — G893 Neoplasm related pain (acute) (chronic): Secondary | ICD-10-CM | POA: Diagnosis not present

## 2021-09-02 DIAGNOSIS — D649 Anemia, unspecified: Secondary | ICD-10-CM | POA: Insufficient documentation

## 2021-09-02 LAB — COMPREHENSIVE METABOLIC PANEL
ALT: 14 U/L (ref 0–44)
AST: 18 U/L (ref 15–41)
Albumin: 4 g/dL (ref 3.5–5.0)
Alkaline Phosphatase: 276 U/L — ABNORMAL HIGH (ref 38–126)
Anion gap: 7 (ref 5–15)
BUN: 17 mg/dL (ref 8–23)
CO2: 27 mmol/L (ref 22–32)
Calcium: 9 mg/dL (ref 8.9–10.3)
Chloride: 105 mmol/L (ref 98–111)
Creatinine, Ser: 0.54 mg/dL — ABNORMAL LOW (ref 0.61–1.24)
GFR, Estimated: >60 mL/min (ref >60)
Glucose, Bld: 75 mg/dL (ref 70–99)
Potassium: 4 mmol/L (ref 3.5–5.1)
Sodium: 139 mmol/L (ref 135–145)
Total Bilirubin: 0.5 mg/dL (ref 0.3–1.2)
Total Protein: 6 g/dL — ABNORMAL LOW (ref 6.5–8.1)

## 2021-09-02 LAB — CBC WITH DIFFERENTIAL/PLATELET
Abs Immature Granulocytes: 0.01 10*3/uL (ref 0.00–0.07)
Basophils Absolute: 0 10*3/uL (ref 0.0–0.1)
Basophils Relative: 1 %
Eosinophils Absolute: 0.3 10*3/uL (ref 0.0–0.5)
Eosinophils Relative: 10 %
HCT: 28.6 % — ABNORMAL LOW (ref 39.0–52.0)
Hemoglobin: 9.4 g/dL — ABNORMAL LOW (ref 13.0–17.0)
Immature Granulocytes: 0 %
Lymphocytes Relative: 25 %
Lymphs Abs: 0.7 10*3/uL (ref 0.7–4.0)
MCH: 31.5 pg (ref 26.0–34.0)
MCHC: 32.9 g/dL (ref 30.0–36.0)
MCV: 96 fL (ref 80.0–100.0)
Monocytes Absolute: 0.4 10*3/uL (ref 0.1–1.0)
Monocytes Relative: 13 %
Neutro Abs: 1.4 10*3/uL — ABNORMAL LOW (ref 1.7–7.7)
Neutrophils Relative %: 51 %
Platelets: 196 10*3/uL (ref 150–400)
RBC: 2.98 MIL/uL — ABNORMAL LOW (ref 4.22–5.81)
RDW: 19.7 % — ABNORMAL HIGH (ref 11.5–15.5)
WBC: 2.8 10*3/uL — ABNORMAL LOW (ref 4.0–10.5)
nRBC: 0 % (ref 0.0–0.2)

## 2021-09-02 MED ORDER — HYDROCODONE-ACETAMINOPHEN 5-325 MG PO TABS: 1.0000 | ORAL_TABLET | Freq: Two times a day (BID) | ORAL | 0 refills | Status: DC | PRN

## 2021-09-02 MED ORDER — GABAPENTIN 100 MG PO CAPS: 100.0000 mg | ORAL_CAPSULE | Freq: Three times a day (TID) | ORAL | 1 refills | Status: DC

## 2021-09-02 NOTE — Progress Notes (Signed)
[name]   Telephone:([phone]) [phone] Fax:([phone]) [phone]   Clinic Follow up Note   Patient Care Team: Lajean Manes, MD as PCP - General [name] [name], MD as PCP - Infectious Diseases (Infectious Diseases) Katheren Puller, RN as Oncology Nurse Navigator  Date of Service:  09/02/2021  CHIEF COMPLAINT: f/u of metastatic prostate cancer  CURRENT THERAPY:  -Lupron, started 07/06/21, now q83month -Zometa, q362month starting 07/06/21 -Zytiga 1000 mg daily and prednisone 10 mg, starting in 07/2021  ASSESSMENT & PLAN:  Patrick [name] is a [age].o. male with   1. 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 PSA showed extreme elevation of >3,000. CT CAP showed widespread osseous metastatic disease with multiple fractures. Biopsy of soft tissue metastasis at L5 confirmed metastatic prostate cancer.  -he started casodex on 1/10, and Lupron and Zometa on 07/06/21 in the hospital. -he received palliative radiation to L5 under Dr. MaTammi Klippel/26-08/03/21 -he started Zytiga '1000mg'$  with prednisone '10mg'$  in mid 07/2021 -he is tolerating Zytiga and Lupron well. Labs reviewed, overall stable to improving. He will continue Lupron every 3 months.   2. Anemia, Thrombocytopenia -secondary to bone metastases, no lab evidence of nutritional anemia -due to his metastatic cancer, I will hold on ESA -he received 1u RBC on 08/13/21 -hgb 9.4, plt 196k today (09/02/21)   3. Bone metastasis and pain  -he received palliative RT to T10-SI joints under Dr. MaTammi Klippel/26-08/03/21 -Zometa started on 07/06/21, will continue every 3 months -He is on hydrocodone twice a day, is controlled.     PLAN: -continue Zytiga 1000 mg and prednisone 10 mg daily -lab, f/u, and next Lupron on 5/3 -will reach out to IR to see if he is a candidate for Kyphoplasty    No problem-specific Assessment & Plan notes found  for this encounter.   SUMMARY OF ONCOLOGIC HISTORY: Oncology History Overview Note   Cancer Staging  [facility] to bone ([facility] form: Prostate, AJCC 8th Edition - Clinical stage from 07/01/2021: Stage IVB (cTX, cNX, pM1b) - Signed by FeTruitt MerleMD on 07/05/2021    Prostate cancer metastatic to bone ([facility] 06/30/2021 Tumor Marker   Patient's tumor was tested for the following markers: PSA. Results of the tumor marker test revealed >3,000.   07/01/2021 Cancer Staging   Staging form: Prostate, AJCC 8th Edition - Clinical stage from 07/01/2021: Stage IVB (cTX, cNX, pM1b) - Signed by FeTruitt MerleMD on 07/05/2021    07/01/2021 Imaging   EXAM: CT CHEST, ABDOMEN, AND PELVIS WITH CONTRAST  IMPRESSION: Widespread osseous metastatic disease of unknown primary. Numerous pathologic fractures involving the ribs bilaterally as well as the T11, T12, L2, L3, and L4 vertebral bodies. Retropulsion of T11 and L3 results in severe central canal stenosis at L3. Multifactorial severe central canal stenosis also noted at L4, in part related to metastatic disease. Prominent soft tissue component involving the metastasis involving the spinous process of L5 may provide an appropriate target for tissue sampling for further evaluation.   Mild coronary artery calcification.   Thoracic aortic aneurysm with maximal transaxial dimension of 4.9 cm. Ascending thoracic aortic aneurysm. Recommend semi-annual imaging followup by CTA or MRA and referral to cardiothoracic surgery if not already obtained. This recommendation follows 2010 ACCF/AHA/AATS/ACR/ASA/SCA/SCAI/SIR/STS/SVM Guidelines for the Diagnosis and Management of Patients With Thoracic Aortic Disease. Circulation. 2010; 121: : J628-B151Aortic aneurysm NOS (ICD10-I71.9)   Moderate enlargement  of the prostate gland. Correlation with serum PSA may be helpful.   07/01/2021 Imaging   EXAM: MRI HEAD WITHOUT CONTRAST  IMPRESSION: 1. No evidence of  intracranial metastases on this noncontrast study. 2. Diffusely abnormal bone marrow signal consistent with known widespread osseous metastases.   07/01/2021 Pathology Results   FINAL MICROSCOPIC DIAGNOSIS:   A. SOFT TISSUE MASS, L5, NEEDLE CORE BIOPSY:  -  Metastatic carcinoma  -  See comment   COMMENT:  By immunohistochemistry, the neoplastic cells are positive for PSA and prostein but negative for cytokeratin 7, cytokeratin 20, CDX2 and TTF-1. Overall, the immunoprofile is consistent with a prostatic primary.   07/02/2021 Initial Diagnosis   Prostate cancer metastatic to bone ([place])   07/21/2021 - 08/03/2021 Radiation Therapy   The painful site of metastasis at L5 was treated to 30 Gy in 10 fractions of 3 Gy each.   08/03/2021 Tumor Marker   Patient's tumor was tested for the following markers: PSA. Results of the tumor marker test revealed 76.8.      INTERVAL HISTORY:  Patrick [name] is here for a follow up of metastatic prostate cancer. He was last seen by me on 08/03/21. He presents to the clinic alone. He reports a slight increase in his energy from the blood transfusion. He denies any issues from Lupron or Zytiga.  He notes he is back home from rehab. Recall he lives by himself and does everything on his own. He notes he gets groceries delivered.   All other systems were reviewed with the patient and are negative.  MEDICAL HISTORY:  Past Medical History:  Diagnosis Date   Allergy    seasonal   Arthritis    r knee   H/O inguinal hernia repair    History of tobacco use    HIV infection ([place])    Hyperlipidemia    Hypertension    Strabismic amblyopia     SURGICAL HISTORY: Past Surgical History:  Procedure Laterality Date   EYE SURGERY     strabismus   HERNIA REPAIR      I have reviewed the social history and family history with the patient and they are unchanged from previous note.  ALLERGIES:  has No Known Allergies.  MEDICATIONS:  Current Outpatient  Medications  Medication Sig Dispense Refill   abiraterone acetate (ZYTIGA) 250 MG tablet Take 4 tablets (1,000 mg total) by mouth daily. Take on an empty stomach 1 hour before or 2 hours after a meal 120 tablet 1   albuterol (VENTOLIN HFA) 108 (90 Base) MCG/ACT inhaler Inhale into the lungs.     amLODipine (NORVASC) 2.5 MG tablet Take 2.5 mg by mouth daily. (Patient not taking: Reported on 08/25/2021)     atorvastatin (LIPITOR) 40 MG tablet TAKE 1 TABLET(40 MG) BY MOUTH DAILY (Patient taking differently: Take 40 mg by mouth daily.) 90 tablet 2   calcium citrate-vitamin D (CITRACAL+D) 315-200 MG-UNIT tablet Take 1 tablet by mouth daily.     ELIQUIS 2.5 MG TABS tablet Take 2.5 mg by mouth 2 (two) times daily.     gabapentin (NEURONTIN) 100 MG capsule Take 1 capsule (100 mg total) by mouth 3 (three) times daily. 90 capsule 1   HYDROcodone-acetaminophen (NORCO/VICODIN) 5-325 MG tablet Take 1 tablet by mouth 2 (two) times daily as needed. 20 tablet 0   lidocaine (LIDODERM) 5 % Place 1 patch onto the skin daily. Remove & Discard patch within 12 hours or as directed by MD (Patient not taking:  Reported on 08/25/2021) 30 patch 0   Multiple Vitamins-Minerals (MULTIVITAMIN WITH MINERALS) tablet Take 1 tablet by mouth daily.     naproxen sodium (ALEVE) 220 MG tablet Take 220-440 mg by mouth See admin instructions. 440 mg in the morning 220 mg in the afternoon and at bedtime     ODEFSEY 200-25-25 MG TABS tablet TAKE 1 TABLET BY MOUTH EVERY DAY 90 tablet 0   predniSONE (DELTASONE) 5 MG tablet Take 1 tablet (5 mg total) by mouth daily with breakfast. 30 tablet 1   No current facility-administered medications for this visit.    PHYSICAL EXAMINATION: ECOG PERFORMANCE STATUS: 3 - Symptomatic, >50% confined to bed  Vitals:   09/02/21 1120  BP: (!) 151/87  Pulse: 80  Resp: 20  Temp: 98.4 F (36.9 C)  SpO2: 99%   Wt Readings from Last 3 Encounters:  09/02/21 164 lb 12.8 oz (74.8 kg)  08/25/21 158 lb (71.7  kg)  08/03/21 158 lb 3.2 oz (71.8 kg)     GENERAL:alert, no distress and comfortable SKIN: skin color normal, no rashes or significant lesions EYES: normal, Conjunctiva are pink and non-injected, sclera clear  NEURO: alert & oriented x 3 with fluent speech  LABORATORY DATA:  I have reviewed the data as listed CBC Latest Ref Rng & Units 09/02/2021 08/12/2021 08/03/2021  WBC 4.0 - 10.5 K/uL 2.8(L) 3.1(L) 2.7(L)  Hemoglobin 13.0 - 17.0 g/dL 9.4(L) 7.7(L) 7.8(L)  Hematocrit 39.0 - 52.0 % 28.6(L) 24.8(L) 24.7(L)  Platelets 150 - 400 K/uL 196 140(L) 148(L)     CMP Latest Ref Rng & Units 09/02/2021 08/12/2021 08/03/2021  Glucose 70 - 99 mg/dL 75 96 118(H)  BUN 8 - 23 mg/dL '17 17 14  '$ Creatinine 0.61 - 1.24 mg/dL 0.54(L) 0.49(L) 0.65  Sodium 135 - 145 mmol/L 139 139 139  Potassium 3.5 - 5.1 mmol/L 4.0 4.4 3.4(L)  Chloride 98 - 111 mmol/L 105 107 108  CO2 22 - 32 mmol/L '27 27 26  '$ Calcium 8.9 - 10.3 mg/dL 9.0 8.7(L) 8.0(L)  Total Protein 6.5 - 8.1 g/dL 6.0(L) 6.1(L) 6.1(L)  Total Bilirubin 0.3 - 1.2 mg/dL 0.5 0.5 0.3  Alkaline Phos 38 - 126 U/L 276(H) 431(H) 514(H)  AST 15 - 41 U/L '18 17 20  '$ ALT 0 - 44 U/L '14 8 11      '$ RADIOGRAPHIC STUDIES: I have personally reviewed the radiological images as listed and agreed with the findings in the report. No results found.    No orders of the defined types were placed in this encounter.  All questions were answered. The patient knows to call the clinic with any problems, questions or concerns. No barriers to learning was detected. The total time spent in the appointment was 30 minutes.     Truitt Merle, MD 09/03/2021  I, Wilburn Mylar, am acting as scribe for Truitt Merle, MD.   I have reviewed the above documentation for accuracy and completeness, and I agree with the above.

## 2021-09-03 ENCOUNTER — Encounter: Payer: Self-pay | Admitting: Hematology

## 2021-09-06 ENCOUNTER — Other Ambulatory Visit: Payer: Self-pay | Admitting: *Deleted

## 2021-09-06 DIAGNOSIS — C61 Malignant neoplasm of prostate: Secondary | ICD-10-CM

## 2021-09-06 DIAGNOSIS — M545 Low back pain, unspecified: Secondary | ICD-10-CM

## 2021-09-06 DIAGNOSIS — M8450XA Pathological fracture in neoplastic disease, unspecified site, initial encounter for fracture: Secondary | ICD-10-CM

## 2021-09-20 ENCOUNTER — Other Ambulatory Visit: Payer: Self-pay | Admitting: Hematology

## 2021-09-20 ENCOUNTER — Telehealth: Payer: Self-pay

## 2021-09-20 ENCOUNTER — Other Ambulatory Visit (HOSPITAL_COMMUNITY): Payer: Self-pay

## 2021-09-20 DIAGNOSIS — C7951 Secondary malignant neoplasm of bone: Secondary | ICD-10-CM

## 2021-09-20 MED ORDER — ABIRATERONE ACETATE 250 MG PO TABS
1000.0000 mg | ORAL_TABLET | Freq: Every day | ORAL | 1 refills | Status: DC
  Filled 2021-09-20: qty 120, 30d supply, fill #0
  Filled 2021-10-14: qty 120, 30d supply, fill #1

## 2021-09-20 MED ORDER — PREDNISONE 5 MG PO TABS
5.0000 mg | ORAL_TABLET | Freq: Every day | ORAL | 1 refills | Status: DC
  Filled 2021-09-20: qty 30, 30d supply, fill #0
  Filled 2021-10-14: qty 30, 30d supply, fill #1

## 2021-09-20 NOTE — Telephone Encounter (Signed)
Notified Patient of prior authorization approval for Hydrocodone 5/325 mg Tablets. Medication is approved from 03/37/2023 through 09/17/2022. No other needs or concerns voiced at this time. ?

## 2021-09-26 ENCOUNTER — Ambulatory Visit (HOSPITAL_COMMUNITY)
Admission: RE | Admit: 2021-09-26 | Discharge: 2021-09-26 | Disposition: A | Discharge status: Home / Self Care | Payer: PPO | Source: Ambulatory Visit | Attending: Hematology | Admitting: Hematology

## 2021-09-26 DIAGNOSIS — M545 Low back pain, unspecified: Secondary | ICD-10-CM | POA: Insufficient documentation

## 2021-09-26 DIAGNOSIS — C7951 Secondary malignant neoplasm of bone: Secondary | ICD-10-CM

## 2021-09-26 DIAGNOSIS — M8450XA Pathological fracture in neoplastic disease, unspecified site, initial encounter for fracture: Secondary | ICD-10-CM

## 2021-09-26 DIAGNOSIS — C61 Malignant neoplasm of prostate: Secondary | ICD-10-CM | POA: Diagnosis not present

## 2021-09-26 DIAGNOSIS — G8929 Other chronic pain: Secondary | ICD-10-CM | POA: Diagnosis not present

## 2021-09-26 DIAGNOSIS — S32040A Wedge compression fracture of fourth lumbar vertebra, initial encounter for closed fracture: Secondary | ICD-10-CM | POA: Diagnosis not present

## 2021-09-26 IMAGING — MR MR LUMBAR SPINE W/O CM
6 of 7 series · 31 of 48 positions shown · non-contrast
Comparison: Lumbar spine MRI [DATE]

CLINICAL DATA: Metastatic prostate cancer. Pathological fracture.
Chronic bilateral low back pain. Acute weakness.

EXAM:
MRI LUMBAR SPINE WITHOUT CONTRAST
TECHNIQUE: Multiplanar, multisequence MR imaging of the lumbar spine was
performed. No intravenous contrast was administered.

[Series 9: T2 · sagittal · 4.0mm · 0.81mm/px · 6 of 17 slices shown]
[im 1/17]
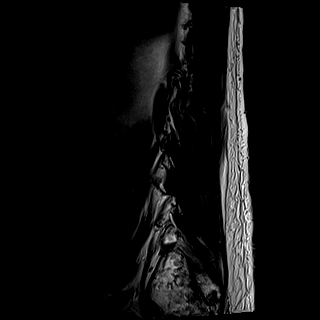
[im 4/17]
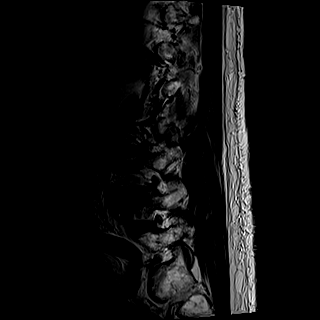
[im 7/17]
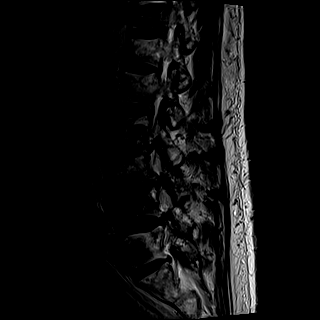
[im 10/17]
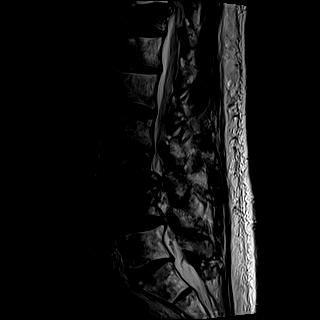
[im 13/17]
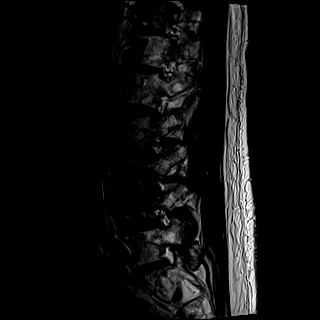
[im 17/17]
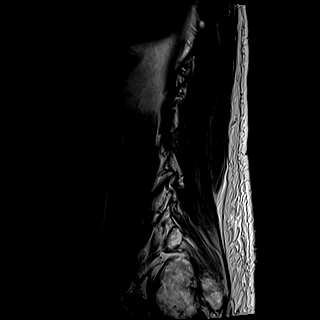

[Series 10: T1 · sagittal · 4.0mm · 0.81mm/px · 5 of 17 slices shown]
[im 1/17]
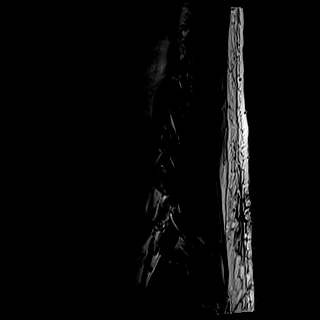
[im 5/17]
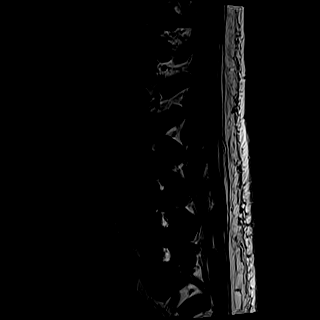
[im 9/17]
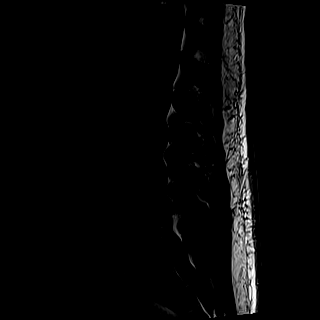
[im 13/17]
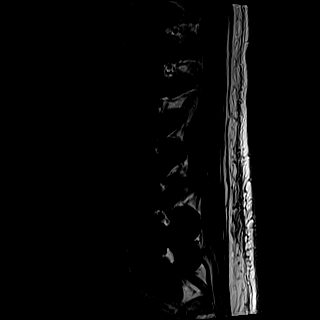
[im 17/17]
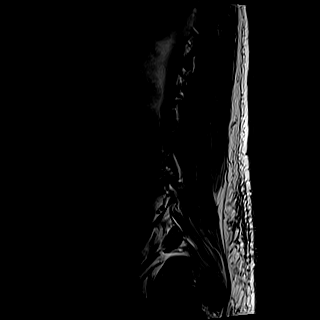

[Series 11: STIR · sagittal · 4.0mm · 0.51mm/px · 5 of 17 slices shown]
[im 1/17]
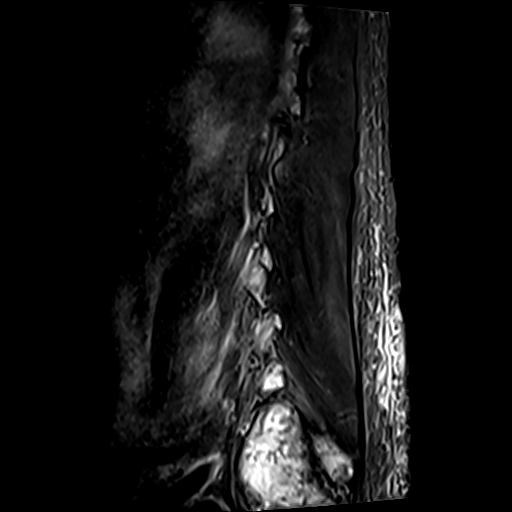
[im 5/17]
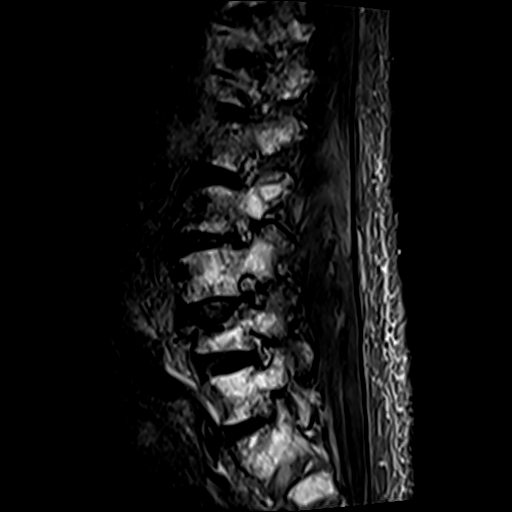
[im 9/17]
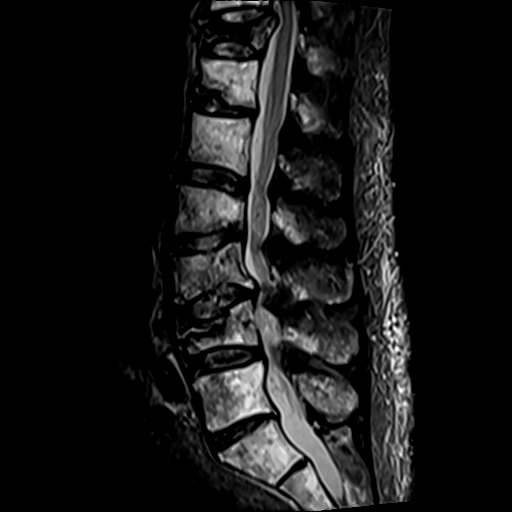
[im 13/17]
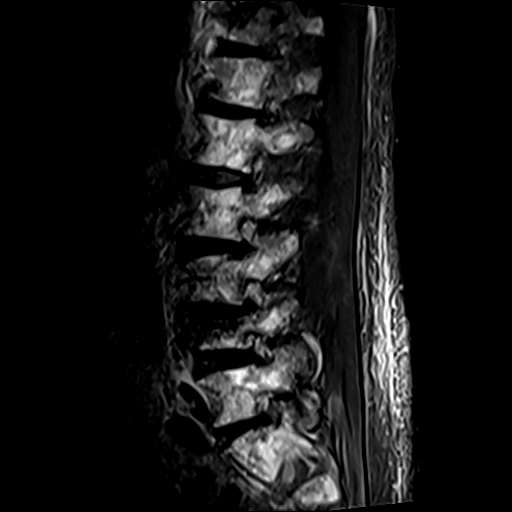
[im 17/17]
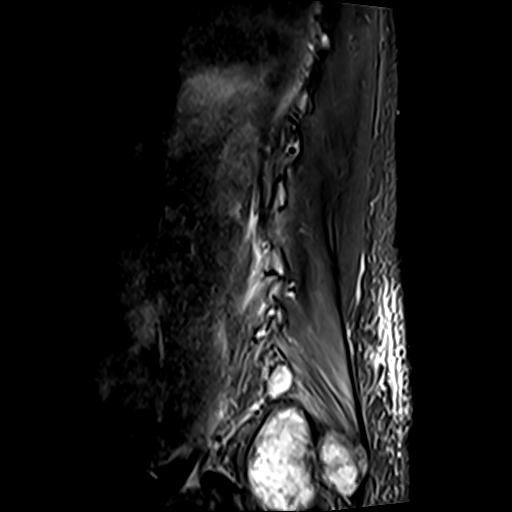

[Series 12: axial t2_upper · axial · 4.0mm · 0.62mm/px · z∈[+142,+236]mm · 6 of 20 slices shown]
[im 1/20]
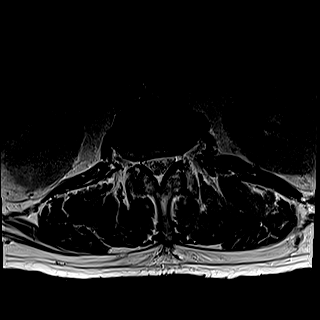
[im 4/20]
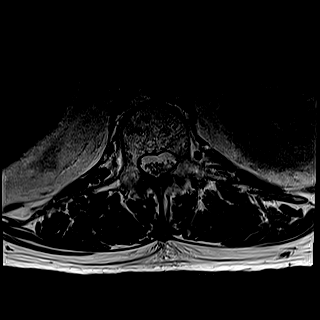
[im 8/20]
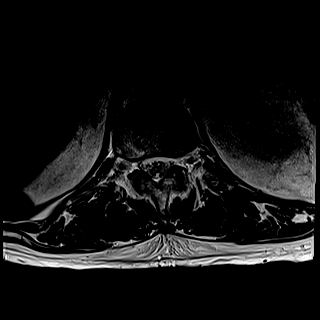
[im 12/20]
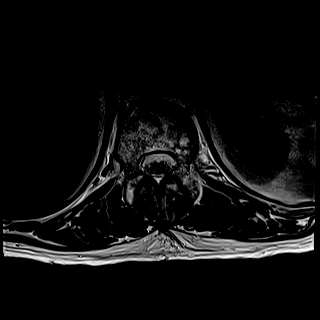
[im 16/20]
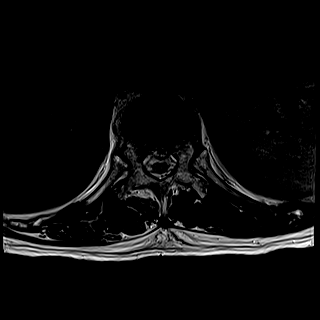
[im 20/20]
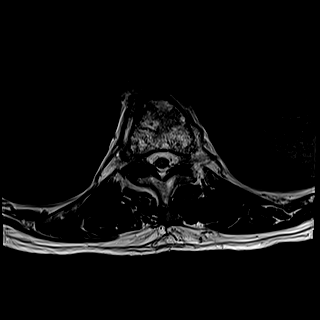

[Series 13: axial t2_lower · axial · 4.0mm · 0.62mm/px · z∈[-31,+133]mm · 8 of 34 slices shown]
[im 1/34]
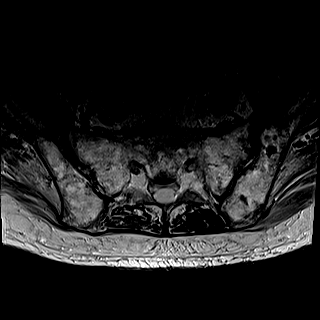
[im 4/34]
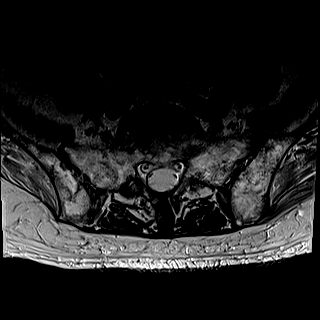
[im 12/34]
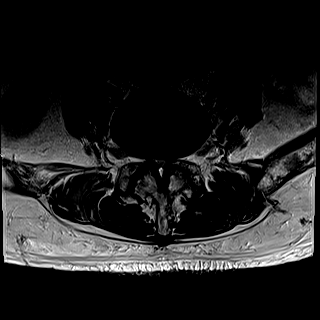
[im 15/34]
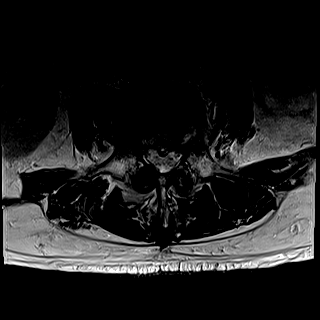
[im 19/34]
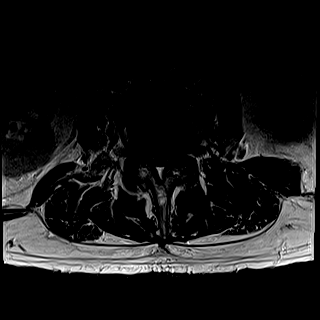
[im 23/34]
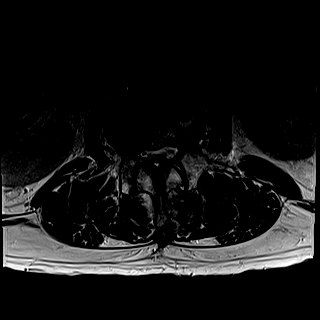
[im 30/34]
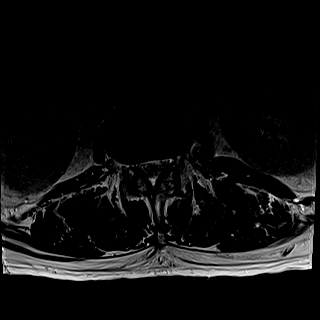
[im 34/34]
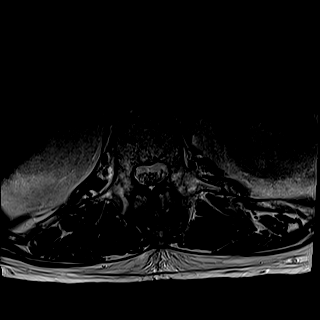

[Series 14: axial t1_upper · axial · 4.0mm · 0.39mm/px · 1 of 20 slices shown]
[im 1/20]
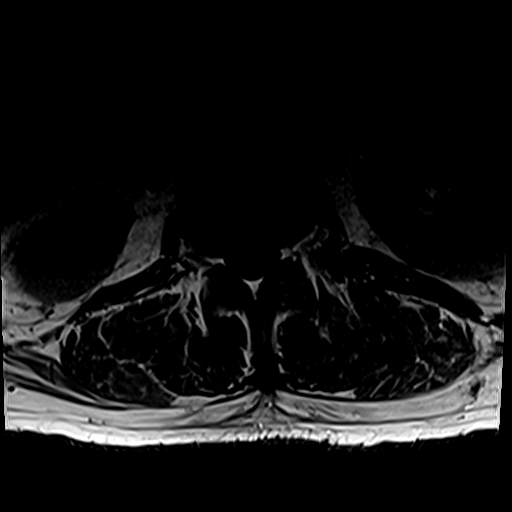

[31 of 48 positions shown; findings below may reference images not displayed]

FINDINGS: Segmentation:  Standard.

Alignment:  No significant listhesis.

Vertebrae: Diffusely abnormal bone marrow signal is again noted
including multiple focal lesions consistent with known metastatic
disease as well as background diminished T1 signal intensity which
may reflect a combination of known anemia and acute post radiation
marrow changes. Tumor involving the L5 posterior elements has
improved. No substantial epidural tumor is identified on this
unenhanced study.

T11 compression fracture with progressive height loss, now measuring
70%, with increased retropulsion now measure 4-5 mm. Unchanged mild
T12 compression fracture with superimposed inferior endplate
Schmorl's node. Unchanged L2 compression fracture with prominent
left-sided inferior endplate Schmorl's node posteriorly. Unchanged
L3 compression fracture with 5 mm retropulsion of the inferior
endplate. L4 compression fracture with mildly progressive height
loss, now measuring 45%. Assessment of the amount of marrow edema
associated with these compression fractures is limited by the
widespread underlying bone marrow signal abnormality.

Conus medullaris and cauda equina: Conus extends to the T12-L1
level. Conus and cauda equina appear normal.

Paraspinal and other soft tissues: Unchanged small right renal cysts
for which no follow-up imaging is recommended.

Disc levels:

T10-11: T11 retropulsion and facet and ligamentum flavum hypertrophy
result in mild spinal stenosis with mild ventral cord flattening and
mild bilateral neural foraminal stenosis.

T11-12: Minimal disc bulging and moderate facet and ligamentum
flavum hypertrophy without stenosis.

T12-L1: Increased, mild disc bulging and moderate facet and
ligamentum flavum hypertrophy without stenosis.

L1-2: Mild disc bulging and moderate facet and ligamentum flavum
hypertrophy result in mild spinal stenosis and mild right lateral
recess stenosis without neural foraminal stenosis, unchanged.

L2-3: Disc bulging, endplate spurring, congenitally short pedicles,
and severe facet and ligamentum flavum hypertrophy result in
moderate spinal stenosis, moderate bilateral lateral recess
stenosis, and mild right and moderate left neural foraminal
stenosis. The amount of dorsal epidural fat has decreased, and
spinal stenosis has improved.

L3-4: Disc bulging, L3 inferior endplate retropulsion, congenitally
short pedicles, and severe facet and ligamentum flavum hypertrophy
result in severe right and moderate left neural foraminal stenosis,
similar to the prior study.

L4-5: Mild disc bulging, congenitally short pedicles, and severe
facet and ligamentum flavum hypertrophy result in severe spinal
stenosis and mild bilateral neural foraminal stenosis, similar to
the prior study.

L5-S1: At the mid L5 vertebral body level, spinal canal patency has
improved following improvement of L5 posterior element tumor and
resolved epidural tumor. At the disc space level, mild disc bulging,
endplate spurring spinal or neural foraminal stenosis.
IMPRESSION: 1. Known widespread osseous metastatic disease with improvement of
L5 posterior element involvement and resolved epidural tumor.
2. Progressive height loss of the T11 and L4 compression fractures.
Unchanged T12 and L3 compression fractures.
3. Improved spinal stenosis at L2-3 and L5-S1.
4. Unchanged severe spinal stenosis at L3-4 and L4-5.

## 2021-09-27 ENCOUNTER — Other Ambulatory Visit (HOSPITAL_COMMUNITY): Payer: Self-pay

## 2021-10-02 DIAGNOSIS — M6281 Muscle weakness (generalized): Secondary | ICD-10-CM | POA: Diagnosis not present

## 2021-10-02 DIAGNOSIS — M6258 Muscle wasting and atrophy, not elsewhere classified, other site: Secondary | ICD-10-CM | POA: Diagnosis not present

## 2021-10-03 ENCOUNTER — Telehealth (HOSPITAL_COMMUNITY): Payer: Self-pay

## 2021-10-03 ENCOUNTER — Other Ambulatory Visit (HOSPITAL_COMMUNITY): Payer: Self-pay | Admitting: Neuroradiology

## 2021-10-03 NOTE — Telephone Encounter (Signed)
-----   Message from Pedro Earls, MD sent at 10/03/2021 11:13 AM EDT ----- ? ?Happy to take care of it. Adding Caryl Pina to help with scheduling and authorization. ? ?Thanks, ?Kat ? ?----- Message ----- ?From: Sandi Mariscal, MD ?Sent: 10/03/2021  10:23 AM EDT ?To: Pedro Earls, MD, Truitt Merle, MD, # ? ?Just seeing this now as I was off last week.  ? ?I think we would be a good candidate for Osteocool/KP, particularly at T11 and L4 based on the Lx spine MRI from 09/26/21. ? ?Probably the most expeditious way for this pt to be seen and treated is by Dr. Wendelyn Breslow at [facility] (cc'd on this response). ? ?Thx, ?Ulice Dash  ? ?----- Message ----- ?From: Truitt Merle, MD ?Sent: 09/28/2021   8:46 PM EDT ?To: Sandi Mariscal, MD, Estella Husk, LPN, # ? ?My nurse, please let pt know his MRI findings, he had good response to radiation, still has diffuse bone metastasis from his prostate cancer. Let him know we are waiting IR doctor to review his case and let him if he would benefit from Kyphoplasty proceed to improve his pain.  ? ?Ulice Dash, please review his recent MRI, to see if he is a good candidate for OsteoCool and Kyphoplasty. I messaged you before about this guy with metastatic prostate cancer to bones. Thanks  ? ?Truitt Merle  ? ? ?

## 2021-10-10 ENCOUNTER — Other Ambulatory Visit (HOSPITAL_COMMUNITY): Payer: Self-pay | Admitting: Neuroradiology

## 2021-10-10 DIAGNOSIS — S22080A Wedge compression fracture of T11-T12 vertebra, initial encounter for closed fracture: Secondary | ICD-10-CM

## 2021-10-10 DIAGNOSIS — S32040A Wedge compression fracture of fourth lumbar vertebra, initial encounter for closed fracture: Secondary | ICD-10-CM

## 2021-10-10 DIAGNOSIS — C7951 Secondary malignant neoplasm of bone: Secondary | ICD-10-CM

## 2021-10-10 DIAGNOSIS — C61 Malignant neoplasm of prostate: Secondary | ICD-10-CM

## 2021-10-11 ENCOUNTER — Encounter: Payer: Self-pay | Admitting: Hematology

## 2021-10-11 ENCOUNTER — Other Ambulatory Visit (HOSPITAL_COMMUNITY): Payer: Self-pay

## 2021-10-14 ENCOUNTER — Other Ambulatory Visit (HOSPITAL_COMMUNITY): Payer: Self-pay

## 2021-10-17 ENCOUNTER — Other Ambulatory Visit (HOSPITAL_COMMUNITY): Payer: Self-pay | Admitting: Radiology

## 2021-10-17 ENCOUNTER — Other Ambulatory Visit: Payer: Self-pay | Admitting: Internal Medicine

## 2021-10-18 ENCOUNTER — Observation Stay (HOSPITAL_COMMUNITY)
Admission: RE | Admit: 2021-10-18 | Discharge: 2021-10-19 | Disposition: A | Discharge status: Home / Self Care | Payer: PPO | Source: Ambulatory Visit | Attending: Neuroradiology | Admitting: Neuroradiology

## 2021-10-18 ENCOUNTER — Ambulatory Visit (HOSPITAL_COMMUNITY)
Admission: RE | Admit: 2021-10-18 | Discharge: 2021-10-18 | Disposition: A | Discharge status: Home / Self Care | Payer: PPO | Source: Ambulatory Visit | Attending: Neuroradiology | Admitting: Neuroradiology

## 2021-10-18 ENCOUNTER — Other Ambulatory Visit: Payer: Self-pay

## 2021-10-18 ENCOUNTER — Other Ambulatory Visit (HOSPITAL_COMMUNITY): Payer: Self-pay | Admitting: Neuroradiology

## 2021-10-18 ENCOUNTER — Encounter (HOSPITAL_COMMUNITY): Payer: Self-pay

## 2021-10-18 VITALS — BP 150/90 | HR 72 | Temp 98.5°F | Resp 18 | Ht 70.0 in | Wt 164.5 lb

## 2021-10-18 DIAGNOSIS — M8458XA Pathological fracture in neoplastic disease, other specified site, initial encounter for fracture: Secondary | ICD-10-CM | POA: Insufficient documentation

## 2021-10-18 DIAGNOSIS — B20 Human immunodeficiency virus [HIV] disease: Secondary | ICD-10-CM | POA: Insufficient documentation

## 2021-10-18 DIAGNOSIS — I10 Essential (primary) hypertension: Secondary | ICD-10-CM | POA: Insufficient documentation

## 2021-10-18 DIAGNOSIS — Z87891 Personal history of nicotine dependence: Secondary | ICD-10-CM | POA: Insufficient documentation

## 2021-10-18 DIAGNOSIS — S32040A Wedge compression fracture of fourth lumbar vertebra, initial encounter for closed fracture: Secondary | ICD-10-CM

## 2021-10-18 DIAGNOSIS — M25552 Pain in left hip: Secondary | ICD-10-CM | POA: Insufficient documentation

## 2021-10-18 DIAGNOSIS — Z7901 Long term (current) use of anticoagulants: Secondary | ICD-10-CM | POA: Diagnosis not present

## 2021-10-18 DIAGNOSIS — Z79899 Other long term (current) drug therapy: Secondary | ICD-10-CM | POA: Diagnosis not present

## 2021-10-18 DIAGNOSIS — M25551 Pain in right hip: Secondary | ICD-10-CM | POA: Insufficient documentation

## 2021-10-18 DIAGNOSIS — M8788 Other osteonecrosis, other site: Secondary | ICD-10-CM | POA: Diagnosis not present

## 2021-10-18 DIAGNOSIS — C61 Malignant neoplasm of prostate: Secondary | ICD-10-CM | POA: Insufficient documentation

## 2021-10-18 DIAGNOSIS — C7951 Secondary malignant neoplasm of bone: Secondary | ICD-10-CM | POA: Insufficient documentation

## 2021-10-18 DIAGNOSIS — E785 Hyperlipidemia, unspecified: Secondary | ICD-10-CM | POA: Insufficient documentation

## 2021-10-18 DIAGNOSIS — S22080A Wedge compression fracture of T11-T12 vertebra, initial encounter for closed fracture: Secondary | ICD-10-CM

## 2021-10-18 DIAGNOSIS — M898X8 Other specified disorders of bone, other site: Secondary | ICD-10-CM | POA: Diagnosis not present

## 2021-10-18 HISTORY — PX: IR KYPHO THORACIC WITH BONE BIOPSY: IMG5518

## 2021-10-18 HISTORY — PX: IR KYPHO LUMBAR INC FX REDUCE BONE BX UNI/BIL CANNULATION INC/IMAGING: IMG5519

## 2021-10-18 HISTORY — PX: IR BONE TUMOR(S)RF ABLATION: IMG2284

## 2021-10-18 HISTORY — PX: IR KYPHO EA ADDL LEVEL THORACIC OR LUMBAR: IMG5520

## 2021-10-18 LAB — CBC WITH DIFFERENTIAL/PLATELET
Abs Immature Granulocytes: 0.02 10*3/uL (ref 0.00–0.07)
Basophils Absolute: 0.1 10*3/uL (ref 0.0–0.1)
Basophils Relative: 1 %
Eosinophils Absolute: 0.4 10*3/uL (ref 0.0–0.5)
Eosinophils Relative: 11 %
HCT: 38.6 % — ABNORMAL LOW (ref 39.0–52.0)
Hemoglobin: 12.4 g/dL — ABNORMAL LOW (ref 13.0–17.0)
Immature Granulocytes: 1 %
Lymphocytes Relative: 23 %
Lymphs Abs: 1 10*3/uL (ref 0.7–4.0)
MCH: 31.6 pg (ref 26.0–34.0)
MCHC: 32.1 g/dL (ref 30.0–36.0)
MCV: 98.2 fL (ref 80.0–100.0)
Monocytes Absolute: 0.5 10*3/uL (ref 0.1–1.0)
Monocytes Relative: 12 %
Neutro Abs: 2.2 10*3/uL (ref 1.7–7.7)
Neutrophils Relative %: 52 %
Platelets: 234 10*3/uL (ref 150–400)
RBC: 3.93 MIL/uL — ABNORMAL LOW (ref 4.22–5.81)
RDW: 13.6 % (ref 11.5–15.5)
WBC: 4.2 10*3/uL (ref 4.0–10.5)
nRBC: 0 % (ref 0.0–0.2)

## 2021-10-18 LAB — BASIC METABOLIC PANEL
Anion gap: 9 (ref 5–15)
BUN: 17 mg/dL (ref 8–23)
CO2: 28 mmol/L (ref 22–32)
Calcium: 9.7 mg/dL (ref 8.9–10.3)
Chloride: 105 mmol/L (ref 98–111)
Creatinine, Ser: 0.63 mg/dL (ref 0.61–1.24)
GFR, Estimated: >60 mL/min (ref >60)
Glucose, Bld: 82 mg/dL (ref 70–99)
Potassium: 3.4 mmol/L — ABNORMAL LOW (ref 3.5–5.1)
Sodium: 142 mmol/L (ref 135–145)

## 2021-10-18 LAB — PROTIME-INR
INR: 1 (ref 0.8–1.2)
Prothrombin Time: 13.1 seconds (ref 11.4–15.2)

## 2021-10-18 IMAGING — XA IR RFA BONE TUMOR
6 of 8 series · 11 of 24 positions shown · non-contrast
Comparison: MRI of the lumbar spine [DATE].

INDICATION: SARKCESS is a 70 y.o. male with PMHs of HTN, HLD, and
recent diagnosis of metastatic prostates CA with bone involvement.
In [DATE], patient was instructed to go to ED by his PCP due
to hypotension, progressive anemia, and progressive weakness. PSA
was markedly elevated, CT CAP showed widespread osseous metastatic
disease with multiple fractures. Biopsy of soft tissue metastasis at
L5 confirmed metastatic prostate cancer. Patient undwerwent MRI of L
spine on [DATE] which showed diffuse osseous metastatic disease and
compression fx on T11, T12, L2, L3, and L4. Patient underwent
palliative radiation for T10-S1, which the patient had good
response, however continue to suffer from persistent back pain.
Repeat MRI on [DATE] showed progressive height loss of the T11 and
L4 compression fractures. A ablation of vertebral body bone lesions
and kyphoplasty was recommended to the patient for symptom relief.
After thorough discussion and shared decision making with his
oncology team, patient decided to proceed with the ablation and
kyphoplasty.

EXAM:
FLUOROSCOPY GUIDED T11 AND L4 CORE BONE BIOPSY, RADIOFREQUENCY
ABLATION AND BALOON KYPHOPLASTY
TECHNIQUE: Informed written consent was obtained from the patient after a
thorough discussion of the procedural risks, benefits and
alternatives. All questions were addressed.

[Series 8: single · 1 of 2 slices shown (1 of 5)]
[im 1/2]
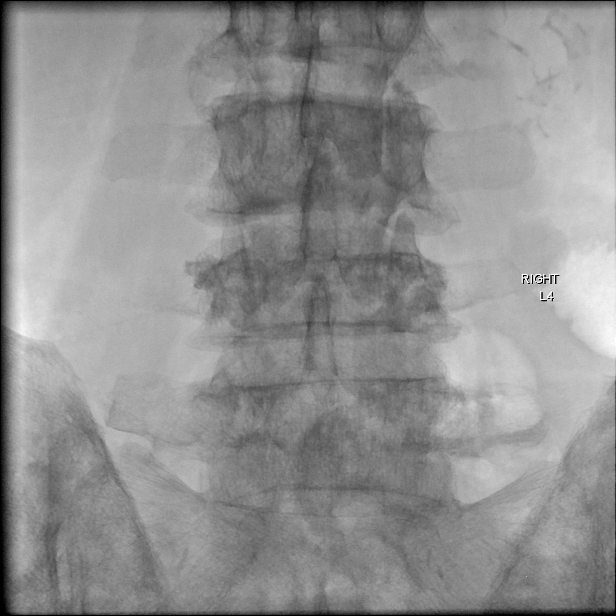

[Series 15: single · 1 of 2 slices shown (2 of 5)]
[im 2/2]
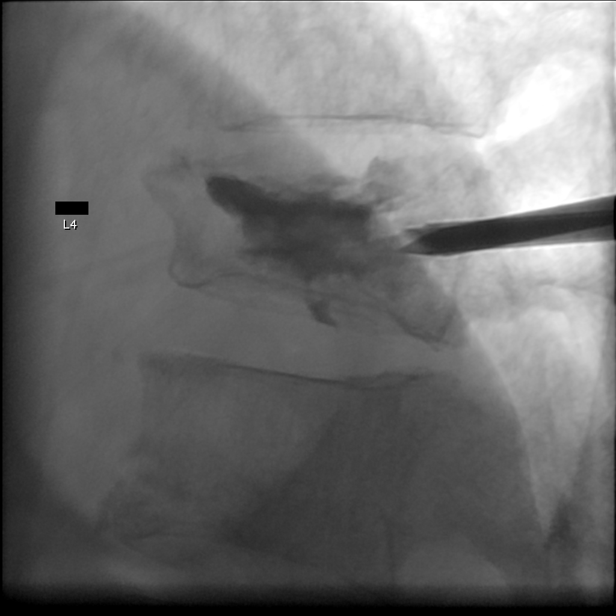

[Series 17: single · 1 of 2 slices shown (3 of 5)]
[im 2/2]
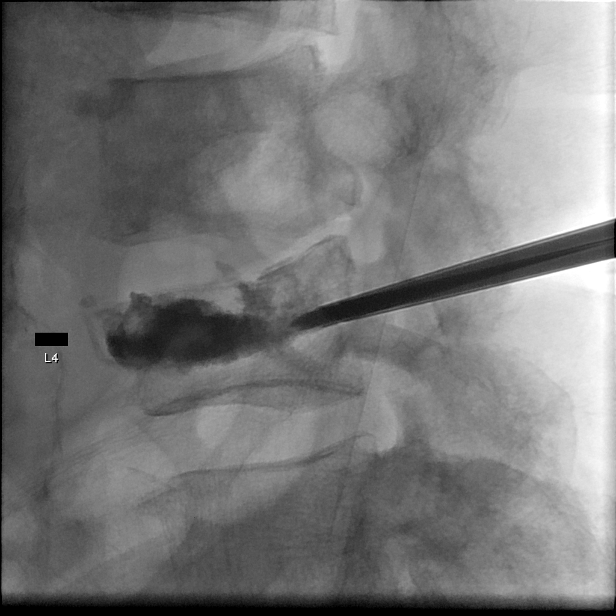

[Series 18: single · 1 of 2 slices shown (4 of 5)]
[im 2/2]
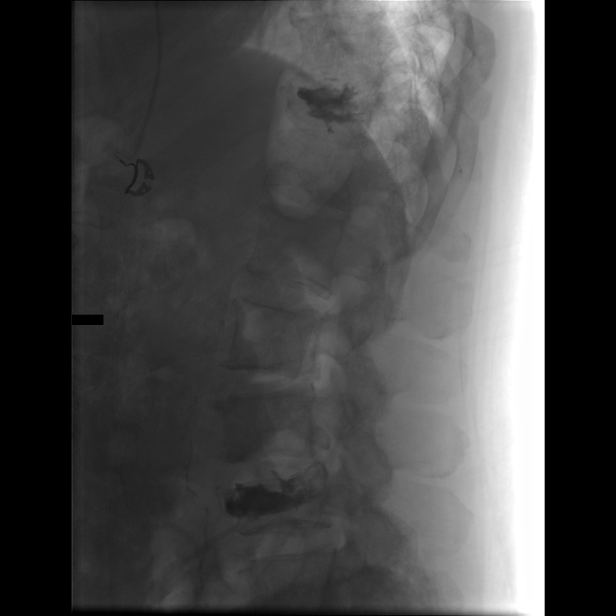

[Series 20: single · 1 of 1 slices shown (5 of 5)]
[im 1/1]
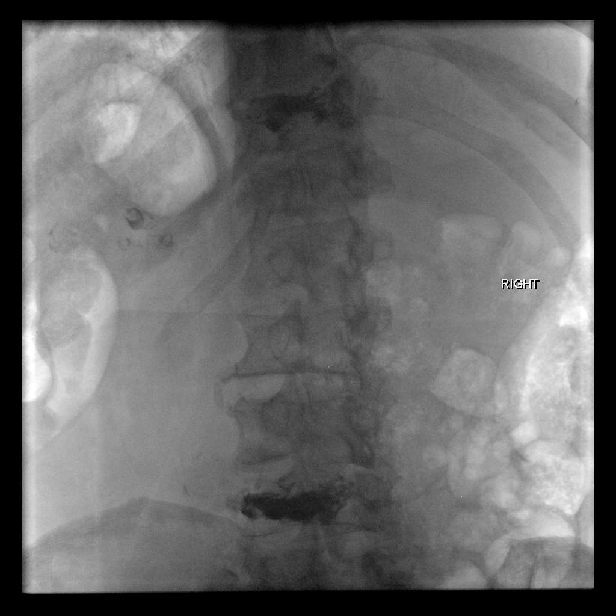

[Series 300: spine · 6 of 16 slices shown]
[im 3/16]
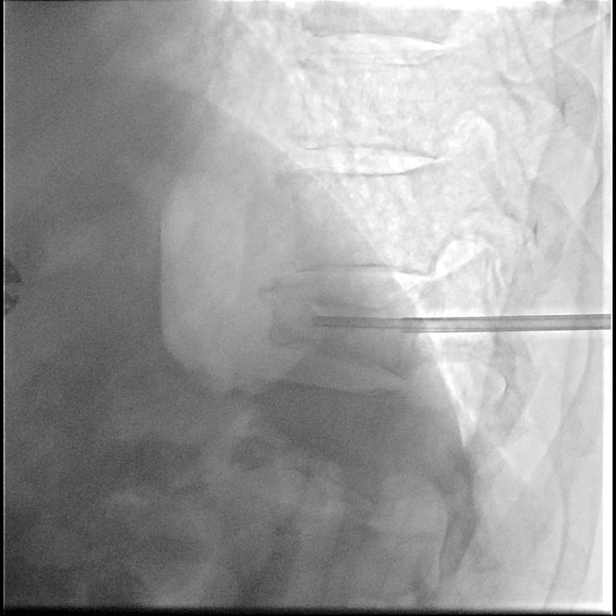
[im 5/16]
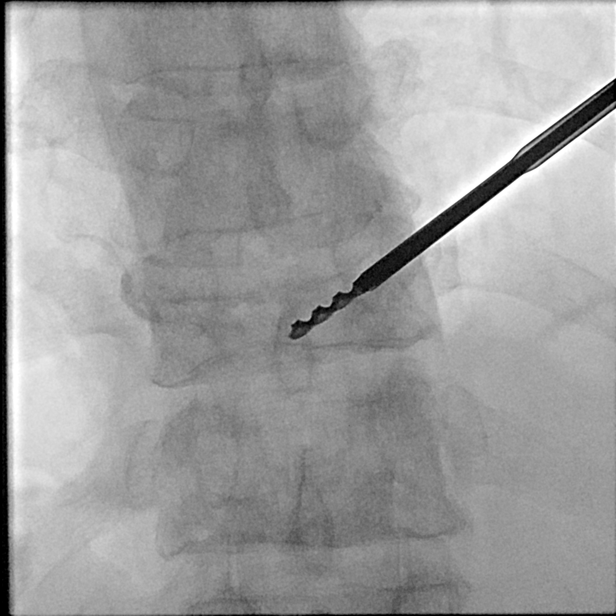
[im 7/16]
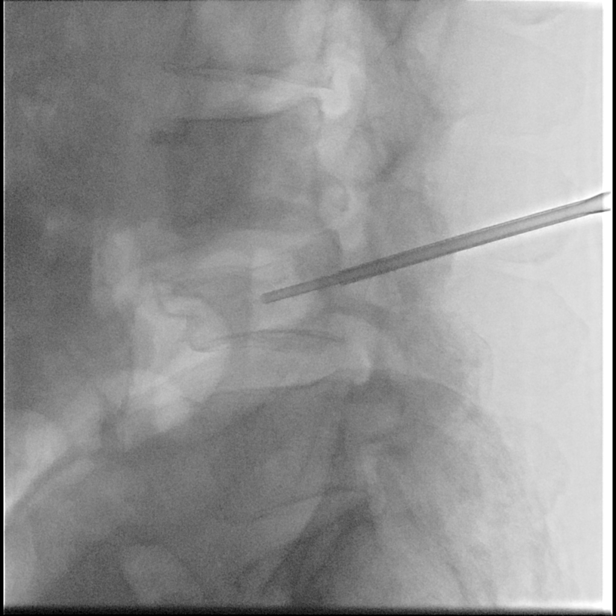
[im 10/16]
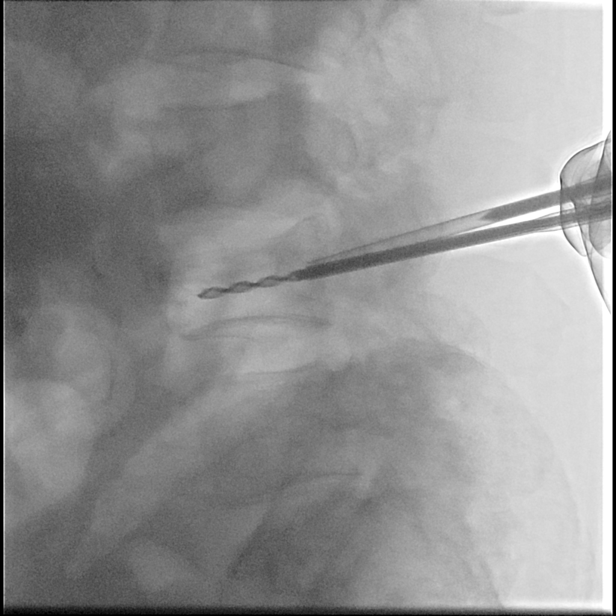
[im 12/16]
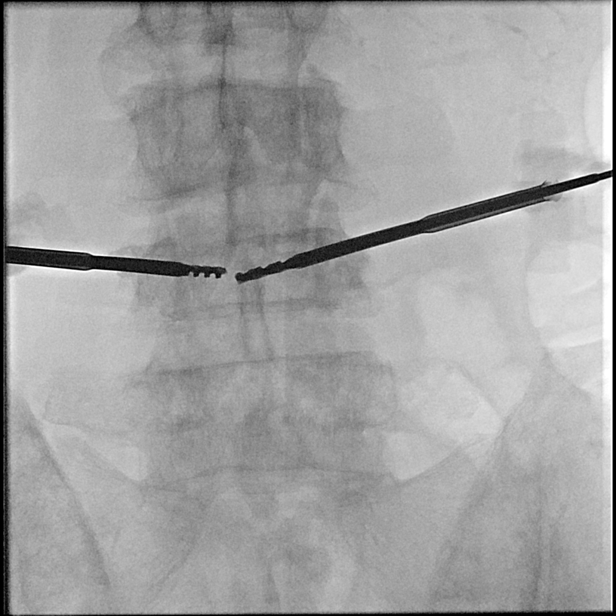
[im 14/16]
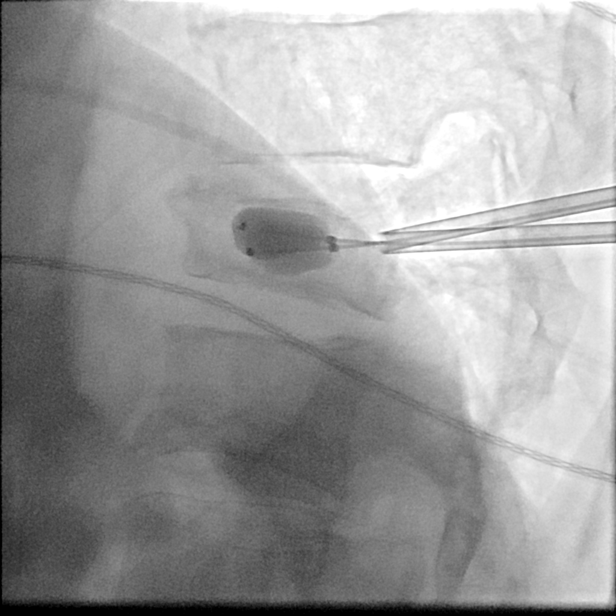

[11 of 24 positions shown; findings below may reference images not displayed]

MEDICATIONS:
Ancef 2 g IV; The antibiotic was administered in an appropriate time
interval prior to needle puncture of the skin.

3 mg of versed, 125 mcg fentanyl and 10 mg labetalol were given
intravenously.

ANESTHESIA/SEDATION:
Total intra-service Moderate Sedation Time: 119 minutes

The radiology nurse assisted in monitoring the patients level of
consciousness and vital signs continuously throughout the procedure
under my direct supervision.

FLUOROSCOPY:
Radiation Exposure Index (as provided by the fluoroscopic device):
[AN] mGy Kerma

COMPLICATIONS:
None immediate.
Maximal Sterile Barrier Technique was utilized including caps, mask,
sterile gowns, sterile gloves, sterile drape, hand hygiene and skin
antiseptic. A timeout was performed prior to the initiation of the
procedure.

The patient was placed in prone position on the angiography table.
The thoracolumbar region was prepped and draped in a sterile
fashion.

Under fluoroscopy, the T11 vertebral body was delineated and the
skin area was marked. The skin was infiltrated with a 1% Lidocaine
approximately 3.5 cm lateral to the spinous process projection on
the right. Using a 22-gauge spinal needle, the soft issue and the
peripedicular space and periosteum were infiltrated with Bupivacaine
0.5%. A skin incision was made at the access site.

Subsequently, an 11-gauge Kyphon trocar was inserted under
fluoroscopic guidance until contact with the pedicle was obtained.
The trocar was inserted into the pedicle with light SARKCESS
until the posterior boundaries of the vertebral body was reached.
The diamond mandrill was removed and one core biopsy was obtained.

The skin was infiltrated with a 1% Lidocaine approximately 3.5 cm
lateral to the spinous process projection on the left. Using a
22-gauge spinal needle, the soft issue and the peripedicular space
and periosteum were infiltrated with Bupivacaine 0.5%. A skin
incision was made at the access site. Subsequently, an 11-gauge
Kyphon trocar was inserted under fluoroscopic guidance until contact
with the pedicle was obtained. The trocar was inserted with light
SARKCESS into the pedicle until the posterior boundaries of
the vertebral body was reached.

A bone drill was coaxially advanced within the anterior third of the
vertebral body on each side for proper RF probes size selection. An
OsteoCool RF probe was inserted bilaterally within the mid-posterior
third of the vertebral body. The radiofrequency ablation cycle was
performed.

Thereafter, the RF probes were exchanged for inflatable Kyphon
balloons, which were centered within the mid-aspect of the vertebral
body. The balloons were inflated to create a void to serve as a
repository for the bone cement.

Both balloons were deflated and through both cannulas, under
continuous fluoroscopy guidance in the AP and lateral views, the
vertebral body was filled with previously mixed
polymethyl-methacrylate (PMMA) added to barium for opacification.
Both cannulas were later removed.

Under fluoroscopy, the L4 vertebral body was delineated and the skin
area was marked. The skin was infiltrated with a 1% Lidocaine
approximately 5 cm lateral to the spinous process projection on the
right. Using a 22-gauge spinal needle, the soft issue and the
peripedicular space and periosteum were infiltrated with Bupivacaine
0.5%. A skin incision was made at the access site.

Subsequently, an 11-gauge Kyphon trocar was inserted under
fluoroscopic guidance until contact with the pedicle was obtained.
The trocar was inserted into the pedicle with light SARKCESS
until the posterior boundaries of the vertebral body was reached.
The diamond mandrill was removed and one core biopsy was obtained.

The skin was infiltrated with a 1% Lidocaine approximately 4 cm
lateral to the spinous process projection on the left. Using a
22-gauge spinal needle, the soft issue and the peripedicular space
and periosteum were infiltrated with Bupivacaine 0.5%. A skin
incision was made at the access site. Subsequently, an 11-gauge
Kyphon trocar was inserted under fluoroscopic guidance until contact
with the pedicle was obtained. The trocar was inserted with light
SARKCESS into the pedicle until the posterior boundaries of
the vertebral body was reached.

A bone drill was coaxially advanced within the anterior third of the
vertebral body on each side for proper RF probes size selection. An
OsteoCool RF probe was inserted bilaterally within the mid-posterior
third of the vertebral body. The radiofrequency ablation cycle was
performed.

Thereafter, the RF probes were exchanged for inflatable Kyphon
balloons, which were centered within the mid-aspect of the vertebral
body. The balloons were inflated to create a void to serve as a
repository for the bone cement.

Both balloons were deflated and through both cannulas, under
continuous fluoroscopy guidance in the AP and lateral views, the
vertebral body was filled with previously mixed
polymethyl-methacrylate (PMMA) added to barium for opacification.
Both cannulas were later removed.

Frontal, lateral and bilateral oblique radiographs were obtained of
each level, showing good cement filling of the vertebral bodies.
FINDINGS: Expected fractures and sclerosis of the T11 and L4 vertebral bodies.
IMPRESSION: Successful core biopsy, radiofrequency ablation and balloon
kyphoplasty of T11 and L4 pathological fractures. Samples obtained
were sent for tissue exam.

## 2021-10-18 MED ORDER — FENTANYL CITRATE (PF) 100 MCG/2ML IJ SOLN
INTRAMUSCULAR | Status: AC
  Filled 2021-10-18: qty 2

## 2021-10-18 MED ORDER — PREDNISONE 5 MG PO TABS
5.0000 mg | ORAL_TABLET | Freq: Every day | ORAL | Status: DC
  Administered 2021-10-19: 5 mg via ORAL
  Filled 2021-10-18: qty 1

## 2021-10-18 MED ORDER — LABETALOL HCL 5 MG/ML IV SOLN
INTRAVENOUS | Status: AC | PRN
  Administered 2021-10-18 (×2): 5 mg via INTRAVENOUS

## 2021-10-18 MED ORDER — SODIUM CHLORIDE 0.9 % IV SOLN: INTRAVENOUS | Status: DC

## 2021-10-18 MED ORDER — LIDOCAINE HCL (PF) 1 % IJ SOLN
INTRAMUSCULAR | Status: AC
  Filled 2021-10-18: qty 30

## 2021-10-18 MED ORDER — HYDROCODONE-ACETAMINOPHEN 5-325 MG PO TABS
1.0000 | ORAL_TABLET | Freq: Four times a day (QID) | ORAL | Status: DC | PRN
  Administered 2021-10-18 – 2021-10-19 (×3): 1 via ORAL
  Filled 2021-10-18 (×3): qty 1

## 2021-10-18 MED ORDER — MIDAZOLAM HCL 2 MG/2ML IJ SOLN
INTRAMUSCULAR | Status: AC
  Filled 2021-10-18: qty 2

## 2021-10-18 MED ORDER — CEFAZOLIN SODIUM-DEXTROSE 2-4 GM/100ML-% IV SOLN
INTRAVENOUS | Status: AC
  Filled 2021-10-18: qty 100

## 2021-10-18 MED ORDER — FENTANYL CITRATE (PF) 100 MCG/2ML IJ SOLN
INTRAMUSCULAR | Status: AC | PRN
  Administered 2021-10-18 (×3): 25 ug via INTRAVENOUS
  Administered 2021-10-18: 50 ug via INTRAVENOUS

## 2021-10-18 MED ORDER — CEFAZOLIN SODIUM-DEXTROSE 2-4 GM/100ML-% IV SOLN: 2.0000 g | INTRAVENOUS | Status: DC

## 2021-10-18 MED ORDER — AMLODIPINE BESYLATE 5 MG PO TABS
2.5000 mg | ORAL_TABLET | Freq: Every day | ORAL | Status: DC
  Administered 2021-10-18 – 2021-10-19 (×2): 2.5 mg via ORAL
  Filled 2021-10-18 (×2): qty 1

## 2021-10-18 MED ORDER — LABETALOL HCL 5 MG/ML IV SOLN
INTRAVENOUS | Status: AC
  Filled 2021-10-18: qty 4

## 2021-10-18 MED ORDER — GABAPENTIN 100 MG PO CAPS
100.0000 mg | ORAL_CAPSULE | Freq: Three times a day (TID) | ORAL | Status: DC
  Administered 2021-10-18 – 2021-10-19 (×4): 100 mg via ORAL
  Filled 2021-10-18 (×4): qty 1

## 2021-10-18 MED ORDER — ATORVASTATIN CALCIUM 40 MG PO TABS
40.0000 mg | ORAL_TABLET | Freq: Every day | ORAL | Status: DC
  Administered 2021-10-18 – 2021-10-19 (×2): 40 mg via ORAL
  Filled 2021-10-18 (×2): qty 1

## 2021-10-18 MED ORDER — BUPIVACAINE HCL (PF) 0.5 % IJ SOLN
INTRAMUSCULAR | Status: AC
Start: 2021-10-18 — End: 2021-10-19
  Filled 2021-10-18: qty 30

## 2021-10-18 MED ORDER — ABIRATERONE ACETATE 250 MG PO TABS: 1000.0000 mg | ORAL_TABLET | Freq: Every day | ORAL | Status: DC

## 2021-10-18 MED ORDER — MIDAZOLAM HCL 2 MG/2ML IJ SOLN
INTRAMUSCULAR | Status: AC | PRN
  Administered 2021-10-18: 1 mg via INTRAVENOUS
  Administered 2021-10-18 (×5): .5 mg via INTRAVENOUS

## 2021-10-18 MED ORDER — BUPIVACAINE HCL (PF) 0.5 % IJ SOLN
INTRAMUSCULAR | Status: AC
Start: 2021-10-18 — End: 2021-10-18
  Filled 2021-10-18: qty 30

## 2021-10-18 MED ORDER — CEFAZOLIN SODIUM-DEXTROSE 2-4 GM/100ML-% IV SOLN
2.0000 g | INTRAVENOUS | Status: AC
  Administered 2021-10-18: 2 g via INTRAVENOUS

## 2021-10-18 MED ORDER — HYDRALAZINE HCL 20 MG/ML IJ SOLN
INTRAMUSCULAR | Status: AC
  Filled 2021-10-18: qty 1

## 2021-10-18 NOTE — Progress Notes (Signed)
Patient is s/p T11 and L4 Osteocool and KP today by Dr. Karenann Cai today. ? ?Patient seen in the patient room with Dr. Karenann Cai. ? ?Patient sitting in bed, NAD.   ?States that his back is fine, his main concern is his home medications.  ?Informed the patient that all he should be able to receive his home meds in the hospital.  ?Patient states that he wishes Eliquis to be taken off his med lists, as he has not been taking it for more than 2 months.  ? ?NIR to follow, plan for discharge tomorrow AM.  ?Please call NIR for questions and concerns.  ? ? ?Tera Mater PA-C ?10/18/2021 4:19 PM ? ? ? ?

## 2021-10-18 NOTE — H&P (Deleted)
  The note originally documented on this encounter has been moved the the encounter in which it belongs.  

## 2021-10-18 NOTE — H&P (Signed)
? ?Chief Complaint: ?Patient was seen in consultation today for vertebral body lesion ablation and kyphoplasty  at the request of de Macedo Rodrigues,[place] ? ?Referring Physician(s): Truitt Merle  ? ?Supervising Physician: Pedro Earls ? ?Patient Status: [facility] - Out-pt ? ?History of Present Illness: ?Patrick [name] is a 71 y.o. male with PMHs of HTN, HLD, and recent diagnosis of metastatic prostates CA with bone involvement. Patient is known to [place] service for previous CT guided biopsy of L5 paraspinous mass, performed by Dr. Maryelizabeth Kaufmann on 07/01/21.  ? ?In January 2023, patient was instructed to go to ED by his PCP due to hypotension, progressive anemia, and progressive weakness. PSA was markedly elevated,  CT CAP showed widespread osseous metastatic disease with multiple fractures. Biopsy of soft tissue metastasis at L5 confirmed metastatic prostate cancer.  ?Patient undwerwent MRI of L spine on 07/01/21 which showed diffuse osseous metastatic disease and compression fx on T11, T12, L2, L3, and L4. Patient underwent palliative radiation for T10-S1, which the patient had good response, however continue to suffer from persistent back pain. Repeat MRI on 09/26/21 showed progressive height loss of the T 11 and L4 compression fx. A ablation of vertebral body bone lesions and kyphoplasty was recommended to the patient for symptom relief. After thorough discussion and shared decision making with his oncology team, patient decided to proceed with the ablation and kyphoplasty.  ? ?NIR was requested for image guided vertebral body lesion ablation and kyphoplasty.  ?Case was reviewed and approved for T11 and L4 bone lesion ablation and kyphoplasty by Dr. Karenann Cai.  ? ?Patient laying in bed, not in acute distress.  ?Reports persistent bilateral hip pain.  ?Denise headache, fever, chills, shortness of breath, cough, chest pain, abdominal pain, nausea ,vomiting, and bleeding. ? ? ? ?Past Medical History:   ?Diagnosis Date  ? Allergy   ? seasonal  ? Arthritis   ? r knee  ? H/O inguinal hernia repair   ? History of tobacco use   ? HIV infection ([place])   ? Hyperlipidemia   ? Hypertension   ? Strabismic amblyopia   ? ? ?Past Surgical History:  ?Procedure Laterality Date  ? EYE SURGERY    ? strabismus  ? HERNIA REPAIR    ? ? ?Allergies: ?Patient has no known allergies. ? ?Medications: ?Prior to Admission medications   ?Medication Sig Start Date End Date Taking? Authorizing Provider  ?abiraterone acetate (ZYTIGA) 250 MG tablet Take 4 tablets (1,000 mg total) by mouth daily. Take on an empty stomach 1 hour before or 2 hours after a meal 09/20/21  Yes Truitt Merle, MD  ?albuterol (VENTOLIN HFA) 108 (90 Base) MCG/ACT inhaler Inhale into the lungs. 07/27/21  Yes [provider]  ?atorvastatin (LIPITOR) 40 MG tablet TAKE 1 TABLET(40 MG) BY MOUTH DAILY ?Patient taking differently: Take 40 mg by mouth daily. 01/28/21  Yes Gaylan Gerold, DO  ?calcium citrate-vitamin D (CITRACAL+D) 315-200 MG-UNIT tablet Take 1 tablet by mouth daily.   Yes [provider]  ?gabapentin (NEURONTIN) 100 MG capsule Take 1 capsule (100 mg total) by mouth 3 (three) times daily. 09/02/21  Yes Truitt Merle, MD  ?HYDROcodone-acetaminophen (NORCO/VICODIN) 5-325 MG tablet Take 1 tablet by mouth 2 (two) times daily as needed. 09/02/21  Yes Truitt Merle, MD  ?Multiple Vitamins-Minerals (MULTIVITAMIN WITH MINERALS) tablet Take 1 tablet by mouth daily.   Yes [provider]  ?naproxen sodium (ALEVE) 220 MG tablet Take 220-440 mg by mouth See admin instructions.  440 mg in the morning ?220 mg in the afternoon and at bedtime   Yes [provider]  ?predniSONE (DELTASONE) 5 MG tablet Take 1 tablet (5 mg total) by mouth daily with breakfast. 09/20/21  Yes Truitt Merle, MD  ?amLODipine (NORVASC) 2.5 MG tablet Take 2.5 mg by mouth daily. ?Patient not taking: Reported on 08/25/2021 07/08/21   [provider]  ?ELIQUIS 2.5 MG TABS tablet Take 2.5 mg by  mouth 2 (two) times daily. 07/27/21   [provider]  ?lidocaine (LIDODERM) 5 % Place 1 patch onto the skin daily. Remove & Discard patch within 12 hours or as directed by MD ?Patient not taking: Reported on 08/25/2021 07/06/21   Dorethea Clan, DO  ?ODEFSEY 200-25-25 MG TABS tablet TAKE 1 TABLET BY MOUTH EVERY DAY 07/26/21   Campbell Riches, MD  ?  ? ?Family History  ?Problem Relation Age of Onset  ? Colon polyps Mother   ? Heart disease Mother   ? Heart disease Father   ? Kidney disease Father   ? Lung cancer Father   ? Diabetes Maternal Grandmother   ? Colon cancer Neg Hx   ? ? ?Social History  ? ?Socioeconomic History  ? Marital status: Single  ?  Spouse name: Not on file  ? Number of children: Not on file  ? Years of education: Not on file  ? Highest education level: Not on file  ?Occupational History  ? Not on file  ?Tobacco Use  ? Smoking status: Former  ? Smokeless tobacco: Former  ?  Types: Chew  ?  Quit date: 1998  ?Vaping Use  ? Vaping Use: Never used  ?Substance and Sexual Activity  ? Alcohol use: Yes  ?  Alcohol/week: 1.0 standard drink  ?  Types: 1 Cans of beer per week  ?  Comment: occ  ? Drug use: No  ? Sexual activity: Never  ?  Comment: pt. declined condoms  ?Other Topics Concern  ? Not on file  ?Social History Narrative  ? Not on file  ? ?Social Determinants of Health  ? ?Financial Resource Strain: Not on file  ?Food Insecurity: Not on file  ?Transportation Needs: Not on file  ?Physical Activity: Not on file  ?Stress: Not on file  ?Social Connections: Not on file  ? ? ? ?Review of Systems: A 12 point ROS discussed and pertinent positives are indicated in the HPI above.  All other systems are negative. ? ?Vital Signs: ?There were no vitals taken for this visit. ? ? ?Physical Exam ?Vitals reviewed.  ?Constitutional:   ?   General: He is not in acute distress. ?   Appearance: Normal appearance. He is not ill-appearing.  ?HENT:  ?   Head: Normocephalic.  ?   Mouth/Throat:  ?   Mouth: Mucous  membranes are moist.  ?Cardiovascular:  ?   Rate and Rhythm: Normal rate and regular rhythm.  ?   Heart sounds: Normal heart sounds.  ?Pulmonary:  ?   Effort: Pulmonary effort is normal.  ?   Breath sounds: Normal breath sounds.  ?Abdominal:  ?   General: Abdomen is flat. Bowel sounds are normal.  ?   Palpations: Abdomen is soft.  ?Musculoskeletal:  ?   Comments: Bilateral hip pain.  ?Skin: ?   General: Skin is warm and dry.  ?Neurological:  ?   Mental Status: He is alert and oriented to person, place, and time.  ?Psychiatric:     ?  Mood and Affect: Mood normal.     ?   Behavior: Behavior normal.     ?   Judgment: Judgment normal.  ? ? ?MD Evaluation ?Airway: WNL ?Heart: WNL ?Abdomen: WNL ?Chest/ Lungs: WNL ?ASA  Classification: 3 ?Mallampati/Airway Score: Two ? ?Imaging: ?MR Lumbar Spine Wo Contrast ? ?Result Date: 09/27/2021 ?CLINICAL DATA:  Metastatic prostate cancer. Pathological fracture. Chronic bilateral low back pain. Acute weakness. EXAM: MRI LUMBAR SPINE WITHOUT CONTRAST TECHNIQUE: Multiplanar, multisequence MR imaging of the lumbar spine was performed. No intravenous contrast was administered. COMPARISON:  Lumbar spine MRI 07/01/2021 FINDINGS: Segmentation:  Standard. Alignment:  No significant listhesis. Vertebrae: Diffusely abnormal bone marrow signal is again noted including multiple focal lesions consistent with known metastatic disease as well as background diminished T1 signal intensity which may reflect a combination of known anemia and acute post radiation marrow changes. Tumor involving the L5 posterior elements has improved. No substantial epidural tumor is identified on this unenhanced study. T11 compression fracture with progressive height loss, now measuring 70%, with increased retropulsion now measure 4-5 mm. Unchanged mild T12 compression fracture with superimposed inferior endplate Schmorl's node. Unchanged L2 compression fracture with prominent left-sided inferior endplate Schmorl's node  posteriorly. Unchanged L3 compression fracture with 5 mm retropulsion of the inferior endplate. L4 compression fracture with mildly progressive height loss, now measuring 45%. Assessment of the amount

## 2021-10-18 NOTE — Procedures (Signed)
INTERVENTIONAL NEURORADIOLOGY BRIEF POSTPROCEDURE NOTE ? ?FLUOROSCOPY GUIDED T11 AND L4 CORE BIOPSY, RFA AND KYPHOPLASTY ? ?Attending: Dr. Pedro Earls ? ?Assistant: Dr. Frazier Richards ? ?Diagnosis: Pathological fractures ? ?Access site: Percutaneous ? ?Anesthesia: Moderate sedation. ? ?Medication used: 3.5 mg Versed IV; 125 mcg Fentanyl IV. ? ?Complications: None. ? ?Estimated blood loss: None. ? ?Specimen: T11 and L4 core biopsies. ? ?Findings: Pathological compression fractures. Core biopsies obtained followed by RFA and kyphoplasty.  ? ?The patient tolerated the procedure well without incident or complication and is in stable condition.  ? ?PLAN: ?- Admitted for overnight observation. Anticipated discharge tomorrow. ? ?  ?

## 2021-10-19 ENCOUNTER — Encounter (HOSPITAL_COMMUNITY): Payer: Self-pay

## 2021-10-19 DIAGNOSIS — M8458XA Pathological fracture in neoplastic disease, other specified site, initial encounter for fracture: Secondary | ICD-10-CM | POA: Diagnosis not present

## 2021-10-19 NOTE — TOC Transition Note (Signed)
Transition of Care (TOC) - CM/SW Discharge Note ? ? ?Patient Details  ?Name: Patrick [name] ?MRN: [id number] ?Date of Birth: [birth year] ? ?Transition of Care (TOC) CM/SW Contact:  ?Tom-Johnson, Renea Ee, RN ?Phone Number: ?10/19/2021, 3:49 PM ? ? ?Clinical Narrative:    ? ?Patient is scheduled for discharge today. Admitted for Prostate Cancer mets to Bone. Had Kyphoplasty with ablation on 10/18/21. Has a walker and shower seat at home. No PT/OT recommendations noted. Denies any TOC needs. Family to transport at discharge. No further TOC needs noted. ? ?Final next level of care: Home/Self Care ?Barriers to Discharge: Barriers Resolved ? ? ?Patient Goals and CMS Choice ?Patient states their goals for this hospitalization and ongoing recovery are:: To return home ?CMS Medicare.gov Compare Post Acute Care list provided to:: Patient ?Choice offered to / list presented to : NA ? ?Discharge Placement ?  ?           ?  ?Patient to be transferred to facility by: Family ?  ?  ? ?Discharge Plan and Services ?  ?  ?           ?DME Arranged: N/A ?DME Agency: NA ?  ?  ?  ?HH Arranged: NA ?[place] Agency: NA ?  ?  ?  ? ?Social Determinants of Health (SDOH) Interventions ?  ? ? ?Readmission Risk Interventions ?   ? View : No data to display.  ?  ?  ?  ? ? ? ? ? ?

## 2021-10-19 NOTE — Discharge Instructions (Signed)
May remove bandages in 2-3 days. ?IR scheduler will contact you to schedule 2 week follow up appointment at [place] may use ibuprofen '600mg'$  or acetaminophen '1000mg'$  every 6-8 hours as needed for mild to moderate pain. ?Avoid long car rides and prolonged sitting during the first two weeks after your procedure. ?Call [phone] or [phone] with questions or concerns. ? ? ?

## 2021-10-19 NOTE — Discharge Summary (Signed)
? ? ?  Patient ID: ?Patrick [name] ?MRN: [id number] ?DOB/AGE: 10-13-1950 71 y.o. ? ?Admit date: 10/18/2021 ?Discharge date: 10/19/2021 ? ?Supervising Physician: Pedro Earls ? ?Patient Status: [facility] - In-pt ? ?Admission Diagnoses: Vertebral fracture ? ?Discharge Diagnoses:  ?Active Problems: ?  * No active hospital problems. * ? ? ?Discharged Condition: good ? ?Hospital Course: Mr Patrick [name] presented to [facility] Interventional Radiology for planned Kyphoplasty and Osteocool procedure at T11 and L4 yesterday, 10/18/21.  His procedure was successful and uneventful.  He stayed overnight for observation.  His pain has significantly improved and is well managed on oral medications.   ? ?Consults: None ? ?Significant Diagnostic Studies: radiology: See procedure notes ? ?Treatments: Kyphoplasty and osteocool ? ?Discharge Exam: ?Blood pressure 133/90, pulse 78, temperature 98.4 ?F (36.9 ?C), temperature source Oral, resp. rate 18, height '5\' 10"'$  (1.778 m), weight 164 lb 7.4 oz (74.6 kg), SpO2 98 %. ?General appearance: alert, cooperative, and no distress ?Resp: Non labored breathing ?Skin: Skin color, texture, turgor normal. No rashes or lesions ?Neurologic: Grossly normal ? ?Disposition:  ?S/p kyphoplasty and osteocool of T11 and L4.  Doing well.  OP f/u request placed.  Ready to d/c. ? ? ?  ? ?Electronically Signed: ?Pasty Spillers, PA ?10/19/2021, 12:14 PM ? ? ?I have spent Greater Than 30 Minutes discharging Patrick [name]. ? ? ?  ?

## 2021-10-19 NOTE — Progress Notes (Signed)
Patrick [name] to be discharged Home per MD order. Discussed prescriptions and follow up appointments with the patient. Prescriptions given to patient; medication list explained in detail. Patient verbalized understanding. ? ?Skin clean, dry and intact without evidence of skin break down, no evidence of skin tears noted. IV catheter discontinued intact. Site without signs and symptoms of complications. Dressing and pressure applied. Pt denies pain at the site currently. No complaints noted. ? ?Changed the dressings on the back before discharge. ? ?Patient free of lines, drains, and wounds.  ? ?An After Visit Summary (AVS) was printed and given to the patient. ?Patient escorted via wheelchair, and discharged home via private auto. ? ?Amaryllis Dyke, RN  ?

## 2021-10-21 ENCOUNTER — Ambulatory Visit (HOSPITAL_COMMUNITY): Payer: PPO

## 2021-10-21 LAB — SURGICAL PATHOLOGY

## 2021-10-26 ENCOUNTER — Inpatient Hospital Stay: Payer: PPO

## 2021-10-26 ENCOUNTER — Inpatient Hospital Stay (HOSPITAL_BASED_OUTPATIENT_CLINIC_OR_DEPARTMENT_OTHER): Payer: PPO | Admitting: Hematology

## 2021-10-26 ENCOUNTER — Encounter: Payer: Self-pay | Admitting: Hematology

## 2021-10-26 ENCOUNTER — Inpatient Hospital Stay: Payer: PPO | Attending: Hematology

## 2021-10-26 ENCOUNTER — Other Ambulatory Visit (HOSPITAL_COMMUNITY): Payer: Self-pay

## 2021-10-26 VITALS — BP 162/93 | HR 80 | Temp 98.8°F | Resp 18 | Ht 70.0 in | Wt 167.8 lb

## 2021-10-26 DIAGNOSIS — C61 Malignant neoplasm of prostate: Secondary | ICD-10-CM | POA: Diagnosis not present

## 2021-10-26 DIAGNOSIS — C7951 Secondary malignant neoplasm of bone: Secondary | ICD-10-CM

## 2021-10-26 DIAGNOSIS — G893 Neoplasm related pain (acute) (chronic): Secondary | ICD-10-CM | POA: Insufficient documentation

## 2021-10-26 DIAGNOSIS — D6959 Other secondary thrombocytopenia: Secondary | ICD-10-CM | POA: Insufficient documentation

## 2021-10-26 DIAGNOSIS — D630 Anemia in neoplastic disease: Secondary | ICD-10-CM | POA: Insufficient documentation

## 2021-10-26 DIAGNOSIS — D638 Anemia in other chronic diseases classified elsewhere: Secondary | ICD-10-CM

## 2021-10-26 DIAGNOSIS — Z5111 Encounter for antineoplastic chemotherapy: Secondary | ICD-10-CM | POA: Insufficient documentation

## 2021-10-26 LAB — CMP (CANCER CENTER ONLY)
ALT: 14 U/L (ref 0–44)
AST: 21 U/L (ref 15–41)
Albumin: 4.2 g/dL (ref 3.5–5.0)
Alkaline Phosphatase: 118 U/L (ref 38–126)
Anion gap: 7 (ref 5–15)
BUN: 22 mg/dL (ref 8–23)
CO2: 29 mmol/L (ref 22–32)
Calcium: 9.5 mg/dL (ref 8.9–10.3)
Chloride: 104 mmol/L (ref 98–111)
Creatinine: 0.6 mg/dL — ABNORMAL LOW (ref 0.61–1.24)
GFR, Estimated: >60 mL/min (ref >60)
Glucose, Bld: 90 mg/dL (ref 70–99)
Potassium: 4.2 mmol/L (ref 3.5–5.1)
Sodium: 140 mmol/L (ref 135–145)
Total Bilirubin: 0.6 mg/dL (ref 0.3–1.2)
Total Protein: 7 g/dL (ref 6.5–8.1)

## 2021-10-26 LAB — CBC WITH DIFFERENTIAL/PLATELET
Abs Immature Granulocytes: 0.02 10*3/uL (ref 0.00–0.07)
Basophils Absolute: 0 10*3/uL (ref 0.0–0.1)
Basophils Relative: 1 %
Eosinophils Absolute: 0.2 10*3/uL (ref 0.0–0.5)
Eosinophils Relative: 4 %
HCT: 36.4 % — ABNORMAL LOW (ref 39.0–52.0)
Hemoglobin: 11.9 g/dL — ABNORMAL LOW (ref 13.0–17.0)
Immature Granulocytes: 1 %
Lymphocytes Relative: 15 %
Lymphs Abs: 0.6 10*3/uL — ABNORMAL LOW (ref 0.7–4.0)
MCH: 31.5 pg (ref 26.0–34.0)
MCHC: 32.7 g/dL (ref 30.0–36.0)
MCV: 96.3 fL (ref 80.0–100.0)
Monocytes Absolute: 0.4 10*3/uL (ref 0.1–1.0)
Monocytes Relative: 8 %
Neutro Abs: 3 10*3/uL (ref 1.7–7.7)
Neutrophils Relative %: 71 %
Platelets: 206 10*3/uL (ref 150–400)
RBC: 3.78 MIL/uL — ABNORMAL LOW (ref 4.22–5.81)
RDW: 13.1 % (ref 11.5–15.5)
WBC: 4.2 10*3/uL (ref 4.0–10.5)
nRBC: 0 % (ref 0.0–0.2)

## 2021-10-26 LAB — FERRITIN: Ferritin: 143 ng/mL (ref 24–336)

## 2021-10-26 MED ORDER — LEUPROLIDE ACETATE (3 MONTH) 22.5 MG ~~LOC~~ KIT: 22.5000 mg | PACK | Freq: Once | SUBCUTANEOUS | Status: DC

## 2021-10-26 MED ORDER — PREDNISONE 5 MG PO TABS
5.0000 mg | ORAL_TABLET | Freq: Every day | ORAL | 2 refills | Status: DC
  Filled 2021-10-26: qty 30, 30d supply, fill #0
  Filled 2021-11-17: qty 30, 30d supply, fill #1
  Filled 2021-12-21: qty 30, 30d supply, fill #2

## 2021-10-26 MED ORDER — SODIUM CHLORIDE 0.9 % IV SOLN: Freq: Once | INTRAVENOUS | Status: AC

## 2021-10-26 MED ORDER — ZOLEDRONIC ACID 4 MG/100ML IV SOLN
4.0000 mg | Freq: Once | INTRAVENOUS | Status: AC
  Administered 2021-10-26: 4 mg via INTRAVENOUS
  Filled 2021-10-26: qty 100

## 2021-10-26 MED ORDER — ABIRATERONE ACETATE 250 MG PO TABS
1000.0000 mg | ORAL_TABLET | Freq: Every day | ORAL | 2 refills | Status: DC
  Filled 2021-10-26: qty 120, 30d supply, fill #0
  Filled 2021-11-17: qty 120, 30d supply, fill #1
  Filled 2021-12-21: qty 120, 30d supply, fill #2

## 2021-10-26 MED ORDER — LEUPROLIDE ACETATE (3 MONTH) 22.5 MG ~~LOC~~ KIT
22.5000 mg | PACK | Freq: Once | SUBCUTANEOUS | Status: AC
  Administered 2021-10-26: 22.5 mg via SUBCUTANEOUS
  Filled 2021-10-26: qty 22.5

## 2021-10-26 NOTE — Progress Notes (Signed)
?[place]   ?[phone] [phone]   ?Clinic Follow up Note  ? ?Patient Care Team: ?Lajean Manes, MD as PCP - General ?Campbell Riches, MD as PCP - Infectious Diseases (Infectious Diseases) ?Katheren Puller, RN as Oncology Nurse Navigator ? ?Date of Service:  10/26/2021 ? ?CHIEF COMPLAINT: f/u of metastatic prostate cancer ? ?CURRENT THERAPY:  ?-Lupron, started 07/06/21, now q76month ?-Zometa, q329month starting 07/06/21 ?-Zytiga 1000 mg daily and prednisone 10 mg, starting in 07/2021 ? ?ASSESSMENT & PLAN:  ?Patrick [name] is a [age].o. male with  ? ?1. 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 PSA showed extreme elevation of >3,000. CT CAP showed widespread osseous metastatic disease with multiple fractures. Biopsy of soft tissue metastasis at L5 confirmed metastatic prostate cancer.  ?-he started casodex on 1/10, and Lupron and Zometa on 07/06/21 in the hospital. ?-he received palliative radiation to L5 under Dr. MaTammi Klippel/26-08/03/21 ?-he started Zytiga '1000mg'$  with prednisone '10mg'$  in mid 07/2021, tolerating very well  ?-his PSA has responded well, coming down to 76.8 on 08/03/21. Today's results are pending. ?-he is tolerating Zytiga and Eligard well. Labs reviewed, overall stable to improving. He will continue injection every 3 months. ?  ?2. Anemia, Thrombocytopenia ?-secondary to bone metastases, no lab evidence of nutritional anemia ?-due to his metastatic cancer, I will hold on ESA ?-he received 1u RBC on 08/13/21 ?-improved-- hgb 11.9, plt WNL at 206k today (10/26/21) ?  ?3. Bone metastasis and pain  ?-he received palliative RT to T10-SI joints under Dr. MaTammi Klippel/26-08/03/21 ?-Zometa started on 07/06/21, will continue every 3 months. He is overdue for his second dose. ?-I recommend he take/continue calcium and vit D. ?-He is on hydrocodone twice a day, is  controlled. ?-post-treatment lumbar spine MRI on 09/26/21 showed improvement to L5 and progressive height loss of T11 and L4 compression fractures. ?-he underwent kyphoplasty on 10/18/21. Path showed necrosis, malignancy was not excluded. ?  ?  ?PLAN: ?-proceed with Lupron injection and Zometa infusion today ?-continue Zytiga 1000 mg and prednisone 10 mg daily, I refilled both today ?-lab, f/u, Zometa, and Eligard in 3 months ? ? ?No problem-specific Assessment & Plan notes found for this encounter. ? ? ?SUMMARY OF ONCOLOGIC HISTORY: ?Oncology History Overview Note  ? Cancer Staging  ?[facility]?Staging form: Prostate, AJCC 8th Edition ?- Clinical stage from 07/01/2021: Stage IVB (cTX, cNX, pM1b) - Signed by FeTruitt MerleMD on 07/05/2021 ? ?  ?[facility] ?[facility]21 Tumor Marker  ? Patient's tumor was tested for the following markers: PSA. ?Results of the tumor marker test revealed >3,000. ?  ?07/01/2021 Cancer Staging  ? Staging form: Prostate, AJCC 8th Edition ?- Clinical stage from 07/01/2021: Stage IVB (cTX, cNX, pM1b) - Signed by FeTruitt MerleMD on 07/05/2021 ? ?  ?07/01/2021 Imaging  ? EXAM: ?CT CHEST, ABDOMEN, AND PELVIS WITH CONTRAST ? ?IMPRESSION: ?Widespread osseous metastatic disease of unknown primary. Numerous pathologic fractures involving the ribs bilaterally as well as the T11, T12, L2, L3, and L4 vertebral bodies. Retropulsion of T11 and L3 results in severe central canal stenosis at L3. Multifactorial severe central canal stenosis also noted at L4, in part related to metastatic disease. Prominent soft tissue component involving the metastasis involving the spinous process of L5 may provide an appropriate target for tissue sampling for further evaluation. ?  ?  Mild coronary artery calcification. ?  ?Thoracic aortic aneurysm with maximal transaxial dimension of 4.9 ?cm. Ascending thoracic aortic aneurysm. Recommend semi-annual ?imaging followup by CTA or MRA  and referral to cardiothoracic ?surgery if not already obtained. This recommendation follows 2010 ?ACCF/AHA/AATS/ACR/ASA/SCA/SCAI/SIR/STS/SVM Guidelines for the Diagnosis and Management of Patients With Thoracic Aortic Disease. Circulation. 2010; 121: Z366-Y403. Aortic aneurysm NOS (ICD10-I71.9) ?  ?Moderate enlargement of the prostate gland. Correlation with serum PSA may be helpful. ?  ?07/01/2021 Imaging  ? EXAM: ?MRI HEAD WITHOUT CONTRAST ? ?IMPRESSION: ?1. No evidence of intracranial metastases on this noncontrast study. ?2. Diffusely abnormal bone marrow signal consistent with known ?widespread osseous metastases. ?  ?07/01/2021 Pathology Results  ? FINAL MICROSCOPIC DIAGNOSIS:  ? ?A. SOFT TISSUE MASS, L5, NEEDLE CORE BIOPSY:  ?-  Metastatic carcinoma  ?-  See comment  ? ?COMMENT:  ?By immunohistochemistry, the neoplastic cells are positive for PSA and prostein but negative for cytokeratin 7, cytokeratin 20, CDX2 and TTF-1. Overall, the immunoprofile is consistent with a prostatic primary. ?  ?07/02/2021 Initial Diagnosis  ? Prostate cancer metastatic to bone [facility]) ? ?  ?07/21/2021 - 08/03/2021 Radiation Therapy  ? The painful site of metastasis at L5 was treated to 30 Gy in 10 fractions of 3 Gy each. ?  ?08/03/2021 Tumor Marker  ? Patient's tumor was tested for the following markers: PSA. ?Results of the tumor marker test revealed 76.8. ?  ? ? ? ?INTERVAL HISTORY:  ?Patrick [name] is here for a follow up of metastatic prostate cancer. He was last seen by me on 09/02/21. He presents to the clinic alone. He notes he drove himself here. ?He reports improvement since his kyphoplasty. He notes he can stand for longer periods of time now. He also notes he is continuing exercises at home.  ?He reports he has had no recurrent hot flashes since his last visit. ?  ?All other systems were reviewed with the patient and are negative. ? ?MEDICAL HISTORY:  ?Past Medical History:  ?Diagnosis Date  ? Allergy   ? seasonal  ? Arthritis    ? r knee  ? H/O inguinal hernia repair   ? History of tobacco use   ? HIV infection ([place])   ? Hyperlipidemia   ? Hypertension   ? Strabismic amblyopia   ? ? ?SURGICAL HISTORY: ?Past Surgical History:  ?Procedure Laterality Date  ? EYE SURGERY    ? strabismus  ? HERNIA REPAIR    ? IR BONE TUMOR(S)RF ABLATION  10/18/2021  ? IR BONE TUMOR(S)RF ABLATION  10/18/2021  ? IR KYPHO EA ADDL LEVEL THORACIC OR LUMBAR  10/18/2021  ? IR KYPHO LUMBAR INC FX REDUCE BONE BX UNI/BIL CANNULATION INC/IMAGING  10/18/2021  ? ? ?I have reviewed the social history and family history with the patient and they are unchanged from previous note. ? ?ALLERGIES:  has No Known Allergies. ? ?MEDICATIONS:  ?Current Outpatient Medications  ?Medication Sig Dispense Refill  ? abiraterone acetate (ZYTIGA) 250 MG tablet Take 4 tablets (1,000 mg total) by mouth daily. Take on an empty stomach 1 hour before or 2 hours after a meal 120 tablet 2  ? albuterol (VENTOLIN HFA) 108 (90 Base) MCG/ACT inhaler Inhale 2 puffs into the lungs every 6 (six) hours as needed for shortness of breath or wheezing.    ? amLODipine (NORVASC) 2.5 MG tablet Take 2.5 mg by mouth daily. (Patient not taking: Reported on 08/25/2021)    ? atorvastatin (LIPITOR) 40 MG tablet TAKE  1 TABLET(40 MG) BY MOUTH DAILY (Patient taking differently: Take 40 mg by mouth daily.) 90 tablet 2  ? calcium citrate-vitamin D (CITRACAL+D) 315-200 MG-UNIT tablet Take 1 tablet by mouth daily.    ? gabapentin (NEURONTIN) 100 MG capsule Take 1 capsule (100 mg total) by mouth 3 (three) times daily. 90 capsule 1  ? HYDROcodone-acetaminophen (NORCO/VICODIN) 5-325 MG tablet Take 1 tablet by mouth 2 (two) times daily as needed. 20 tablet 0  ? lidocaine (LIDODERM) 5 % Place 1 patch onto the skin daily. Remove & Discard patch within 12 hours or as directed by MD (Patient not taking: Reported on 08/25/2021) 30 patch 0  ? Multiple Vitamins-Minerals (MULTIVITAMIN WITH MINERALS) tablet Take 1 tablet by mouth daily.    ?  naproxen sodium (ALEVE) 220 MG tablet Take 220-440 mg by mouth See admin instructions. 440 mg in the morning ?220 mg in the afternoon and at bedtime    ? ODEFSEY 200-25-25 MG TABS tablet TAKE 1 TABLET BY MOUTH EVERY DAY

## 2021-10-26 NOTE — Patient Instructions (Signed)
Zoledronic Acid Injection (Cancer) ?What is this medication? ?ZOLEDRONIC ACID (ZOE le dron ik AS id) treats high calcium levels in the blood caused by cancer. It may also be used with chemotherapy to treat weakened bones caused by cancer. It works by slowing down the release of calcium from bones. This lowers calcium levels in your blood. It also makes your bones stronger and less likely to break (fracture). It belongs to a group of medications called bisphosphonates. ?This medicine may be used for other purposes; ask your health care provider or pharmacist if you have questions. ?COMMON BRAND NAME(S): Zometa, Zometa Powder ?What should I tell my care team before I take this medication? ?They need to know if you have any of these conditions: ?Dehydration ?Dental disease ?Kidney disease ?Liver disease ?Low levels of calcium in the blood ?Lung or breathing disease, such as asthma ?Receiving steroids, such as dexamethasone or prednisone ?An unusual or allergic reaction to zoledronic acid, other medications, foods, dyes, or preservatives ?Pregnant or trying to get pregnant ?Breast-feeding ?How should I use this medication? ?This medication is injected into a vein. It is given by your care team in a hospital or clinic setting. ?Talk to your care team about the use of this medication in children. Special care may be needed. ?Overdosage: If you think you have taken too much of this medicine contact a poison control center or emergency room at once. ?NOTE: This medicine is only for you. Do not share this medicine with others. ?What if I miss a dose? ?Keep appointments for follow-up doses. It is important not to miss your dose. Call your care team if you are unable to keep an appointment. ?What may interact with this medication? ?Certain antibiotics given by injection ?Diuretics, such as bumetanide, furosemide ?NSAIDs, medications for pain and inflammation, such as ibuprofen or naproxen ?Teriparatide ?Thalidomide ?This list  may not describe all possible interactions. Give your health care provider a list of all the medicines, herbs, non-prescription drugs, or dietary supplements you use. Also tell them if you smoke, drink alcohol, or use illegal drugs. Some items may interact with your medicine. ?What should I watch for while using this medication? ?Visit your care team for regular checks on your progress. It may be some time before you see the benefit from this medication. ?Some people who take this medication have severe bone, joint, or muscle pain. This medication may also increase your risk for jaw problems or a broken thigh bone. Tell your care team right away if you have severe pain in your jaw, bones, joints, or muscles. Tell you care team if you have any pain that does not go away or that gets worse. ?Tell your dentist and dental surgeon that you are taking this medication. You should not have major dental surgery while on this medication. See your dentist to have a dental exam and fix any dental problems before starting this medication. Take good care of your teeth while on this medication. Make sure you see your dentist for regular follow-up appointments. ?You should make sure you get enough calcium and vitamin D while you are taking this medication. Discuss the foods you eat and the vitamins you take with your care team. ?Check with your care team if you have severe diarrhea, nausea, and vomiting, or if you sweat a lot. The loss of too much body fluid may make it dangerous for you to take this medication. ?You may need bloodwork while taking this medication. ?Talk to your care team if  you wish to become pregnant or think you might be pregnant. This medication can cause serious birth defects. ?What side effects may I notice from receiving this medication? ?Side effects that you should report to your care team as soon as possible: ?Allergic reactions--skin rash, itching, hives, swelling of the face, lips, tongue, or  throat ?Kidney injury--decrease in the amount of urine, swelling of the ankles, hands, or feet ?Low calcium level--muscle pain or cramps, confusion, tingling, or numbness in the hands or feet ?Osteonecrosis of the jaw--pain, swelling, or redness in the mouth, numbness of the jaw, poor healing after dental work, unusual discharge from the mouth, visible bones in the mouth ?Severe bone, joint, or muscle pain ?Side effects that usually do not require medical attention (report to your care team if they continue or are bothersome): ?Constipation ?Fatigue ?Fever ?Loss of appetite ?Nausea ?Stomach pain ?This list may not describe all possible side effects. Call your doctor for medical advice about side effects. You may report side effects to FDA at 1-800-FDA-1088. ?Where should I keep my medication? ?This medication is given in a hospital or clinic. It will not be stored at home. ?NOTE: This sheet is a summary. It may not cover all possible information. If you have questions about this medicine, talk to your doctor, pharmacist, or health care provider. ?? 2023 Elsevier/Gold Standard (2021-08-05 00:00:00) ? ? ?Leuprolide depot injection ?What is this medication? ?LEUPROLIDE (loo PROE lide) is a man-made protein that acts like a natural hormone in the body. It decreases testosterone in men and decreases estrogen in women. In men, this medicine is used to treat advanced prostate cancer. In women, some forms of this medicine may be used to treat endometriosis, uterine fibroids, or other male hormone-related problems. ?This medicine may be used for other purposes; ask your health care provider or pharmacist if you have questions. ?COMMON BRAND NAME(S): Eligard, Fensolv, Lupron Depot, Lupron Depot-Ped, Viadur ?What should I tell my care team before I take this medication? ?They need to know if you have any of these conditions: ?diabetes ?heart disease or previous heart attack ?high blood pressure ?high cholesterol ?mental  illness ?osteoporosis ?pain or difficulty passing urine ?seizures ?spinal cord metastasis ?stroke ?suicidal thoughts, plans, or attempt; a previous suicide attempt by you or a family member ?tobacco smoker ?unusual vaginal bleeding (women) ?an unusual or allergic reaction to leuprolide, benzyl alcohol, other medicines, foods, dyes, or preservatives ?pregnant or trying to get pregnant ?breast-feeding ?How should I use this medication? ?This medicine is for injection into a muscle or for injection under the skin. It is given by a health care professional in a hospital or clinic setting. The specific product will determine how it will be given to you. Make sure you understand which product you receive and how often you will receive it. ?Talk to your pediatrician regarding the use of this medicine in children. Special care may be needed. ?Overdosage: If you think you have taken too much of this medicine contact a poison control center or emergency room at once. ?NOTE: This medicine is only for you. Do not share this medicine with others. ?What if I miss a dose? ?It is important not to miss a dose. Call your doctor or health care professional if you are unable to keep an appointment. ?Depot injections: Depot injections are given either once-monthly, every 12 weeks, every 16 weeks, or every 24 weeks depending on the product you are prescribed. The product you are prescribed will be based on if you  are male or male, and your condition. Make sure you understand your product and dosing. ?What may interact with this medication? ?Do not take this medicine with any of the following medications: ?chasteberry ?cisapride ?dronedarone ?pimozide ?thioridazine ?This medicine may also interact with the following medications: ?herbal or dietary supplements, like black cohosh or DHEA ?male hormones, like estrogens or progestins and birth control pills, patches, rings, or injections ?male hormones, like testosterone ?other medicines  that prolong the QT interval (abnormal heart rhythm) ?This list may not describe all possible interactions. Give your health care provider a list of all the medicines, herbs, non-prescription drugs, or dietary supp

## 2021-10-27 ENCOUNTER — Telehealth: Payer: Self-pay | Admitting: Hematology

## 2021-10-27 LAB — PSA, TOTAL AND FREE
PSA, Free Pct: 46 %
PSA, Free: 0.23 ng/mL
Prostate Specific Ag, Serum: 0.5 ng/mL (ref 0.0–4.0)

## 2021-10-27 NOTE — Telephone Encounter (Signed)
Scheduled follow-up appointment per 5/3 los. Patient is aware. 

## 2021-10-28 ENCOUNTER — Other Ambulatory Visit (HOSPITAL_COMMUNITY): Payer: Self-pay

## 2021-10-28 LAB — TESTOSTERONE, FREE, TOTAL, SHBG
Sex Hormone Binding: 46.9 nmol/L (ref 19.3–76.4)
Testosterone, Free: <0.2 pg/mL — ABNORMAL LOW (ref 6.6–18.1)
Testosterone: <3 ng/dL — ABNORMAL LOW (ref 264–916)

## 2021-10-31 ENCOUNTER — Other Ambulatory Visit: Payer: Self-pay | Admitting: Hematology

## 2021-10-31 ENCOUNTER — Other Ambulatory Visit (HOSPITAL_COMMUNITY): Payer: Self-pay

## 2021-11-01 DIAGNOSIS — M6258 Muscle wasting and atrophy, not elsewhere classified, other site: Secondary | ICD-10-CM | POA: Diagnosis not present

## 2021-11-01 DIAGNOSIS — M6281 Muscle weakness (generalized): Secondary | ICD-10-CM | POA: Diagnosis not present

## 2021-11-08 ENCOUNTER — Other Ambulatory Visit (HOSPITAL_COMMUNITY): Payer: Self-pay

## 2021-11-17 ENCOUNTER — Other Ambulatory Visit (HOSPITAL_COMMUNITY): Payer: Self-pay

## 2021-11-24 ENCOUNTER — Other Ambulatory Visit (HOSPITAL_COMMUNITY): Payer: Self-pay

## 2021-11-30 ENCOUNTER — Other Ambulatory Visit: Payer: Self-pay | Admitting: Infectious Diseases

## 2021-11-30 ENCOUNTER — Other Ambulatory Visit (HOSPITAL_COMMUNITY): Payer: Self-pay

## 2021-11-30 DIAGNOSIS — B20 Human immunodeficiency virus [HIV] disease: Secondary | ICD-10-CM

## 2021-12-02 DIAGNOSIS — M6281 Muscle weakness (generalized): Secondary | ICD-10-CM | POA: Diagnosis not present

## 2021-12-02 DIAGNOSIS — M6258 Muscle wasting and atrophy, not elsewhere classified, other site: Secondary | ICD-10-CM | POA: Diagnosis not present

## 2021-12-10 ENCOUNTER — Other Ambulatory Visit: Payer: Self-pay | Admitting: Student

## 2021-12-10 DIAGNOSIS — E7800 Pure hypercholesterolemia, unspecified: Secondary | ICD-10-CM

## 2021-12-12 NOTE — Telephone Encounter (Signed)
Patient has not been seen in Clinics and has no upcoming appointments.

## 2021-12-13 ENCOUNTER — Other Ambulatory Visit (HOSPITAL_COMMUNITY): Payer: Self-pay

## 2021-12-14 ENCOUNTER — Encounter: Payer: Self-pay | Admitting: Infectious Diseases

## 2021-12-17 ENCOUNTER — Encounter: Payer: Self-pay | Admitting: *Deleted

## 2021-12-21 ENCOUNTER — Telehealth: Payer: Self-pay

## 2021-12-21 ENCOUNTER — Other Ambulatory Visit (HOSPITAL_COMMUNITY): Payer: Self-pay

## 2021-12-21 NOTE — Telephone Encounter (Signed)
This nurse received a message from patient stating that MD spoke with him about increasing Gabapentin from 100 mg  three times daily to 200 mg and he would like to know if change can be made.  This request was forwarded to MD no further questions or concerns at this time.

## 2021-12-23 ENCOUNTER — Encounter: Payer: Self-pay | Admitting: Hematology

## 2021-12-23 ENCOUNTER — Other Ambulatory Visit: Payer: Self-pay

## 2021-12-23 DIAGNOSIS — C61 Malignant neoplasm of prostate: Secondary | ICD-10-CM

## 2021-12-23 MED ORDER — GABAPENTIN 100 MG PO CAPS: 200.0000 mg | ORAL_CAPSULE | Freq: Three times a day (TID) | ORAL | 1 refills | Status: DC

## 2021-12-29 ENCOUNTER — Other Ambulatory Visit: Payer: Self-pay | Admitting: Hematology

## 2021-12-29 DIAGNOSIS — C61 Malignant neoplasm of prostate: Secondary | ICD-10-CM

## 2021-12-30 ENCOUNTER — Other Ambulatory Visit (HOSPITAL_COMMUNITY): Payer: Self-pay

## 2022-01-01 DIAGNOSIS — M6258 Muscle wasting and atrophy, not elsewhere classified, other site: Secondary | ICD-10-CM | POA: Diagnosis not present

## 2022-01-01 DIAGNOSIS — M6281 Muscle weakness (generalized): Secondary | ICD-10-CM | POA: Diagnosis not present

## 2022-01-19 ENCOUNTER — Inpatient Hospital Stay (HOSPITAL_BASED_OUTPATIENT_CLINIC_OR_DEPARTMENT_OTHER): Payer: PPO | Admitting: Hematology

## 2022-01-19 ENCOUNTER — Inpatient Hospital Stay: Payer: PPO

## 2022-01-19 ENCOUNTER — Other Ambulatory Visit (HOSPITAL_COMMUNITY): Payer: Self-pay

## 2022-01-19 ENCOUNTER — Inpatient Hospital Stay: Payer: PPO | Attending: Hematology

## 2022-01-19 ENCOUNTER — Encounter: Payer: Self-pay | Admitting: Hematology

## 2022-01-19 ENCOUNTER — Other Ambulatory Visit: Payer: Self-pay

## 2022-01-19 VITALS — BP 155/96 | HR 90 | Temp 98.7°F | Resp 18 | Ht 70.0 in | Wt 174.3 lb

## 2022-01-19 DIAGNOSIS — I10 Essential (primary) hypertension: Secondary | ICD-10-CM | POA: Insufficient documentation

## 2022-01-19 DIAGNOSIS — C7951 Secondary malignant neoplasm of bone: Secondary | ICD-10-CM | POA: Insufficient documentation

## 2022-01-19 DIAGNOSIS — C61 Malignant neoplasm of prostate: Secondary | ICD-10-CM

## 2022-01-19 DIAGNOSIS — D638 Anemia in other chronic diseases classified elsewhere: Secondary | ICD-10-CM

## 2022-01-19 DIAGNOSIS — D649 Anemia, unspecified: Secondary | ICD-10-CM | POA: Diagnosis not present

## 2022-01-19 DIAGNOSIS — Z5111 Encounter for antineoplastic chemotherapy: Secondary | ICD-10-CM | POA: Diagnosis not present

## 2022-01-19 LAB — COMPREHENSIVE METABOLIC PANEL
ALT: 13 U/L (ref 0–44)
AST: 19 U/L (ref 15–41)
Albumin: 4.4 g/dL (ref 3.5–5.0)
Alkaline Phosphatase: 78 U/L (ref 38–126)
Anion gap: 8 (ref 5–15)
BUN: 16 mg/dL (ref 8–23)
CO2: 26 mmol/L (ref 22–32)
Calcium: 9.3 mg/dL (ref 8.9–10.3)
Chloride: 104 mmol/L (ref 98–111)
Creatinine, Ser: 0.62 mg/dL (ref 0.61–1.24)
GFR, Estimated: >60 mL/min (ref >60)
Glucose, Bld: 103 mg/dL — ABNORMAL HIGH (ref 70–99)
Potassium: 3.8 mmol/L (ref 3.5–5.1)
Sodium: 138 mmol/L (ref 135–145)
Total Bilirubin: 0.7 mg/dL (ref 0.3–1.2)
Total Protein: 6.8 g/dL (ref 6.5–8.1)

## 2022-01-19 LAB — CBC WITH DIFFERENTIAL/PLATELET
Abs Immature Granulocytes: 0.01 10*3/uL (ref 0.00–0.07)
Basophils Absolute: 0 10*3/uL (ref 0.0–0.1)
Basophils Relative: 1 %
Eosinophils Absolute: 0.1 10*3/uL (ref 0.0–0.5)
Eosinophils Relative: 1 %
HCT: 35.7 % — ABNORMAL LOW (ref 39.0–52.0)
Hemoglobin: 12.7 g/dL — ABNORMAL LOW (ref 13.0–17.0)
Immature Granulocytes: 0 %
Lymphocytes Relative: 13 %
Lymphs Abs: 0.7 10*3/uL (ref 0.7–4.0)
MCH: 32.4 pg (ref 26.0–34.0)
MCHC: 35.6 g/dL (ref 30.0–36.0)
MCV: 91.1 fL (ref 80.0–100.0)
Monocytes Absolute: 0.4 10*3/uL (ref 0.1–1.0)
Monocytes Relative: 7 %
Neutro Abs: 4.5 10*3/uL (ref 1.7–7.7)
Neutrophils Relative %: 78 %
Platelets: 185 10*3/uL (ref 150–400)
RBC: 3.92 MIL/uL — ABNORMAL LOW (ref 4.22–5.81)
RDW: 13.5 % (ref 11.5–15.5)
WBC: 5.7 10*3/uL (ref 4.0–10.5)
nRBC: 0 % (ref 0.0–0.2)

## 2022-01-19 LAB — FERRITIN: Ferritin: 104 ng/mL (ref 24–336)

## 2022-01-19 MED ORDER — SODIUM CHLORIDE 0.9 % IV SOLN: Freq: Once | INTRAVENOUS | Status: AC

## 2022-01-19 MED ORDER — PREDNISONE 5 MG PO TABS
5.0000 mg | ORAL_TABLET | Freq: Every day | ORAL | 2 refills | Status: DC
  Filled 2022-01-19 – 2022-01-20 (×2): qty 30, 30d supply, fill #0
  Filled 2022-02-20: qty 30, 30d supply, fill #1

## 2022-01-19 MED ORDER — LEUPROLIDE ACETATE (3 MONTH) 22.5 MG ~~LOC~~ KIT
22.5000 mg | PACK | Freq: Once | SUBCUTANEOUS | Status: AC
  Administered 2022-01-19: 22.5 mg via SUBCUTANEOUS
  Filled 2022-01-19: qty 22.5

## 2022-01-19 MED ORDER — ABIRATERONE ACETATE 250 MG PO TABS
1000.0000 mg | ORAL_TABLET | Freq: Every day | ORAL | 2 refills | Status: DC
  Filled 2022-01-19 – 2022-01-20 (×2): qty 120, 30d supply, fill #0
  Filled 2022-02-20: qty 120, 30d supply, fill #1

## 2022-01-19 MED ORDER — CELECOXIB 200 MG PO CAPS
200.0000 mg | ORAL_CAPSULE | Freq: Two times a day (BID) | ORAL | 1 refills | Status: DC
  Filled 2022-01-19: qty 30, 15d supply, fill #0
  Filled 2022-02-02: qty 30, 15d supply, fill #1

## 2022-01-19 MED ORDER — GABAPENTIN 100 MG PO CAPS
200.0000 mg | ORAL_CAPSULE | Freq: Three times a day (TID) | ORAL | 1 refills | Status: DC
  Filled 2022-01-19: qty 180, 20d supply, fill #0
  Filled 2022-02-09: qty 180, 20d supply, fill #1

## 2022-01-19 MED ORDER — ZOLEDRONIC ACID 4 MG/100ML IV SOLN
4.0000 mg | Freq: Once | INTRAVENOUS | Status: AC
  Administered 2022-01-19: 4 mg via INTRAVENOUS
  Filled 2022-01-19: qty 100

## 2022-01-19 NOTE — Progress Notes (Signed)
[name]   [phone] [phone]   Clinic Follow up Note   Patient Care Team: Leigh Aurora, DO as PCP - General [name] [name], MD as PCP - Infectious Diseases (Infectious Diseases) Katheren Puller, RN as Oncology Nurse Navigator  Date of Service:  01/19/2022  CHIEF COMPLAINT: f/u of metastatic prostate cancer  CURRENT THERAPY:  -Lupron, started 07/06/21, now q22month -Zometa, q343month starting 07/06/21 -Zytiga 1000 mg daily and prednisone 10 mg, starting in 07/2021  ASSESSMENT & PLAN:  Patrick [name] is a [age].o. male with   1. 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 PSA showed extreme elevation of >3,000. CT CAP showed widespread osseous metastatic disease with multiple fractures. Biopsy of soft tissue metastasis at L5 confirmed metastatic prostate cancer.  -he started casodex on 1/10, and Lupron and Zometa on 07/06/21 in the hospital, and Zytiga '1000mg'$  with prednisone '10mg'$  in mid 07/2021, tolerating very well. -his PSA has responded well, coming down to 0.5 on 10/26/21. Today's result is pending. -he is clinically doing well and tolerating Zytiga and Eligard without issue. Labs reviewed, overall stable to improving. He will continue injection every 3 months. -plan to repeat bone scan in 3 months   2. Bone metastasis and pain  -he received palliative RT to T10-SI joints under Dr. MaTammi Klippel/26-08/03/21 -Zometa started on 07/06/21, will continue every 3 months.  -will continue calcium and vit D. -s/p kyphoplasty on 10/18/21. Path showed necrosis, malignancy was not excluded. -he continues on gabapentin, pain mostly controlled. We discussed increasing his gabapentin dose to 2-3 capsules TID. We also discussed adding celebrex, to replace his aleve; I called in for him today.  3. HTN -he was previously on amlodipine, notes he is not taking  it. -his BP is up to 155/96 today (01/19/22). I advised him to discuss with his PCP, whom he notes he will see 01/2022.     PLAN: -proceed with Lupron injection and Zometa infusion today -continue Zytiga 1000 mg and prednisone 10 mg daily, I refilled both today -I also refilled gabapentin and called in celebrex -f/u, Zometa, and Eligard in 3 months with lab and bone scan several days before   No problem-specific Assessment & Plan notes found for this encounter.   SUMMARY OF ONCOLOGIC HISTORY: Oncology History Overview Note   Cancer Staging  Pro[facility] cancer [facility] to bone [facility]: Prostate, AJCC 8th Edition - Clinical stage from 07/01/2021: Stage IVB (cTX, cNX, pM1b) - Signed by FeTruitt MerleMD on 07/05/2021    [facility] bone [facility] 06/30/2021 Tumor Marker   Patient's tumor was tested for the following markers: PSA. Results of the tumor marker test revealed >3,000.   07/01/2021 Cancer Staging   Staging form: Prostate, AJCC 8th Edition - Clinical stage from 07/01/2021: Stage IVB (cTX, cNX, pM1b) - Signed by FeTruitt MerleMD on 07/05/2021   07/01/2021 Imaging   EXAM: CT CHEST, ABDOMEN, AND PELVIS WITH CONTRAST  IMPRESSION: Widespread osseous metastatic disease of unknown primary. Numerous pathologic fractures involving the ribs bilaterally as well as the T11, T12, L2, L3, and L4 vertebral bodies. Retropulsion of T11 and L3 results in severe central canal stenosis at L3. Multifactorial severe central canal stenosis also noted at L4, in part related to metastatic disease. Prominent soft tissue component involving the metastasis involving the spinous process of L5 may provide an appropriate target for  tissue sampling for further evaluation.   Mild coronary artery calcification.   Thoracic aortic aneurysm with maximal transaxial dimension of 4.9 cm. Ascending thoracic aortic aneurysm. Recommend semi-annual imaging followup by CTA or MRA and referral to  cardiothoracic surgery if not already obtained. This recommendation follows 2010 ACCF/AHA/AATS/ACR/ASA/SCA/SCAI/SIR/STS/SVM Guidelines for the Diagnosis and Management of Patients With Thoracic Aortic Disease. Circulation. 2010; 121: Y774-J287. Aortic aneurysm NOS (ICD10-I71.9)   Moderate enlargement of the prostate gland. Correlation with serum PSA may be helpful.   07/01/2021 Imaging   EXAM: MRI HEAD WITHOUT CONTRAST  IMPRESSION: 1. No evidence of intracranial metastases on this noncontrast study. 2. Diffusely abnormal bone marrow signal consistent with known widespread osseous metastases.   07/01/2021 Pathology Results   FINAL MICROSCOPIC DIAGNOSIS:   A. SOFT TISSUE MASS, L5, NEEDLE CORE BIOPSY:  -  Metastatic carcinoma  -  See comment   COMMENT:  By immunohistochemistry, the neoplastic cells are positive for PSA and prostein but negative for cytokeratin 7, cytokeratin 20, CDX2 and TTF-1. Overall, the immunoprofile is consistent with a prostatic primary.   07/02/2021 Initial Diagnosis   Prostate cancer metastatic to bone ([place])   07/21/2021 - 08/03/2021 Radiation Therapy   The painful site of metastasis at L5 was treated to 30 Gy in 10 fractions of 3 Gy each.   08/03/2021 Tumor Marker   Patient's tumor was tested for the following markers: PSA. Results of the tumor marker test revealed 76.8.      INTERVAL HISTORY:  Patrick [name] is here for a follow up of metastatic prostate cancer. He was last seen by me on 10/26/21. He presents to the clinic alone. He reports he is doing well. He explains he lives at home and is able to care for himself. He notes he uses a walker but hopes to "graduate" to a cane. He reports occasional hot flashes, last was a few days ago.   All other systems were reviewed with the patient and are negative.  MEDICAL HISTORY:  Past Medical History:  Diagnosis Date   Allergy    seasonal   Arthritis    r knee   H/O inguinal hernia repair    History of  tobacco use    HIV infection ([place])    Hyperlipidemia    Hypertension    Strabismic amblyopia     SURGICAL HISTORY: Past Surgical History:  Procedure Laterality Date   EYE SURGERY     strabismus   HERNIA REPAIR     IR BONE TUMOR(S)RF ABLATION  10/18/2021   IR BONE TUMOR(S)RF ABLATION  10/18/2021   IR KYPHO EA ADDL LEVEL THORACIC OR LUMBAR  10/18/2021   IR KYPHO LUMBAR INC FX REDUCE BONE BX UNI/BIL CANNULATION INC/IMAGING  10/18/2021    I have reviewed the social history and family history with the patient and they are unchanged from previous note.  ALLERGIES:  has No Known Allergies.  MEDICATIONS:  Current Outpatient Medications  Medication Sig Dispense Refill   celecoxib (CELEBREX) 200 MG capsule Take 1 capsule (200 mg total) by mouth 2 (two) times daily. 30 capsule 1   abiraterone acetate (ZYTIGA) 250 MG tablet Take 4 tablets (1,000 mg total) by mouth daily. Take on an empty stomach 1 hour before or 2 hours after a meal 120 tablet 2   albuterol (VENTOLIN HFA) 108 (90 Base) MCG/ACT inhaler Inhale 2 puffs into the lungs every 6 (six) hours as needed for shortness of breath or wheezing.     amLODipine (NORVASC)  2.5 MG tablet Take 2.5 mg by mouth daily. (Patient not taking: Reported on 08/25/2021)     atorvastatin (LIPITOR) 40 MG tablet TAKE 1 TABLET(40 MG) BY MOUTH DAILY 90 tablet 2   calcium citrate-vitamin D (CITRACAL+D) 315-200 MG-UNIT tablet Take 1 tablet by mouth daily.     gabapentin (NEURONTIN) 100 MG capsule Take 2-3 capsules (200-300 mg total) by mouth 3 (three) times daily. 180 capsule 1   lidocaine (LIDODERM) 5 % Place 1 patch onto the skin daily. Remove & Discard patch within 12 hours or as directed by MD (Patient not taking: Reported on 08/25/2021) 30 patch 0   Multiple Vitamins-Minerals (MULTIVITAMIN WITH MINERALS) tablet Take 1 tablet by mouth daily.     ODEFSEY 200-25-25 MG TABS tablet TAKE 1 TABLET BY MOUTH EVERY DAY 90 tablet 0   predniSONE (DELTASONE) 5 MG tablet Take 1  tablet (5 mg total) by mouth daily with breakfast. 30 tablet 2   No current facility-administered medications for this visit.    PHYSICAL EXAMINATION: ECOG PERFORMANCE STATUS: 2 - Symptomatic, <50% confined to bed  Vitals:   01/19/22 1427  BP: (!) 155/96  Pulse: 90  Resp: 18  Temp: 98.7 F (37.1 C)  SpO2: 96%   Wt Readings from Last 3 Encounters:  01/19/22 174 lb 4.8 oz (79.1 kg)  10/26/21 167 lb 12.8 oz (76.1 kg)  10/18/21 164 lb 7.4 oz (74.6 kg)     GENERAL:alert, no distress and comfortable SKIN: skin color, texture, turgor are normal, no rashes or significant lesions EYES: normal, Conjunctiva are pink and non-injected, sclera clear  LUNGS: clear to auscultation and percussion with normal breathing effort HEART: regular rate & rhythm and no murmurs and no lower extremity edema ABDOMEN:abdomen soft, non-tender and normal bowel sounds Musculoskeletal:no cyanosis of digits and no clubbing  NEURO: alert & oriented x 3 with fluent speech, no focal motor/sensory deficits  LABORATORY DATA:  I have reviewed the data as listed    Latest Ref Rng & Units 01/19/2022    1:45 PM 10/26/2021   11:34 AM 10/18/2021    8:36 AM  CBC  WBC 4.0 - 10.5 K/uL 5.7  4.2  4.2   Hemoglobin 13.0 - 17.0 g/dL 12.7  11.9  12.4   Hematocrit 39.0 - 52.0 % 35.7  36.4  38.6   Platelets 150 - 400 K/uL 185  206  234         Latest Ref Rng & Units 01/19/2022    1:45 PM 10/26/2021   11:36 AM 10/18/2021    8:36 AM  CMP  Glucose 70 - 99 mg/dL 103  90  82   BUN 8 - 23 mg/dL '16  22  17   '$ Creatinine 0.61 - 1.24 mg/dL 0.62  0.60  0.63   Sodium 135 - 145 mmol/L 138  140  142   Potassium 3.5 - 5.1 mmol/L 3.8  4.2  3.4   Chloride 98 - 111 mmol/L 104  104  105   CO2 22 - 32 mmol/L '26  29  28   '$ Calcium 8.9 - 10.3 mg/dL 9.3  9.5  9.7   Total Protein 6.5 - 8.1 g/dL 6.8  7.0    Total Bilirubin 0.3 - 1.2 mg/dL 0.7  0.6    Alkaline Phos 38 - 126 U/L 78  118    AST 15 - 41 U/L 19  21    ALT 0 - 44 U/L 13  14  RADIOGRAPHIC STUDIES: I have personally reviewed the radiological images as listed and agreed with the findings in the report. No results found.    Orders Placed This Encounter  Procedures   NM Bone Scan Whole Body    Standing Status:   Future    Standing Expiration Date:   04/18/2022    Order Specific Question:   If indicated for the ordered procedure, I authorize the administration of a radiopharmaceutical per Radiology protocol    Answer:   Yes    Order Specific Question:   Preferred imaging location?    Answer:   [facility] [place]   All questions were answered. The patient knows to call the clinic with any problems, questions or concerns. No barriers to learning was detected. The total time spent in the appointment was 30 minutes.     Truitt Merle, MD 01/19/2022   I, Wilburn Mylar, am acting as scribe for Truitt Merle, MD.   I have reviewed the above documentation for accuracy and completeness, and I agree with the above.

## 2022-01-20 ENCOUNTER — Other Ambulatory Visit (HOSPITAL_COMMUNITY): Payer: Self-pay

## 2022-01-20 ENCOUNTER — Other Ambulatory Visit: Payer: Self-pay | Admitting: Hematology

## 2022-01-20 DIAGNOSIS — C61 Malignant neoplasm of prostate: Secondary | ICD-10-CM

## 2022-01-20 LAB — PSA, TOTAL AND FREE
PSA, Free Pct: 18.7 %
PSA, Free: 3.61 ng/mL
Prostate Specific Ag, Serum: 19.3 ng/mL — ABNORMAL HIGH (ref 0.0–4.0)

## 2022-01-23 ENCOUNTER — Other Ambulatory Visit (HOSPITAL_COMMUNITY): Payer: Self-pay

## 2022-01-24 ENCOUNTER — Telehealth: Payer: Self-pay | Admitting: Hematology

## 2022-01-24 NOTE — Telephone Encounter (Signed)
Scheduled follow-up appointments per 7/27 los. Patient is aware. 

## 2022-01-25 LAB — TESTOSTERONE, FREE, TOTAL, SHBG
Sex Hormone Binding: 37.6 nmol/L (ref 19.3–76.4)
Testosterone, Free: <0.2 pg/mL — ABNORMAL LOW (ref 6.6–18.1)
Testosterone: <3 ng/dL — ABNORMAL LOW (ref 264–916)

## 2022-02-01 DIAGNOSIS — M6258 Muscle wasting and atrophy, not elsewhere classified, other site: Secondary | ICD-10-CM | POA: Diagnosis not present

## 2022-02-01 DIAGNOSIS — M6281 Muscle weakness (generalized): Secondary | ICD-10-CM | POA: Diagnosis not present

## 2022-02-02 ENCOUNTER — Other Ambulatory Visit (HOSPITAL_COMMUNITY): Payer: Self-pay

## 2022-02-05 ENCOUNTER — Other Ambulatory Visit: Payer: Self-pay | Admitting: Hematology

## 2022-02-05 DIAGNOSIS — C7951 Secondary malignant neoplasm of bone: Secondary | ICD-10-CM

## 2022-02-06 ENCOUNTER — Encounter: Payer: Self-pay | Admitting: Gastroenterology

## 2022-02-09 ENCOUNTER — Other Ambulatory Visit (HOSPITAL_COMMUNITY): Payer: Self-pay

## 2022-02-09 ENCOUNTER — Other Ambulatory Visit: Payer: Self-pay

## 2022-02-13 ENCOUNTER — Inpatient Hospital Stay: Payer: PPO | Attending: Hematology

## 2022-02-13 DIAGNOSIS — C61 Malignant neoplasm of prostate: Secondary | ICD-10-CM | POA: Diagnosis not present

## 2022-02-13 LAB — CBC WITH DIFFERENTIAL/PLATELET
Abs Immature Granulocytes: 0.01 10*3/uL (ref 0.00–0.07)
Basophils Absolute: 0 10*3/uL (ref 0.0–0.1)
Basophils Relative: 1 %
Eosinophils Absolute: 0.2 10*3/uL (ref 0.0–0.5)
Eosinophils Relative: 8 %
HCT: 33.6 % — ABNORMAL LOW (ref 39.0–52.0)
Hemoglobin: 11.9 g/dL — ABNORMAL LOW (ref 13.0–17.0)
Immature Granulocytes: 0 %
Lymphocytes Relative: 27 %
Lymphs Abs: 0.8 10*3/uL (ref 0.7–4.0)
MCH: 32 pg (ref 26.0–34.0)
MCHC: 35.4 g/dL (ref 30.0–36.0)
MCV: 90.3 fL (ref 80.0–100.0)
Monocytes Absolute: 0.3 10*3/uL (ref 0.1–1.0)
Monocytes Relative: 12 %
Neutro Abs: 1.5 10*3/uL — ABNORMAL LOW (ref 1.7–7.7)
Neutrophils Relative %: 52 %
Platelets: 150 10*3/uL (ref 150–400)
RBC: 3.72 MIL/uL — ABNORMAL LOW (ref 4.22–5.81)
RDW: 13.2 % (ref 11.5–15.5)
WBC: 2.8 10*3/uL — ABNORMAL LOW (ref 4.0–10.5)
nRBC: 0 % (ref 0.0–0.2)

## 2022-02-13 LAB — COMPREHENSIVE METABOLIC PANEL
ALT: 13 U/L (ref 0–44)
AST: 19 U/L (ref 15–41)
Albumin: 4.3 g/dL (ref 3.5–5.0)
Alkaline Phosphatase: 73 U/L (ref 38–126)
Anion gap: 5 (ref 5–15)
BUN: 14 mg/dL (ref 8–23)
CO2: 28 mmol/L (ref 22–32)
Calcium: 9.1 mg/dL (ref 8.9–10.3)
Chloride: 106 mmol/L (ref 98–111)
Creatinine, Ser: 0.59 mg/dL — ABNORMAL LOW (ref 0.61–1.24)
GFR, Estimated: >60 mL/min (ref >60)
Glucose, Bld: 100 mg/dL — ABNORMAL HIGH (ref 70–99)
Potassium: 3.5 mmol/L (ref 3.5–5.1)
Sodium: 139 mmol/L (ref 135–145)
Total Bilirubin: 0.6 mg/dL (ref 0.3–1.2)
Total Protein: 6.2 g/dL — ABNORMAL LOW (ref 6.5–8.1)

## 2022-02-14 ENCOUNTER — Other Ambulatory Visit (HOSPITAL_COMMUNITY): Payer: Self-pay

## 2022-02-14 ENCOUNTER — Other Ambulatory Visit: Payer: Self-pay

## 2022-02-14 LAB — PROSTATE-SPECIFIC AG, SERUM (LABCORP): Prostate Specific Ag, Serum: 99.6 ng/mL — ABNORMAL HIGH (ref 0.0–4.0)

## 2022-02-14 NOTE — Progress Notes (Signed)
LVM with NM to get pt's NM Whole Body Scan scheduled.  Procedure has been authorized.  Awaiting return call from [place] regarding scheduling availability.

## 2022-02-15 ENCOUNTER — Other Ambulatory Visit: Payer: Self-pay

## 2022-02-16 ENCOUNTER — Other Ambulatory Visit (HOSPITAL_COMMUNITY): Payer: Self-pay

## 2022-02-20 ENCOUNTER — Other Ambulatory Visit (HOSPITAL_COMMUNITY): Payer: Self-pay

## 2022-02-21 LAB — TESTOSTERONE, FREE, TOTAL, SHBG
Sex Hormone Binding: 33.1 nmol/L (ref 19.3–76.4)
Testosterone, Free: <0.2 pg/mL — ABNORMAL LOW (ref 6.6–18.1)
Testosterone: <3 ng/dL — ABNORMAL LOW (ref 264–916)

## 2022-02-22 ENCOUNTER — Encounter (HOSPITAL_COMMUNITY)
Admission: RE | Admit: 2022-02-22 | Discharge: 2022-02-22 | Disposition: A | Discharge status: Home / Self Care | Payer: PPO | Source: Ambulatory Visit | Attending: Hematology | Admitting: Hematology

## 2022-02-22 DIAGNOSIS — C7951 Secondary malignant neoplasm of bone: Secondary | ICD-10-CM | POA: Diagnosis not present

## 2022-02-22 DIAGNOSIS — C61 Malignant neoplasm of prostate: Secondary | ICD-10-CM | POA: Diagnosis not present

## 2022-02-22 MED ORDER — TECHNETIUM TC 99M MEDRONATE IV KIT
19.6000 | PACK | Freq: Once | INTRAVENOUS | Status: AC
  Administered 2022-02-22: 19.6 via INTRAVENOUS

## 2022-02-23 ENCOUNTER — Other Ambulatory Visit (HOSPITAL_COMMUNITY): Payer: Self-pay

## 2022-02-28 ENCOUNTER — Other Ambulatory Visit: Payer: Self-pay | Admitting: Hematology

## 2022-02-28 ENCOUNTER — Other Ambulatory Visit (HOSPITAL_COMMUNITY): Payer: Self-pay

## 2022-02-28 DIAGNOSIS — C7951 Secondary malignant neoplasm of bone: Secondary | ICD-10-CM

## 2022-03-01 ENCOUNTER — Other Ambulatory Visit: Payer: PPO

## 2022-03-01 ENCOUNTER — Encounter: Payer: Self-pay | Admitting: Hematology

## 2022-03-01 ENCOUNTER — Ambulatory Visit (INDEPENDENT_AMBULATORY_CARE_PROVIDER_SITE_OTHER): Payer: PPO

## 2022-03-01 ENCOUNTER — Other Ambulatory Visit (HOSPITAL_COMMUNITY): Payer: Self-pay

## 2022-03-01 ENCOUNTER — Other Ambulatory Visit (INDEPENDENT_AMBULATORY_CARE_PROVIDER_SITE_OTHER): Payer: PPO

## 2022-03-01 ENCOUNTER — Other Ambulatory Visit (HOSPITAL_COMMUNITY)
Admission: RE | Admit: 2022-03-01 | Discharge: 2022-03-01 | Disposition: A | Discharge status: Home / Self Care | Payer: PPO | Source: Ambulatory Visit | Attending: Infectious Diseases | Admitting: Infectious Diseases

## 2022-03-01 DIAGNOSIS — B20 Human immunodeficiency virus [HIV] disease: Secondary | ICD-10-CM

## 2022-03-01 DIAGNOSIS — I10 Essential (primary) hypertension: Secondary | ICD-10-CM

## 2022-03-01 DIAGNOSIS — Z79899 Other long term (current) drug therapy: Secondary | ICD-10-CM | POA: Diagnosis not present

## 2022-03-01 DIAGNOSIS — C61 Malignant neoplasm of prostate: Secondary | ICD-10-CM | POA: Diagnosis not present

## 2022-03-01 DIAGNOSIS — C7951 Secondary malignant neoplasm of bone: Secondary | ICD-10-CM

## 2022-03-01 DIAGNOSIS — Z113 Encounter for screening for infections with a predominantly sexual mode of transmission: Secondary | ICD-10-CM | POA: Diagnosis not present

## 2022-03-01 DIAGNOSIS — Z87891 Personal history of nicotine dependence: Secondary | ICD-10-CM | POA: Diagnosis not present

## 2022-03-01 LAB — T-HELPER CELLS (CD4) COUNT (NOT AT ARMC)
CD4 % Helper T Cell: 33 % (ref 33–65)
CD4 T Cell Abs: 251 /uL — ABNORMAL LOW (ref 400–1790)

## 2022-03-01 MED ORDER — AMLODIPINE BESYLATE 5 MG PO TABS
5.0000 mg | ORAL_TABLET | Freq: Every day | ORAL | 11 refills | Status: DC
  Filled 2022-03-01: qty 30, 30d supply, fill #0
  Filled 2022-04-04: qty 30, 30d supply, fill #1
  Filled 2022-04-27: qty 30, 30d supply, fill #2
  Filled 2022-05-26: qty 30, 30d supply, fill #3
  Filled 2022-06-26: qty 30, 30d supply, fill #4
  Filled 2022-07-27: qty 30, 30d supply, fill #5
  Filled 2022-09-06: qty 30, 30d supply, fill #6

## 2022-03-01 MED ORDER — GABAPENTIN 100 MG PO CAPS
200.0000 mg | ORAL_CAPSULE | Freq: Three times a day (TID) | ORAL | 1 refills | Status: DC
  Filled 2022-03-01: qty 180, 20d supply, fill #0
  Filled 2022-04-04: qty 180, 20d supply, fill #1

## 2022-03-01 NOTE — Progress Notes (Signed)
   CC: Blood pressure check  HPI:  [name] is a 71 y.o. with past medical history as below who presents for blood pressure check.  Past Medical History:  Diagnosis Date   Allergy    seasonal   Arthritis    r knee   H/O inguinal hernia repair    History of tobacco use    HIV infection ([place])    Hyperlipidemia    Hypertension    Strabismic amblyopia    Review of Systems: Pertinent ROS included in detailed assessment and plan.  Physical Exam:  Vitals:   03/01/22 0943  BP: (!) 164/81  Pulse: 72  Temp: 98.5 F (36.9 C)  TempSrc: Oral  SpO2: 98%  Weight: 172 lb 1.6 oz (78.1 kg)  Height: '5\' 10"'$  (1.778 m)   Physical Exam Constitutional:      General: He is not in acute distress. HENT:     Head: Normocephalic and atraumatic.  Eyes:     Extraocular Movements: Extraocular movements intact.  Cardiovascular:     Rate and Rhythm: Normal rate and regular rhythm.  Pulmonary:     Effort: Pulmonary effort is normal.     Breath sounds: Normal breath sounds.  Skin:    General: Skin is warm and dry.  Neurological:     Mental Status: He is alert and oriented to person, place, and time.  Psychiatric:        Mood and Affect: Mood normal.      Assessment & Plan:   See Encounters Tab for problem based charting.  Essential hypertension Blood pressure elevated again today at 164/81. He did not take any anti-hypertensives today. States Tribenzor is no longer covered by his insurance but he has some pills left over that he takes if he gets a headache. Denies vision changes, chest pains. Denies symptoms of hypotension. Discussed taking pressure every morning and recording readings. Discussed importance of taking his medication every day. If home readings consistently with systolic of 518A or above, discussed calling clinic to return for medication management.  -Start amlodipine '5mg'$  daily -F/u in 6 months  Human immunodeficiency virus (HIV) disease ([place]) Sees Dr.  Johnnye Sima in infectious disease. On Odefsey. Scheduled for follow up this month.   -Will get labs today ordered for upcoming visit  Prostate cancer metastatic to bone [facility]) Sees Dr. Burr Medico in hem onc. Current regimen: Lupron, started 07/06/21, now q42month, Zometa, q34month starting 07/06/21, Zytiga 1000 mg daily and prednisone 10 mg, starting in 07/2021. Also s/p kyphoplasty on 10/18/21. Path showed necrosis, malignancy was not excluded.  -F/u with Dr. FeBurr Medicon 10/27 -Continue gabapentin -Continue celebrex   Patient seen with Dr. ViEvette Doffing

## 2022-03-01 NOTE — Assessment & Plan Note (Signed)
Sees Dr. Burr Medico in hem onc. Current regimen: Lupron, started 07/06/21, now q42month, Zometa, q358month starting 07/06/21, Zytiga 1000 mg daily and prednisone'10mg'$ , starting in2/2023. Also s/p kyphoplasty on 10/18/21. Path showed necrosis, malignancy was not excluded.  -F/u with Dr. FeBurr Medicon 10/27 -Continue gabapentin -Continue celebrex

## 2022-03-01 NOTE — Addendum Note (Signed)
Addended by: Judyann Munson on: 03/01/2022 10:32 AM   Modules accepted: Orders

## 2022-03-01 NOTE — Assessment & Plan Note (Addendum)
Blood pressure elevated again today at 164/81. He did not take any anti-hypertensives today. States Tribenzor is no longer covered by his insurance but he has some pills left over that he takes if he gets a headache. Denies vision changes, chest pains. Denies symptoms of hypotension. Discussed taking pressure every morning and recording readings. Discussed importance of taking his medication every day. If home readings consistently with systolic of 520E or above, discussed calling clinic to return for medication management.  -Start amlodipine '5mg'$  daily -F/u in 6 months

## 2022-03-01 NOTE — Patient Instructions (Signed)
Mr.Renan F Bynum III, it was a pleasure seeing you today!  Today we discussed: Blood pressure: Your blood pressure was elevated today. We discussed starting amlodipine 5 mg daily while taking your pressures each morning and recording the readings. We will see you back in about 6 months to assess your pressures and medication. Please reach out to the clinic if your blood pressure is not within goal of 086P or below (systolic).  I have ordered the following labs today:  Lab Orders  No laboratory test(s) ordered today     Tests ordered today:  none  Referrals ordered today:   Referral Orders  No referral(s) requested today     I have ordered the following medication/changed the following medications:   Stop the following medications: Medications Discontinued During This Encounter  Medication Reason   amLODipine (NORVASC) 2.5 MG tablet Dose change     Start the following medications: Meds ordered this encounter  Medications   amLODipine (NORVASC) 5 MG tablet    Sig: Take 1 tablet (5 mg total) by mouth daily.    Dispense:  30 tablet    Refill:  11     Follow-up: 6 months   Please make sure to arrive 15 minutes prior to your next appointment. If you arrive late, you may be asked to reschedule.   We look forward to seeing you next time. Please call our clinic at [phone] if you have any questions or concerns. The best time to call is Monday-Friday from 9am-4pm, but there is someone available 24/7. If after hours or the weekend, call the main hospital number and ask for the Internal Medicine Resident On-Call. If you need medication refills, please notify your pharmacy one week in advance and they will send [place] a request.  Thank you for letting us take part in your care. Wishing you the best!  Thank you, [name], MD

## 2022-03-01 NOTE — Assessment & Plan Note (Signed)
Sees Dr. Johnnye Sima in infectious disease. On Odefsey. Scheduled for follow up this month.   -Will get labs today ordered for upcoming visit

## 2022-03-02 ENCOUNTER — Other Ambulatory Visit (HOSPITAL_COMMUNITY): Payer: Self-pay

## 2022-03-02 LAB — COMPREHENSIVE METABOLIC PANEL
ALT: 13 IU/L (ref 0–44)
AST: 36 IU/L (ref 0–40)
Albumin/Globulin Ratio: 2.1 (ref 1.2–2.2)
Albumin: 4.4 g/dL (ref 3.8–4.8)
Alkaline Phosphatase: 136 IU/L — ABNORMAL HIGH (ref 44–121)
BUN/Creatinine Ratio: 19 (ref 10–24)
BUN: 12 mg/dL (ref 8–27)
Bilirubin Total: 0.4 mg/dL (ref 0.0–1.2)
CO2: 25 mmol/L (ref 20–29)
Calcium: 9.5 mg/dL (ref 8.6–10.2)
Chloride: 102 mmol/L (ref 96–106)
Creatinine, Ser: 0.63 mg/dL — ABNORMAL LOW (ref 0.76–1.27)
Globulin, Total: 2.1 g/dL (ref 1.5–4.5)
Glucose: 99 mg/dL (ref 70–99)
Potassium: 3.9 mmol/L (ref 3.5–5.2)
Sodium: 141 mmol/L (ref 134–144)
Total Protein: 6.5 g/dL (ref 6.0–8.5)
eGFR: 102 mL/min/{1.73_m2} (ref >59)

## 2022-03-02 LAB — URINE CYTOLOGY ANCILLARY ONLY
Chlamydia: NEGATIVE
Comment: NEGATIVE
Comment: NORMAL
Neisseria Gonorrhea: NEGATIVE

## 2022-03-02 LAB — CBC
Hematocrit: 34.5 % — ABNORMAL LOW (ref 37.5–51.0)
Hemoglobin: 11.9 g/dL — ABNORMAL LOW (ref 13.0–17.7)
MCH: 32.4 pg (ref 26.6–33.0)
MCHC: 34.5 g/dL (ref 31.5–35.7)
MCV: 94 fL (ref 79–97)
Platelets: 165 10*3/uL (ref 150–450)
RBC: 3.67 x10E6/uL — ABNORMAL LOW (ref 4.14–5.80)
RDW: 13.1 % (ref 11.6–15.4)
WBC: 4.7 10*3/uL (ref 3.4–10.8)

## 2022-03-02 LAB — LIPID PANEL
Chol/HDL Ratio: 3 ratio (ref 0.0–5.0)
Cholesterol, Total: 133 mg/dL (ref 100–199)
HDL: 44 mg/dL (ref >39)
LDL Chol Calc (NIH): 65 mg/dL (ref 0–99)
Triglycerides: 135 mg/dL (ref 0–149)
VLDL Cholesterol Cal: 24 mg/dL (ref 5–40)

## 2022-03-02 LAB — RPR: RPR Ser Ql: NONREACTIVE

## 2022-03-02 LAB — HIV-1 RNA QUANT-NO REFLEX-BLD: HIV-1 RNA Viral Load: <20 copies/mL

## 2022-03-02 NOTE — Progress Notes (Signed)
Internal Medicine Clinic Attending   I saw and evaluated the patient.  I personally confirmed the key portions of the history and exam documented by Dr. White and I reviewed pertinent patient test results.  The assessment, diagnosis, and plan were formulated together and I agree with the documentation in the resident's note.  

## 2022-03-04 DIAGNOSIS — M6258 Muscle wasting and atrophy, not elsewhere classified, other site: Secondary | ICD-10-CM | POA: Diagnosis not present

## 2022-03-04 DIAGNOSIS — M6281 Muscle weakness (generalized): Secondary | ICD-10-CM | POA: Diagnosis not present

## 2022-03-08 ENCOUNTER — Other Ambulatory Visit: Payer: Self-pay | Admitting: Hematology

## 2022-03-08 DIAGNOSIS — C7951 Secondary malignant neoplasm of bone: Secondary | ICD-10-CM

## 2022-03-15 ENCOUNTER — Ambulatory Visit (INDEPENDENT_AMBULATORY_CARE_PROVIDER_SITE_OTHER): Payer: PPO | Admitting: Infectious Diseases

## 2022-03-15 ENCOUNTER — Encounter: Payer: Self-pay | Admitting: Infectious Diseases

## 2022-03-15 ENCOUNTER — Other Ambulatory Visit: Payer: Self-pay

## 2022-03-15 VITALS — BP 124/63 | HR 97 | Temp 97.7°F | Ht 70.0 in | Wt 171.1 lb

## 2022-03-15 DIAGNOSIS — C7951 Secondary malignant neoplasm of bone: Secondary | ICD-10-CM | POA: Diagnosis not present

## 2022-03-15 DIAGNOSIS — M8458XA Pathological fracture in neoplastic disease, other specified site, initial encounter for fracture: Secondary | ICD-10-CM | POA: Diagnosis not present

## 2022-03-15 DIAGNOSIS — I10 Essential (primary) hypertension: Secondary | ICD-10-CM | POA: Diagnosis not present

## 2022-03-15 DIAGNOSIS — G8929 Other chronic pain: Secondary | ICD-10-CM | POA: Diagnosis not present

## 2022-03-15 DIAGNOSIS — E7800 Pure hypercholesterolemia, unspecified: Secondary | ICD-10-CM

## 2022-03-15 DIAGNOSIS — Z113 Encounter for screening for infections with a predominantly sexual mode of transmission: Secondary | ICD-10-CM

## 2022-03-15 DIAGNOSIS — Z23 Encounter for immunization: Secondary | ICD-10-CM

## 2022-03-15 DIAGNOSIS — Z87891 Personal history of nicotine dependence: Secondary | ICD-10-CM

## 2022-03-15 DIAGNOSIS — B20 Human immunodeficiency virus [HIV] disease: Secondary | ICD-10-CM | POA: Diagnosis not present

## 2022-03-15 DIAGNOSIS — C61 Malignant neoplasm of prostate: Secondary | ICD-10-CM

## 2022-03-15 DIAGNOSIS — Z79899 Other long term (current) drug therapy: Secondary | ICD-10-CM

## 2022-03-15 DIAGNOSIS — M545 Low back pain, unspecified: Secondary | ICD-10-CM | POA: Diagnosis not present

## 2022-03-15 MED ORDER — ODEFSEY 200-25-25 MG PO TABS: 1.0000 | ORAL_TABLET | Freq: Every day | ORAL | 3 refills | Status: DC

## 2022-03-15 NOTE — Assessment & Plan Note (Addendum)
Believes this has improved with zometa.  Appreciate Dr Ernestina Penna f/u.

## 2022-03-15 NOTE — Assessment & Plan Note (Signed)
normal last blood draw, LFTs normal.

## 2022-03-15 NOTE — Assessment & Plan Note (Signed)
Will get flu vax today Does not want COVID vax (afraid of mRNA vax).  He is doing well, some drop in his CD4 but his VL is undetectable.  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  Will see him back in 6 months with labs prior.

## 2022-03-15 NOTE — Progress Notes (Signed)
   Subjective:    Patient ID: Patrick [name], male  DOB: 12/18/1950, 71 y.o.        MRN: [id number]   HPI 71 yo M HIV+ on ART since 2002, hyperlipidemia.  Currently on odefsy and baby ASA, Ca citrate, Vit D. Amlodipine.  On lipitor '40mg'$ . Dx (06-2021) with prostate cancer with bone mets. He is getting xrt. He had kyphoplasty T11 and L4 (09-2021). He is on lupron, zomeda, zytiga, prednisone.  Has PET scheduled 03-23-22 at [facility].   He comes to clinic today walking with a cane. Can walk short distances without his care. Is now taking neurontin '300mg'$  tid but made him "muddleheaded".  Sometimes feels a catch in his back.  Has bruises on L wrist from "banging into stove".   Has some family stress with mother recently going into SNF.    HIV 1 RNA Quant  Date Value  06/30/2021 <20 copies/mL  09/03/2020 Not Detected Copies/mL  08/19/2019 <20 NOT DETECTED copies/mL   HIV-1 RNA Viral Load (copies/mL)  Date Value  03/01/2022 <20   CD4 T Cell Abs (/uL)  Date Value  03/01/2022 251 (L)  08/19/2019 503  08/28/2018 450     Health Maintenance  Topic Date Due   COVID-19 Vaccine (1) Never done   TETANUS/TDAP  04/30/2017   COLONOSCOPY (Pts 45-5yr Insurance coverage will need to be confirmed)  01/01/2022   INFLUENZA VACCINE  01/24/2022   Pneumonia Vaccine 72 Years old  Completed   Hepatitis C Screening  Completed   Zoster Vaccines- Shingrix  Completed   HPV VACCINES  Aged Out      Review of Systems  Constitutional:  Negative for weight loss.  Respiratory:  Negative for cough and shortness of breath.   Gastrointestinal:  Negative for constipation and diarrhea.  Genitourinary:  Negative for dysuria.  Musculoskeletal:  Positive for back pain.   Please see HPI. All other systems reviewed and negative.     Objective:  Physical Exam Vitals reviewed.  Constitutional:      Appearance: Normal appearance. He is normal weight.  HENT:     Mouth/Throat:     Mouth: Mucous membranes  are moist.     Pharynx: Oropharynx is clear.  Eyes:     Extraocular Movements: Extraocular movements intact.     Pupils: Pupils are equal, round, and reactive to light.  Cardiovascular:     Rate and Rhythm: Normal rate and regular rhythm.  Pulmonary:     Effort: Pulmonary effort is normal.     Breath sounds: Normal breath sounds.  Abdominal:     General: Bowel sounds are normal. There is no distension.     Palpations: Abdomen is soft.     Tenderness: There is no abdominal tenderness.  Musculoskeletal:     Right lower leg: No edema.     Left lower leg: No edema.  Neurological:     General: No focal deficit present.     Mental Status: He is alert and oriented to person, place, and time.     Motor: No weakness.          Assessment & Plan:

## 2022-03-15 NOTE — Assessment & Plan Note (Addendum)
Appreciate Dr Ernestina Penna f/u PET pending.  ALk phos up PSA up.

## 2022-03-15 NOTE — Assessment & Plan Note (Signed)
Believes this has improved with zometa.  Appreciate Dr Fritz Pickerel f/u.

## 2022-03-15 NOTE — Assessment & Plan Note (Signed)
Doing well on CCB asx and normal BP today (down from prev).

## 2022-03-23 ENCOUNTER — Encounter (HOSPITAL_COMMUNITY)
Admission: RE | Admit: 2022-03-23 | Discharge: 2022-03-23 | Disposition: A | Discharge status: Home / Self Care | Payer: PPO | Source: Ambulatory Visit | Attending: Hematology | Admitting: Hematology

## 2022-03-23 ENCOUNTER — Ambulatory Visit (HOSPITAL_COMMUNITY): Admission: RE | Admit: 2022-03-23 | Payer: PPO | Source: Ambulatory Visit

## 2022-03-23 ENCOUNTER — Other Ambulatory Visit (HOSPITAL_COMMUNITY): Payer: Self-pay

## 2022-03-23 ENCOUNTER — Other Ambulatory Visit (HOSPITAL_COMMUNITY): Payer: PPO

## 2022-03-23 DIAGNOSIS — C7951 Secondary malignant neoplasm of bone: Secondary | ICD-10-CM | POA: Diagnosis not present

## 2022-03-23 DIAGNOSIS — C61 Malignant neoplasm of prostate: Secondary | ICD-10-CM | POA: Diagnosis not present

## 2022-03-23 MED ORDER — PIFLIFOLASTAT F 18 (PYLARIFY) INJECTION
9.0000 | Freq: Once | INTRAVENOUS | Status: AC
  Administered 2022-03-23: 9.59 via INTRAVENOUS

## 2022-03-27 ENCOUNTER — Inpatient Hospital Stay: Payer: PPO

## 2022-03-27 ENCOUNTER — Other Ambulatory Visit: Payer: Self-pay | Admitting: Genetic Counselor

## 2022-03-27 ENCOUNTER — Other Ambulatory Visit: Payer: Self-pay

## 2022-03-27 ENCOUNTER — Inpatient Hospital Stay: Payer: PPO | Attending: Hematology | Admitting: Hematology

## 2022-03-27 ENCOUNTER — Telehealth: Payer: Self-pay | Admitting: Genetic Counselor

## 2022-03-27 ENCOUNTER — Telehealth: Payer: Self-pay

## 2022-03-27 VITALS — BP 119/79 | HR 90 | Temp 98.6°F | Resp 18 | Ht 70.0 in | Wt 167.7 lb

## 2022-03-27 DIAGNOSIS — Z7952 Long term (current) use of systemic steroids: Secondary | ICD-10-CM | POA: Diagnosis not present

## 2022-03-27 DIAGNOSIS — C61 Malignant neoplasm of prostate: Secondary | ICD-10-CM | POA: Insufficient documentation

## 2022-03-27 DIAGNOSIS — Z87891 Personal history of nicotine dependence: Secondary | ICD-10-CM | POA: Insufficient documentation

## 2022-03-27 DIAGNOSIS — Z79624 Long term (current) use of inhibitors of nucleotide synthesis: Secondary | ICD-10-CM | POA: Insufficient documentation

## 2022-03-27 DIAGNOSIS — I10 Essential (primary) hypertension: Secondary | ICD-10-CM | POA: Diagnosis not present

## 2022-03-27 DIAGNOSIS — C7951 Secondary malignant neoplasm of bone: Secondary | ICD-10-CM | POA: Diagnosis not present

## 2022-03-27 DIAGNOSIS — Z5111 Encounter for antineoplastic chemotherapy: Secondary | ICD-10-CM | POA: Insufficient documentation

## 2022-03-27 DIAGNOSIS — D649 Anemia, unspecified: Secondary | ICD-10-CM

## 2022-03-27 DIAGNOSIS — Z79899 Other long term (current) drug therapy: Secondary | ICD-10-CM | POA: Diagnosis not present

## 2022-03-27 DIAGNOSIS — Z5189 Encounter for other specified aftercare: Secondary | ICD-10-CM | POA: Insufficient documentation

## 2022-03-27 DIAGNOSIS — D696 Thrombocytopenia, unspecified: Secondary | ICD-10-CM | POA: Diagnosis not present

## 2022-03-27 LAB — CBC WITH DIFFERENTIAL/PLATELET
Abs Immature Granulocytes: 0.58 10*3/uL — ABNORMAL HIGH (ref 0.00–0.07)
Basophils Absolute: 0 10*3/uL (ref 0.0–0.1)
Basophils Relative: 1 %
Eosinophils Absolute: 0.1 10*3/uL (ref 0.0–0.5)
Eosinophils Relative: 2 %
HCT: 19.4 % — ABNORMAL LOW (ref 39.0–52.0)
Hemoglobin: 6.5 g/dL — CL (ref 13.0–17.0)
Immature Granulocytes: 14 %
Lymphocytes Relative: 22 %
Lymphs Abs: 0.9 10*3/uL (ref 0.7–4.0)
MCH: 31.4 pg (ref 26.0–34.0)
MCHC: 33.5 g/dL (ref 30.0–36.0)
MCV: 93.7 fL (ref 80.0–100.0)
Monocytes Absolute: 0.6 10*3/uL (ref 0.1–1.0)
Monocytes Relative: 15 %
Neutro Abs: 1.9 10*3/uL (ref 1.7–7.7)
Neutrophils Relative %: 46 %
Platelets: 51 10*3/uL — ABNORMAL LOW (ref 150–400)
RBC: 2.07 MIL/uL — ABNORMAL LOW (ref 4.22–5.81)
RDW: 15.4 % (ref 11.5–15.5)
Smear Review: NORMAL
WBC: 4.1 10*3/uL (ref 4.0–10.5)
nRBC: 9.1 % — ABNORMAL HIGH (ref 0.0–0.2)

## 2022-03-27 LAB — COMPREHENSIVE METABOLIC PANEL
ALT: 13 U/L (ref 0–44)
AST: 61 U/L — ABNORMAL HIGH (ref 15–41)
Albumin: 4.2 g/dL (ref 3.5–5.0)
Alkaline Phosphatase: 119 U/L (ref 38–126)
Anion gap: 9 (ref 5–15)
BUN: 17 mg/dL (ref 8–23)
CO2: 27 mmol/L (ref 22–32)
Calcium: 9.8 mg/dL (ref 8.9–10.3)
Chloride: 101 mmol/L (ref 98–111)
Creatinine, Ser: 0.9 mg/dL (ref 0.61–1.24)
GFR, Estimated: >60 mL/min (ref >60)
Glucose, Bld: 111 mg/dL — ABNORMAL HIGH (ref 70–99)
Potassium: 4.6 mmol/L (ref 3.5–5.1)
Sodium: 137 mmol/L (ref 135–145)
Total Bilirubin: 0.7 mg/dL (ref 0.3–1.2)
Total Protein: 6.1 g/dL — ABNORMAL LOW (ref 6.5–8.1)

## 2022-03-27 LAB — PREPARE RBC (CROSSMATCH)

## 2022-03-27 NOTE — Telephone Encounter (Signed)
CRITICAL VALUE STICKER  CRITICAL VALUE:   Hgb  6.5  RECEIVER (on-site recipient of call):   Rondel Baton, LPN  [place] NOTIFIED:   13:59  MESSENGER (representative from lab):  Lauren  MD NOTIFIED:   Burr Medico  TIME OF NOTIFICATION:   14:01

## 2022-03-27 NOTE — Progress Notes (Addendum)
[place]   [phone] [phone]   Clinic Follow up Note   Patient Care Team: [name], MD as PCP - General [name] [name], MD as PCP - Infectious Diseases (Infectious Diseases) Katheren Puller, RN as Oncology Nurse Navigator  Date of Service:  03/27/2022  CHIEF COMPLAINT: f/u of metastatic prostate cancer  CURRENT THERAPY:  -Lupron, started 07/06/21, now q36month -Zometa, q352month starting 07/06/21 -Zytiga 1000 mg daily and prednisone 10 mg, starting in 07/2021  ASSESSMENT & PLAN:  Patrick [name] is a 89.o. male with   1. 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 PSA showed extreme elevation of >3,000. CT CAP showed widespread osseous metastatic disease with multiple fractures. Biopsy of soft tissue metastasis at L5 confirmed metastatic prostate cancer.  -he started casodex on 1/10, and Lupron and Zometa on 07/06/21 in the hospital, and Zytiga '1000mg'$  with prednisone '10mg'$  in mid 07/2021, tolerating very well. -his PSA initially responded well, coming down to 0.5 on 10/26/21. Unfortunately, his PSA rose to 19.3 on 01/19/22 and 99.6 on 02/13/22. -restaging bone scan 02/22/22 showed probable osseous metastases. Further evaluation with PSMA PET scan on 03/23/22 confirmed widespread intensely radiotracer-avid skeletal metastasis involving axillary and appendicular skeleton; no abnormal activity in prostate gland or lymph nodes. -I reviewed the results with him today. I recommend changing his Zytiga to chemotherapy with docetaxel, every 3 weeks, for 10 cycles  --Chemotherapy consent: Side effects including but does not not limited to, fatigue, nausea, vomiting, diarrhea, hair loss, neuropathy, fluid retention, renal and kidney dysfunction, neutropenic fever, needed for blood transfusion, bleeding, were discussed with patient in great detail. He  agrees to proceed, plan to start on Friday, 10/6. -the goal of chemo is palliative    2. Worsening anemia and thrombocytopenia  -present at initial presentation in 06/2021, which was new from 08/2020. -his counts are very low today-- RBC 2.07, hgb 6.5, plt 51k. We will give blood transfusion this week. -the worsening anemia is likely related to his diffuse bone mets  -he has developed new thrombocytopenia, likely secondary to bone mets, will monitor closely on chemo   3. Bone metastasis and pain  -he received palliative RT to T10-SI joints under Dr. MaTammi Klippel/26/23 - 08/03/21 -Zometa started on 07/06/21, will continue every 3 months.  -will continue calcium and vit D. -s/p kyphoplasty on 10/18/21. Path showed necrosis, malignancy was not excluded. -he continues on gabapentin and celebrex     PLAN: -2u pRBC in next 3 days -chemo education -port placement in next few weeks -lab and chemo docetaxel on 10/6 and 10/27 -lab and f/u around 10/13 -I spoke with path, and requested FO on his previous biopsy    No problem-specific Assessment & Plan notes found for this encounter.   SUMMARY OF ONCOLOGIC HISTORY: Oncology History Overview Note   Cancer Staging  Prostate cancer metastatic to bone ([facility] form: Prostate, AJCC 8th Edition - Clinical stage from 07/01/2021: Stage IVB (cTX, cNX, pM1b) - Signed by FeTruitt MerleMD on 07/05/2021    Prostate cancer metastatic to bone ([facility] 06/30/2021 Tumor Marker   Patient's tumor was tested for the following markers: PSA. Results of the tumor marker test revealed >3,000.   07/01/2021 Cancer Staging   Staging form: Prostate, AJCC 8th Edition - Clinical stage from 07/01/2021: Stage IVB (cTX, cNX, pM1b) - Signed by FeTruitt MerleMD on 07/05/2021  07/01/2021 Imaging   EXAM: CT CHEST, ABDOMEN, AND PELVIS WITH CONTRAST  IMPRESSION: Widespread osseous metastatic disease of unknown primary. Numerous pathologic fractures involving the ribs bilaterally as well  as the T11, T12, L2, L3, and L4 vertebral bodies. Retropulsion of T11 and L3 results in severe central canal stenosis at L3. Multifactorial severe central canal stenosis also noted at L4, in part related to metastatic disease. Prominent soft tissue component involving the metastasis involving the spinous process of L5 may provide an appropriate target for tissue sampling for further evaluation.   Mild coronary artery calcification.   Thoracic aortic aneurysm with maximal transaxial dimension of 4.9 cm. Ascending thoracic aortic aneurysm. Recommend semi-annual imaging followup by CTA or MRA and referral to cardiothoracic surgery if not already obtained. This recommendation follows 2010 ACCF/AHA/AATS/ACR/ASA/SCA/SCAI/SIR/STS/SVM Guidelines for the Diagnosis and Management of Patients With Thoracic Aortic Disease. Circulation. 2010; 121: T016-W109. Aortic aneurysm NOS (ICD10-I71.9)   Moderate enlargement of the prostate gland. Correlation with serum PSA may be helpful.   07/01/2021 Imaging   EXAM: MRI HEAD WITHOUT CONTRAST  IMPRESSION: 1. No evidence of intracranial metastases on this noncontrast study. 2. Diffusely abnormal bone marrow signal consistent with known widespread osseous metastases.   07/01/2021 Pathology Results   FINAL MICROSCOPIC DIAGNOSIS:   A. SOFT TISSUE MASS, L5, NEEDLE CORE BIOPSY:  -  Metastatic carcinoma  -  See comment   COMMENT:  By immunohistochemistry, the neoplastic cells are positive for PSA and prostein but negative for cytokeratin 7, cytokeratin 20, CDX2 and TTF-1. Overall, the immunoprofile is consistent with a prostatic primary.   07/02/2021 Initial Diagnosis   Prostate cancer metastatic to bone ([place])   07/21/2021 - 08/03/2021 Radiation Therapy   The painful site of metastasis at L5 was treated to 30 Gy in 10 fractions of 3 Gy each.   08/03/2021 Tumor Marker   Patient's tumor was tested for the following markers: PSA. Results of the tumor marker test revealed  76.8.   04/17/2022 -  Chemotherapy   Patient is on Treatment Plan : PROSTATE Docetaxel (74) + Prednisone q21d        INTERVAL HISTORY:  Patrick [name] is here for a follow up of metastatic prostate cancer. He was last seen by me on 01/19/22. He presents to the clinic alone. He reports "I've had better days." He report an occasional/intermittent pain that goes along his low back (sacral area).   All other systems were reviewed with the patient and are negative.  MEDICAL HISTORY:  Past Medical History:  Diagnosis Date   Allergy    seasonal   Arthritis    r knee   H/O inguinal hernia repair    History of tobacco use    HIV infection ([place])    Hyperlipidemia    Hypertension    Strabismic amblyopia     SURGICAL HISTORY: Past Surgical History:  Procedure Laterality Date   EYE SURGERY     strabismus   HERNIA REPAIR     IR BONE TUMOR(S)RF ABLATION  10/18/2021   IR BONE TUMOR(S)RF ABLATION  10/18/2021   IR KYPHO EA ADDL LEVEL THORACIC OR LUMBAR  10/18/2021   IR KYPHO LUMBAR INC FX REDUCE BONE BX UNI/BIL CANNULATION INC/IMAGING  10/18/2021    I have reviewed the social history and family history with the patient and they are unchanged from previous note.  ALLERGIES:  has No Known Allergies.  MEDICATIONS:  Current Outpatient Medications  Medication Sig Dispense Refill   abiraterone acetate (ZYTIGA)  250 MG tablet Take 4 tablets (1,000 mg total) by mouth daily. Take on an empty stomach 1 hour before or 2 hours after a meal 120 tablet 2   albuterol (VENTOLIN HFA) 108 (90 Base) MCG/ACT inhaler Inhale 2 puffs into the lungs every 6 (six) hours as needed for shortness of breath or wheezing.     amLODipine (NORVASC) 5 MG tablet Take 1 tablet (5 mg total) by mouth daily. 30 tablet 11   atorvastatin (LIPITOR) 40 MG tablet TAKE 1 TABLET(40 MG) BY MOUTH DAILY 90 tablet 2   calcium citrate-vitamin D (CITRACAL+D) 315-200 MG-UNIT tablet Take 1 tablet by mouth daily.     celecoxib (CELEBREX)  200 MG capsule Take 1 capsule (200 mg total) by mouth 2 (two) times daily. 30 capsule 1   emtricitabine-rilpivir-tenofovir AF (ODEFSEY) 200-25-25 MG TABS tablet Take 1 tablet by mouth daily. 90 tablet 3   gabapentin (NEURONTIN) 100 MG capsule Take 2-3 capsules (200-300 mg total) by mouth 3 (three) times daily. 180 capsule 1   lidocaine (LIDODERM) 5 % Place 1 patch onto the skin daily. Remove & Discard patch within 12 hours or as directed by MD (Patient not taking: Reported on 08/25/2021) 30 patch 0   Multiple Vitamins-Minerals (MULTIVITAMIN WITH MINERALS) tablet Take 1 tablet by mouth daily.     predniSONE (DELTASONE) 5 MG tablet Take 1 tablet (5 mg total) by mouth daily with breakfast. 30 tablet 2   No current facility-administered medications for this visit.    PHYSICAL EXAMINATION: ECOG PERFORMANCE STATUS: 2 - Symptomatic, <50% confined to bed  Vitals:   03/27/22 1403  BP: 119/79  Pulse: 90  Resp: 18  Temp: 98.6 F (37 C)  SpO2: 97%   Wt Readings from Last 3 Encounters:  03/27/22 167 lb 11.2 oz (76.1 kg)  03/15/22 171 lb 1.6 oz (77.6 kg)  03/01/22 172 lb 1.6 oz (78.1 kg)     GENERAL:alert, no distress and comfortable SKIN: skin color normal, no rashes or significant lesions EYES: normal, Conjunctiva are pink and non-injected, sclera clear  NEURO: alert & oriented x 3 with fluent speech  LABORATORY DATA:  I have reviewed the data as listed    Latest Ref Rng & Units 03/27/2022    1:35 PM 03/01/2022   10:44 AM 02/13/2022    9:24 AM  CBC  WBC 4.0 - 10.5 K/uL 4.1  4.7  2.8   Hemoglobin 13.0 - 17.0 g/dL 6.5  11.9  11.9   Hematocrit 39.0 - 52.0 % 19.4  34.5  33.6   Platelets 150 - 400 K/uL 51  165  150         Latest Ref Rng & Units 03/01/2022   10:44 AM 02/13/2022    9:24 AM 01/19/2022    1:45 PM  CMP  Glucose 70 - 99 mg/dL 99  100  103   BUN 8 - 27 mg/dL '12  14  16   '$ Creatinine 0.76 - 1.27 mg/dL 0.63  0.59  0.62   Sodium 134 - 144 mmol/L 141  139  138   Potassium 3.5 -  5.2 mmol/L 3.9  3.5  3.8   Chloride 96 - 106 mmol/L 102  106  104   CO2 20 - 29 mmol/L '25  28  26   '$ Calcium 8.6 - 10.2 mg/dL 9.5  9.1  9.3   Total Protein 6.0 - 8.5 g/dL 6.5  6.2  6.8   Total Bilirubin 0.0 - 1.2 mg/dL 0.4  0.6  0.7   Alkaline Phos 44 - 121 IU/L 136  73  78   AST 0 - 40 IU/L 36  19  19   ALT 0 - 44 IU/L '13  13  13       '$ RADIOGRAPHIC STUDIES: I have personally reviewed the radiological images as listed and agreed with the findings in the report. No results found.    No orders of the defined types were placed in this encounter.  All questions were answered. The patient knows to call the clinic with any problems, questions or concerns. No barriers to learning was detected. The total time spent in the appointment was 40 minutes.     Truitt Merle, MD 03/27/2022   I, Wilburn Mylar, am acting as scribe for Truitt Merle, MD.   I have reviewed the above documentation for accuracy and completeness, and I agree with the above.

## 2022-03-27 NOTE — Telephone Encounter (Signed)
Placed order for [facility] per request for genetic testing by Dr. Burr Medico.

## 2022-03-27 NOTE — Progress Notes (Signed)
START ON PATHWAY REGIMEN - Prostate ? ? ?  A cycle is every 21 days: ?    Prednisone  ?    Docetaxel  ? ?**Always confirm dose/schedule in your pharmacy ordering system** ? 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 ?Intent of Therapy: ?Non-Curative / Palliative Intent, Discussed with Patient ?

## 2022-03-27 NOTE — Progress Notes (Signed)
gen

## 2022-03-28 ENCOUNTER — Other Ambulatory Visit: Payer: Self-pay

## 2022-03-28 ENCOUNTER — Encounter: Payer: Self-pay | Admitting: Hematology

## 2022-03-28 ENCOUNTER — Inpatient Hospital Stay: Payer: PPO

## 2022-03-28 ENCOUNTER — Other Ambulatory Visit (HOSPITAL_COMMUNITY): Payer: Self-pay

## 2022-03-28 ENCOUNTER — Ambulatory Visit: Payer: PPO

## 2022-03-28 LAB — RESEARCH LABS

## 2022-03-28 MED ORDER — PROCHLORPERAZINE MALEATE 10 MG PO TABS
10.0000 mg | ORAL_TABLET | Freq: Four times a day (QID) | ORAL | 1 refills | Status: DC | PRN
  Filled 2022-03-28: qty 30, 8d supply, fill #0

## 2022-03-28 MED ORDER — PREDNISONE 5 MG PO TABS
5.0000 mg | ORAL_TABLET | Freq: Two times a day (BID) | ORAL | 4 refills | Status: DC
  Filled 2022-03-28: qty 60, 30d supply, fill #0
  Filled 2022-04-27: qty 60, 30d supply, fill #1
  Filled 2022-05-26: qty 60, 30d supply, fill #2
  Filled 2022-06-26: qty 60, 30d supply, fill #3
  Filled 2022-07-27: qty 60, 30d supply, fill #4

## 2022-03-28 MED ORDER — ONDANSETRON HCL 8 MG PO TABS
8.0000 mg | ORAL_TABLET | Freq: Three times a day (TID) | ORAL | 1 refills | Status: DC | PRN
  Filled 2022-03-28: qty 30, 10d supply, fill #0

## 2022-03-28 MED ORDER — LIDOCAINE-PRILOCAINE 2.5-2.5 % EX CREA
TOPICAL_CREAM | CUTANEOUS | 3 refills | Status: DC
  Filled 2022-03-28: qty 30, 30d supply, fill #0

## 2022-03-28 MED ORDER — DEXAMETHASONE 4 MG PO TABS
ORAL_TABLET | ORAL | 1 refills | Status: DC
  Filled 2022-03-28: qty 30, 15d supply, fill #0
  Filled 2022-07-27: qty 30, 15d supply, fill #1

## 2022-03-29 ENCOUNTER — Inpatient Hospital Stay: Payer: PPO

## 2022-03-29 ENCOUNTER — Other Ambulatory Visit: Payer: Self-pay

## 2022-03-29 DIAGNOSIS — Z5111 Encounter for antineoplastic chemotherapy: Secondary | ICD-10-CM | POA: Diagnosis not present

## 2022-03-29 DIAGNOSIS — C7951 Secondary malignant neoplasm of bone: Secondary | ICD-10-CM

## 2022-03-29 DIAGNOSIS — D649 Anemia, unspecified: Secondary | ICD-10-CM

## 2022-03-29 MED ORDER — SODIUM CHLORIDE 0.9% IV SOLUTION
250.0000 mL | Freq: Once | INTRAVENOUS | Status: AC
  Administered 2022-03-29: 250 mL via INTRAVENOUS

## 2022-03-29 MED ORDER — DIPHENHYDRAMINE HCL 25 MG PO CAPS
25.0000 mg | ORAL_CAPSULE | Freq: Once | ORAL | Status: AC
  Administered 2022-03-29: 25 mg via ORAL
  Filled 2022-03-29: qty 1

## 2022-03-29 MED ORDER — ACETAMINOPHEN 325 MG PO TABS
650.0000 mg | ORAL_TABLET | Freq: Once | ORAL | Status: AC
  Administered 2022-03-29: 650 mg via ORAL
  Filled 2022-03-29: qty 2

## 2022-03-29 NOTE — Patient Instructions (Signed)

## 2022-03-30 ENCOUNTER — Inpatient Hospital Stay: Payer: PPO

## 2022-03-30 LAB — BPAM RBC
Blood Product Expiration Date: 202310182359
Blood Product Expiration Date: 202310192359
ISSUE DATE / TIME: 202310040906
ISSUE DATE / TIME: 202310040906
Unit Type and Rh: 600
Unit Type and Rh: 600

## 2022-03-30 LAB — TYPE AND SCREEN
ABO/RH(D): A NEG
Antibody Screen: NEGATIVE
Unit division: 0
Unit division: 0

## 2022-03-30 MED FILL — Dexamethasone Sodium Phosphate Inj 100 MG/10ML: INTRAMUSCULAR | Qty: 1 | Status: AC

## 2022-03-31 ENCOUNTER — Inpatient Hospital Stay: Payer: PPO

## 2022-03-31 VITALS — BP 151/84 | HR 74 | Temp 98.1°F | Resp 18 | Wt 161.8 lb

## 2022-03-31 DIAGNOSIS — C61 Malignant neoplasm of prostate: Secondary | ICD-10-CM

## 2022-03-31 DIAGNOSIS — Z5111 Encounter for antineoplastic chemotherapy: Secondary | ICD-10-CM | POA: Diagnosis not present

## 2022-03-31 LAB — CBC WITH DIFFERENTIAL (CANCER CENTER ONLY)
Abs Immature Granulocytes: 0.49 10*3/uL — ABNORMAL HIGH (ref 0.00–0.07)
Basophils Absolute: 0.1 10*3/uL (ref 0.0–0.1)
Basophils Relative: 1 %
Eosinophils Absolute: 0 10*3/uL (ref 0.0–0.5)
Eosinophils Relative: 1 %
HCT: 23.8 % — ABNORMAL LOW (ref 39.0–52.0)
Hemoglobin: 8 g/dL — ABNORMAL LOW (ref 13.0–17.0)
Immature Granulocytes: 8 %
Lymphocytes Relative: 13 %
Lymphs Abs: 0.8 10*3/uL (ref 0.7–4.0)
MCH: 30.8 pg (ref 26.0–34.0)
MCHC: 33.6 g/dL (ref 30.0–36.0)
MCV: 91.5 fL (ref 80.0–100.0)
Monocytes Absolute: 0.6 10*3/uL (ref 0.1–1.0)
Monocytes Relative: 10 %
Neutro Abs: 4.1 10*3/uL (ref 1.7–7.7)
Neutrophils Relative %: 67 %
Platelet Count: 50 10*3/uL — ABNORMAL LOW (ref 150–400)
RBC: 2.6 MIL/uL — ABNORMAL LOW (ref 4.22–5.81)
RDW: 16.2 % — ABNORMAL HIGH (ref 11.5–15.5)
WBC Count: 6.1 10*3/uL (ref 4.0–10.5)
nRBC: 12 % — ABNORMAL HIGH (ref 0.0–0.2)

## 2022-03-31 LAB — CMP (CANCER CENTER ONLY)
ALT: 14 U/L (ref 0–44)
AST: 54 U/L — ABNORMAL HIGH (ref 15–41)
Albumin: 4.2 g/dL (ref 3.5–5.0)
Alkaline Phosphatase: 116 U/L (ref 38–126)
Anion gap: 12 (ref 5–15)
BUN: 18 mg/dL (ref 8–23)
CO2: 23 mmol/L (ref 22–32)
Calcium: 9.6 mg/dL (ref 8.9–10.3)
Chloride: 103 mmol/L (ref 98–111)
Creatinine: 0.92 mg/dL (ref 0.61–1.24)
GFR, Estimated: >60 mL/min (ref >60)
Glucose, Bld: 175 mg/dL — ABNORMAL HIGH (ref 70–99)
Potassium: 4.6 mmol/L (ref 3.5–5.1)
Sodium: 138 mmol/L (ref 135–145)
Total Bilirubin: 0.7 mg/dL (ref 0.3–1.2)
Total Protein: 6.4 g/dL — ABNORMAL LOW (ref 6.5–8.1)

## 2022-03-31 MED ORDER — SODIUM CHLORIDE 0.9 % IV SOLN: Freq: Once | INTRAVENOUS | Status: AC

## 2022-03-31 MED ORDER — SODIUM CHLORIDE 0.9 % IV SOLN
60.0000 mg/m2 | Freq: Once | INTRAVENOUS | Status: AC
  Administered 2022-03-31: 120 mg via INTRAVENOUS
  Filled 2022-03-31: qty 12

## 2022-03-31 MED ORDER — SODIUM CHLORIDE 0.9 % IV SOLN
10.0000 mg | Freq: Once | INTRAVENOUS | Status: AC
  Administered 2022-03-31: 10 mg via INTRAVENOUS
  Filled 2022-03-31: qty 10

## 2022-03-31 NOTE — Patient Instructions (Signed)
[facility] MEDICAL ONCOLOGY  Discharge Instructions: Thank you for choosing [facility] to provide your oncology and hematology care.   If you have a lab appointment with the Cancer Center, please go directly to the Cancer Center and check in at the registration area.   Wear comfortable clothing and clothing appropriate for easy access to any Portacath or PICC line.   We strive to give you quality time with your provider. You may need to reschedule your appointment if you arrive late (15 or more minutes).  Arriving late affects you and other patients whose appointments are after yours.  Also, if you miss three or more appointments without notifying the office, you may be dismissed from the clinic at the provider's discretion.      For prescription refill requests, have your pharmacy contact our office and allow 72 hours for refills to be completed.    Today you received the following chemotherapy and/or immunotherapy agents: Docetaxel      To help prevent nausea and vomiting after your treatment, we encourage you to take your nausea medication as directed.  BELOW ARE SYMPTOMS THAT SHOULD BE REPORTED IMMEDIATELY: *FEVER GREATER THAN 100.4 F (38 C) OR HIGHER *CHILLS OR SWEATING *NAUSEA AND VOMITING THAT IS NOT CONTROLLED WITH YOUR NAUSEA MEDICATION *UNUSUAL SHORTNESS OF BREATH *UNUSUAL BRUISING OR BLEEDING *URINARY PROBLEMS (pain or burning when urinating, or frequent urination) *BOWEL PROBLEMS (unusual diarrhea, constipation, pain near the anus) TENDERNESS IN MOUTH AND THROAT WITH OR WITHOUT PRESENCE OF ULCERS (sore throat, sores in mouth, or a toothache) UNUSUAL RASH, SWELLING OR PAIN  UNUSUAL VAGINAL DISCHARGE OR ITCHING   Items with * indicate a potential emergency and should be followed up as soon as possible or go to the Emergency Department if any problems should occur.  Please show the CHEMOTHERAPY ALERT CARD or IMMUNOTHERAPY ALERT CARD at check-in to  the Emergency Department and triage nurse.  Should you have questions after your visit or need to cancel or reschedule your appointment, please contact [facility]  Dept: [phone]  and follow the prompts.  Office hours are 8:00 a.m. to 4:30 p.m. Monday - Friday. Please note that voicemails left after 4:00 p.m. may not be returned until the following business day.  We are closed weekends and major holidays. You have access to a nurse at all times for urgent questions. Please call the main number to the clinic Dept: [phone] and follow the prompts.   For any non-urgent questions, you may also contact your provider using MyChart. We now offer e-Visits for anyone 18 and older to request care online for non-urgent symptoms. For details visit [email or web address].   Also download the MyChart app! Go to the app store, search "MyChart", open the app, select [facility] [place], and log in with your MyChart username and password.  Masks are optional in the cancer centers. If you would like for your care team to wear a mask while they are taking care of you, please let them know. You may have one support person who is at least 72 years old accompany you for your appointments.  Docetaxel Injection What is this medication? DOCETAXEL (doe se TAX el) treats some types of cancer. It works by slowing down the growth of cancer cells. This medicine may be used for other purposes; ask your health care provider or pharmacist if you have questions. COMMON BRAND NAME(S): Docefrez, Taxotere What should I tell my care team before I take this   medication? They need to know if you have any of these conditions: Kidney disease Liver disease Low white blood cell levels Tingling of the fingers or toes or other nerve disorder An unusual or allergic reaction to docetaxel, polysorbate 80, other medications, foods, dyes, or preservatives Pregnant or trying to get pregnant Breast-feeding How  should I use this medication? This medication is injected into a vein. It is given by your care team in a hospital or clinic setting. Talk to your care team about the use of this medication in children. Special care may be needed. Overdosage: If you think you have taken too much of this medicine contact a poison control center or emergency room at once. NOTE: This medicine is only for you. Do not share this medicine with others. What if I miss a dose? Keep appointments for follow-up doses. It is important not to miss your dose. Call your care team if you are unable to keep an appointment. What may interact with this medication? Do not take this medication with any of the following: Live virus vaccines This medication may also interact with the following: Certain antibiotics, such as clarithromycin, telithromycin Certain antivirals for HIV or hepatitis Certain medications for fungal infections, such as itraconazole, ketoconazole, voriconazole Grapefruit juice Nefazodone Supplements, such as St. John's wort This list may not describe all possible interactions. Give your health care provider a list of all the medicines, herbs, non-prescription drugs, or dietary supplements you use. Also tell them if you smoke, drink alcohol, or use illegal drugs. Some items may interact with your medicine. What should I watch for while using this medication? This medication may make you feel generally unwell. This is not uncommon as chemotherapy can affect healthy cells as well as cancer cells. Report any side effects. Continue your course of treatment even though you feel ill unless your care team tells you to stop. You may need blood work done while you are taking this medication. This medication can cause serious side effects and infusion reactions. To reduce the risk, your care team may give you other medications to take before receiving this one. Be sure to follow the directions from your care team. This  medication may increase your risk of getting an infection. Call your care team for advice if you get a fever, chills, sore throat, or other symptoms of a cold or flu. Do not treat yourself. Try to avoid being around people who are sick. Avoid taking medications that contain aspirin, acetaminophen, ibuprofen, naproxen, or ketoprofen unless instructed by your care team. These medications may hide a fever. Be careful brushing or flossing your teeth or using a toothpick because you may get an infection or bleed more easily. If you have any dental work done, tell your dentist you are receiving this medication. Some products may contain alcohol. Ask your care team if this medication contains alcohol. Be sure to tell all care teams you are taking this medicine. Certain medications, like metronidazole and disulfiram, can cause an unpleasant reaction when taken with alcohol. The reaction includes flushing, headache, nausea, vomiting, sweating, and increased thirst. The reaction can last from 30 minutes to several hours. This medication may affect your coordination, reaction time, or judgement. Do not drive or operate machinery until you know how this medication affects you. Sit up or stand slowly to reduce the risk of dizzy or fainting spells. Drinking alcohol with this medication can increase the risk of these side effects. Talk to your care team about your risk of   cancer. You may be more at risk for certain types of cancer if you take this medication. Talk to your care team if you wish to become pregnant or think you might be pregnant. This medication can cause serious birth defects if taken during pregnancy or if you get pregnant within 2 months after stopping therapy. A negative pregnancy test is required before starting this medication. A reliable form of contraception is recommended while taking this medication and for 2 months after stopping it. Talk to your care team about reliable forms of contraception. Do  not breast-feed while taking this medication and for 1 week after stopping therapy. Use a condom during sex and for 4 months after stopping therapy. Tell your care team right away if you think your partner might be pregnant. This medication can cause serious birth defects. This medication may cause infertility. Talk to your care team if you are concerned about your fertility. What side effects may I notice from receiving this medication? Side effects that you should report to your care team as soon as possible: Allergic reactions--skin rash, itching, hives, swelling of the face, lips, tongue, or throat Change in vision such as blurry vision, seeing halos around lights, vision loss Infection--fever, chills, cough, or sore throat Infusion reactions--chest pain, shortness of breath or trouble breathing, feeling faint or lightheaded Low red blood cell level--unusual weakness or fatigue, dizziness, headache, trouble breathing Pain, tingling, or numbness in the hands or feet Painful swelling, warmth, or redness of the skin, blisters or sores at the infusion site Redness, blistering, peeling, or loosening of the skin, including inside the mouth Sudden or severe stomach pain, bloody diarrhea, fever, nausea, vomiting Swelling of the ankles, hands, or feet Tumor lysis syndrome (TLS)--nausea, vomiting, diarrhea, decrease in the amount of urine, dark urine, unusual weakness or fatigue, confusion, muscle pain or cramps, fast or irregular heartbeat, joint pain Unusual bruising or bleeding Side effects that usually do not require medical attention (report to your care team if they continue or are bothersome): Change in nail shape, thickness, or color Change in taste Hair loss Increased tears This list may not describe all possible side effects. Call your doctor for medical advice about side effects. You may report side effects to FDA at 1-800-FDA-1088. Where should I keep my medication? This medication is  given in a hospital or clinic. It will not be stored at home. NOTE: This sheet is a summary. It may not cover all possible information. If you have questions about this medicine, talk to your doctor, pharmacist, or health care provider.  2023 Elsevier/Gold Standard (2021-08-12 00:00:00)  

## 2022-03-31 NOTE — Progress Notes (Signed)
Per Burr Medico MD, ok to treat today with PLT 50.

## 2022-04-02 LAB — TESTOSTERONE, FREE, TOTAL, SHBG
Sex Hormone Binding: 68.6 nmol/L (ref 19.3–76.4)
Testosterone, Free: <0.2 pg/mL — ABNORMAL LOW (ref 6.6–18.1)
Testosterone: <3 ng/dL — ABNORMAL LOW (ref 264–916)

## 2022-04-03 ENCOUNTER — Inpatient Hospital Stay: Payer: PPO

## 2022-04-03 ENCOUNTER — Telehealth: Payer: Self-pay

## 2022-04-03 VITALS — BP 129/91 | HR 91 | Resp 16

## 2022-04-03 DIAGNOSIS — C7951 Secondary malignant neoplasm of bone: Secondary | ICD-10-CM

## 2022-04-03 DIAGNOSIS — Z5111 Encounter for antineoplastic chemotherapy: Secondary | ICD-10-CM | POA: Diagnosis not present

## 2022-04-03 DIAGNOSIS — M6281 Muscle weakness (generalized): Secondary | ICD-10-CM | POA: Diagnosis not present

## 2022-04-03 DIAGNOSIS — M6258 Muscle wasting and atrophy, not elsewhere classified, other site: Secondary | ICD-10-CM | POA: Diagnosis not present

## 2022-04-03 MED ORDER — PEGFILGRASTIM-CBQV 6 MG/0.6ML ~~LOC~~ SOSY
6.0000 mg | PREFILLED_SYRINGE | Freq: Once | SUBCUTANEOUS | Status: AC
  Administered 2022-04-03: 6 mg via SUBCUTANEOUS
  Filled 2022-04-03: qty 0.6

## 2022-04-03 NOTE — Patient Instructions (Signed)

## 2022-04-03 NOTE — Telephone Encounter (Signed)
[name] states that he is doing fine.  He is eating, drinking, and urinating well.  He knows to call the office at [phone] if he has any questions or concerns.

## 2022-04-03 NOTE — Telephone Encounter (Signed)
-----   Message from Rafael Bihari, RN sent at 03/31/2022 12:28 PM EDT ----- Regarding: Dr Burr Medico pt, first time Docetaxel. Pt came in 10/6 for first time Docetaxel. Tolerated infusion well. Needs call back.

## 2022-04-04 ENCOUNTER — Other Ambulatory Visit (HOSPITAL_COMMUNITY): Payer: Self-pay

## 2022-04-07 ENCOUNTER — Inpatient Hospital Stay: Payer: PPO | Admitting: Hematology

## 2022-04-07 ENCOUNTER — Encounter: Payer: Self-pay | Admitting: Adult Health

## 2022-04-07 ENCOUNTER — Other Ambulatory Visit: Payer: Self-pay

## 2022-04-07 ENCOUNTER — Inpatient Hospital Stay (HOSPITAL_BASED_OUTPATIENT_CLINIC_OR_DEPARTMENT_OTHER): Payer: PPO | Admitting: Adult Health

## 2022-04-07 DIAGNOSIS — C7951 Secondary malignant neoplasm of bone: Secondary | ICD-10-CM

## 2022-04-07 DIAGNOSIS — C61 Malignant neoplasm of prostate: Secondary | ICD-10-CM | POA: Diagnosis not present

## 2022-04-07 DIAGNOSIS — Z5111 Encounter for antineoplastic chemotherapy: Secondary | ICD-10-CM | POA: Diagnosis not present

## 2022-04-07 LAB — CBC WITH DIFFERENTIAL/PLATELET
Abs Immature Granulocytes: 0.78 10*3/uL — ABNORMAL HIGH (ref 0.00–0.07)
Basophils Absolute: 0.1 10*3/uL (ref 0.0–0.1)
Basophils Relative: 1 %
Eosinophils Absolute: 0 10*3/uL (ref 0.0–0.5)
Eosinophils Relative: 1 %
HCT: 22.7 % — ABNORMAL LOW (ref 39.0–52.0)
Hemoglobin: 7.6 g/dL — ABNORMAL LOW (ref 13.0–17.0)
Immature Granulocytes: 9 %
Lymphocytes Relative: 12 %
Lymphs Abs: 1 10*3/uL (ref 0.7–4.0)
MCH: 30.6 pg (ref 26.0–34.0)
MCHC: 33.5 g/dL (ref 30.0–36.0)
MCV: 91.5 fL (ref 80.0–100.0)
Monocytes Absolute: 2 10*3/uL — ABNORMAL HIGH (ref 0.1–1.0)
Monocytes Relative: 23 %
Neutro Abs: 4.6 10*3/uL (ref 1.7–7.7)
Neutrophils Relative %: 54 %
Platelets: 60 10*3/uL — ABNORMAL LOW (ref 150–400)
RBC: 2.48 MIL/uL — ABNORMAL LOW (ref 4.22–5.81)
RDW: 15.3 % (ref 11.5–15.5)
WBC: 8.5 10*3/uL (ref 4.0–10.5)
nRBC: 3.9 % — ABNORMAL HIGH (ref 0.0–0.2)

## 2022-04-07 LAB — COMPREHENSIVE METABOLIC PANEL
ALT: 15 U/L (ref 0–44)
AST: 25 U/L (ref 15–41)
Albumin: 4.1 g/dL (ref 3.5–5.0)
Alkaline Phosphatase: 136 U/L — ABNORMAL HIGH (ref 38–126)
Anion gap: 7 (ref 5–15)
BUN: 19 mg/dL (ref 8–23)
CO2: 26 mmol/L (ref 22–32)
Calcium: 9 mg/dL (ref 8.9–10.3)
Chloride: 103 mmol/L (ref 98–111)
Creatinine, Ser: 0.75 mg/dL (ref 0.61–1.24)
GFR, Estimated: >60 mL/min (ref >60)
Glucose, Bld: 126 mg/dL — ABNORMAL HIGH (ref 70–99)
Potassium: 4.8 mmol/L (ref 3.5–5.1)
Sodium: 136 mmol/L (ref 135–145)
Total Bilirubin: 0.8 mg/dL (ref 0.3–1.2)
Total Protein: 6.1 g/dL — ABNORMAL LOW (ref 6.5–8.1)

## 2022-04-07 NOTE — Progress Notes (Unsigned)
[facility] Follow up:    [name], MD [place] [address]   DIAGNOSIS: Cancer Staging  Prostate cancer metastatic to bone [facility]) Staging form: Prostate, AJCC 8th Edition - Clinical stage from 07/01/2021: Stage IVB (cTX, cNX, pM1b) - Signed by Truitt Merle, MD on 07/05/2021   SUMMARY OF ONCOLOGIC HISTORY: Oncology History Overview Note   Cancer Staging  Prostate cancer metastatic to bone [facility]) Staging form: Prostate, AJCC 8th Edition - Clinical stage from 07/01/2021: Stage IVB (cTX, cNX, pM1b) - Signed by Truitt Merle, MD on 07/05/2021    Prostate cancer metastatic to bone [facility])  06/30/2021 Tumor Marker   Patient's tumor was tested for the following markers: PSA. Results of the tumor marker test revealed >3,000.   07/01/2021 Cancer Staging   Staging form: Prostate, AJCC 8th Edition - Clinical stage from 07/01/2021: Stage IVB (cTX, cNX, pM1b) - Signed by Truitt Merle, MD on 07/05/2021   07/01/2021 Imaging   EXAM: CT CHEST, ABDOMEN, AND PELVIS WITH CONTRAST  IMPRESSION: Widespread osseous metastatic disease of unknown primary. Numerous pathologic fractures involving the ribs bilaterally as well as the T11, T12, L2, L3, and L4 vertebral bodies. Retropulsion of T11 and L3 results in severe central canal stenosis at L3. Multifactorial severe central canal stenosis also noted at L4, in part related to metastatic disease. Prominent soft tissue component involving the metastasis involving the spinous process of L5 may provide an appropriate target for tissue sampling for further evaluation.   Mild coronary artery calcification.   Thoracic aortic aneurysm with maximal transaxial dimension of 4.9 cm. Ascending thoracic aortic aneurysm. Recommend semi-annual imaging followup by CTA or MRA and referral to cardiothoracic surgery if not already obtained. This recommendation follows 2010 ACCF/AHA/AATS/ACR/ASA/SCA/SCAI/SIR/STS/SVM Guidelines for the Diagnosis and  Management of Patients With Thoracic Aortic Disease. Circulation. 2010; 121: D638-V564. Aortic aneurysm NOS (ICD10-I71.9)   Moderate enlargement of the prostate gland. Correlation with serum PSA may be helpful.   07/01/2021 Imaging   EXAM: MRI HEAD WITHOUT CONTRAST  IMPRESSION: 1. No evidence of intracranial metastases on this noncontrast study. 2. Diffusely abnormal bone marrow signal consistent with known widespread osseous metastases.   07/01/2021 Pathology Results   FINAL MICROSCOPIC DIAGNOSIS:   A. SOFT TISSUE MASS, L5, NEEDLE CORE BIOPSY:  -  Metastatic carcinoma  -  See comment   COMMENT:  By immunohistochemistry, the neoplastic cells are positive for PSA and prostein but negative for cytokeratin 7, cytokeratin 20, CDX2 and TTF-1. Overall, the immunoprofile is consistent with a prostatic primary.   07/02/2021 Initial Diagnosis   Prostate cancer metastatic to bone ([place])   07/21/2021 - 08/03/2021 Radiation Therapy   The painful site of metastasis at L5 was treated to 30 Gy in 10 fractions of 3 Gy each.   08/03/2021 Tumor Marker   Patient's tumor was tested for the following markers: PSA. Results of the tumor marker test revealed 76.8.   03/31/2022 -  Chemotherapy   Patient is on Treatment Plan : PROSTATE Docetaxel (74) + Prednisone q21d       CURRENT THERAPY:Docetaxel/Prednisone  INTERVAL HISTORY: Patrick [name] 71 y.o. male returns for follow-up and evaluation of his metastatic prostate cancer being treated currently with docetaxel and prednisone.  He receives his treatment on day 1 of a 21-day cycle.  Today is cycle 1 day 8 of therapy.     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  MEDICAL HISTORY: Past Medical History:  Diagnosis Date   Allergy    seasonal   Arthritis    r knee   H/O inguinal hernia repair    History of tobacco use    HIV infection ([place])    Hyperlipidemia    Hypertension    Strabismic amblyopia     SURGICAL HISTORY: Past Surgical History:  Procedure Laterality Date   EYE SURGERY     strabismus   HERNIA REPAIR     IR BONE TUMOR(S)RF ABLATION  10/18/2021   IR BONE TUMOR(S)RF ABLATION  10/18/2021   IR KYPHO EA ADDL LEVEL THORACIC OR LUMBAR  10/18/2021   IR KYPHO LUMBAR INC FX REDUCE BONE BX UNI/BIL CANNULATION INC/IMAGING  10/18/2021    SOCIAL HISTORY: Social History   Socioeconomic History   Marital status: Single    Spouse name: Not on file   Number of children: Not on file   Years of education: Not on file   Highest education level: Not on file  Occupational History   Not on file  Tobacco Use   Smoking status: Former   Smokeless tobacco: Former    Types: Chew    Quit date: 1998  Vaping Use   Vaping Use: Never used  Substance and Sexual Activity   Alcohol use: Yes    Alcohol/week: 1.0 standard drink of alcohol    Types: 1 Cans of beer per week    Comment: occ   Drug use: No   Sexual activity: Never    Comment: pt. declined condoms  Other Topics Concern   Not on file  Social History Narrative   Not on file   Social Determinants of Health   Financial Resource Strain: Not on file  Food Insecurity: Not on file  Transportation Needs: Not on file  Physical Activity: Not on file  Stress: Not on file  Social  Connections: Not on file  Intimate Partner Violence: Not on file    FAMILY HISTORY: Family History  Problem Relation Age of Onset   Colon polyps Mother    Heart disease Mother    Heart disease Father    Kidney disease Father    Lung cancer Father    Diabetes Maternal Grandmother    Colon cancer Neg Hx     Review of Systems - Oncology    PHYSICAL EXAMINATION  ECOG PERFORMANCE STATUS: {CHL ONC ECOG UD:[id number]}  Vitals:   04/07/22 1336 04/07/22 1337  BP: 96/66 (!) 98/58  Pulse: 86   Temp: 99.3 F (37.4 C)   SpO2: 100%     Physical Exam  LABORATORY DATA:  CBC    Component Value Date/Time   WBC 8.5 04/07/2022 1257   RBC 2.48 (L) 04/07/2022 1257   HGB 7.6 (L) 04/07/2022 1257   HGB 8.0 (L) 03/31/2022 0819   HGB 11.9 (L) 03/01/2022 1044   HCT 22.7 (  L) 04/07/2022 1257   HCT 34.5 (L) 03/01/2022 1044   PLT 60 (L) 04/07/2022 1257   PLT 50 (L) 03/31/2022 0819   PLT 165 03/01/2022 1044   MCV 91.5 04/07/2022 1257   MCV 94 03/01/2022 1044   MCH 30.6 04/07/2022 1257   MCHC 33.5 04/07/2022 1257   RDW 15.3 04/07/2022 1257   RDW 13.1 03/01/2022 1044   LYMPHSABS PENDING 04/07/2022 1257   MONOABS PENDING 04/07/2022 1257   EOSABS PENDING 04/07/2022 1257   BASOSABS PENDING 04/07/2022 1257    CMP     Component Value Date/Time   NA 138 03/31/2022 0819   NA 141 03/01/2022 1044   K 4.6 03/31/2022 0819   CL 103 03/31/2022 0819   CO2 23 03/31/2022 0819   GLUCOSE 175 (H) 03/31/2022 0819   BUN 18 03/31/2022 0819   BUN 12 03/01/2022 1044   CREATININE 0.92 03/31/2022 0819   CREATININE 0.79 09/03/2020 0849   CALCIUM 9.6 03/31/2022 0819   PROT 6.4 (L) 03/31/2022 0819   PROT 6.5 03/01/2022 1044   ALBUMIN 4.2 03/31/2022 0819   ALBUMIN 4.4 03/01/2022 1044   AST 54 (H) 03/31/2022 0819   ALT 14 03/31/2022 0819   ALKPHOS 116 03/31/2022 0819   BILITOT 0.7 03/31/2022 0819   GFRNONAA >60 03/31/2022 0819   GFRNONAA >60 09/21/2010 1042   GFRAA 108 02/18/2020 1014   GFRAA >60  09/21/2010 1042       PENDING LABS:   RADIOGRAPHIC STUDIES:  No results found.   PATHOLOGY:     ASSESSMENT and THERAPY PLAN:   No problem-specific Assessment & Plan notes found for this encounter.   No orders of the defined types were placed in this encounter.   All questions were answered. The patient knows to call the clinic with any problems, questions or concerns. We can certainly see the patient much sooner if necessary. This note was electronically signed. Scot Dock, NP 04/07/2022

## 2022-04-08 LAB — PROSTATE-SPECIFIC AG, SERUM (LABCORP): Prostate Specific Ag, Serum: 1301 ng/mL — ABNORMAL HIGH (ref 0.0–4.0)

## 2022-04-12 ENCOUNTER — Other Ambulatory Visit: Payer: Self-pay

## 2022-04-12 ENCOUNTER — Telehealth: Payer: Self-pay

## 2022-04-12 ENCOUNTER — Inpatient Hospital Stay: Payer: PPO

## 2022-04-12 ENCOUNTER — Encounter: Payer: Self-pay | Admitting: Hematology

## 2022-04-12 DIAGNOSIS — D649 Anemia, unspecified: Secondary | ICD-10-CM

## 2022-04-12 DIAGNOSIS — C61 Malignant neoplasm of prostate: Secondary | ICD-10-CM

## 2022-04-12 DIAGNOSIS — Z5111 Encounter for antineoplastic chemotherapy: Secondary | ICD-10-CM | POA: Diagnosis not present

## 2022-04-12 LAB — PREPARE RBC (CROSSMATCH)

## 2022-04-12 LAB — COMPREHENSIVE METABOLIC PANEL
ALT: 12 U/L (ref 0–44)
AST: 29 U/L (ref 15–41)
Albumin: 4.2 g/dL (ref 3.5–5.0)
Alkaline Phosphatase: 198 U/L — ABNORMAL HIGH (ref 38–126)
Anion gap: 7 (ref 5–15)
BUN: 15 mg/dL (ref 8–23)
CO2: 24 mmol/L (ref 22–32)
Calcium: 8.9 mg/dL (ref 8.9–10.3)
Chloride: 107 mmol/L (ref 98–111)
Creatinine, Ser: 0.69 mg/dL (ref 0.61–1.24)
GFR, Estimated: >60 mL/min (ref >60)
Glucose, Bld: 104 mg/dL — ABNORMAL HIGH (ref 70–99)
Potassium: 4 mmol/L (ref 3.5–5.1)
Sodium: 138 mmol/L (ref 135–145)
Total Bilirubin: 0.7 mg/dL (ref 0.3–1.2)
Total Protein: 6.3 g/dL — ABNORMAL LOW (ref 6.5–8.1)

## 2022-04-12 LAB — CBC WITH DIFFERENTIAL (CANCER CENTER ONLY)
Abs Immature Granulocytes: 1.31 10*3/uL — ABNORMAL HIGH (ref 0.00–0.07)
Basophils Absolute: 0.1 10*3/uL (ref 0.0–0.1)
Basophils Relative: 1 %
Eosinophils Absolute: 0.1 10*3/uL (ref 0.0–0.5)
Eosinophils Relative: 0 %
HCT: 22.4 % — ABNORMAL LOW (ref 39.0–52.0)
Hemoglobin: 7.4 g/dL — ABNORMAL LOW (ref 13.0–17.0)
Immature Granulocytes: 11 %
Lymphocytes Relative: 9 %
Lymphs Abs: 1.2 10*3/uL (ref 0.7–4.0)
MCH: 31 pg (ref 26.0–34.0)
MCHC: 33 g/dL (ref 30.0–36.0)
MCV: 93.7 fL (ref 80.0–100.0)
Monocytes Absolute: 1.5 10*3/uL — ABNORMAL HIGH (ref 0.1–1.0)
Monocytes Relative: 12 %
Neutro Abs: 8.4 10*3/uL — ABNORMAL HIGH (ref 1.7–7.7)
Neutrophils Relative %: 67 %
Platelet Count: 53 10*3/uL — ABNORMAL LOW (ref 150–400)
RBC: 2.39 MIL/uL — ABNORMAL LOW (ref 4.22–5.81)
RDW: 15.9 % — ABNORMAL HIGH (ref 11.5–15.5)
WBC Count: 12.5 10*3/uL — ABNORMAL HIGH (ref 4.0–10.5)
nRBC: 4.6 % — ABNORMAL HIGH (ref 0.0–0.2)

## 2022-04-12 LAB — SAMPLE TO BLOOD BANK

## 2022-04-12 NOTE — Telephone Encounter (Signed)
Spoke with pt via telephone regarding coming in for labs and possible blood transfusion.  Pt stated that he has a dental appt this afternoon at 3pm and does not know if he can come today.  Asked pt if he could at least come in today just for labs and possibly come in on Thursday or Friday if the lab results indicate a blood transfusion is needed.  Pt agreed with plan.  Pt stated he could come in today at 11:30 am for labs.  Scheduled pt for lab appt.  Will check with Infusion and [facility] to see if they have availability later this week for blood transfusion.

## 2022-04-12 NOTE — Assessment & Plan Note (Signed)
Patrick [name] is a 71 year old man with metastatic prostate cancer here today for follow-up and evaluation 1 week after receiving docetaxel and prednisone.    I reviewed Terry's labs with him and it appears he tolerated treatment relatively well.  His hemoglobin is 7.6 today however he does not have any significant fatigue or other symptoms to the point where he feels like he needs a blood transfusion.  We discussed ways to help combat his fatigue.  I do notice that he has lost some weight and continue to recommend Ensure and meeting with nutrition to discuss oral intake.  He will return in 2 weeks for labs, follow-up with Dr. Burr Medico.

## 2022-04-12 NOTE — Telephone Encounter (Signed)
LVM informing pt that he will need 1 unit of PRBCs and that his appt is tomorrow 04/13/2022 at 11am in the [facility].

## 2022-04-12 NOTE — Progress Notes (Signed)
Spoke with Nikki in the Blood Bank to confirm T&S and Prepare orders.  Nikki confirmed both.  Notified Infusion Charge Nurse "Lexine Baton" that the pt will on need 1 unit of PRBCs

## 2022-04-13 ENCOUNTER — Ambulatory Visit: Payer: PPO

## 2022-04-13 ENCOUNTER — Encounter (HOSPITAL_COMMUNITY): Payer: Self-pay

## 2022-04-13 ENCOUNTER — Inpatient Hospital Stay: Payer: PPO

## 2022-04-13 DIAGNOSIS — Z5111 Encounter for antineoplastic chemotherapy: Secondary | ICD-10-CM | POA: Diagnosis not present

## 2022-04-13 DIAGNOSIS — C7951 Secondary malignant neoplasm of bone: Secondary | ICD-10-CM

## 2022-04-13 DIAGNOSIS — D649 Anemia, unspecified: Secondary | ICD-10-CM

## 2022-04-13 LAB — PROSTATE-SPECIFIC AG, SERUM (LABCORP): Prostate Specific Ag, Serum: 1320 ng/mL — ABNORMAL HIGH (ref 0.0–4.0)

## 2022-04-13 MED ORDER — SODIUM CHLORIDE 0.9% IV SOLUTION: 250.0000 mL | Freq: Once | INTRAVENOUS | Status: DC

## 2022-04-13 MED ORDER — DIPHENHYDRAMINE HCL 25 MG PO CAPS
25.0000 mg | ORAL_CAPSULE | Freq: Once | ORAL | Status: AC
  Administered 2022-04-13: 25 mg via ORAL
  Filled 2022-04-13: qty 1

## 2022-04-13 MED ORDER — ACETAMINOPHEN 325 MG PO TABS: 650.0000 mg | ORAL_TABLET | Freq: Once | ORAL | Status: DC

## 2022-04-13 NOTE — Patient Instructions (Signed)

## 2022-04-14 LAB — TYPE AND SCREEN
ABO/RH(D): A NEG
Antibody Screen: NEGATIVE
Unit division: 0

## 2022-04-14 LAB — BPAM RBC
Blood Product Expiration Date: 202311022359
ISSUE DATE / TIME: 202310191117
Unit Type and Rh: 600

## 2022-04-18 ENCOUNTER — Other Ambulatory Visit: Payer: PPO

## 2022-04-20 MED FILL — Dexamethasone Sodium Phosphate Inj 100 MG/10ML: INTRAMUSCULAR | Qty: 1 | Status: AC

## 2022-04-21 ENCOUNTER — Ambulatory Visit: Payer: PPO

## 2022-04-21 ENCOUNTER — Other Ambulatory Visit: Payer: Self-pay

## 2022-04-21 ENCOUNTER — Inpatient Hospital Stay: Payer: PPO

## 2022-04-21 ENCOUNTER — Encounter: Payer: Self-pay | Admitting: Hematology

## 2022-04-21 ENCOUNTER — Inpatient Hospital Stay (HOSPITAL_BASED_OUTPATIENT_CLINIC_OR_DEPARTMENT_OTHER): Payer: PPO | Admitting: Hematology

## 2022-04-21 ENCOUNTER — Ambulatory Visit: Payer: PPO | Admitting: Hematology

## 2022-04-21 VITALS — BP 129/79 | HR 74 | Temp 98.6°F | Resp 16 | Ht 70.0 in | Wt 164.5 lb

## 2022-04-21 DIAGNOSIS — C61 Malignant neoplasm of prostate: Secondary | ICD-10-CM

## 2022-04-21 DIAGNOSIS — Z5111 Encounter for antineoplastic chemotherapy: Secondary | ICD-10-CM | POA: Diagnosis not present

## 2022-04-21 DIAGNOSIS — C7951 Secondary malignant neoplasm of bone: Secondary | ICD-10-CM | POA: Diagnosis not present

## 2022-04-21 DIAGNOSIS — D638 Anemia in other chronic diseases classified elsewhere: Secondary | ICD-10-CM

## 2022-04-21 LAB — CMP (CANCER CENTER ONLY)
ALT: 14 U/L (ref 0–44)
AST: 26 U/L (ref 15–41)
Albumin: 4.3 g/dL (ref 3.5–5.0)
Alkaline Phosphatase: 310 U/L — ABNORMAL HIGH (ref 38–126)
Anion gap: 10 (ref 5–15)
BUN: 15 mg/dL (ref 8–23)
CO2: 23 mmol/L (ref 22–32)
Calcium: 8.8 mg/dL — ABNORMAL LOW (ref 8.9–10.3)
Chloride: 105 mmol/L (ref 98–111)
Creatinine: 0.6 mg/dL — ABNORMAL LOW (ref 0.61–1.24)
GFR, Estimated: >60 mL/min (ref >60)
Glucose, Bld: 144 mg/dL — ABNORMAL HIGH (ref 70–99)
Potassium: 4.2 mmol/L (ref 3.5–5.1)
Sodium: 138 mmol/L (ref 135–145)
Total Bilirubin: 0.6 mg/dL (ref 0.3–1.2)
Total Protein: 6.5 g/dL (ref 6.5–8.1)

## 2022-04-21 LAB — CBC WITH DIFFERENTIAL (CANCER CENTER ONLY)
Abs Immature Granulocytes: 0.74 10*3/uL — ABNORMAL HIGH (ref 0.00–0.07)
Basophils Absolute: 0.1 10*3/uL (ref 0.0–0.1)
Basophils Relative: 1 %
Eosinophils Absolute: 0 10*3/uL (ref 0.0–0.5)
Eosinophils Relative: 0 %
HCT: 24.5 % — ABNORMAL LOW (ref 39.0–52.0)
Hemoglobin: 8.1 g/dL — ABNORMAL LOW (ref 13.0–17.0)
Immature Granulocytes: 9 %
Lymphocytes Relative: 10 %
Lymphs Abs: 0.8 10*3/uL (ref 0.7–4.0)
MCH: 30.1 pg (ref 26.0–34.0)
MCHC: 33.1 g/dL (ref 30.0–36.0)
MCV: 91.1 fL (ref 80.0–100.0)
Monocytes Absolute: 0.7 10*3/uL (ref 0.1–1.0)
Monocytes Relative: 9 %
Neutro Abs: 5.7 10*3/uL (ref 1.7–7.7)
Neutrophils Relative %: 71 %
Platelet Count: 71 10*3/uL — ABNORMAL LOW (ref 150–400)
RBC: 2.69 MIL/uL — ABNORMAL LOW (ref 4.22–5.81)
RDW: 18.7 % — ABNORMAL HIGH (ref 11.5–15.5)
WBC Count: 8.1 10*3/uL (ref 4.0–10.5)
nRBC: 4.3 % — ABNORMAL HIGH (ref 0.0–0.2)

## 2022-04-21 LAB — FERRITIN: Ferritin: 667 ng/mL — ABNORMAL HIGH (ref 24–336)

## 2022-04-21 MED ORDER — SODIUM CHLORIDE 0.9 % IV SOLN
75.0000 mg/m2 | Freq: Once | INTRAVENOUS | Status: AC
  Administered 2022-04-21: 150 mg via INTRAVENOUS
  Filled 2022-04-21: qty 15

## 2022-04-21 MED ORDER — SODIUM CHLORIDE 0.9 % IV SOLN: Freq: Once | INTRAVENOUS | Status: AC

## 2022-04-21 MED ORDER — SODIUM CHLORIDE 0.9 % IV SOLN
10.0000 mg | Freq: Once | INTRAVENOUS | Status: AC
  Administered 2022-04-21: 10 mg via INTRAVENOUS
  Filled 2022-04-21: qty 10

## 2022-04-21 NOTE — Progress Notes (Signed)
Proceed w/ full dose Docetaxel per Dr. Burr Medico. OK to proceed w/ Zoledronic Acid today.  Pt will be advised to double his Ca + vit D suppl.  Kennith Center, Pharm.D., CPP 04/21/2022'@9'$ :38 AM

## 2022-04-21 NOTE — Progress Notes (Signed)
Zometa will be held today.  Pt had recent root canal. Will resume in 3 weeks per Dr. Burr Medico.  Kennith Center, Pharm.D., CPP 04/21/2022'@9'$ :48 AM

## 2022-04-21 NOTE — Progress Notes (Signed)
[place]   Telephone:(336) [phone] [phone]) [phone]   Clinic Follow up Note   Patient Care Team: [name], MD as PCP - General [name] [name], MD as PCP - Infectious Diseases (Infectious Diseases) Katheren Puller, RN as Oncology Nurse Navigator  Date of Service:  04/21/2022  CHIEF COMPLAINT: f/u of metastatic prostate cancer  CURRENT THERAPY:  -Docetaxel, q21d, starting 03/31/22 -Lupron, started 07/06/21, now q48month -Zometa, q370month starting 07/06/21  ASSESSMENT & PLAN:  Patrick [name] is a [age].o. male with   1. 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 PSA showed extreme elevation of >3,000. CT CAP showed widespread osseous metastatic disease with multiple fractures. Biopsy of soft tissue metastasis at L5 confirmed metastatic prostate cancer.  -he started casodex on 1/10, and Lupron and Zometa on 07/06/21 in the hospital, and Zytiga '1000mg'$  with prednisone '10mg'$  in mid 07/2021, tolerating very well. -his PSA initially responded well, coming down to 0.5 on 10/26/21. Unfortunately, his PSA rose to 19.3 on 01/19/22 and 99.6 on 02/13/22. -restaging bone scan 02/22/22 showed probable osseous metastases. Further evaluation with PSMA PET scan on 03/23/22 confirmed widespread intensely radiotracer-avid skeletal metastasis involving axillary and appendicular skeleton; no abnormal activity in prostate gland or lymph nodes. -his Zytiga was switched to chemo doxetaxel on 03/31/22, plan for up to 10 cycles. He tolerated first cycle very well with a few days of nausea. -labs reviewed, overall stable to improved (see #2). His PSA did show a flare to 1,301 after starting chemotherapy; we will monitor with subsequent cycles to ensure it drops again.   2. Moderate anemia and thrombocytopenia  -present at initial presentation in 06/2021, which was new from 08/2020. -his  counts dropped on 03/27/22-- RBC 2.07, hgb 6.5, plt 51k. He received 2u pRBC on 10/4 and 1u on 10/19. -CBC today (04/21/22) shows some improvement-- RBC 2.69, hgb 8.1, plt 71k.   3. Bone metastasis and pain  -he received palliative RT to T10-SI joints under Dr. MaTammi Klippel/26/23 - 08/03/21 -Zometa started on 07/06/21, will continue every 3 months.  -s/p kyphoplasty on 10/18/21. Path showed necrosis, malignancy was not excluded. -continue gabapentin and celebrex, in addition to calcium and vit D.     PLAN: -proceed with C2 docetaxel at full dose '75mg'$ /m2, continue prednisone 5 mg twice daily on the days he is not on dexamethasone -Due to his recent root canal dental procedure, will postpone Zometa to next time in 3 weeks  -lab 11/3 with blood transfusion 11/4 if needed  -lab, f/u, and docetaxel in 3 and 6 weeks   No problem-specific Assessment & Plan notes found for this encounter.   SUMMARY OF ONCOLOGIC HISTORY: Oncology History Overview Note   Cancer Staging  [facility]: Prostate, AJCC 8th Edition - Clinical stage from 07/01/2021: Stage IVB (cTX, cNX, pM1b) - Signed by FeTruitt MerleMD on 07/05/2021    [facility] 06/30/[facility] Tumor Marker   Patient's tumor was tested for the following markers: PSA. Results of the tumor marker test revealed >3,000.   07/01/2021 Cancer Staging   Staging form: Prostate, AJCC 8th Edition - Clinical stage from 07/01/2021: Stage IVB (cTX, cNX, pM1b) - Signed by FeTruitt MerleMD on 07/05/2021   07/01/2021 Imaging   EXAM: CT CHEST, ABDOMEN, AND PELVIS WITH CONTRAST  IMPRESSION: Widespread osseous metastatic disease of unknown primary. Numerous  pathologic fractures involving the ribs bilaterally as well as the T11, T12, L2, L3, and L4 vertebral bodies. Retropulsion of T11 and L3 results in severe central canal stenosis at L3. Multifactorial severe central canal stenosis also noted at L4, in part related  to metastatic disease. Prominent soft tissue component involving the metastasis involving the spinous process of L5 may provide an appropriate target for tissue sampling for further evaluation.   Mild coronary artery calcification.   Thoracic aortic aneurysm with maximal transaxial dimension of 4.9 cm. Ascending thoracic aortic aneurysm. Recommend semi-annual imaging followup by CTA or MRA and referral to cardiothoracic surgery if not already obtained. This recommendation follows 2010 ACCF/AHA/AATS/ACR/ASA/SCA/SCAI/SIR/STS/SVM Guidelines for the Diagnosis and Management of Patients With Thoracic Aortic Disease. Circulation. 2010; 121: O350-K938. Aortic aneurysm NOS (ICD10-I71.9)   Moderate enlargement of the prostate gland. Correlation with serum PSA may be helpful.   07/01/2021 Imaging   EXAM: MRI HEAD WITHOUT CONTRAST  IMPRESSION: 1. No evidence of intracranial metastases on this noncontrast study. 2. Diffusely abnormal bone marrow signal consistent with known widespread osseous metastases.   07/01/2021 Pathology Results   FINAL MICROSCOPIC DIAGNOSIS:   A. SOFT TISSUE MASS, L5, NEEDLE CORE BIOPSY:  -  Metastatic carcinoma  -  See comment   COMMENT:  By immunohistochemistry, the neoplastic cells are positive for PSA and prostein but negative for cytokeratin 7, cytokeratin 20, CDX2 and TTF-1. Overall, the immunoprofile is consistent with a prostatic primary.   07/02/2021 Initial Diagnosis   Prostate cancer metastatic to bone ([place])   07/21/2021 - 08/03/2021 Radiation Therapy   The painful site of metastasis at L5 was treated to 30 Gy in 10 fractions of 3 Gy each.   08/03/2021 Tumor Marker   Patient's tumor was tested for the following markers: PSA. Results of the tumor marker test revealed 76.8.   03/31/2022 -  Chemotherapy   Patient is on Treatment Plan : PROSTATE Docetaxel (75) + Prednisone q21d        INTERVAL HISTORY:  Patrick [name] is here for a follow up of metastatic  prostate cancer. He was last seen by NP Mendel Ryder on 04/07/22. He presents to the clinic alone. He reports he did well with first cycle of chemo. He reports he had nausea for a day or two, but this resolved with antiemetics.   All other systems were reviewed with the patient and are negative.  MEDICAL HISTORY:  Past Medical History:  Diagnosis Date   Allergy    seasonal   Arthritis    r knee   H/O inguinal hernia repair    History of tobacco use    HIV infection ([place])    Hyperlipidemia    Hypertension    Strabismic amblyopia     SURGICAL HISTORY: Past Surgical History:  Procedure Laterality Date   EYE SURGERY     strabismus   HERNIA REPAIR     IR BONE TUMOR(S)RF ABLATION  10/18/2021   IR BONE TUMOR(S)RF ABLATION  10/18/2021   IR KYPHO EA ADDL LEVEL THORACIC OR LUMBAR  10/18/2021   IR KYPHO LUMBAR INC FX REDUCE BONE BX UNI/BIL CANNULATION INC/IMAGING  10/18/2021    I have reviewed the social history and family history with the patient and they are unchanged from previous note.  ALLERGIES:  has No Known Allergies.  MEDICATIONS:  Current Outpatient Medications  Medication Sig Dispense Refill   albuterol (VENTOLIN HFA) 108 (90 Base) MCG/ACT inhaler Inhale 2 puffs into the lungs every 6 (six) hours  as needed for shortness of breath or wheezing.     amLODipine (NORVASC) 5 MG tablet Take 1 tablet (5 mg total) by mouth daily. 30 tablet 11   atorvastatin (LIPITOR) 40 MG tablet TAKE 1 TABLET(40 MG) BY MOUTH DAILY 90 tablet 2   calcium citrate-vitamin D (CITRACAL+D) 315-200 MG-UNIT tablet Take 1 tablet by mouth daily.     celecoxib (CELEBREX) 200 MG capsule Take 1 capsule (200 mg total) by mouth 2 (two) times daily. 30 capsule 1   dexamethasone (DECADRON) 4 MG tablet Take 1 tablet by mouth 2 times daily starting day before chemo. Then take 1 tablet daily for 2 days starting day after chemo. Take with food. 30 tablet 1   emtricitabine-rilpivir-tenofovir AF (ODEFSEY) 200-25-25 MG TABS  tablet Take 1 tablet by mouth daily. 90 tablet 3   gabapentin (NEURONTIN) 100 MG capsule Take 2-3 capsules (200-300 mg total) by mouth 3 (three) times daily. 180 capsule 1   lidocaine (LIDODERM) 5 % Place 1 patch onto the skin daily. Remove & Discard patch within 12 hours or as directed by MD 30 patch 0   lidocaine-prilocaine (EMLA) cream Apply to affected area once 30 g 3   Multiple Vitamins-Minerals (MULTIVITAMIN WITH MINERALS) tablet Take 1 tablet by mouth daily.     ondansetron (ZOFRAN) 8 MG tablet Take 1 tablet (8 mg total) by mouth every 8 (eight) hours as needed for nausea or vomiting. 30 tablet 1   predniSONE (DELTASONE) 5 MG tablet Take 1 tablet (5 mg total) by mouth in the morning and at bedtime. 60 tablet 4   prochlorperazine (COMPAZINE) 10 MG tablet Take 1 tablet (10 mg total) by mouth every 6 (six) hours as needed for nausea or vomiting. 30 tablet 1   No current facility-administered medications for this visit.   Facility-Administered Medications Ordered in Other Visits  Medication Dose Route Frequency Provider Last Rate Last Admin   DOCEtaxel (TAXOTERE) 150 mg in sodium chloride 0.9 % 250 mL chemo infusion  75 mg/m2 (Treatment Plan Recorded) Intravenous Once Truitt Merle, MD 265 mL/hr at 04/21/22 1016 150 mg at 04/21/22 1016    PHYSICAL EXAMINATION: ECOG PERFORMANCE STATUS: 2 - Symptomatic, <50% confined to bed  Vitals:   04/21/22 0841  BP: 129/79  Pulse: 74  Resp: 16  Temp: 98.6 F (37 C)  SpO2: 100%   Wt Readings from Last 3 Encounters:  04/21/22 164 lb 8 oz (74.6 kg)  04/07/22 162 lb 9.6 oz (73.8 kg)  03/31/22 161 lb 12.8 oz (73.4 kg)     GENERAL:alert, no distress and comfortable SKIN: skin color normal, no rashes or significant lesions EYES: normal, Conjunctiva are pink and non-injected, sclera clear  NEURO: alert & oriented x 3 with fluent speech  LABORATORY DATA:  I have reviewed the data as listed    Latest Ref Rng & Units 04/21/2022    8:23 AM 04/12/2022    11:26 AM 04/07/2022   12:57 PM  CBC  WBC 4.0 - 10.5 K/uL 8.1  12.5  8.5   Hemoglobin 13.0 - 17.0 g/dL 8.1  7.4  7.6   Hematocrit 39.0 - 52.0 % 24.5  22.4  22.7   Platelets 150 - 400 K/uL 71  53  60         Latest Ref Rng & Units 04/21/2022    8:23 AM 04/12/2022   11:26 AM 04/07/2022   12:57 PM  CMP  Glucose 70 - 99 mg/dL 144  104  126  BUN 8 - 23 mg/dL '15  15  19   '$ Creatinine 0.61 - 1.24 mg/dL 0.60  0.69  0.75   Sodium 135 - 145 mmol/L 138  138  136   Potassium 3.5 - 5.1 mmol/L 4.2  4.0  4.8   Chloride 98 - 111 mmol/L 105  107  103   CO2 22 - 32 mmol/L '23  24  26   '$ Calcium 8.9 - 10.3 mg/dL 8.8  8.9  9.0   Total Protein 6.5 - 8.1 g/dL 6.5  6.3  6.1   Total Bilirubin 0.3 - 1.2 mg/dL 0.6  0.7  0.8   Alkaline Phos 38 - 126 U/L 310  198  136   AST 15 - 41 U/L '26  29  25   '$ ALT 0 - 44 U/L '14  12  15       '$ RADIOGRAPHIC STUDIES: I have personally reviewed the radiological images as listed and agreed with the findings in the report. No results found.    Orders Placed This Encounter  Procedures   CBC with Differential ([place] Only)    Standing Status:   Future    Standing Expiration Date:   05/14/2023   CMP ([place] only)    Standing Status:   Future    Standing Expiration Date:   05/14/2023   CBC with Differential ([place] Only)    Standing Status:   Future    Standing Expiration Date:   06/04/2023   CMP ([place] only)    Standing Status:   Future    Standing Expiration Date:   06/04/2023   All questions were answered. The patient knows to call the clinic with any problems, questions or concerns. No barriers to learning was detected. The total time spent in the appointment was 30 minutes.     Truitt Merle, MD 04/21/2022   I, Wilburn Mylar, am acting as scribe for Truitt Merle, MD.   I have reviewed the above documentation for accuracy and completeness, and I agree with the above.

## 2022-04-21 NOTE — Patient Instructions (Signed)
[facility] MEDICAL ONCOLOGY  Discharge Instructions: Thank you for choosing [facility] to provide your oncology and hematology care.   If you have a lab appointment with the Cancer Center, please go directly to the Cancer Center and check in at the registration area.   Wear comfortable clothing and clothing appropriate for easy access to any Portacath or PICC line.   We strive to give you quality time with your provider. You may need to reschedule your appointment if you arrive late (15 or more minutes).  Arriving late affects you and other patients whose appointments are after yours.  Also, if you miss three or more appointments without notifying the office, you may be dismissed from the clinic at the provider's discretion.      For prescription refill requests, have your pharmacy contact our office and allow 72 hours for refills to be completed.    Today you received the following chemotherapy and/or immunotherapy agents: Docetaxel      To help prevent nausea and vomiting after your treatment, we encourage you to take your nausea medication as directed.  BELOW ARE SYMPTOMS THAT SHOULD BE REPORTED IMMEDIATELY: *FEVER GREATER THAN 100.4 F (38 C) OR HIGHER *CHILLS OR SWEATING *NAUSEA AND VOMITING THAT IS NOT CONTROLLED WITH YOUR NAUSEA MEDICATION *UNUSUAL SHORTNESS OF BREATH *UNUSUAL BRUISING OR BLEEDING *URINARY PROBLEMS (pain or burning when urinating, or frequent urination) *BOWEL PROBLEMS (unusual diarrhea, constipation, pain near the anus) TENDERNESS IN MOUTH AND THROAT WITH OR WITHOUT PRESENCE OF ULCERS (sore throat, sores in mouth, or a toothache) UNUSUAL RASH, SWELLING OR PAIN  UNUSUAL VAGINAL DISCHARGE OR ITCHING   Items with * indicate a potential emergency and should be followed up as soon as possible or go to the Emergency Department if any problems should occur.  Please show the CHEMOTHERAPY ALERT CARD or IMMUNOTHERAPY ALERT CARD at check-in to  the Emergency Department and triage nurse.  Should you have questions after your visit or need to cancel or reschedule your appointment, please contact [facility]COL[facility]  Dept: [phone]  and follow the prompts.  Office hours are 8:00 a.m. to 4:30 p.m. Monday - Friday. Please note that voicemails left after 4:00 p.m. may not be returned until the following business day.  We are closed weekends and major holidays. You have access to a nurse at all times for urgent questions. Please call the main number to the clinic Dept: [phone] and follow the prompts.   For any non-urgent questions, you may also contact your provider using MyChart. We now offer e-Visits for anyone 18 and older to request care online for non-urgent symptoms. For details visit [email or web address].   Also download the MyChart app! Go to the app store, search "MyChart", open the app, select [facility], and log in with your MyChart username and password.  Masks are optional in the cancer centers. If you would like for your care team to wear a mask while they are taking care of you, please let them know. You may have one support person who is at least 72 years old accompany you for your appointments. 

## 2022-04-21 NOTE — Progress Notes (Signed)
Per Dr Burr Medico, holding zometa today d/t recent root canal. Will proceed in 3 wks. Pt is aware and agrees to increase Ca + vit D suppl.

## 2022-04-24 ENCOUNTER — Inpatient Hospital Stay: Payer: PPO

## 2022-04-24 ENCOUNTER — Other Ambulatory Visit: Payer: Self-pay

## 2022-04-24 VITALS — BP 131/81 | HR 86 | Temp 98.9°F | Resp 17

## 2022-04-24 DIAGNOSIS — C61 Malignant neoplasm of prostate: Secondary | ICD-10-CM

## 2022-04-24 DIAGNOSIS — Z5111 Encounter for antineoplastic chemotherapy: Secondary | ICD-10-CM | POA: Diagnosis not present

## 2022-04-24 MED ORDER — PEGFILGRASTIM-CBQV 6 MG/0.6ML ~~LOC~~ SOSY
6.0000 mg | PREFILLED_SYRINGE | Freq: Once | SUBCUTANEOUS | Status: AC
  Administered 2022-04-24: 6 mg via SUBCUTANEOUS
  Filled 2022-04-24: qty 0.6

## 2022-04-24 NOTE — Patient Instructions (Signed)

## 2022-04-25 ENCOUNTER — Telehealth: Payer: Self-pay | Admitting: Hematology

## 2022-04-25 ENCOUNTER — Other Ambulatory Visit: Payer: Self-pay

## 2022-04-25 NOTE — Telephone Encounter (Signed)
Left patient a voicemail regarding upcoming appointments  

## 2022-04-26 ENCOUNTER — Other Ambulatory Visit: Payer: Self-pay

## 2022-04-26 LAB — TESTOSTERONE, FREE, TOTAL, SHBG
Sex Hormone Binding: 59 nmol/L (ref 19.3–76.4)
Testosterone, Free: <0.2 pg/mL — ABNORMAL LOW (ref 6.6–18.1)
Testosterone: <3 ng/dL — ABNORMAL LOW (ref 264–916)

## 2022-04-27 ENCOUNTER — Other Ambulatory Visit: Payer: Self-pay

## 2022-04-27 ENCOUNTER — Inpatient Hospital Stay: Payer: PPO | Attending: Hematology

## 2022-04-27 DIAGNOSIS — D649 Anemia, unspecified: Secondary | ICD-10-CM | POA: Insufficient documentation

## 2022-04-27 DIAGNOSIS — Z5111 Encounter for antineoplastic chemotherapy: Secondary | ICD-10-CM | POA: Insufficient documentation

## 2022-04-27 DIAGNOSIS — Z5189 Encounter for other specified aftercare: Secondary | ICD-10-CM | POA: Insufficient documentation

## 2022-04-27 DIAGNOSIS — C7951 Secondary malignant neoplasm of bone: Secondary | ICD-10-CM | POA: Insufficient documentation

## 2022-04-27 DIAGNOSIS — C61 Malignant neoplasm of prostate: Secondary | ICD-10-CM | POA: Insufficient documentation

## 2022-04-27 LAB — CBC WITH DIFFERENTIAL/PLATELET
Abs Immature Granulocytes: 0.27 10*3/uL — ABNORMAL HIGH (ref 0.00–0.07)
Basophils Absolute: 0 10*3/uL (ref 0.0–0.1)
Basophils Relative: 1 %
Eosinophils Absolute: 0 10*3/uL (ref 0.0–0.5)
Eosinophils Relative: 0 %
HCT: 23.8 % — ABNORMAL LOW (ref 39.0–52.0)
Hemoglobin: 7.8 g/dL — ABNORMAL LOW (ref 13.0–17.0)
Immature Granulocytes: 7 %
Lymphocytes Relative: 16 %
Lymphs Abs: 0.6 10*3/uL — ABNORMAL LOW (ref 0.7–4.0)
MCH: 30 pg (ref 26.0–34.0)
MCHC: 32.8 g/dL (ref 30.0–36.0)
MCV: 91.5 fL (ref 80.0–100.0)
Monocytes Absolute: 0.5 10*3/uL (ref 0.1–1.0)
Monocytes Relative: 13 %
Neutro Abs: 2.3 10*3/uL (ref 1.7–7.7)
Neutrophils Relative %: 63 %
Platelets: 98 10*3/uL — ABNORMAL LOW (ref 150–400)
RBC: 2.6 MIL/uL — ABNORMAL LOW (ref 4.22–5.81)
RDW: 18.6 % — ABNORMAL HIGH (ref 11.5–15.5)
WBC: 3.7 10*3/uL — ABNORMAL LOW (ref 4.0–10.5)
nRBC: 3.3 % — ABNORMAL HIGH (ref 0.0–0.2)

## 2022-04-27 LAB — COMPREHENSIVE METABOLIC PANEL
ALT: 12 U/L (ref 0–44)
AST: 19 U/L (ref 15–41)
Albumin: 4.1 g/dL (ref 3.5–5.0)
Alkaline Phosphatase: 266 U/L — ABNORMAL HIGH (ref 38–126)
Anion gap: 6 (ref 5–15)
BUN: 17 mg/dL (ref 8–23)
CO2: 25 mmol/L (ref 22–32)
Calcium: 8.6 mg/dL — ABNORMAL LOW (ref 8.9–10.3)
Chloride: 106 mmol/L (ref 98–111)
Creatinine, Ser: 0.64 mg/dL (ref 0.61–1.24)
GFR, Estimated: >60 mL/min (ref >60)
Glucose, Bld: 106 mg/dL — ABNORMAL HIGH (ref 70–99)
Potassium: 5.1 mmol/L (ref 3.5–5.1)
Sodium: 137 mmol/L (ref 135–145)
Total Bilirubin: 0.7 mg/dL (ref 0.3–1.2)
Total Protein: 6.3 g/dL — ABNORMAL LOW (ref 6.5–8.1)

## 2022-04-28 ENCOUNTER — Other Ambulatory Visit: Payer: Self-pay | Admitting: Hematology

## 2022-04-28 ENCOUNTER — Inpatient Hospital Stay: Payer: PPO

## 2022-04-28 ENCOUNTER — Other Ambulatory Visit (HOSPITAL_COMMUNITY): Payer: Self-pay

## 2022-04-28 DIAGNOSIS — D649 Anemia, unspecified: Secondary | ICD-10-CM

## 2022-04-28 NOTE — Progress Notes (Signed)
Per Dr Burr Medico, no blood needed today. Pt states he is feeling well with no SOB, fatigue, or dizziness. Wheelchair to lobby for discharge. Pt is aware and agrees to call if he begins not feeling well.

## 2022-04-30 ENCOUNTER — Other Ambulatory Visit: Payer: Self-pay | Admitting: Hematology

## 2022-04-30 DIAGNOSIS — C7951 Secondary malignant neoplasm of bone: Secondary | ICD-10-CM

## 2022-05-01 ENCOUNTER — Encounter: Payer: Self-pay | Admitting: Genetic Counselor

## 2022-05-01 DIAGNOSIS — Z1379 Encounter for other screening for genetic and chromosomal anomalies: Secondary | ICD-10-CM | POA: Insufficient documentation

## 2022-05-02 ENCOUNTER — Other Ambulatory Visit: Payer: Self-pay

## 2022-05-03 ENCOUNTER — Encounter: Payer: Self-pay | Admitting: Hematology

## 2022-05-04 DIAGNOSIS — M6258 Muscle wasting and atrophy, not elsewhere classified, other site: Secondary | ICD-10-CM | POA: Diagnosis not present

## 2022-05-04 DIAGNOSIS — M6281 Muscle weakness (generalized): Secondary | ICD-10-CM | POA: Diagnosis not present

## 2022-05-08 ENCOUNTER — Other Ambulatory Visit: Payer: Self-pay | Admitting: Hematology

## 2022-05-08 DIAGNOSIS — C61 Malignant neoplasm of prostate: Secondary | ICD-10-CM

## 2022-05-10 ENCOUNTER — Other Ambulatory Visit (HOSPITAL_COMMUNITY): Payer: Self-pay

## 2022-05-10 MED ORDER — GABAPENTIN 100 MG PO CAPS
200.0000 mg | ORAL_CAPSULE | Freq: Three times a day (TID) | ORAL | 1 refills | Status: DC
  Filled 2022-05-10: qty 180, 20d supply, fill #0
  Filled 2022-06-07: qty 180, 20d supply, fill #1

## 2022-05-11 MED FILL — Dexamethasone Sodium Phosphate Inj 100 MG/10ML: INTRAMUSCULAR | Qty: 1 | Status: AC

## 2022-05-12 ENCOUNTER — Encounter: Payer: Self-pay | Admitting: Adult Health

## 2022-05-12 ENCOUNTER — Inpatient Hospital Stay (HOSPITAL_BASED_OUTPATIENT_CLINIC_OR_DEPARTMENT_OTHER): Payer: PPO | Admitting: Adult Health

## 2022-05-12 ENCOUNTER — Inpatient Hospital Stay: Payer: PPO

## 2022-05-12 VITALS — BP 120/64 | HR 74 | Temp 97.5°F | Resp 18 | Wt 166.6 lb

## 2022-05-12 DIAGNOSIS — C7951 Secondary malignant neoplasm of bone: Secondary | ICD-10-CM

## 2022-05-12 DIAGNOSIS — C61 Malignant neoplasm of prostate: Secondary | ICD-10-CM

## 2022-05-12 DIAGNOSIS — D649 Anemia, unspecified: Secondary | ICD-10-CM

## 2022-05-12 DIAGNOSIS — Z5111 Encounter for antineoplastic chemotherapy: Secondary | ICD-10-CM | POA: Diagnosis not present

## 2022-05-12 LAB — CMP (CANCER CENTER ONLY)
ALT: 11 U/L (ref 0–44)
AST: 18 U/L (ref 15–41)
Albumin: 4.3 g/dL (ref 3.5–5.0)
Alkaline Phosphatase: 233 U/L — ABNORMAL HIGH (ref 38–126)
Anion gap: 6 (ref 5–15)
BUN: 21 mg/dL (ref 8–23)
CO2: 25 mmol/L (ref 22–32)
Calcium: 9.1 mg/dL (ref 8.9–10.3)
Chloride: 104 mmol/L (ref 98–111)
Creatinine: 0.59 mg/dL — ABNORMAL LOW (ref 0.61–1.24)
GFR, Estimated: >60 mL/min (ref >60)
Glucose, Bld: 118 mg/dL — ABNORMAL HIGH (ref 70–99)
Potassium: 4.7 mmol/L (ref 3.5–5.1)
Sodium: 135 mmol/L (ref 135–145)
Total Bilirubin: 0.5 mg/dL (ref 0.3–1.2)
Total Protein: 6.7 g/dL (ref 6.5–8.1)

## 2022-05-12 LAB — CBC WITH DIFFERENTIAL (CANCER CENTER ONLY)
Abs Immature Granulocytes: 0.34 10*3/uL — ABNORMAL HIGH (ref 0.00–0.07)
Basophils Absolute: 0 10*3/uL (ref 0.0–0.1)
Basophils Relative: 1 %
Eosinophils Absolute: 0 10*3/uL (ref 0.0–0.5)
Eosinophils Relative: 0 %
HCT: 22 % — ABNORMAL LOW (ref 39.0–52.0)
Hemoglobin: 7.1 g/dL — ABNORMAL LOW (ref 13.0–17.0)
Immature Granulocytes: 5 %
Lymphocytes Relative: 11 %
Lymphs Abs: 0.7 10*3/uL (ref 0.7–4.0)
MCH: 30.3 pg (ref 26.0–34.0)
MCHC: 32.3 g/dL (ref 30.0–36.0)
MCV: 94 fL (ref 80.0–100.0)
Monocytes Absolute: 0.8 10*3/uL (ref 0.1–1.0)
Monocytes Relative: 12 %
Neutro Abs: 4.7 10*3/uL (ref 1.7–7.7)
Neutrophils Relative %: 71 %
Platelet Count: 115 10*3/uL — ABNORMAL LOW (ref 150–400)
RBC: 2.34 MIL/uL — ABNORMAL LOW (ref 4.22–5.81)
RDW: 21.2 % — ABNORMAL HIGH (ref 11.5–15.5)
WBC Count: 6.5 10*3/uL (ref 4.0–10.5)
nRBC: 2.6 % — ABNORMAL HIGH (ref 0.0–0.2)

## 2022-05-12 LAB — PREPARE RBC (CROSSMATCH)

## 2022-05-12 LAB — SAMPLE TO BLOOD BANK

## 2022-05-12 MED ORDER — SODIUM CHLORIDE 0.9 % IV SOLN
75.0000 mg/m2 | Freq: Once | INTRAVENOUS | Status: AC
  Administered 2022-05-12: 150 mg via INTRAVENOUS
  Filled 2022-05-12: qty 15

## 2022-05-12 MED ORDER — SODIUM CHLORIDE 0.9 % IV SOLN: Freq: Once | INTRAVENOUS | Status: AC

## 2022-05-12 MED ORDER — SODIUM CHLORIDE 0.9 % IV SOLN
10.0000 mg | Freq: Once | INTRAVENOUS | Status: AC
  Administered 2022-05-12: 10 mg via INTRAVENOUS
  Filled 2022-05-12: qty 10

## 2022-05-12 MED ORDER — ZOLEDRONIC ACID 4 MG/100ML IV SOLN
4.0000 mg | Freq: Once | INTRAVENOUS | Status: AC
  Administered 2022-05-12: 4 mg via INTRAVENOUS
  Filled 2022-05-12: qty 100

## 2022-05-12 NOTE — Progress Notes (Unsigned)
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[facility]) Staging form: Prostate, AJCC 8th Edition - Clinical stage from 07/01/2021: Stage IVB (cTX, cNX, pM1b) - Signed by Truitt Merle, MD on 07/05/2021   SUMMARY OF ONCOLOGIC HISTORY: Oncology History Overview Note   Cancer Staging  Prostate cancer metastatic to bone [facility]) Staging form: Prostate, AJCC 8th Edition - Clinical stage from 07/01/2021: Stage IVB (cTX, cNX, pM1b) - Signed by Truitt Merle, MD on 07/05/2021    Prostate cancer metastatic to bone [facility])  06/30/2021 Tumor Marker   Patient's tumor was tested for the following markers: PSA. Results of the tumor marker test revealed >3,000.   07/01/2021 Cancer Staging   Staging form: Prostate, AJCC 8th Edition - Clinical stage from 07/01/2021: Stage IVB (cTX, cNX, pM1b) - Signed by Truitt Merle, MD on 07/05/2021   07/01/2021 Imaging   EXAM: CT CHEST, ABDOMEN, AND PELVIS WITH CONTRAST  IMPRESSION: Widespread osseous metastatic disease of unknown primary. Numerous pathologic fractures involving the ribs bilaterally as well as the T11, T12, L2, L3, and L4 vertebral bodies. Retropulsion of T11 and L3 results in severe central canal stenosis at L3. Multifactorial severe central canal stenosis also noted at L4, in part related to metastatic disease. Prominent soft tissue component involving the metastasis involving the spinous process of L5 may provide an appropriate target for tissue sampling for further evaluation.   Mild coronary artery calcification.   Thoracic aortic aneurysm with maximal transaxial dimension of 4.9 cm. Ascending thoracic aortic aneurysm. Recommend semi-annual imaging followup by CTA or MRA and referral to cardiothoracic surgery if not already obtained. This recommendation follows 2010 ACCF/AHA/AATS/ACR/ASA/SCA/SCAI/SIR/STS/SVM Guidelines for the Diagnosis and  Management of Patients With Thoracic Aortic Disease. Circulation. 2010; 121: Y073-X106. Aortic aneurysm NOS (ICD10-I71.9)   Moderate enlargement of the prostate gland. Correlation with serum PSA may be helpful.   07/01/2021 Imaging   EXAM: MRI HEAD WITHOUT CONTRAST  IMPRESSION: 1. No evidence of intracranial metastases on this noncontrast study. 2. Diffusely abnormal bone marrow signal consistent with known widespread osseous metastases.   07/01/2021 Pathology Results   FINAL MICROSCOPIC DIAGNOSIS:   A. SOFT TISSUE MASS, L5, NEEDLE CORE BIOPSY:  -  Metastatic carcinoma  -  See comment   COMMENT:  By immunohistochemistry, the neoplastic cells are positive for PSA and prostein but negative for cytokeratin 7, cytokeratin 20, CDX2 and TTF-1. Overall, the immunoprofile is consistent with a prostatic primary.   07/02/2021 Initial Diagnosis   Prostate cancer metastatic to bone ([place])   07/21/2021 - 08/03/2021 Radiation Therapy   The painful site of metastasis at L5 was treated to 30 Gy in 10 fractions of 3 Gy each.   08/03/2021 Tumor Marker   Patient's tumor was tested for the following markers: PSA. Results of the tumor marker test revealed 76.8.   03/31/2022 -  Chemotherapy   Patient is on Treatment Plan : PROSTATE Docetaxel (74) + Prednisone q21d     04/30/2022 Genetic Testing   Negative genetic testing on the CancerNext-Expanded+RNAinsight panel.  The report date is April 30, 2022.  The CancerNext-Expanded gene panel offered by [facility] and includes sequencing and rearrangement analysis for the following 77 genes: AIP, ALK, APC*, ATM*, AXIN2, BAP1, BARD1, BLM, BMPR1A, BRCA1*, BRCA2*, BRIP1*, CDC73, CDH1*, CDK4, CDKN1B, CDKN2A, CHEK2*, CTNNA1, DICER1, FANCC, FH, FLCN, GALNT12, KIF1B, LZTR1, MAX, MEN1, MET, MLH1*, MSH2*, MSH3, MSH6*, MUTYH*,  NBN, NF1*, NF2, NTHL1, PALB2*, PHOX2B, PMS2*, POT1, PRKAR1A, PTCH1, PTEN*, RAD51C*, RAD51D*, RB1, RECQL, RET, SDHA, SDHAF2, SDHB, SDHC, SDHD, SMAD4,  SMARCA4, SMARCB1, SMARCE1, STK11, SUFU, TMEM127, TP53*, TSC1, TSC2, VHL and XRCC2 (sequencing and deletion/duplication); EGFR, EGLN1, HOXB13, KIT, MITF, PDGFRA, POLD1, and POLE (sequencing only); EPCAM and GREM1 (deletion/duplication only). DNA and RNA analyses performed for * genes.      CURRENT THERAPY: Docetaxel and Prednisone  INTERVAL HISTORY: Patrick [name] 71 y.o. male returns for follow-up of his metastatic prostate cancer.  He is receiving docetaxel given on day 1 of a 21-day cycle along with prednisone 5 mg twice daily on the days he is not taking dexamethasone.  He receives intermittent blood transfusions.  He also is receiving Zometa however this was recently held because of a dental procedure.   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  MEDICAL HISTORY: Past Medical History:  Diagnosis Date   Allergy    seasonal   Arthritis    r knee   H/O inguinal hernia repair    History of tobacco use    HIV infection ([name])    Hyperlipidemia    Hypertension    Strabismic amblyopia     SURGICAL HISTORY: Past Surgical History:  Procedure Laterality Date   EYE SURGERY     strabismus   HERNIA REPAIR     IR BONE TUMOR(S)RF ABLATION  10/18/2021   IR  BONE TUMOR(S)RF ABLATION  10/18/2021   IR KYPHO EA ADDL LEVEL THORACIC OR LUMBAR  10/18/2021   IR KYPHO LUMBAR INC FX REDUCE BONE BX UNI/BIL CANNULATION INC/IMAGING  10/18/2021    SOCIAL HISTORY: Social History   Socioeconomic History   Marital status: Single    Spouse name: Not on file   Number of children: Not on file   Years of education: Not on file   Highest education level: Not on file  Occupational History   Not on file  Tobacco Use   Smoking status: Former   Smokeless tobacco: Former    Types: Chew    Quit date: 1998  Vaping Use   Vaping Use: Never used  Substance and Sexual Activity   Alcohol use: Yes    Alcohol/week: 1.0 standard drink of alcohol    Types: 1 Cans of beer per week    Comment: occ   Drug use: No   Sexual activity: Never    Comment: pt. declined condoms  Other Topics Concern   Not on file  Social History Narrative   Not on file   Social Determinants of Health   Financial Resource Strain: Not on file  Food Insecurity: Not on file  Transportation Needs: Not on file  Physical Activity: Not on file  Stress: Not on file  Social Connections: Not on file  Intimate  Partner Violence: Not on file    FAMILY HISTORY: Family History  Problem Relation Age of Onset   Colon polyps Mother    Heart disease Mother    Heart disease Father    Kidney disease Father    Lung cancer Father    Diabetes Maternal Grandmother    Colon cancer Neg Hx     Review of Systems - Oncology    PHYSICAL EXAMINATION  ECOG PERFORMANCE STATUS: {CHL ONC ECOG WU:[id number]}  There were no vitals filed for this visit.  Physical Exam  LABORATORY DATA:  CBC    Component Value Date/Time   WBC 6.5 05/12/2022 1035   WBC 3.7 (L) 04/27/2022 1252   RBC 2.34 (L) 05/12/2022 1035   HGB 7.1 (L) 05/12/2022 1035   HGB 11.9 (L) 03/01/2022 1044   HCT 22.0 (L) 05/12/2022 1035   HCT 34.5 (L) 03/01/2022 1044   PLT 115 (L) 05/12/2022 1035   PLT 165 03/01/2022 1044   MCV 94.0  05/12/2022 1035   MCV 94 03/01/2022 1044   MCH 30.3 05/12/2022 1035   MCHC 32.3 05/12/2022 1035   RDW 21.2 (H) 05/12/2022 1035   RDW 13.1 03/01/2022 1044   LYMPHSABS 0.7 05/12/2022 1035   MONOABS 0.8 05/12/2022 1035   EOSABS 0.0 05/12/2022 1035   BASOSABS 0.0 05/12/2022 1035    CMP     Component Value Date/Time   NA 135 05/12/2022 1035   NA 141 03/01/2022 1044   K 4.7 05/12/2022 1035   CL 104 05/12/2022 1035   CO2 25 05/12/2022 1035   GLUCOSE 118 (H) 05/12/2022 1035   BUN 21 05/12/2022 1035   BUN 12 03/01/2022 1044   CREATININE 0.59 (L) 05/12/2022 1035   CREATININE 0.79 09/03/2020 0849   CALCIUM 9.1 05/12/2022 1035   PROT 6.7 05/12/2022 1035   PROT 6.5 03/01/2022 1044   ALBUMIN 4.3 05/12/2022 1035   ALBUMIN 4.4 03/01/2022 1044   AST 18 05/12/2022 1035   ALT 11 05/12/2022 1035   ALKPHOS 233 (H) 05/12/2022 1035   BILITOT 0.5 05/12/2022 1035   GFRNONAA >60 05/12/2022 1035   GFRNONAA >60 09/21/2010 1042   GFRAA 108 02/18/2020 1014   GFRAA >60 09/21/2010 1042       PENDING LABS:   RADIOGRAPHIC STUDIES:  No results found.   PATHOLOGY:     ASSESSMENT and THERAPY PLAN:   No problem-specific Assessment & Plan notes found for this encounter.   No orders of the defined types were placed in this encounter.   All questions were answered. The patient knows to call the clinic with any problems, questions or concerns. We can certainly see the patient much sooner if necessary. This note was electronically signed. Scot Dock, NP 05/12/2022

## 2022-05-12 NOTE — Progress Notes (Signed)
Per Mendel Ryder NP OK to proceed with tx today with Hgb 7.1, Pt is scheduled for blood transfusion tomorrow 05/13/2022.

## 2022-05-12 NOTE — Patient Instructions (Signed)
[facility]COL[facility]  Discharge Instructions: Thank you for choosing [facility] to provide your oncology and hematology care.   If you have a lab appointment with the Cancer Center, please go directly to the Cancer Center and check in at the registration area.   Wear comfortable clothing and clothing appropriate for easy access to any Portacath or PICC line.   We strive to give you quality time with your provider. You may need to reschedule your appointment if you arrive late (15 or more minutes).  Arriving late affects you and other patients whose appointments are after yours.  Also, if you miss three or more appointments without notifying the office, you may be dismissed from the clinic at the provider's discretion.      For prescription refill requests, have your pharmacy contact our office and allow 72 hours for refills to be completed.    Today you received the following chemotherapy and/or immunotherapy agents: Docetaxel      To help prevent nausea and vomiting after your treatment, we encourage you to take your nausea medication as directed.  BELOW ARE SYMPTOMS THAT SHOULD BE REPORTED IMMEDIATELY: *FEVER GREATER THAN 100.4 F (38 C) OR HIGHER *CHILLS OR SWEATING *NAUSEA AND VOMITING THAT IS NOT CONTROLLED WITH YOUR NAUSEA MEDICATION *UNUSUAL SHORTNESS OF BREATH *UNUSUAL BRUISING OR BLEEDING *URINARY PROBLEMS (pain or burning when urinating, or frequent urination) *BOWEL PROBLEMS (unusual diarrhea, constipation, pain near the anus) TENDERNESS IN MOUTH AND THROAT WITH OR WITHOUT PRESENCE OF ULCERS (sore throat, sores in mouth, or a toothache) UNUSUAL RASH, SWELLING OR PAIN  UNUSUAL VAGINAL DISCHARGE OR ITCHING   Items with * indicate a potential emergency and should be followed up as soon as possible or go to the Emergency Department if any problems should occur.  Please show the CHEMOTHERAPY ALERT CARD or IMMUNOTHERAPY ALERT CARD at check-in to  the Emergency Department and triage nurse.  Should you have questions after your visit or need to cancel or reschedule your appointment, please contact [facility]  Dept: [phone]  and follow the prompts.  Office hours are 8:00 a.m. to 4:30 p.m. Monday - Friday. Please note that voicemails left after 4:00 p.m. may not be returned until the following business day.  We are closed weekends and major holidays. You have access to a nurse at all times for urgent questions. Please call the main number to the clinic Dept: [phone] and follow the prompts.   For any non-urgent questions, you may also contact your provider using MyChart. We now offer e-Visits for anyone 18 and older to request care online for non-urgent symptoms. For details visit [email or web address].   Also download the MyChart app! Go to the app store, search "MyChart", open the app, select [facility], and log in with your MyChart username and password.  Masks are optional in the cancer centers. If you would like for your care team to wear a mask while they are taking care of you, please let them know. You may have one support person who is at least 72 years old accompany you for your appointments. 

## 2022-05-13 ENCOUNTER — Inpatient Hospital Stay: Payer: PPO

## 2022-05-13 DIAGNOSIS — C7951 Secondary malignant neoplasm of bone: Secondary | ICD-10-CM

## 2022-05-13 DIAGNOSIS — Z5111 Encounter for antineoplastic chemotherapy: Secondary | ICD-10-CM | POA: Diagnosis not present

## 2022-05-13 LAB — PROSTATE-SPECIFIC AG, SERUM (LABCORP): Prostate Specific Ag, Serum: 633 ng/mL — ABNORMAL HIGH (ref 0.0–4.0)

## 2022-05-13 MED ORDER — SODIUM CHLORIDE 0.9% IV SOLUTION
250.0000 mL | Freq: Once | INTRAVENOUS | Status: AC
  Administered 2022-05-13: 250 mL via INTRAVENOUS

## 2022-05-13 NOTE — Patient Instructions (Signed)

## 2022-05-14 ENCOUNTER — Encounter: Payer: Self-pay | Admitting: Hematology

## 2022-05-14 NOTE — Assessment & Plan Note (Signed)
Patrick [name] continues on Docetaxel every 3 weeks for his metastatic prostate cancer to the bone.  I reviewed his labs with him today which are stable and he will proceed with therapy.  We discussed that he will also receive Zometa today as he has healed from his root canal.    We are following Patrick [name]'s PSA which was drawn today.  He will return in 3 weeks to discuss these results with Dr. Burr Medico when he sees her prior to his next treatment.    Patrick [name] is anemic today  secondary to marrow involvement by his cancer and the taxotere.  He will proceed with treatment despite this.  He will receive 2 units of blood tomorrow and understands this.    Patrick [name] will return in 3 weeks for labs, f/u, and treatment.

## 2022-05-15 ENCOUNTER — Inpatient Hospital Stay: Payer: PPO

## 2022-05-15 VITALS — BP 131/80 | HR 75 | Temp 98.9°F | Resp 16

## 2022-05-15 DIAGNOSIS — C7951 Secondary malignant neoplasm of bone: Secondary | ICD-10-CM

## 2022-05-15 DIAGNOSIS — Z5111 Encounter for antineoplastic chemotherapy: Secondary | ICD-10-CM | POA: Diagnosis not present

## 2022-05-15 LAB — BPAM RBC
Blood Product Expiration Date: 202312012359
Blood Product Expiration Date: 202312012359
ISSUE DATE / TIME: 202311180826
ISSUE DATE / TIME: 202311180826
Unit Type and Rh: 600
Unit Type and Rh: 600

## 2022-05-15 LAB — TYPE AND SCREEN
ABO/RH(D): A NEG
Antibody Screen: NEGATIVE
Unit division: 0
Unit division: 0

## 2022-05-15 MED ORDER — PEGFILGRASTIM-CBQV 6 MG/0.6ML ~~LOC~~ SOSY
6.0000 mg | PREFILLED_SYRINGE | Freq: Once | SUBCUTANEOUS | Status: AC
  Administered 2022-05-15: 6 mg via SUBCUTANEOUS
  Filled 2022-05-15: qty 0.6

## 2022-05-16 LAB — TESTOSTERONE, FREE, TOTAL, SHBG
Sex Hormone Binding: 36.3 nmol/L (ref 19.3–76.4)
Testosterone, Free: <0.2 pg/mL — ABNORMAL LOW (ref 6.6–18.1)
Testosterone: <3 ng/dL — ABNORMAL LOW (ref 264–916)

## 2022-05-25 ENCOUNTER — Other Ambulatory Visit: Payer: Self-pay

## 2022-05-26 ENCOUNTER — Other Ambulatory Visit (HOSPITAL_COMMUNITY): Payer: Self-pay

## 2022-06-01 DIAGNOSIS — D696 Thrombocytopenia, unspecified: Secondary | ICD-10-CM | POA: Insufficient documentation

## 2022-06-01 MED FILL — Dexamethasone Sodium Phosphate Inj 100 MG/10ML: INTRAMUSCULAR | Qty: 1 | Status: AC

## 2022-06-01 NOTE — Assessment & Plan Note (Signed)
-  he received palliative RT to T10-SI joints under Dr. Tammi Klippel 07/21/21 - 08/03/21 -Zometa started on 07/06/21, will continue every 3 months.  -s/p kyphoplasty on 10/18/21. Path showed necrosis, malignancy was not excluded. -continue gabapentin and celebrex, in addition to calcium and vit D.

## 2022-06-01 NOTE — Assessment & Plan Note (Signed)
-  secondary to bone mets -improved with chemo

## 2022-06-01 NOTE — Progress Notes (Signed)
[place]   Telephone:([phone]) [phone] Fax:(336) [phone]   Clinic Follow up Note   Patient Care Team: [name], MD as PCP - General [name] [name], MD as PCP - Infectious Diseases (Infectious Diseases) Katheren Puller, RN as Oncology Nurse Navigator  Date of Service:  06/02/2022  CHIEF COMPLAINT: f/u of metastatic prostate cancer   CURRENT THERAPY:   -Docetaxel, q21d, starting 03/31/22 -Lupron, started 07/06/21, now q41month -Zometa, q352month starting 07/06/21  ASSESSMENT:  Patrick [name] is a [age].o. male with   Prostate cancer metastatic to bone ([facility]-diagnosed in 06/2021 -initial PSA>3000 --he started casodex on 1/10, and Lupron and Zometa on 07/06/21 in the hospital, and Zytiga 100050mith prednisone 18m54m mid 07/2021, tolerating very well. -his PSA initially responded well, coming down to 0.5 on 10/26/21. Unfortunately, his PSA rose to 19.3 on 01/19/22 and 99.6 on 02/13/22. -PSMA PET scan on 03/23/22 confirmed widespread intensely radiotracer-avid skeletal metastasis involving axillary and appendicular skeleton; no abnormal activity in prostate gland or lymph nodes. -his Zytiga was switched to chemo doxetaxel on 03/31/22, plan for up to 10 cycles. 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 MannTammi Klippel6/23 - 08/03/21 -Zometa started on 07/06/21, will continue every 3 months.  -s/p kyphoplasty on 10/18/21. Path showed necrosis, malignancy was not excluded. -continue gabapentin and celebrex, in addition to calcium and vit D.       PLAN: - lab reviewed.Hmg 9.4, plt normal -proceed with C4 Docetaxel at full dose 75mg59mwith GCSF on day 4  -Zometa every 3 mths, next due in Feb  -lab/fu/ with chemo Docetaxel in 4 weeks (postpone for a week due to  holidays)     SUMMARY OF ONCOLOGIC HISTORY: Oncology History Overview Note   Cancer Staging  [facility], AJCC 8th Edition - Clinical stage from 07/01/2021: Stage IVB (cTX, cNX, pM1b) - Signed by Addyson Traub,Truitt Merleon 07/05/2021    Prostate cancer metastatic to bone (HCC)[facility]5/2023 Tumor Marker   Patient's tumor was tested for the following markers: PSA. Results of the tumor marker test revealed >3,000.   07/01/2021 Cancer Staging   Staging form: Prostate, AJCC 8th Edition - Clinical stage from 07/01/2021: Stage IVB (cTX, cNX, pM1b) - Signed by Joud Pettinato,Truitt Merleon 07/05/2021   07/01/2021 Imaging   EXAM: CT CHEST, ABDOMEN, AND PELVIS WITH CONTRAST  IMPRESSION: Widespread osseous metastatic disease of unknown primary. Numerous pathologic fractures involving the ribs bilaterally as well as the T11, T12, L2, L3, and L4 vertebral bodies. Retropulsion of T11 and L3 results in severe central canal stenosis at L3. Multifactorial severe central canal stenosis also noted at L4, in part related to metastatic disease. Prominent soft tissue component involving the metastasis involving the spinous process of L5 may provide an appropriate target for tissue sampling for further evaluation.   Mild coronary artery calcification.   Thoracic aortic aneurysm with maximal transaxial dimension of 4.9 cm. Ascending thoracic aortic aneurysm. Recommend semi-annual imaging followup by CTA or MRA and referral to cardiothoracic surgery if not already obtained. This recommendation follows 2010 ACCF/AHA/AATS/ACR/ASA/SCA/SCAI/SIR/STS/SVM Guidelines for the Diagnosis and Management of Patients With Thoracic Aortic Disease. Circulation. 2010; 121: E26: R740-C144tic aneurysm NOS (ICD10-I71.9)   Moderate enlargement of the prostate gland. Correlation with serum PSA may  be helpful.   07/01/2021 Imaging   EXAM: MRI HEAD WITHOUT CONTRAST  IMPRESSION: 1. No evidence of  intracranial metastases on this noncontrast study. 2. Diffusely abnormal bone marrow signal consistent with known widespread osseous metastases.   07/01/2021 Pathology Results   FINAL MICROSCOPIC DIAGNOSIS:   A. SOFT TISSUE MASS, L5, NEEDLE CORE BIOPSY:  -  Metastatic carcinoma  -  See comment   COMMENT:  By immunohistochemistry, the neoplastic cells are positive for PSA and prostein but negative for cytokeratin 7, cytokeratin 20, CDX2 and TTF-1. Overall, the immunoprofile is consistent with a prostatic primary.   07/02/2021 Initial Diagnosis   Prostate cancer metastatic to bone ([place])   07/21/2021 - 08/03/2021 Radiation Therapy   The painful site of metastasis at L5 was treated to 30 Gy in 10 fractions of 3 Gy each.   08/03/2021 Tumor Marker   Patient's tumor was tested for the following markers: PSA. Results of the tumor marker test revealed 76.8.   03/31/2022 -  Chemotherapy   Patient is on Treatment Plan : PROSTATE Docetaxel (75) + Prednisone q21d     04/30/2022 Genetic Testing   Negative genetic testing on the CancerNext-Expanded+RNAinsight panel.  The report date is April 30, 2022.  The CancerNext-Expanded gene panel offered by [facility] and includes sequencing and rearrangement analysis for the following 77 genes: AIP, ALK, APC*, ATM*, AXIN2, BAP1, BARD1, BLM, BMPR1A, BRCA1*, BRCA2*, BRIP1*, CDC73, CDH1*, CDK4, CDKN1B, CDKN2A, CHEK2*, CTNNA1, DICER1, FANCC, FH, FLCN, GALNT12, KIF1B, LZTR1, MAX, MEN1, MET, MLH1*, MSH2*, MSH3, MSH6*, MUTYH*, NBN, NF1*, NF2, NTHL1, PALB2*, PHOX2B, PMS2*, POT1, PRKAR1A, PTCH1, PTEN*, RAD51C*, RAD51D*, RB1, RECQL, RET, SDHA, SDHAF2, SDHB, SDHC, SDHD, SMAD4, SMARCA4, SMARCB1, SMARCE1, STK11, SUFU, TMEM127, TP53*, TSC1, TSC2, VHL and XRCC2 (sequencing and deletion/duplication); EGFR, EGLN1, HOXB13, KIT, MITF, PDGFRA, POLD1, and POLE (sequencing only); EPCAM and GREM1 (deletion/duplication only). DNA and RNA analyses performed for * genes.       INTERVAL  HISTORY:  Patrick [name] is here for a follow up of metastatic prostate cancer  He was last seen by me on  04/21/2022 He presents to the clinic alone. Pt reports the his energy level is about the same. He get around at home in a wheel chair. He states the Pain intensify when he's mobile in the legs and hips.    All other systems were reviewed with the patient and are negative.  MEDICAL HISTORY:  Past Medical History:  Diagnosis Date   Allergy    seasonal   Arthritis    r knee   H/O inguinal hernia repair    History of tobacco use    HIV infection ([place])    Hyperlipidemia    Hypertension    Strabismic amblyopia     SURGICAL HISTORY: Past Surgical History:  Procedure Laterality Date   EYE SURGERY     strabismus   HERNIA REPAIR     IR BONE TUMOR(S)RF ABLATION  10/18/2021   IR BONE TUMOR(S)RF ABLATION  10/18/2021   IR KYPHO EA ADDL LEVEL THORACIC OR LUMBAR  10/18/2021   IR KYPHO LUMBAR INC FX REDUCE BONE BX UNI/BIL CANNULATION INC/IMAGING  10/18/2021    I have reviewed the social history and family history with the patient and they are unchanged from previous note.  ALLERGIES:  has No Known Allergies.  MEDICATIONS:  Current Outpatient Medications  Medication Sig Dispense Refill   albuterol (VENTOLIN HFA) 108 (90 Base) MCG/ACT inhaler Inhale 2 puffs into the lungs every 6 (six) hours  as needed for shortness of breath or wheezing.     amLODipine (NORVASC) 5 MG tablet Take 1 tablet (5 mg total) by mouth daily. 30 tablet 11   atorvastatin (LIPITOR) 40 MG tablet TAKE 1 TABLET(40 MG) BY MOUTH DAILY 90 tablet 2   calcium citrate-vitamin D (CITRACAL+D) 315-200 MG-UNIT tablet Take 1 tablet by mouth daily.     celecoxib (CELEBREX) 200 MG capsule Take 1 capsule (200 mg total) by mouth 2 (two) times daily. 30 capsule 1   dexamethasone (DECADRON) 4 MG tablet Take 1 tablet by mouth 2 times daily starting day before chemo. Then take 1 tablet daily for 2 days starting day after chemo. Take  with food. 30 tablet 1   emtricitabine-rilpivir-tenofovir AF (ODEFSEY) 200-25-25 MG TABS tablet Take 1 tablet by mouth daily. 90 tablet 3   gabapentin (NEURONTIN) 100 MG capsule Take 2 - 3 capsules by mouth 3 times daily. 180 capsule 1   lidocaine (LIDODERM) 5 % Place 1 patch onto the skin daily. Remove & Discard patch within 12 hours or as directed by MD 30 patch 0   lidocaine-prilocaine (EMLA) cream Apply to affected area once 30 g 3   Multiple Vitamins-Minerals (MULTIVITAMIN WITH MINERALS) tablet Take 1 tablet by mouth daily.     ondansetron (ZOFRAN) 8 MG tablet Take 1 tablet (8 mg total) by mouth every 8 (eight) hours as needed for nausea or vomiting. 30 tablet 1   predniSONE (DELTASONE) 5 MG tablet Take 1 tablet (5 mg total) by mouth in the morning and at bedtime. 60 tablet 4   prochlorperazine (COMPAZINE) 10 MG tablet Take 1 tablet (10 mg total) by mouth every 6 (six) hours as needed for nausea or vomiting. 30 tablet 1   No current facility-administered medications for this visit.    PHYSICAL EXAMINATION: ECOG PERFORMANCE STATUS: 2 - Symptomatic, <50% confined to bed  Vitals:   06/02/22 0954  BP: 122/78  Pulse: 76  Resp: 16  Temp: 99 F (37.2 C)  SpO2: 100%   Wt Readings from Last 3 Encounters:  06/02/22 167 lb 9.6 oz (76 kg)  05/12/22 166 lb 9.6 oz (75.6 kg)  04/21/22 164 lb 8 oz (74.6 kg)    GENERAL:alert, no distress and comfortable SKIN: skin color, texture, turgor are normal, no rashes or significant lesions EYES: normal, Conjunctiva are pink and non-injected, sclera clear NECK: supple, thyroid normal size, non-tender, without nodularity LYMPH:  no palpable lymphadenopathy in the cervical, axillaryLUNGS: clear to auscultation and percussion with normal breathing effort HEART: regular rate & rhythm and no murmurs and no lower extremity edema ABDOMEN:abdomen soft, non-tender and normal bowel sounds Musculoskeletal:no cyanosis of digits and no clubbing  NEURO: alert &  oriented x 3 with fluent speech, no focal motor/sensory deficits  LABORATORY DATA:  I have reviewed the data as listed    Latest Ref Rng & Units 06/02/2022    9:29 AM 05/12/2022   10:35 AM 04/27/2022   12:52 PM  CBC  WBC 4.0 - 10.5 K/uL 9.5  6.5  3.7   Hemoglobin 13.0 - 17.0 g/dL 9.4  7.1  7.8   Hematocrit 39.0 - 52.0 % 28.7  22.0  23.8   Platelets 150 - 400 K/uL 156  115  98         Latest Ref Rng & Units 06/02/2022    9:29 AM 05/12/2022   10:35 AM 04/27/2022   12:52 PM  CMP  Glucose 70 - 99 mg/dL 124  118  106   BUN 8 - 23 mg/dL _0 Creatinine 0.61 - 1.24 mg/dL 0.55  0.59  0.64   Sodium 135 - 145 mmol/L 135  135  137   Potassium 3.5 - 5.1 mmol/L 4.5  4.7  5.1   Chloride 98 - 111 mmol/L 103  104  106   CO2 22 - 32 mmol/L _1 Calcium 8.9 - 10.3 mg/dL 8.9  9.1  8.6   Total Protein 6.5 - 8.1 g/dL 6.4  6.7  6.3   Total Bilirubin 0.3 - 1.2 mg/dL 0.4  0.5  0.7   Alkaline Phos 38 - 126 U/L 235  233  266   AST 15 - 41 U/L _2 ALT 0 - 44 U/L _3 RADIOGRAPHIC STUDIES: I have personally reviewed the radiological images as listed and agreed with the findings in the report. No results found.    Orders Placed This Encounter  Procedures   CBC with Differential ([place] Only)    Standing Status:   Future    Standing Expiration Date:   07/22/2023   CMP ([place] only)    Standing Status:   Future    Standing Expiration Date:   07/22/2023   All questions were answered. The patient knows to call the clinic with any problems, questions or concerns. No barriers to learning was detected. The total time spent in the appointment was 30 minutes.     Truitt Merle, MD 06/02/2022   Felicity Coyer, CMA, am acting as scribe for Truitt Merle, MD.   I have reviewed the above documentation for accuracy and completeness, and I agree with the above.

## 2022-06-01 NOTE — Assessment & Plan Note (Signed)
-  diagnosed in 06/2021 -initial PSA>3000 --he started casodex on 1/10, and Lupron and Zometa on 07/06/21 in the hospital, and Zytiga '1000mg'$  with prednisone '10mg'$  in mid 07/2021, tolerating very well. -his PSA initially responded well, coming down to 0.5 on 10/26/21. Unfortunately, his PSA rose to 19.3 on 01/19/22 and 99.6 on 02/13/22. -PSMA PET scan on 03/23/22 confirmed widespread intensely radiotracer-avid skeletal metastasis involving axillary and appendicular skeleton; no abnormal activity in prostate gland or lymph nodes. -his Zytiga was switched to chemo doxetaxel on 03/31/22, plan for up to 10 cycles. He is overall tolerating well, PSA is trending down

## 2022-06-02 ENCOUNTER — Encounter: Payer: Self-pay | Admitting: Hematology

## 2022-06-02 ENCOUNTER — Inpatient Hospital Stay: Payer: PPO

## 2022-06-02 ENCOUNTER — Inpatient Hospital Stay: Payer: PPO | Attending: Hematology | Admitting: Hematology

## 2022-06-02 VITALS — BP 122/78 | HR 76 | Temp 99.0°F | Resp 16 | Ht 70.0 in | Wt 167.6 lb

## 2022-06-02 VITALS — BP 130/76 | HR 67 | Temp 98.7°F | Resp 16

## 2022-06-02 DIAGNOSIS — Z5111 Encounter for antineoplastic chemotherapy: Secondary | ICD-10-CM | POA: Insufficient documentation

## 2022-06-02 DIAGNOSIS — B20 Human immunodeficiency virus [HIV] disease: Secondary | ICD-10-CM | POA: Diagnosis not present

## 2022-06-02 DIAGNOSIS — Z5189 Encounter for other specified aftercare: Secondary | ICD-10-CM | POA: Diagnosis not present

## 2022-06-02 DIAGNOSIS — D6959 Other secondary thrombocytopenia: Secondary | ICD-10-CM | POA: Diagnosis not present

## 2022-06-02 DIAGNOSIS — D696 Thrombocytopenia, unspecified: Secondary | ICD-10-CM | POA: Diagnosis not present

## 2022-06-02 DIAGNOSIS — C7951 Secondary malignant neoplasm of bone: Secondary | ICD-10-CM | POA: Insufficient documentation

## 2022-06-02 DIAGNOSIS — M8450XA Pathological fracture in neoplastic disease, unspecified site, initial encounter for fracture: Secondary | ICD-10-CM | POA: Diagnosis not present

## 2022-06-02 DIAGNOSIS — C61 Malignant neoplasm of prostate: Secondary | ICD-10-CM | POA: Diagnosis not present

## 2022-06-02 LAB — CMP (CANCER CENTER ONLY)
ALT: 16 U/L (ref 0–44)
AST: 19 U/L (ref 15–41)
Albumin: 4.2 g/dL (ref 3.5–5.0)
Alkaline Phosphatase: 235 U/L — ABNORMAL HIGH (ref 38–126)
Anion gap: 7 (ref 5–15)
BUN: 15 mg/dL (ref 8–23)
CO2: 25 mmol/L (ref 22–32)
Calcium: 8.9 mg/dL (ref 8.9–10.3)
Chloride: 103 mmol/L (ref 98–111)
Creatinine: 0.55 mg/dL — ABNORMAL LOW (ref 0.61–1.24)
GFR, Estimated: >60 mL/min (ref >60)
Glucose, Bld: 124 mg/dL — ABNORMAL HIGH (ref 70–99)
Potassium: 4.5 mmol/L (ref 3.5–5.1)
Sodium: 135 mmol/L (ref 135–145)
Total Bilirubin: 0.4 mg/dL (ref 0.3–1.2)
Total Protein: 6.4 g/dL — ABNORMAL LOW (ref 6.5–8.1)

## 2022-06-02 LAB — CBC WITH DIFFERENTIAL (CANCER CENTER ONLY)
Abs Immature Granulocytes: 0.35 10*3/uL — ABNORMAL HIGH (ref 0.00–0.07)
Basophils Absolute: 0 10*3/uL (ref 0.0–0.1)
Basophils Relative: 0 %
Eosinophils Absolute: 0 10*3/uL (ref 0.0–0.5)
Eosinophils Relative: 0 %
HCT: 28.7 % — ABNORMAL LOW (ref 39.0–52.0)
Hemoglobin: 9.4 g/dL — ABNORMAL LOW (ref 13.0–17.0)
Immature Granulocytes: 4 %
Lymphocytes Relative: 11 %
Lymphs Abs: 1 10*3/uL (ref 0.7–4.0)
MCH: 30.9 pg (ref 26.0–34.0)
MCHC: 32.8 g/dL (ref 30.0–36.0)
MCV: 94.4 fL (ref 80.0–100.0)
Monocytes Absolute: 0.7 10*3/uL (ref 0.1–1.0)
Monocytes Relative: 7 %
Neutro Abs: 7.4 10*3/uL (ref 1.7–7.7)
Neutrophils Relative %: 78 %
Platelet Count: 156 10*3/uL (ref 150–400)
RBC: 3.04 MIL/uL — ABNORMAL LOW (ref 4.22–5.81)
RDW: 21.2 % — ABNORMAL HIGH (ref 11.5–15.5)
WBC Count: 9.5 10*3/uL (ref 4.0–10.5)
nRBC: 0.7 % — ABNORMAL HIGH (ref 0.0–0.2)

## 2022-06-02 MED ORDER — SODIUM CHLORIDE 0.9 % IV SOLN: Freq: Once | INTRAVENOUS | Status: AC

## 2022-06-02 MED ORDER — SODIUM CHLORIDE 0.9 % IV SOLN
10.0000 mg | Freq: Once | INTRAVENOUS | Status: AC
  Administered 2022-06-02: 10 mg via INTRAVENOUS
  Filled 2022-06-02: qty 10

## 2022-06-02 MED ORDER — SODIUM CHLORIDE 0.9 % IV SOLN
75.0000 mg/m2 | Freq: Once | INTRAVENOUS | Status: AC
  Administered 2022-06-02: 150 mg via INTRAVENOUS
  Filled 2022-06-02: qty 15

## 2022-06-02 NOTE — Patient Instructions (Signed)
[facility] MEDICAL ONCOLOGY  Discharge Instructions: Thank you for choosing [facility] to provide your oncology and hematology care.   If you have a lab appointment with the Cancer Center, please go directly to the Cancer Center and check in at the registration area.   Wear comfortable clothing and clothing appropriate for easy access to any Portacath or PICC line.   We strive to give you quality time with your provider. You may need to reschedule your appointment if you arrive late (15 or more minutes).  Arriving late affects you and other patients whose appointments are after yours.  Also, if you miss three or more appointments without notifying the office, you may be dismissed from the clinic at the provider's discretion.      For prescription refill requests, have your pharmacy contact our office and allow 72 hours for refills to be completed.    Today you received the following chemotherapy and/or immunotherapy agents: Docetaxel      To help prevent nausea and vomiting after your treatment, we encourage you to take your nausea medication as directed.  BELOW ARE SYMPTOMS THAT SHOULD BE REPORTED IMMEDIATELY: *FEVER GREATER THAN 100.4 F (38 C) OR HIGHER *CHILLS OR SWEATING *NAUSEA AND VOMITING THAT IS NOT CONTROLLED WITH YOUR NAUSEA MEDICATION *UNUSUAL SHORTNESS OF BREATH *UNUSUAL BRUISING OR BLEEDING *URINARY PROBLEMS (pain or burning when urinating, or frequent urination) *BOWEL PROBLEMS (unusual diarrhea, constipation, pain near the anus) TENDERNESS IN MOUTH AND THROAT WITH OR WITHOUT PRESENCE OF ULCERS (sore throat, sores in mouth, or a toothache) UNUSUAL RASH, SWELLING OR PAIN  UNUSUAL VAGINAL DISCHARGE OR ITCHING   Items with * indicate a potential emergency and should be followed up as soon as possible or go to the Emergency Department if any problems should occur.  Please show the CHEMOTHERAPY ALERT CARD or IMMUNOTHERAPY ALERT CARD at check-in to  the Emergency Department and triage nurse.  Should you have questions after your visit or need to cancel or reschedule your appointment, please contact [facility]ICAL ONCOLOG[facility]  Dept: [phone]  and follow the prompts.  Office hours are 8:00 a.m. to 4:30 p.m. Monday - Friday. Please note that voicemails left after 4:00 p.m. may not be returned until the following business day.  We are closed weekends and major holidays. You have access to a nurse at all times for urgent questions. Please call the main number to the clinic Dept: [phone] and follow the prompts.   For any non-urgent questions, you may also contact your provider using MyChart. We now offer e-Visits for anyone 18 and older to request care online for non-urgent symptoms. For details visit [email or web address].   Also download the MyChart app! Go to the app store, search "MyChart", open the app, select [facility], and log in with your MyChart username and password.  Masks are optional in the cancer centers. If you would like for your care team to wear a mask while they are taking care of you, please let them know. You may have one support person who is at least 72 years old accompany you for your appointments. 

## 2022-06-03 DIAGNOSIS — M6281 Muscle weakness (generalized): Secondary | ICD-10-CM | POA: Diagnosis not present

## 2022-06-03 DIAGNOSIS — M6258 Muscle wasting and atrophy, not elsewhere classified, other site: Secondary | ICD-10-CM | POA: Diagnosis not present

## 2022-06-04 ENCOUNTER — Other Ambulatory Visit: Payer: Self-pay

## 2022-06-05 ENCOUNTER — Inpatient Hospital Stay: Payer: PPO

## 2022-06-05 ENCOUNTER — Other Ambulatory Visit: Payer: Self-pay

## 2022-06-05 VITALS — BP 115/72 | HR 89 | Temp 98.6°F | Resp 18

## 2022-06-05 DIAGNOSIS — C61 Malignant neoplasm of prostate: Secondary | ICD-10-CM

## 2022-06-05 DIAGNOSIS — Z5111 Encounter for antineoplastic chemotherapy: Secondary | ICD-10-CM | POA: Diagnosis not present

## 2022-06-05 MED ORDER — PEGFILGRASTIM-CBQV 6 MG/0.6ML ~~LOC~~ SOSY
6.0000 mg | PREFILLED_SYRINGE | Freq: Once | SUBCUTANEOUS | Status: AC
  Administered 2022-06-05: 6 mg via SUBCUTANEOUS
  Filled 2022-06-05: qty 0.6

## 2022-06-07 ENCOUNTER — Other Ambulatory Visit (HOSPITAL_COMMUNITY): Payer: Self-pay

## 2022-06-26 ENCOUNTER — Other Ambulatory Visit (HOSPITAL_COMMUNITY): Payer: Self-pay

## 2022-06-26 ENCOUNTER — Other Ambulatory Visit: Payer: Self-pay | Admitting: Hematology

## 2022-06-26 DIAGNOSIS — C61 Malignant neoplasm of prostate: Secondary | ICD-10-CM

## 2022-06-26 MED ORDER — GABAPENTIN 100 MG PO CAPS
200.0000 mg | ORAL_CAPSULE | Freq: Three times a day (TID) | ORAL | 1 refills | Status: DC
  Filled 2022-06-26: qty 180, 20d supply, fill #0

## 2022-06-27 ENCOUNTER — Other Ambulatory Visit: Payer: Self-pay

## 2022-06-29 MED FILL — Dexamethasone Sodium Phosphate Inj 100 MG/10ML: INTRAMUSCULAR | Qty: 1 | Status: AC

## 2022-06-29 NOTE — Progress Notes (Signed)
[name]   Telephone:(336) [phone] Fax:(336) [phone]   Clinic Follow up Note   Patient Care Team: [name], MD as PCP - General [name] [name], MD as PCP - Infectious Diseases (Infectious Diseases) Katheren Puller, RN as Oncology Nurse Navigator  Date of Service:  06/30/2022  CHIEF COMPLAINT: f/u of metastatic prostate cancer     CURRENT THERAPY:  -Docetaxel, q21d, starting 03/31/22 -Lupron, started 07/06/21, now q35month -Zometa, q314month starting 07/06/21     ASSESSMENT:  Patrick [name] is a [age].o. male with   Prostate cancer metastatic to bone ([facility]-diagnosed in 06/2021 -initial PSA>3000 --he started casodex on 1/10, and Lupron and Zometa on 07/06/21 in the hospital, and Zytiga 100040mith prednisone 60m68m mid 07/2021, tolerating very well. -his PSA initially responded well, coming down to 0.5 on 10/26/21. Unfortunately, his PSA rose to 19.3 on 01/19/22 and 99.6 on 02/13/22. -PSMA PET scan on 03/23/22 confirmed widespread intensely radiotracer-avid skeletal metastasis involving axillary and appendicular skeleton; no abnormal activity in prostate gland or lymph nodes. -his Zytiga was switched to chemo doxetaxel on 03/31/22, plan for up to 10 cycles. 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 MannTammi Klippel6/23 - 08/03/21 -Zometa started on 07/06/21, will continue every 3 months.  -s/p kyphoplasty on 10/18/21. Path showed necrosis, malignancy was not excluded. -continue gabapentin and celebrex, in addition to calcium and vit D.     PLAN: -lab reviewed -Proceed with C5  Docetaxel at full dose 75mg56m -Continue with the Zometa q3mont32month due in FEB - Explain that nose bleed is due to chemo -lab,f/u,chemo,07/21/22    SUMMARY OF ONCOLOGIC HISTORY: Oncology History Overview Note   Cancer Staging  [facility] to bone  [facility], AJCC 8th Edition - Clinical stage from 07/01/2021: Stage IVB (cTX, cNX, pM1b) - Signed by Zalia Hautala, Truitt Merlen 07/05/2021    Prostate cancer metastatic to bone (HCC) [facility]/2023 Tumor Marker   Patient's tumor was tested for the following markers: PSA. Results of the tumor marker test revealed >3,000.   07/01/2021 Cancer Staging   Staging form: Prostate, AJCC 8th Edition - Clinical stage from 07/01/2021: Stage IVB (cTX, cNX, pM1b) - Signed by Pema Thomure, Truitt Merlen 07/05/2021   07/01/2021 Imaging   EXAM: CT CHEST, ABDOMEN, AND PELVIS WITH CONTRAST  IMPRESSION: Widespread osseous metastatic disease of unknown primary. Numerous pathologic fractures involving the ribs bilaterally as well as the T11, T12, L2, L3, and L4 vertebral bodies. Retropulsion of T11 and L3 results in severe central canal stenosis at L3. Multifactorial severe central canal stenosis also noted at L4, in part related to metastatic disease. Prominent soft tissue component involving the metastasis involving the spinous process of L5 may provide an appropriate target for tissue sampling for further evaluation.   Mild coronary artery calcification.   Thoracic aortic aneurysm with maximal transaxial dimension of 4.9 cm. Ascending thoracic aortic aneurysm. Recommend semi-annual imaging followup by CTA or MRA and referral to cardiothoracic surgery if not already obtained. This recommendation follows 2010 ACCF/AHA/AATS/ACR/ASA/SCA/SCAI/SIR/STS/SVM Guidelines for the Diagnosis and Management of Patients With Thoracic Aortic Disease. Circulation. 2010; 121: E266: P619-J093ic aneurysm NOS (ICD10-I71.9)   Moderate enlargement of the prostate gland. Correlation with serum PSA may be helpful.   07/01/2021 Imaging   EXAM: MRI HEAD WITHOUT CONTRAST  IMPRESSION: 1. No evidence of intracranial metastases on this  noncontrast study. 2. Diffusely abnormal bone marrow signal consistent with known widespread osseous  metastases.   07/01/2021 Pathology Results   FINAL MICROSCOPIC DIAGNOSIS:   A. SOFT TISSUE MASS, L5, NEEDLE CORE BIOPSY:  -  Metastatic carcinoma  -  See comment   COMMENT:  By immunohistochemistry, the neoplastic cells are positive for PSA and prostein but negative for cytokeratin 7, cytokeratin 20, CDX2 and TTF-1. Overall, the immunoprofile is consistent with a prostatic primary.   07/02/2021 Initial Diagnosis   Prostate cancer metastatic to bone ([place])   07/21/2021 - 08/03/2021 Radiation Therapy   The painful site of metastasis at L5 was treated to 30 Gy in 10 fractions of 3 Gy each.   08/03/2021 Tumor Marker   Patient's tumor was tested for the following markers: PSA. Results of the tumor marker test revealed 76.8.   03/31/2022 -  Chemotherapy   Patient is on Treatment Plan : PROSTATE Docetaxel (75) + Prednisone q21d     04/30/2022 Genetic Testing   Negative genetic testing on the CancerNext-Expanded+RNAinsight panel.  The report date is April 30, 2022.  The CancerNext-Expanded gene panel offered by [facility] and includes sequencing and rearrangement analysis for the following 77 genes: AIP, ALK, APC*, ATM*, AXIN2, BAP1, BARD1, BLM, BMPR1A, BRCA1*, BRCA2*, BRIP1*, CDC73, CDH1*, CDK4, CDKN1B, CDKN2A, CHEK2*, CTNNA1, DICER1, FANCC, FH, FLCN, GALNT12, KIF1B, LZTR1, MAX, MEN1, MET, MLH1*, MSH2*, MSH3, MSH6*, MUTYH*, NBN, NF1*, NF2, NTHL1, PALB2*, PHOX2B, PMS2*, POT1, PRKAR1A, PTCH1, PTEN*, RAD51C*, RAD51D*, RB1, RECQL, RET, SDHA, SDHAF2, SDHB, SDHC, SDHD, SMAD4, SMARCA4, SMARCB1, SMARCE1, STK11, SUFU, TMEM127, TP53*, TSC1, TSC2, VHL and XRCC2 (sequencing and deletion/duplication); EGFR, EGLN1, HOXB13, KIT, MITF, PDGFRA, POLD1, and POLE (sequencing only); EPCAM and GREM1 (deletion/duplication only). DNA and RNA analyses performed for * genes.       INTERVAL HISTORY:  Patrick [name] is here for a follow up of metastatic prostate cancer    He was last seen by me on 06/02/22 He presents  to the clinic alone. Pt reports the last cycle of chemo he notice pain had gotten intense in the lower back. He take in the tylenol extra strength and 3 Gabapentin seems to help. Pt reports he has had some nose bleed. He had to pinch his nose and hold for about 15 mins then it stopped. Pt reports that he had some numbness and tingling in feet and some in the tips of his fingers.   All other systems were reviewed with the patient and are negative.  MEDICAL HISTORY:  Past Medical History:  Diagnosis Date   Allergy    seasonal   Arthritis    r knee   H/O inguinal hernia repair    History of tobacco use    HIV infection ([place])    Hyperlipidemia    Hypertension    Strabismic amblyopia     SURGICAL HISTORY: Past Surgical History:  Procedure Laterality Date   EYE SURGERY     strabismus   HERNIA REPAIR     IR BONE TUMOR(S)RF ABLATION  10/18/2021   IR BONE TUMOR(S)RF ABLATION  10/18/2021   IR KYPHO EA ADDL LEVEL THORACIC OR LUMBAR  10/18/2021   IR KYPHO LUMBAR INC FX REDUCE BONE BX UNI/BIL CANNULATION INC/IMAGING  10/18/2021    I have reviewed the social history and family history with the patient and they are unchanged from previous note.  ALLERGIES:  has No Known Allergies.  MEDICATIONS:  Current Outpatient Medications  Medication Sig Dispense Refill   gabapentin (  NEURONTIN) 300 MG capsule Take 1 capsule (300 mg total) by mouth 3 (three) times daily. 90 capsule 1   albuterol (VENTOLIN HFA) 108 (90 Base) MCG/ACT inhaler Inhale 2 puffs into the lungs every 6 (six) hours as needed for shortness of breath or wheezing.     amLODipine (NORVASC) 5 MG tablet Take 1 tablet (5 mg total) by mouth daily. 30 tablet 11   atorvastatin (LIPITOR) 40 MG tablet TAKE 1 TABLET(40 MG) BY MOUTH DAILY 90 tablet 2   calcium citrate-vitamin D (CITRACAL+D) 315-200 MG-UNIT tablet Take 1 tablet by mouth daily.     celecoxib (CELEBREX) 200 MG capsule Take 1 capsule (200 mg total) by mouth 2 (two) times daily. 30  capsule 1   dexamethasone (DECADRON) 4 MG tablet Take 1 tablet by mouth 2 times daily starting day before chemo. Then take 1 tablet daily for 2 days starting day after chemo. Take with food. 30 tablet 1   emtricitabine-rilpivir-tenofovir AF (ODEFSEY) 200-25-25 MG TABS tablet Take 1 tablet by mouth daily. 90 tablet 3   lidocaine (LIDODERM) 5 % Place 1 patch onto the skin daily. Remove & Discard patch within 12 hours or as directed by MD 30 patch 0   lidocaine-prilocaine (EMLA) cream Apply to affected area once 30 g 3   Multiple Vitamins-Minerals (MULTIVITAMIN WITH MINERALS) tablet Take 1 tablet by mouth daily.     ondansetron (ZOFRAN) 8 MG tablet Take 1 tablet (8 mg total) by mouth every 8 (eight) hours as needed for nausea or vomiting. 30 tablet 1   predniSONE (DELTASONE) 5 MG tablet Take 1 tablet (5 mg total) by mouth in the morning and at bedtime. 60 tablet 4   prochlorperazine (COMPAZINE) 10 MG tablet Take 1 tablet (10 mg total) by mouth every 6 (six) hours as needed for nausea or vomiting. 30 tablet 1   No current facility-administered medications for this visit.   Facility-Administered Medications Ordered in Other Visits  Medication Dose Route Frequency Provider Last Rate Last Admin   DOCEtaxel (TAXOTERE) 150 mg in sodium chloride 0.9 % 250 mL chemo infusion  75 mg/m2 (Treatment Plan Recorded) Intravenous Once Gardenia Phlegm, NP 265 mL/hr at 06/30/22 1230 150 mg at 06/30/22 1230    PHYSICAL EXAMINATION: ECOG PERFORMANCE STATUS: 2 - Symptomatic, <50% confined to bed  Vitals:   06/30/22 1035  BP: 115/76  Pulse: 83  Resp: 18  Temp: 97.9 F (36.6 C)  SpO2: 100%   Wt Readings from Last 3 Encounters:  06/30/22 178 lb 9.6 oz (81 kg)  06/02/22 167 lb 9.6 oz (76 kg)  05/12/22 166 lb 9.6 oz (75.6 kg)     GENERAL:alert, no distress and comfortable SKIN: skin color normal, no rashes or significant lesions EYES: normal, Conjunctiva are pink and non-injected, sclera clear   NEURO: alert & oriented x 3 with fluent speech TUNING FORK: Mild  sensation LABORATORY DATA:  I have reviewed the data as listed    Latest Ref Rng & Units 06/30/2022   10:11 AM 06/02/2022    9:29 AM 05/12/2022   10:35 AM  CBC  WBC 4.0 - 10.5 K/uL 4.7  9.5  6.5   Hemoglobin 13.0 - 17.0 g/dL 9.8  9.4  7.1   Hematocrit 39.0 - 52.0 % 29.0  28.7  22.0   Platelets 150 - 400 K/uL 169  156  115         Latest Ref Rng & Units 06/30/2022   10:11 AM 06/02/2022  9:29 AM 05/12/2022   10:35 AM  CMP  Glucose 70 - 99 mg/dL 129  124  118   BUN 8 - 23 mg/dL _0 Creatinine 0.61 - 1.24 mg/dL 0.61  0.55  0.59   Sodium 135 - 145 mmol/L 136  135  135   Potassium 3.5 - 5.1 mmol/L 4.7  4.5  4.7   Chloride 98 - 111 mmol/L 103  103  104   CO2 22 - 32 mmol/L _1 Calcium 8.9 - 10.3 mg/dL 9.3  8.9  9.1   Total Protein 6.5 - 8.1 g/dL 6.2  6.4  6.7   Total Bilirubin 0.3 - 1.2 mg/dL 0.5  0.4  0.5   Alkaline Phos 38 - 126 U/L 161  235  233   AST 15 - 41 U/L _2 ALT 0 - 44 U/L _3 RADIOGRAPHIC STUDIES: I have personally reviewed the radiological images as listed and agreed with the findings in the report. No results found.    Orders Placed This Encounter  Procedures   CBC with Differential ([place] Only)    Standing Status:   Future    Standing Expiration Date:   08/12/2023   CMP ([place] only)    Standing Status:   Future    Standing Expiration Date:   08/12/2023   All questions were answered. The patient knows to call the clinic with any problems, questions or concerns. No barriers to learning was detected. The total time spent in the appointment was 30 minutes.     Truitt Merle, MD 06/30/2022   Felicity Coyer, CMA, am acting as scribe for Truitt Merle, MD.   I have reviewed the above documentation for accuracy and completeness, and I agree with the above.

## 2022-06-30 ENCOUNTER — Inpatient Hospital Stay: Payer: PPO

## 2022-06-30 ENCOUNTER — Inpatient Hospital Stay: Payer: PPO | Attending: Hematology

## 2022-06-30 ENCOUNTER — Encounter: Payer: Self-pay | Admitting: Hematology

## 2022-06-30 ENCOUNTER — Inpatient Hospital Stay (HOSPITAL_BASED_OUTPATIENT_CLINIC_OR_DEPARTMENT_OTHER): Payer: PPO | Admitting: Hematology

## 2022-06-30 VITALS — BP 115/76 | HR 83 | Temp 97.9°F | Resp 18 | Ht 70.0 in | Wt 178.6 lb

## 2022-06-30 DIAGNOSIS — C7951 Secondary malignant neoplasm of bone: Secondary | ICD-10-CM

## 2022-06-30 DIAGNOSIS — Z79899 Other long term (current) drug therapy: Secondary | ICD-10-CM | POA: Insufficient documentation

## 2022-06-30 DIAGNOSIS — Z5189 Encounter for other specified aftercare: Secondary | ICD-10-CM | POA: Diagnosis not present

## 2022-06-30 DIAGNOSIS — C61 Malignant neoplasm of prostate: Secondary | ICD-10-CM

## 2022-06-30 DIAGNOSIS — B20 Human immunodeficiency virus [HIV] disease: Secondary | ICD-10-CM | POA: Diagnosis not present

## 2022-06-30 DIAGNOSIS — Z5111 Encounter for antineoplastic chemotherapy: Secondary | ICD-10-CM | POA: Diagnosis not present

## 2022-06-30 DIAGNOSIS — M8450XA Pathological fracture in neoplastic disease, unspecified site, initial encounter for fracture: Secondary | ICD-10-CM | POA: Diagnosis not present

## 2022-06-30 LAB — CBC WITH DIFFERENTIAL (CANCER CENTER ONLY)
Abs Immature Granulocytes: 0.05 10*3/uL (ref 0.00–0.07)
Basophils Absolute: 0 10*3/uL (ref 0.0–0.1)
Basophils Relative: 0 %
Eosinophils Absolute: 0 10*3/uL (ref 0.0–0.5)
Eosinophils Relative: 0 %
HCT: 29 % — ABNORMAL LOW (ref 39.0–52.0)
Hemoglobin: 9.8 g/dL — ABNORMAL LOW (ref 13.0–17.0)
Immature Granulocytes: 1 %
Lymphocytes Relative: 9 %
Lymphs Abs: 0.4 10*3/uL — ABNORMAL LOW (ref 0.7–4.0)
MCH: 32.7 pg (ref 26.0–34.0)
MCHC: 33.8 g/dL (ref 30.0–36.0)
MCV: 96.7 fL (ref 80.0–100.0)
Monocytes Absolute: 0.5 10*3/uL (ref 0.1–1.0)
Monocytes Relative: 10 %
Neutro Abs: 3.7 10*3/uL (ref 1.7–7.7)
Neutrophils Relative %: 80 %
Platelet Count: 169 10*3/uL (ref 150–400)
RBC: 3 MIL/uL — ABNORMAL LOW (ref 4.22–5.81)
RDW: 20.6 % — ABNORMAL HIGH (ref 11.5–15.5)
WBC Count: 4.7 10*3/uL (ref 4.0–10.5)
nRBC: 0 % (ref 0.0–0.2)

## 2022-06-30 LAB — CMP (CANCER CENTER ONLY)
ALT: 13 U/L (ref 0–44)
AST: 22 U/L (ref 15–41)
Albumin: 4.5 g/dL (ref 3.5–5.0)
Alkaline Phosphatase: 161 U/L — ABNORMAL HIGH (ref 38–126)
Anion gap: 10 (ref 5–15)
BUN: 16 mg/dL (ref 8–23)
CO2: 23 mmol/L (ref 22–32)
Calcium: 9.3 mg/dL (ref 8.9–10.3)
Chloride: 103 mmol/L (ref 98–111)
Creatinine: 0.61 mg/dL (ref 0.61–1.24)
GFR, Estimated: >60 mL/min (ref >60)
Glucose, Bld: 129 mg/dL — ABNORMAL HIGH (ref 70–99)
Potassium: 4.7 mmol/L (ref 3.5–5.1)
Sodium: 136 mmol/L (ref 135–145)
Total Bilirubin: 0.5 mg/dL (ref 0.3–1.2)
Total Protein: 6.2 g/dL — ABNORMAL LOW (ref 6.5–8.1)

## 2022-06-30 MED ORDER — SODIUM CHLORIDE 0.9 % IV SOLN: Freq: Once | INTRAVENOUS | Status: AC

## 2022-06-30 MED ORDER — SODIUM CHLORIDE 0.9 % IV SOLN
10.0000 mg | Freq: Once | INTRAVENOUS | Status: AC
  Administered 2022-06-30: 10 mg via INTRAVENOUS
  Filled 2022-06-30: qty 10

## 2022-06-30 MED ORDER — SODIUM CHLORIDE 0.9 % IV SOLN
75.0000 mg/m2 | Freq: Once | INTRAVENOUS | Status: AC
  Administered 2022-06-30: 150 mg via INTRAVENOUS
  Filled 2022-06-30: qty 15

## 2022-06-30 MED ORDER — GABAPENTIN 300 MG PO CAPS: 300.0000 mg | ORAL_CAPSULE | Freq: Three times a day (TID) | ORAL | 1 refills | Status: DC

## 2022-06-30 NOTE — Assessment & Plan Note (Signed)
-  diagnosed in 06/2021 -initial PSA>3000 --he started casodex on 1/10, and Lupron and Zometa on 07/06/21 in the hospital, and Zytiga '1000mg'$  with prednisone '10mg'$  in mid 07/2021, tolerating very well. -his PSA initially responded well, coming down to 0.5 on 10/26/21. Unfortunately, his PSA rose to 19.3 on 01/19/22 and 99.6 on 02/13/22. -PSMA PET scan on 03/23/22 confirmed widespread intensely radiotracer-avid skeletal metastasis involving axillary and appendicular skeleton; no abnormal activity in prostate gland or lymph nodes. -his Zytiga was switched to chemo doxetaxel on 03/31/22, plan for up to 10 cycles. He is overall tolerating well, PSA is trending down

## 2022-06-30 NOTE — Assessment & Plan Note (Signed)
-  he received palliative RT to T10-SI joints under Dr. Tammi Klippel 07/21/21 - 08/03/21 -Zometa started on 07/06/21, will continue every 3 months.  -s/p kyphoplasty on 10/18/21. Path showed necrosis, malignancy was not excluded. -continue gabapentin and celebrex, in addition to calcium and vit D.

## 2022-07-01 LAB — PROSTATE-SPECIFIC AG, SERUM (LABCORP): Prostate Specific Ag, Serum: 736 ng/mL — ABNORMAL HIGH (ref 0.0–4.0)

## 2022-07-03 ENCOUNTER — Inpatient Hospital Stay: Payer: PPO

## 2022-07-03 ENCOUNTER — Other Ambulatory Visit: Payer: Self-pay

## 2022-07-03 VITALS — BP 137/76 | HR 86 | Temp 98.5°F | Resp 18

## 2022-07-03 DIAGNOSIS — Z5111 Encounter for antineoplastic chemotherapy: Secondary | ICD-10-CM | POA: Diagnosis not present

## 2022-07-03 DIAGNOSIS — C61 Malignant neoplasm of prostate: Secondary | ICD-10-CM

## 2022-07-03 MED ORDER — PEGFILGRASTIM-CBQV 6 MG/0.6ML ~~LOC~~ SOSY
6.0000 mg | PREFILLED_SYRINGE | Freq: Once | SUBCUTANEOUS | Status: AC
  Administered 2022-07-03: 6 mg via SUBCUTANEOUS
  Filled 2022-07-03: qty 0.6

## 2022-07-03 NOTE — Patient Instructions (Signed)

## 2022-07-04 DIAGNOSIS — M6281 Muscle weakness (generalized): Secondary | ICD-10-CM | POA: Diagnosis not present

## 2022-07-04 DIAGNOSIS — M6258 Muscle wasting and atrophy, not elsewhere classified, other site: Secondary | ICD-10-CM | POA: Diagnosis not present

## 2022-07-20 MED FILL — Dexamethasone Sodium Phosphate Inj 100 MG/10ML: INTRAMUSCULAR | Qty: 1 | Status: AC

## 2022-07-21 ENCOUNTER — Encounter: Payer: Self-pay | Admitting: Hematology

## 2022-07-21 ENCOUNTER — Inpatient Hospital Stay (HOSPITAL_BASED_OUTPATIENT_CLINIC_OR_DEPARTMENT_OTHER): Payer: PPO | Admitting: Hematology

## 2022-07-21 ENCOUNTER — Inpatient Hospital Stay: Payer: PPO

## 2022-07-21 VITALS — BP 140/77 | HR 87 | Temp 98.7°F | Resp 17 | Wt 177.6 lb

## 2022-07-21 VITALS — BP 145/74 | HR 78 | Temp 98.4°F | Resp 18

## 2022-07-21 DIAGNOSIS — C7951 Secondary malignant neoplasm of bone: Secondary | ICD-10-CM

## 2022-07-21 DIAGNOSIS — B20 Human immunodeficiency virus [HIV] disease: Secondary | ICD-10-CM

## 2022-07-21 DIAGNOSIS — Z5111 Encounter for antineoplastic chemotherapy: Secondary | ICD-10-CM | POA: Diagnosis not present

## 2022-07-21 DIAGNOSIS — C61 Malignant neoplasm of prostate: Secondary | ICD-10-CM | POA: Diagnosis not present

## 2022-07-21 DIAGNOSIS — M8450XA Pathological fracture in neoplastic disease, unspecified site, initial encounter for fracture: Secondary | ICD-10-CM

## 2022-07-21 LAB — CMP (CANCER CENTER ONLY)
ALT: 13 U/L (ref 0–44)
AST: 32 U/L (ref 15–41)
Albumin: 4.4 g/dL (ref 3.5–5.0)
Alkaline Phosphatase: 125 U/L (ref 38–126)
Anion gap: 8 (ref 5–15)
BUN: 22 mg/dL (ref 8–23)
CO2: 25 mmol/L (ref 22–32)
Calcium: 9.6 mg/dL (ref 8.9–10.3)
Chloride: 102 mmol/L (ref 98–111)
Creatinine: 0.7 mg/dL (ref 0.61–1.24)
GFR, Estimated: >60 mL/min (ref >60)
Glucose, Bld: 99 mg/dL (ref 70–99)
Potassium: 4.9 mmol/L (ref 3.5–5.1)
Sodium: 135 mmol/L (ref 135–145)
Total Bilirubin: 0.5 mg/dL (ref 0.3–1.2)
Total Protein: 6.8 g/dL (ref 6.5–8.1)

## 2022-07-21 LAB — CBC WITH DIFFERENTIAL (CANCER CENTER ONLY)
Abs Immature Granulocytes: 0.22 10*3/uL — ABNORMAL HIGH (ref 0.00–0.07)
Basophils Absolute: 0 10*3/uL (ref 0.0–0.1)
Basophils Relative: 0 %
Eosinophils Absolute: 0 10*3/uL (ref 0.0–0.5)
Eosinophils Relative: 0 %
HCT: 29.7 % — ABNORMAL LOW (ref 39.0–52.0)
Hemoglobin: 10 g/dL — ABNORMAL LOW (ref 13.0–17.0)
Immature Granulocytes: 3 %
Lymphocytes Relative: 10 %
Lymphs Abs: 0.7 10*3/uL (ref 0.7–4.0)
MCH: 33.2 pg (ref 26.0–34.0)
MCHC: 33.7 g/dL (ref 30.0–36.0)
MCV: 98.7 fL (ref 80.0–100.0)
Monocytes Absolute: 0.5 10*3/uL (ref 0.1–1.0)
Monocytes Relative: 7 %
Neutro Abs: 6.2 10*3/uL (ref 1.7–7.7)
Neutrophils Relative %: 80 %
Platelet Count: 136 10*3/uL — ABNORMAL LOW (ref 150–400)
RBC: 3.01 MIL/uL — ABNORMAL LOW (ref 4.22–5.81)
RDW: 18.3 % — ABNORMAL HIGH (ref 11.5–15.5)
WBC Count: 7.7 10*3/uL (ref 4.0–10.5)
nRBC: 0 % (ref 0.0–0.2)

## 2022-07-21 MED ORDER — SODIUM CHLORIDE 0.9 % IV SOLN
10.0000 mg | Freq: Once | INTRAVENOUS | Status: AC
  Administered 2022-07-21: 10 mg via INTRAVENOUS
  Filled 2022-07-21: qty 10

## 2022-07-21 MED ORDER — SODIUM CHLORIDE 0.9 % IV SOLN
75.0000 mg/m2 | Freq: Once | INTRAVENOUS | Status: AC
  Administered 2022-07-21: 150 mg via INTRAVENOUS
  Filled 2022-07-21: qty 15

## 2022-07-21 MED ORDER — SODIUM CHLORIDE 0.9 % IV SOLN: Freq: Once | INTRAVENOUS | Status: AC

## 2022-07-21 NOTE — Assessment & Plan Note (Signed)
-  he received palliative RT to T10-SI joints under Dr. Tammi Klippel 07/21/21 - 08/03/21 -Zometa started on 07/06/21, will continue every 3 months.  -s/p kyphoplasty on 10/18/21. Path showed necrosis, malignancy was not excluded. -continue gabapentin and celebrex, in addition to calcium and vit D.

## 2022-07-21 NOTE — Assessment & Plan Note (Signed)
-  continue meds and f/u with ID

## 2022-07-21 NOTE — Progress Notes (Signed)
[place]   Telephone:(336) [phone] Fax:(336) [phone]   Clinic Follow up Note   Patient Care Team: [name], MD as PCP - General [name] [name], MD as PCP - Infectious Diseases (Infectious Diseases) Katheren Puller, RN as Oncology Nurse Navigator  Date of Service:  07/21/2022  CHIEF COMPLAINT: f/u of  metastatic prostate cancer   CURRENT THERAPY:  -Docetaxel, q21d, starting 03/31/22 -Lupron, started 07/06/21, now q5month -Zometa, q39month starting 07/06/21   ASSESSMENT:  Patrick [name] is a [age].o. male with   Prostate cancer metastatic to bone ([facility] [place]-diagnosed in 06/2021 -initial PSA>3000 --he started casodex on 1/10, and Lupron and Zometa on 07/06/21 in the hospital, and Zytiga '1000mg'$  with prednisone '10mg'$  in mid 07/2021, tolerating very well. -his PSA initially responded well, coming down to 0.5 on 10/26/21. Unfortunately, his PSA rose to 19.3 on 01/19/22 and 99.6 on 02/13/22. -PSMA PET scan on 03/23/22 confirmed widespread intensely radiotracer-avid skeletal metastasis involving axillary and appendicular skeleton; no abnormal activity in prostate gland or lymph nodes. -his Zytiga was switched to chemo doxetaxel on 03/31/22, plan for up to 10 cycles. 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 MaTammi Klippel/26/23 - 08/03/21 -Zometa started on 07/06/21, will continue every 3 months.  -s/p kyphoplasty on 10/18/21. Path showed necrosis, malignancy was not excluded. -continue gabapentin and celebrex, in addition to calcium and vit D.     PLAN: - can increase gabapentin from '300mg'$  to 400 mg if needed for pain - PSA level is  elevated will check again with next labs - next zometa infusion in 3 weeks  - proceed with C6 Docetaxel at full dose '75mg'$ /m2 and continue every 3 weeks -f/u in 3 weeks   SUMMARY OF ONCOLOGIC HISTORY: Oncology History Overview Note   Cancer Staging  [facility]: Prostate, AJCC 8th Edition - Clinical stage from 07/01/2021: Stage IVB (cTX, cNX, pM1b) - Signed by FeTruitt MerleMD on 07/05/2021    [facility] 06/[facility] Tumor Marker   Patient's tumor was tested for the following markers: PSA. Results of the tumor marker test revealed >3,000.   07/01/2021 Cancer Staging   Staging form: Prostate, AJCC 8th Edition - Clinical stage from 07/01/2021: Stage IVB (cTX, cNX, pM1b) - Signed by FeTruitt MerleMD on 07/05/2021   07/01/2021 Imaging   EXAM: CT CHEST, ABDOMEN, AND PELVIS WITH CONTRAST  IMPRESSION: Widespread osseous metastatic disease of unknown primary. Numerous pathologic fractures involving the ribs bilaterally as well as the T11, T12, L2, L3, and L4 vertebral bodies. Retropulsion of T11 and L3 results in severe central canal stenosis at L3. Multifactorial severe central canal stenosis also noted at L4, in part related to metastatic disease. Prominent soft tissue component involving the metastasis involving the spinous process of L5 may provide an appropriate target for tissue sampling for further evaluation.   Mild coronary artery calcification.   Thoracic aortic aneurysm with maximal transaxial dimension of 4.9 cm. Ascending thoracic aortic aneurysm. Recommend semi-annual imaging followup by CTA or MRA and referral to cardiothoracic surgery if not already obtained. This recommendation follows 2010 ACCF/AHA/AATS/ACR/ASA/SCA/SCAI/SIR/STS/SVM  Guidelines for the Diagnosis and Management of Patients With Thoracic Aortic Disease. Circulation. 2010; 121: T517-O160. Aortic aneurysm NOS (ICD10-I71.9)   Moderate enlargement of the  prostate gland. Correlation with serum PSA may be helpful.   07/01/2021 Imaging   EXAM: MRI HEAD WITHOUT CONTRAST  IMPRESSION: 1. No evidence of intracranial metastases on this noncontrast study. 2. Diffusely abnormal bone marrow signal consistent with known widespread osseous metastases.   07/01/2021 Pathology Results   FINAL MICROSCOPIC DIAGNOSIS:   A. SOFT TISSUE MASS, L5, NEEDLE CORE BIOPSY:  -  Metastatic carcinoma  -  See comment   COMMENT:  By immunohistochemistry, the neoplastic cells are positive for PSA and prostein but negative for cytokeratin 7, cytokeratin 20, CDX2 and TTF-1. Overall, the immunoprofile is consistent with a prostatic primary.   07/02/2021 Initial Diagnosis   Prostate cancer metastatic to bone ([place])   07/21/2021 - 08/03/2021 Radiation Therapy   The painful site of metastasis at L5 was treated to 30 Gy in 10 fractions of 3 Gy each.   08/03/2021 Tumor Marker   Patient's tumor was tested for the following markers: PSA. Results of the tumor marker test revealed 76.8.   03/31/2022 -  Chemotherapy   Patient is on Treatment Plan : PROSTATE Docetaxel (75) + Prednisone q21d     04/30/2022 Genetic Testing   Negative genetic testing on the CancerNext-Expanded+RNAinsight panel.  The report date is April 30, 2022.  The CancerNext-Expanded gene panel offered by [facility] and includes sequencing and rearrangement analysis for the following 77 genes: AIP, ALK, APC*, ATM*, AXIN2, BAP1, BARD1, BLM, BMPR1A, BRCA1*, BRCA2*, BRIP1*, CDC73, CDH1*, CDK4, CDKN1B, CDKN2A, CHEK2*, CTNNA1, DICER1, FANCC, FH, FLCN, GALNT12, KIF1B, LZTR1, MAX, MEN1, MET, MLH1*, MSH2*, MSH3, MSH6*, MUTYH*, NBN, NF1*, NF2, NTHL1, PALB2*, PHOX2B, PMS2*, POT1, PRKAR1A, PTCH1, PTEN*, RAD51C*, RAD51D*, RB1, RECQL, RET, SDHA, SDHAF2, SDHB, SDHC, SDHD, SMAD4, SMARCA4, SMARCB1, SMARCE1, STK11, SUFU, TMEM127, TP53*, TSC1, TSC2, VHL and XRCC2 (sequencing and deletion/duplication); EGFR, EGLN1, HOXB13, KIT, MITF,  PDGFRA, POLD1, and POLE (sequencing only); EPCAM and GREM1 (deletion/duplication only). DNA and RNA analyses performed for * genes.       INTERVAL HISTORY:  Patrick [name] is here for a follow up of   metastatic prostate cancer He was last seen by Dr. Burr Medico on 06/30/2022 He presents to the clinic alone, Pt having pain in hips after treatment, pain is a 3-4 at this point, pt is taking tylenol and gabapentin,       All other systems were reviewed with the patient and are negative.  MEDICAL HISTORY:  Past Medical History:  Diagnosis Date   Allergy    seasonal   Arthritis    r knee   H/O inguinal hernia repair    History of tobacco use    HIV infection ([place])    Hyperlipidemia    Hypertension    Strabismic amblyopia     SURGICAL HISTORY: Past Surgical History:  Procedure Laterality Date   EYE SURGERY     strabismus   HERNIA REPAIR     IR BONE TUMOR(S)RF ABLATION  10/18/2021   IR BONE TUMOR(S)RF ABLATION  10/18/2021   IR KYPHO EA ADDL LEVEL THORACIC OR LUMBAR  10/18/2021   IR KYPHO LUMBAR INC FX REDUCE BONE BX UNI/BIL CANNULATION INC/IMAGING  10/18/2021    I have reviewed the social history and family history with the patient and they are unchanged from previous note.  ALLERGIES:  has No Known Allergies.  MEDICATIONS:  Current Outpatient Medications  Medication Sig Dispense Refill   albuterol (VENTOLIN HFA) 108 (90 Base) MCG/ACT inhaler Inhale 2 puffs into the lungs every 6 (six) hours as needed for shortness of breath or wheezing.     amLODipine (NORVASC) 5 MG tablet Take 1 tablet (5 mg total) by mouth daily. 30 tablet 11   atorvastatin (LIPITOR) 40 MG tablet TAKE 1 TABLET(40 MG) BY MOUTH DAILY 90 tablet 2   calcium citrate-vitamin D (CITRACAL+D) 315-200 MG-UNIT tablet Take 1 tablet by mouth daily.     celecoxib (CELEBREX) 200 MG capsule Take 1 capsule (200 mg total) by mouth 2 (two) times daily. 30 capsule 1   dexamethasone (DECADRON) 4 MG tablet Take 1 tablet by mouth 2  times daily starting day before chemo. Then take 1 tablet daily for 2 days starting day after chemo. Take with food. 30 tablet 1   emtricitabine-rilpivir-tenofovir AF (ODEFSEY) 200-25-25 MG TABS tablet Take 1 tablet by mouth daily. 90 tablet 3   gabapentin (NEURONTIN) 300 MG capsule Take 1 capsule (300 mg total) by mouth 3 (three) times daily. 90 capsule 1   lidocaine (LIDODERM) 5 % Place 1 patch onto the skin daily. Remove & Discard patch within 12 hours or as directed by MD 30 patch 0   lidocaine-prilocaine (EMLA) cream Apply to affected area once 30 g 3   Multiple Vitamins-Minerals (MULTIVITAMIN WITH MINERALS) tablet Take 1 tablet by mouth daily.     ondansetron (ZOFRAN) 8 MG tablet Take 1 tablet (8 mg total) by mouth every 8 (eight) hours as needed for nausea or vomiting. 30 tablet 1   predniSONE (DELTASONE) 5 MG tablet Take 1 tablet (5 mg total) by mouth in the morning and at bedtime. 60 tablet 4   prochlorperazine (COMPAZINE) 10 MG tablet Take 1 tablet (10 mg total) by mouth every 6 (six) hours as needed for nausea or vomiting. 30 tablet 1   No current facility-administered medications for this visit.    PHYSICAL EXAMINATION: ECOG PERFORMANCE STATUS: 2 - Symptomatic, <50% confined to bed  Vitals:   07/21/22 1012  BP: (!) 140/77  Pulse: 87  Resp: 17  Temp: 98.7 F (37.1 C)  SpO2: 100%   Wt Readings from Last 3 Encounters:  07/21/22 177 lb 9.6 oz (80.6 kg)  06/30/22 178 lb 9.6 oz (81 kg)  06/02/22 167 lb 9.6 oz (76 kg)     GENERAL:alert, no distress and comfortable SKIN: skin color, texture, turgor are normal, no rashes or significant lesions EYES: normal, Conjunctiva are pink and non-injected, sclera clear NECK: supple, thyroid normal size, non-tender, without nodularity LYMPH:  no palpable lymphadenopathy in the cervical, axillary  LUNGS: clear to auscultation and percussion with normal breathing effort HEART: regular rate & rhythm and no murmurs and no lower extremity  edema ABDOMEN:abdomen soft, non-tender and normal bowel sounds Musculoskeletal:no cyanosis of digits and no clubbing  NEURO: alert & oriented x 3 with fluent speech, no focal motor/sensory deficits  LABORATORY DATA:  I have reviewed the data as listed    Latest Ref Rng & Units 07/21/2022    9:51 AM 06/30/2022   10:11 AM 06/02/2022    9:29 AM  CBC  WBC 4.0 - 10.5 K/uL 7.7  4.7  9.5   Hemoglobin 13.0 - 17.0 g/dL 10.0  9.8  9.4   Hematocrit 39.0 - 52.0 % 29.7  29.0  28.7   Platelets 150 - 400 K/uL 136  169  156         Latest Ref  Rng & Units 07/21/2022    9:51 AM 06/30/2022   10:11 AM 06/02/2022    9:29 AM  CMP  Glucose 70 - 99 mg/dL 99  129  124   BUN 8 - 23 mg/dL '22  16  15   '$ Creatinine 0.61 - 1.24 mg/dL 0.70  0.61  0.55   Sodium 135 - 145 mmol/L 135  136  135   Potassium 3.5 - 5.1 mmol/L 4.9  4.7  4.5   Chloride 98 - 111 mmol/L 102  103  103   CO2 22 - 32 mmol/L '25  23  25   '$ Calcium 8.9 - 10.3 mg/dL 9.6  9.3  8.9   Total Protein 6.5 - 8.1 g/dL 6.8  6.2  6.4   Total Bilirubin 0.3 - 1.2 mg/dL 0.5  0.5  0.4   Alkaline Phos 38 - 126 U/L 125  161  235   AST 15 - 41 U/L 32  22  19   ALT 0 - 44 U/L '13  13  16       '$ RADIOGRAPHIC STUDIES: I have personally reviewed the radiological images as listed and agreed with the findings in the report. No results found.    Orders Placed This Encounter  Procedures   CBC with Differential ([place] Only)    Standing Status:   Future    Standing Expiration Date:   09/02/2023   CMP ([place] only)    Standing Status:   Future    Standing Expiration Date:   09/02/2023   All questions were answered. The patient knows to call the clinic with any problems, questions or concerns. No barriers to learning was detected. The total time spent in the appointment was 30 minutes.     Truitt Merle, MD 07/21/2022   Felicity Coyer, CMA, am acting as scribe for Truitt Merle, MD.   I have reviewed the above documentation for accuracy and  completeness, and I agree with the above.

## 2022-07-21 NOTE — Patient Instructions (Signed)
[facility]  Discharge Instructions: Thank you for choosing [facility] to provide your oncology and hematology care.   If you have a lab appointment with the [facility], please go directly to the [facility] and check in at the registration area.   Wear comfortable clothing and clothing appropriate for easy access to any Portacath or PICC line.   We strive to give you quality time with your provider. You may need to reschedule your appointment if you arrive late (15 or more minutes).  Arriving late affects you and other patients whose appointments are after yours.  Also, if you miss three or more appointments without notifying the office, you may be dismissed from the clinic at the provider's discretion.      For prescription refill requests, have your pharmacy contact our office and allow 72 hours for refills to be completed.    Today you received the following chemotherapy and/or immunotherapy agents: Docetaxel      To help prevent nausea and vomiting after your treatment, we encourage you to take your nausea medication as directed.  BELOW ARE SYMPTOMS THAT SHOULD BE REPORTED IMMEDIATELY: *FEVER GREATER THAN 100.4 F (38 C) OR HIGHER *CHILLS OR SWEATING *NAUSEA AND VOMITING THAT IS NOT CONTROLLED WITH YOUR NAUSEA MEDICATION *UNUSUAL SHORTNESS OF BREATH *UNUSUAL BRUISING OR BLEEDING *URINARY PROBLEMS (pain or burning when urinating, or frequent urination) *BOWEL PROBLEMS (unusual diarrhea, constipation, pain near the anus) TENDERNESS IN MOUTH AND THROAT WITH OR WITHOUT PRESENCE OF ULCERS (sore throat, sores in mouth, or a toothache) UNUSUAL RASH, SWELLING OR PAIN  UNUSUAL VAGINAL DISCHARGE OR ITCHING   Items with * indicate a potential emergency and should be followed up as soon as possible or go to the Emergency Department if any problems should occur.  Please show the CHEMOTHERAPY ALERT CARD or IMMUNOTHERAPY ALERT CARD at  check-in to the Emergency Department and triage nurse.  Should you have questions after your visit or need to cancel or reschedule your appointment, please contact [facility]  Dept: [phone]  and follow the prompts.  Office hours are 8:00 a.m. to 4:30 p.m. Monday - Friday. Please note that voicemails left after 4:00 p.m. may not be returned until the following business day.  We are closed weekends and major holidays. You have access to a nurse at all times for urgent questions. Please call the main number to the clinic Dept: [phone] and follow the prompts.   For any non-urgent questions, you may also contact your provider using MyChart. We now offer e-Visits for anyone 71 and older to request care online for non-urgent symptoms. For details visit [email or web address].   Also download the MyChart app! Go to the app store, search "MyChart", open the app, select [facility], and log in with your MyChart username and password.  Masks are optional in the cancer centers. If you would like for your care team to wear a mask while they are taking care of you, please let them know. You may have one support person who is at least 73 years old accompany you for your appointments.

## 2022-07-21 NOTE — Assessment & Plan Note (Signed)
-  diagnosed in 06/2021 -initial PSA>3000 --he started casodex on 1/10, and Lupron and Zometa on 07/06/21 in the hospital, and Zytiga '1000mg'$  with prednisone '10mg'$  in mid 07/2021, tolerating very well. -his PSA initially responded well, coming down to 0.5 on 10/26/21. Unfortunately, his PSA rose to 19.3 on 01/19/22 and 99.6 on 02/13/22. -PSMA PET scan on 03/23/22 confirmed widespread intensely radiotracer-avid skeletal metastasis involving axillary and appendicular skeleton; no abnormal activity in prostate gland or lymph nodes. -his Zytiga was switched to chemo doxetaxel on 03/31/22, plan for up to 10 cycles. He is overall tolerating well, PSA is trending down

## 2022-07-24 ENCOUNTER — Inpatient Hospital Stay: Payer: PPO

## 2022-07-24 ENCOUNTER — Other Ambulatory Visit: Payer: Self-pay

## 2022-07-24 VITALS — BP 106/74 | HR 88 | Temp 98.8°F | Resp 18

## 2022-07-24 DIAGNOSIS — C61 Malignant neoplasm of prostate: Secondary | ICD-10-CM

## 2022-07-24 DIAGNOSIS — Z5111 Encounter for antineoplastic chemotherapy: Secondary | ICD-10-CM | POA: Diagnosis not present

## 2022-07-24 MED ORDER — PEGFILGRASTIM-CBQV 6 MG/0.6ML ~~LOC~~ SOSY
6.0000 mg | PREFILLED_SYRINGE | Freq: Once | SUBCUTANEOUS | Status: AC
  Administered 2022-07-24: 6 mg via SUBCUTANEOUS
  Filled 2022-07-24: qty 0.6

## 2022-07-28 ENCOUNTER — Other Ambulatory Visit: Payer: Self-pay

## 2022-08-04 DIAGNOSIS — M6281 Muscle weakness (generalized): Secondary | ICD-10-CM | POA: Diagnosis not present

## 2022-08-04 DIAGNOSIS — M6258 Muscle wasting and atrophy, not elsewhere classified, other site: Secondary | ICD-10-CM | POA: Diagnosis not present

## 2022-08-09 ENCOUNTER — Other Ambulatory Visit: Payer: Self-pay

## 2022-08-10 MED FILL — Dexamethasone Sodium Phosphate Inj 100 MG/10ML: INTRAMUSCULAR | Qty: 1 | Status: AC

## 2022-08-11 ENCOUNTER — Encounter: Payer: Self-pay | Admitting: Hematology

## 2022-08-11 ENCOUNTER — Inpatient Hospital Stay (HOSPITAL_BASED_OUTPATIENT_CLINIC_OR_DEPARTMENT_OTHER): Payer: PPO | Admitting: Hematology

## 2022-08-11 ENCOUNTER — Inpatient Hospital Stay: Payer: PPO | Attending: Hematology

## 2022-08-11 ENCOUNTER — Inpatient Hospital Stay: Payer: PPO

## 2022-08-11 ENCOUNTER — Other Ambulatory Visit (HOSPITAL_COMMUNITY): Payer: Self-pay

## 2022-08-11 VITALS — BP 126/96 | HR 96 | Temp 98.5°F | Resp 18

## 2022-08-11 VITALS — BP 135/84 | HR 88 | Resp 18

## 2022-08-11 DIAGNOSIS — C7951 Secondary malignant neoplasm of bone: Secondary | ICD-10-CM

## 2022-08-11 DIAGNOSIS — Z5111 Encounter for antineoplastic chemotherapy: Secondary | ICD-10-CM | POA: Diagnosis not present

## 2022-08-11 DIAGNOSIS — B20 Human immunodeficiency virus [HIV] disease: Secondary | ICD-10-CM | POA: Diagnosis not present

## 2022-08-11 DIAGNOSIS — C61 Malignant neoplasm of prostate: Secondary | ICD-10-CM | POA: Insufficient documentation

## 2022-08-11 DIAGNOSIS — Z5189 Encounter for other specified aftercare: Secondary | ICD-10-CM | POA: Insufficient documentation

## 2022-08-11 DIAGNOSIS — G893 Neoplasm related pain (acute) (chronic): Secondary | ICD-10-CM | POA: Diagnosis not present

## 2022-08-11 LAB — CMP (CANCER CENTER ONLY)
ALT: 12 U/L (ref 0–44)
AST: 80 U/L — ABNORMAL HIGH (ref 15–41)
Albumin: 4.7 g/dL (ref 3.5–5.0)
Alkaline Phosphatase: 106 U/L (ref 38–126)
Anion gap: 13 (ref 5–15)
BUN: 22 mg/dL (ref 8–23)
CO2: 22 mmol/L (ref 22–32)
Calcium: 10.2 mg/dL (ref 8.9–10.3)
Chloride: 96 mmol/L — ABNORMAL LOW (ref 98–111)
Creatinine: 0.72 mg/dL (ref 0.61–1.24)
GFR, Estimated: >60 mL/min (ref >60)
Glucose, Bld: 166 mg/dL — ABNORMAL HIGH (ref 70–99)
Potassium: 4.7 mmol/L (ref 3.5–5.1)
Sodium: 131 mmol/L — ABNORMAL LOW (ref 135–145)
Total Bilirubin: 0.5 mg/dL (ref 0.3–1.2)
Total Protein: 6.7 g/dL (ref 6.5–8.1)

## 2022-08-11 LAB — CBC WITH DIFFERENTIAL (CANCER CENTER ONLY)
Abs Immature Granulocytes: 0.48 10*3/uL — ABNORMAL HIGH (ref 0.00–0.07)
Basophils Absolute: 0 10*3/uL (ref 0.0–0.1)
Basophils Relative: 0 %
Eosinophils Absolute: 0.3 10*3/uL (ref 0.0–0.5)
Eosinophils Relative: 2 %
HCT: 26.4 % — ABNORMAL LOW (ref 39.0–52.0)
Hemoglobin: 9 g/dL — ABNORMAL LOW (ref 13.0–17.0)
Immature Granulocytes: 3 %
Lymphocytes Relative: 6 %
Lymphs Abs: 0.9 10*3/uL (ref 0.7–4.0)
MCH: 33 pg (ref 26.0–34.0)
MCHC: 34.1 g/dL (ref 30.0–36.0)
MCV: 96.7 fL (ref 80.0–100.0)
Monocytes Absolute: 1.1 10*3/uL — ABNORMAL HIGH (ref 0.1–1.0)
Monocytes Relative: 7 %
Neutro Abs: 12.6 10*3/uL — ABNORMAL HIGH (ref 1.7–7.7)
Neutrophils Relative %: 82 %
Platelet Count: 82 10*3/uL — ABNORMAL LOW (ref 150–400)
RBC: 2.73 MIL/uL — ABNORMAL LOW (ref 4.22–5.81)
RDW: 17.1 % — ABNORMAL HIGH (ref 11.5–15.5)
Smear Review: NORMAL
WBC Count: 15.4 10*3/uL — ABNORMAL HIGH (ref 4.0–10.5)
nRBC: 0 % (ref 0.0–0.2)

## 2022-08-11 MED ORDER — HEPARIN SOD (PORK) LOCK FLUSH 100 UNIT/ML IV SOLN: 500.0000 [IU] | Freq: Once | INTRAVENOUS | Status: DC | PRN

## 2022-08-11 MED ORDER — SODIUM CHLORIDE 0.9 % IV SOLN
10.0000 mg | Freq: Once | INTRAVENOUS | Status: AC
  Administered 2022-08-11: 10 mg via INTRAVENOUS
  Filled 2022-08-11: qty 10

## 2022-08-11 MED ORDER — SODIUM CHLORIDE 0.9 % IV SOLN: Freq: Once | INTRAVENOUS | Status: DC

## 2022-08-11 MED ORDER — SODIUM CHLORIDE 0.9 % IV SOLN
60.0000 mg/m2 | Freq: Once | INTRAVENOUS | Status: AC
  Administered 2022-08-11: 116 mg via INTRAVENOUS
  Filled 2022-08-11: qty 11.6

## 2022-08-11 MED ORDER — OXYCODONE HCL 5 MG PO TABS
5.0000 mg | ORAL_TABLET | Freq: Four times a day (QID) | ORAL | 0 refills | Status: DC | PRN
  Filled 2022-08-11: qty 60, 15d supply, fill #0

## 2022-08-11 MED ORDER — SODIUM CHLORIDE 0.9 % IV SOLN: Freq: Once | INTRAVENOUS | Status: AC

## 2022-08-11 MED ORDER — SODIUM CHLORIDE 0.9% FLUSH: 10.0000 mL | INTRAVENOUS | Status: DC | PRN

## 2022-08-11 MED ORDER — OXYCODONE HCL 5 MG PO TABS
5.0000 mg | ORAL_TABLET | Freq: Once | ORAL | Status: AC
  Administered 2022-08-11: 5 mg via ORAL
  Filled 2022-08-11: qty 1

## 2022-08-11 MED ORDER — ZOLEDRONIC ACID 4 MG/100ML IV SOLN
4.0000 mg | Freq: Once | INTRAVENOUS | Status: AC
  Administered 2022-08-11: 4 mg via INTRAVENOUS
  Filled 2022-08-11: qty 100

## 2022-08-11 NOTE — Progress Notes (Signed)
Patient seen by MD today  Vitals are not all within treatment parameters. Stable  Labs reviewed: and are not all within treatment parameters. Ok to treat with Platelet count of 82  Per physician team, patient is ready for treatment. Please note that modifications are being made to the treatment plan including Oxycodone to be added for pain

## 2022-08-11 NOTE — Assessment & Plan Note (Addendum)
-  Stage IV with diffuse bone metastasis.  Diagnosed in 06/2021, castration resistant disease. -initial PSA>3000 --he started casodex on 1/10, and Lupron and Zometa on 07/06/21 in the hospital, and Zytiga 1051m with prednisone 140min mid 07/2021 -his PSA initially responded well, coming down to 0.5 on 10/26/21. Unfortunately, his PSA rose to 19.3 on 01/19/22 and 99.6 on 02/13/22. -PSMA PET scan on 03/23/22 confirmed widespread intensely radiotracer-avid skeletal metastasis involving axillary and appendicular skeleton; no abnormal activity in prostate gland or lymph nodes. -his Zytiga was switched to chemo doxetaxel on 03/31/22, plan for up to 10 cycles. He is overall tolerating well, PSA is trending down but trended up slightly last time 3 weeks ago -if his PSA trending further or his pain gets worse, will repeat scan before we switch his treatment  -His testosterone level has been undetectable, will continue to monitor every 3 months.

## 2022-08-11 NOTE — Assessment & Plan Note (Signed)
-  continue meds and f/u with ID

## 2022-08-11 NOTE — Progress Notes (Signed)
[facility]   [phone] Fax:([phone]) [phone]   Clinic Follow up Note   Patient Care Team: [name], MD as PCP - General [name] [name], MD as PCP - Infectious Diseases (Infectious Diseases) Katheren Puller, RN as Oncology Nurse Navigator  Date of Service:  08/11/2022  CHIEF COMPLAINT: f/u of  metastatic prostate cancer     CURRENT THERAPY:   -Docetaxel, q21d, starting 03/31/22 -Lupron, started 07/06/21, now q60month -Zometa, q375month starting 07/06/21  ASSESSMENT:  Patrick [name] is a [age].o. 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  Diagnosed in 06/2021, castration resistant disease. -initial PSA>3000 --he started casodex on 1/10, and Lupron and Zometa on 07/06/21 in the hospital, and Zytiga 100068mith prednisone 70m63m mid 07/2021 -his PSA initially responded well, coming down to 0.5 on 10/26/21. Unfortunately, his PSA rose to 19.3 on 01/19/22 and 99.6 on 02/13/22. -PSMA PET scan on 03/23/22 confirmed widespread intensely radiotracer-avid skeletal metastasis involving axillary and appendicular skeleton; no abnormal activity in prostate gland or lymph nodes. -his Zytiga was switched to chemo doxetaxel on 03/31/22, plan for up to 10 cycles. He is overall tolerating well, PSA is trending down but trended up slightly last time 3 weeks ago -He has developed worsening bone pain, likely related to disease progression.  I will repeat PSMA PET scan as soon as possible, and to change his treatment if scan confirms disease progression. -His testosterone level has been undetectable, will continue to monitor every 3 months.  Human immunodeficiency virus (HIV) disease (HCC)[place] and f/u with ID   Cancer related pain -She reports worsening diffuse bone and joint pain in the past week, likely related to his cancer progression -He is on gabapentin.  I prescribed oxycodone 5 mg every 6 hours as needed  today -I discussed palliative care referral, either at home or in the clinic, he will think about it.   PLAN: -I prescribe Oxycodone -order CT scan 2wks -Lab reviewed-Platelet count 82K, possible related to chemo or disease progression -proceed with C7 Taxotere at reduce dose today  -I ordered PSMA scan to be done ASAP, and phone visit after PET   SUMMARY OF ONCOLOGIC HISTORY: Oncology History Overview Note   Cancer Staging  [facility]: Prostate, AJCC 8th Edition - Clinical stage from 07/01/2021: Stage IVB (cTX, cNX, pM1b) - Signed by FengTruitt Merle on 07/05/2021    [facility]2021 Tumor Marker   Patient's tumor was tested for the following markers: PSA. Results of the tumor marker test revealed >3,000.   07/01/2021 Cancer Staging   Staging form: Prostate, AJCC 8th Edition - Clinical stage from 07/01/2021: Stage IVB (cTX, cNX, pM1b) - Signed by FengTruitt Merle on 07/05/2021   07/01/2021 Imaging   EXAM: CT CHEST, ABDOMEN, AND PELVIS WITH CONTRAST  IMPRESSION: Widespread osseous metastatic disease of unknown primary. Numerous pathologic fractures involving the ribs bilaterally as well as the T11, T12, L2, L3, and L4 vertebral bodies. Retropulsion of T11 and L3 results in severe central canal stenosis at L3. Multifactorial severe central canal stenosis also noted at L4, in part related to metastatic disease. Prominent soft tissue component involving the metastasis involving the spinous process of L5 may provide an appropriate target for tissue sampling for further evaluation.   Mild coronary artery calcification.   Thoracic aortic aneurysm with maximal transaxial dimension of 4.9 cm.  Ascending thoracic aortic aneurysm. Recommend semi-annual imaging followup by CTA or MRA and referral to cardiothoracic surgery if not already obtained. This recommendation follows 2010 ACCF/AHA/AATS/ACR/ASA/SCA/SCAI/SIR/STS/SVM  Guidelines for the Diagnosis and Management of Patients With Thoracic Aortic Disease. Circulation. 2010; [id number]. Aortic aneurysm NOS (ICD10-I71.9)   Moderate enlargement of the prostate gland. Correlation with serum PSA may be helpful.   07/01/2021 Imaging   EXAM: MRI HEAD WITHOUT CONTRAST  IMPRESSION: 1. No evidence of intracranial metastases on this noncontrast study. 2. Diffusely abnormal bone marrow signal consistent with known widespread osseous metastases.   07/01/2021 Pathology Results   FINAL MICROSCOPIC DIAGNOSIS:   A. SOFT TISSUE MASS, L5, NEEDLE CORE BIOPSY:  -  Metastatic carcinoma  -  See comment   COMMENT:  By immunohistochemistry, the neoplastic cells are positive for PSA and prostein but negative for cytokeratin 7, cytokeratin 20, CDX2 and TTF-1. Overall, the immunoprofile is consistent with a prostatic primary.   07/02/2021 Initial Diagnosis   Prostate cancer metastatic to bone ([place])   07/21/2021 - 08/03/2021 Radiation Therapy   The painful site of metastasis at L5 was treated to 30 Gy in 10 fractions of 3 Gy each.   08/03/2021 Tumor Marker   Patient's tumor was tested for the following markers: PSA. Results of the tumor marker test revealed 76.8.   03/31/2022 -  Chemotherapy   Patient is on Treatment Plan : PROSTATE Docetaxel (74) + Prednisone q21d     04/30/2022 Genetic Testing   Negative genetic testing on the CancerNext-Expanded+RNAinsight panel.  The report date is April 30, 2022.  The CancerNext-Expanded gene panel offered by [facility] and includes sequencing and rearrangement analysis for the following 77 genes: AIP, ALK, APC*, ATM*, AXIN2, BAP1, BARD1, BLM, BMPR1A, BRCA1*, BRCA2*, BRIP1*, CDC73, CDH1*, CDK4, CDKN1B, CDKN2A, CHEK2*, CTNNA1, DICER1, FANCC, FH, FLCN, GALNT12, KIF1B, LZTR1, MAX, MEN1, MET, MLH1*, MSH2*, MSH3, MSH6*, MUTYH*, NBN, NF1*, NF2, NTHL1, PALB2*, PHOX2B, PMS2*, POT1, PRKAR1A, PTCH1, PTEN*, RAD51C*, RAD51D*, RB1, RECQL, RET, SDHA,  SDHAF2, SDHB, SDHC, SDHD, SMAD4, SMARCA4, SMARCB1, SMARCE1, STK11, SUFU, TMEM127, TP53*, TSC1, TSC2, VHL and XRCC2 (sequencing and deletion/duplication); EGFR, EGLN1, HOXB13, KIT, MITF, PDGFRA, POLD1, and POLE (sequencing only); EPCAM and GREM1 (deletion/duplication only). DNA and RNA analyses performed for * genes.       INTERVAL HISTORY:  JAMARIAN NORONA III is here for a follow up of   metastatic prostate cancer   He was last seen by me on 07/21/2022 He presents to the clinic alone. Pt reports of having pain his legs and in his back and he's having tightness in his chest. Pt states he just have pain all over his body. Pt rated his pain level at 6. Pt stated the pain wakes him up out his sleep. Pt stated the pain started Saturday. Pt stated the Gabapentin and tylenol wasn't helping. Pt denies having poor appetite. Pt denies having nausea.      All other systems were reviewed with the patient and are negative.  MEDICAL HISTORY:  Past Medical History:  Diagnosis Date   Allergy    seasonal   Arthritis    r knee   H/O inguinal hernia repair    History of tobacco use    HIV infection ([place])    Hyperlipidemia    Hypertension    Strabismic amblyopia     SURGICAL HISTORY: Past Surgical History:  Procedure Laterality Date   EYE SURGERY     strabismus   HERNIA REPAIR     IR BONE  TUMOR(S)RF ABLATION  10/18/2021   IR BONE TUMOR(S)RF ABLATION  10/18/2021   IR KYPHO EA ADDL LEVEL THORACIC OR LUMBAR  10/18/2021   IR KYPHO LUMBAR INC FX REDUCE BONE BX UNI/BIL CANNULATION INC/IMAGING  10/18/2021    I have reviewed the social history and family history with the patient and they are unchanged from previous note.  ALLERGIES:  has No Known Allergies.  MEDICATIONS:  Current Outpatient Medications  Medication Sig Dispense Refill   oxyCODONE (OXY IR/ROXICODONE) 5 MG immediate release tablet Take 1 tablet (5 mg total) by mouth every 6 (six) hours as needed for severe pain. 60 tablet 0   albuterol  (VENTOLIN HFA) 108 (90 Base) MCG/ACT inhaler Inhale 2 puffs into the lungs every 6 (six) hours as needed for shortness of breath or wheezing.     amLODipine (NORVASC) 5 MG tablet Take 1 tablet (5 mg total) by mouth daily. 30 tablet 11   atorvastatin (LIPITOR) 40 MG tablet TAKE 1 TABLET(40 MG) BY MOUTH DAILY 90 tablet 2   calcium citrate-vitamin D (CITRACAL+D) 315-200 MG-UNIT tablet Take 1 tablet by mouth daily.     celecoxib (CELEBREX) 200 MG capsule Take 1 capsule (200 mg total) by mouth 2 (two) times daily. 30 capsule 1   dexamethasone (DECADRON) 4 MG tablet Take 1 tablet by mouth 2 times daily starting day before chemo. Then take 1 tablet daily for 2 days starting day after chemo. Take with food. 30 tablet 1   emtricitabine-rilpivir-tenofovir AF (ODEFSEY) 200-25-25 MG TABS tablet Take 1 tablet by mouth daily. 90 tablet 3   gabapentin (NEURONTIN) 300 MG capsule Take 1 capsule (300 mg total) by mouth 3 (three) times daily. 90 capsule 1   lidocaine (LIDODERM) 5 % Place 1 patch onto the skin daily. Remove & Discard patch within 12 hours or as directed by MD 30 patch 0   lidocaine-prilocaine (EMLA) cream Apply to affected area once 30 g 3   Multiple Vitamins-Minerals (MULTIVITAMIN WITH MINERALS) tablet Take 1 tablet by mouth daily.     ondansetron (ZOFRAN) 8 MG tablet Take 1 tablet (8 mg total) by mouth every 8 (eight) hours as needed for nausea or vomiting. 30 tablet 1   predniSONE (DELTASONE) 5 MG tablet Take 1 tablet (5 mg total) by mouth in the morning and at bedtime. 60 tablet 4   prochlorperazine (COMPAZINE) 10 MG tablet Take 1 tablet (10 mg total) by mouth every 6 (six) hours as needed for nausea or vomiting. 30 tablet 1   No current facility-administered medications for this visit.   Facility-Administered Medications Ordered in Other Visits  Medication Dose Route Frequency Provider Last Rate Last Admin   0.9 %  sodium chloride infusion   Intravenous Once Truitt Merle, MD       heparin lock  flush 100 unit/mL  500 Units Intracatheter Once PRN Truitt Merle, MD       sodium chloride flush (NS) 0.9 % injection 10 mL  10 mL Intracatheter PRN Truitt Merle, MD        PHYSICAL EXAMINATION: ECOG PERFORMANCE STATUS: 3 - Symptomatic, >50% confined to bed  Vitals:   08/11/22 1408  BP: (!) 126/96  Pulse: 96  Resp: 18  Temp: 98.5 F (36.9 C)  SpO2: 99%   Wt Readings from Last 3 Encounters:  07/21/22 177 lb 9.6 oz (80.6 kg)  06/30/22 178 lb 9.6 oz (81 kg)  06/02/22 167 lb 9.6 oz (76 kg)     LUNGS: (-) clear to auscultation  and percussion with normal breathing effort HEART: (-) regular rate & rhythm and no murmurs and no lower extremity edema ABDOMEN:abdomen soft, non-tender and normal bowel sounds Musculoskeletal:no cyanosis of digits and no clubbing  NEURO: alert & oriented x 3 with fluent speech, no focal motor/sensory deficits  LABORATORY DATA:  I have reviewed the data as listed    Latest Ref Rng & Units 08/11/2022    1:44 PM 07/21/2022    9:51 AM 06/30/2022   10:11 AM  CBC  WBC 4.0 - 10.5 K/uL 15.4  7.7  4.7   Hemoglobin 13.0 - 17.0 g/dL 9.0  10.0  9.8   Hematocrit 39.0 - 52.0 % 26.4  29.7  29.0   Platelets 150 - 400 K/uL 82  136  169         Latest Ref Rng & Units 08/11/2022    1:44 PM 07/21/2022    9:51 AM 06/30/2022   10:11 AM  CMP  Glucose 70 - 99 mg/dL 166  99  129   BUN 8 - 23 mg/dL 22  22  16   $ Creatinine 0.61 - 1.24 mg/dL 0.72  0.70  0.61   Sodium 135 - 145 mmol/L 131  135  136   Potassium 3.5 - 5.1 mmol/L 4.7  4.9  4.7   Chloride 98 - 111 mmol/L 96  102  103   CO2 22 - 32 mmol/L 22  25  23   $ Calcium 8.9 - 10.3 mg/dL 10.2  9.6  9.3   Total Protein 6.5 - 8.1 g/dL 6.7  6.8  6.2   Total Bilirubin 0.3 - 1.2 mg/dL 0.5  0.5  0.5   Alkaline Phos 38 - 126 U/L 106  125  161   AST 15 - 41 U/L 80  32  22   ALT 0 - 44 U/L 12  13  13       $ RADIOGRAPHIC STUDIES: I have personally reviewed the radiological images as listed and agreed with the findings in the  report. No results found.    Orders Placed This Encounter  Procedures   NM PET (PSMA) SKULL TO MID THIGH    Standing Status:   Future    Standing Expiration Date:   08/12/2023    Order Specific Question:   If indicated for the ordered procedure, I authorize the administration of a radiopharmaceutical per Radiology protocol    Answer:   Yes    Order Specific Question:   Preferred imaging location?    Answer:   [facility]   All questions were answered. The patient knows to call the clinic with any problems, questions or concerns. No barriers to learning was detected. The total time spent in the appointment was 30 minutes.     Truitt Merle, MD 08/11/2022   Felicity Coyer, CMA, am acting as scribe for Truitt Merle, MD.   I have reviewed the above documentation for accuracy and completeness, and I agree with the above.

## 2022-08-11 NOTE — Patient Instructions (Signed)
[facility]  Discharge Instructions: Thank you for choosing [facility] to provide your oncology and hematology care.   If you have a lab appointment with the Cancer Center, please go directly to the Cancer Center and check in at the registration area.   Wear comfortable clothing and clothing appropriate for easy access to any Portacath or PICC line.   We strive to give you quality time with your provider. You may need to reschedule your appointment if you arrive late (15 or more minutes).  Arriving late affects you and other patients whose appointments are after yours.  Also, if you miss three or more appointments without notifying the office, you may be dismissed from the clinic at the provider's discretion.      For prescription refill requests, have your pharmacy contact our office and allow 72 hours for refills to be completed.    Today you received the following chemotherapy and/or immunotherapy agents :  Docetaxel      To help prevent nausea and vomiting after your treatment, we encourage you to take your nausea medication as directed.  BELOW ARE SYMPTOMS THAT SHOULD BE REPORTED IMMEDIATELY: *FEVER GREATER THAN 100.4 F (38 C) OR HIGHER *CHILLS OR SWEATING *NAUSEA AND VOMITING THAT IS NOT CONTROLLED WITH YOUR NAUSEA MEDICATION *UNUSUAL SHORTNESS OF BREATH *UNUSUAL BRUISING OR BLEEDING *URINARY PROBLEMS (pain or burning when urinating, or frequent urination) *BOWEL PROBLEMS (unusual diarrhea, constipation, pain near the anus) TENDERNESS IN MOUTH AND THROAT WITH OR WITHOUT PRESENCE OF ULCERS (sore throat, sores in mouth, or a toothache) UNUSUAL RASH, SWELLING OR PAIN  UNUSUAL VAGINAL DISCHARGE OR ITCHING   Items with * indicate a potential emergency and should be followed up as soon as possible or go to the Emergency Department if any problems should occur.  Please show the CHEMOTHERAPY ALERT CARD or IMMUNOTHERAPY ALERT CARD at  check-in to the Emergency Department and triage nurse.  Should you have questions after your visit or need to cancel or reschedule your appointment, please contact [facility]  Dept: [phone]  and follow the prompts.  Office hours are 8:00 a.m. to 4:30 p.m. Monday - Friday. Please note that voicemails left after 4:00 p.m. may not be returned until the following business day.  We are closed weekends and major holidays. You have access to a nurse at all times for urgent questions. Please call the main number to the clinic Dept: [phone] and follow the prompts.   For any non-urgent questions, you may also contact your provider using MyChart. We now offer e-Visits for anyone 18 and older to request care online for non-urgent symptoms. For details visit [email or web address].   Also download the MyChart app! Go to the app store, search "MyChart", open the app, select [facility], and log in with your MyChart username and password. 

## 2022-08-13 LAB — PROSTATE-SPECIFIC AG, SERUM (LABCORP): Prostate Specific Ag, Serum: 5850 ng/mL — ABNORMAL HIGH (ref 0.0–4.0)

## 2022-08-14 ENCOUNTER — Inpatient Hospital Stay: Payer: PPO

## 2022-08-14 ENCOUNTER — Encounter: Payer: Self-pay | Admitting: Hematology

## 2022-08-14 ENCOUNTER — Other Ambulatory Visit (HOSPITAL_COMMUNITY): Payer: Self-pay

## 2022-08-14 VITALS — BP 111/62 | HR 101 | Temp 98.8°F | Resp 18

## 2022-08-14 DIAGNOSIS — C61 Malignant neoplasm of prostate: Secondary | ICD-10-CM

## 2022-08-14 DIAGNOSIS — Z5111 Encounter for antineoplastic chemotherapy: Secondary | ICD-10-CM | POA: Diagnosis not present

## 2022-08-14 MED ORDER — PEGFILGRASTIM-CBQV 6 MG/0.6ML ~~LOC~~ SOSY
6.0000 mg | PREFILLED_SYRINGE | Freq: Once | SUBCUTANEOUS | Status: AC
  Administered 2022-08-14: 6 mg via SUBCUTANEOUS
  Filled 2022-08-14: qty 0.6

## 2022-08-14 NOTE — Addendum Note (Signed)
Addended by: Truitt Merle on: 08/14/2022 03:18 PM   Modules accepted: Orders

## 2022-08-14 NOTE — Progress Notes (Signed)
DISCONTINUE ON PATHWAY REGIMEN - Prostate     A cycle is every 21 days:     Prednisone      Docetaxel   **Always confirm dose/schedule in your pharmacy ordering system**  REASON: Disease Progression PRIOR TREATMENT: POS37: Docetaxel 75 mg/m2 q21 Days + Prednisone 5 mg BID Until Progression or Toxicity TREATMENT RESPONSE: Partial Response (PR)  START ON PATHWAY REGIMEN - Prostate     Cycles 1 through up to 6: A cycle is every 42 days:     Lutetium Lu 177 vipivotide tetraxetan   **Always confirm dose/schedule in your pharmacy ordering system**  Patient Characteristics: Adenocarcinoma, Recurrent/New Systemic Disease (Including Biochemical Recurrence), Castration Resistant, M1, Prior Novel Hormonal Agent, No Molecular Alteration or Targeted Therapy Exhausted, Prior Taxane and PSMA-Positive Histology: Adenocarcinoma Therapeutic Status: Recurrent/New Systemic Disease (Including Biochemical Recurrence)  Intent of Therapy: Non-Curative / Palliative Intent, Discussed with Patient

## 2022-08-14 NOTE — Patient Instructions (Signed)

## 2022-08-19 LAB — TESTOSTERONE, FREE, TOTAL, SHBG
Sex Hormone Binding: 55 nmol/L (ref 19.3–76.4)
Testosterone, Free: <0.2 pg/mL — ABNORMAL LOW (ref 6.6–18.1)
Testosterone: <3 ng/dL — ABNORMAL LOW (ref 264–916)

## 2022-08-23 ENCOUNTER — Other Ambulatory Visit: Payer: Self-pay | Admitting: Hematology

## 2022-08-23 DIAGNOSIS — C61 Malignant neoplasm of prostate: Secondary | ICD-10-CM

## 2022-08-24 ENCOUNTER — Other Ambulatory Visit: Payer: Self-pay

## 2022-08-25 ENCOUNTER — Other Ambulatory Visit (HOSPITAL_COMMUNITY): Payer: Self-pay | Admitting: Hematology

## 2022-08-25 ENCOUNTER — Encounter (HOSPITAL_COMMUNITY)
Admission: RE | Admit: 2022-08-25 | Discharge: 2022-08-25 | Disposition: A | Discharge status: Home / Self Care | Payer: PPO | Source: Ambulatory Visit | Attending: Hematology | Admitting: Hematology

## 2022-08-25 ENCOUNTER — Ambulatory Visit (HOSPITAL_COMMUNITY)
Admission: RE | Admit: 2022-08-25 | Discharge: 2022-08-25 | Disposition: A | Discharge status: Home / Self Care | Payer: PPO | Source: Ambulatory Visit | Attending: Hematology | Admitting: Hematology

## 2022-08-25 DIAGNOSIS — C61 Malignant neoplasm of prostate: Secondary | ICD-10-CM | POA: Insufficient documentation

## 2022-08-25 DIAGNOSIS — C7951 Secondary malignant neoplasm of bone: Secondary | ICD-10-CM | POA: Insufficient documentation

## 2022-08-25 DIAGNOSIS — C7982 Secondary malignant neoplasm of genital organs: Secondary | ICD-10-CM

## 2022-08-25 MED ORDER — PIFLIFOLASTAT F 18 (PYLARIFY) INJECTION
9.0000 | Freq: Once | INTRAVENOUS | Status: AC
  Administered 2022-08-25: 9 via INTRAVENOUS

## 2022-08-27 DIAGNOSIS — G893 Neoplasm related pain (acute) (chronic): Secondary | ICD-10-CM | POA: Insufficient documentation

## 2022-08-27 NOTE — Assessment & Plan Note (Signed)
-  Stage IV with diffuse bone metastasis.  Diagnosed in 06/2021, castration resistant disease. -initial PSA>3000 --he started casodex on 1/10, and Lupron and Zometa on 07/06/21 in the hospital, and Zytiga '1000mg'$  with prednisone '10mg'$  in mid 07/2021 -his PSA initially responded well, coming down to 0.5 on 10/26/21. Unfortunately, his PSA rose to 19.3 on 01/19/22 and 99.6 on 02/13/22. -PSMA PET scan on 03/23/22 confirmed widespread intensely radiotracer-avid skeletal metastasis involving axillary and appendicular skeleton; no abnormal activity in prostate gland or lymph nodes. -his Zytiga was switched to chemo doxetaxel on 03/31/22, plan for up to 10 cycles. He is overall tolerating well, PSA is trending down but trended up again lately and he has developed worsening pain -restaging PSMA PET scan last week showed new subtle lesion in the dome of the RIGHT hepatic lobe which is concerning for prostate cancer liver metastasis. Mild progression previously demonstrated widespread sclerotic radiotracer avid skeletal metastasis -I recommend changing his treatment to Pluvicto, I have referred him to radiologist Dr. Leonia Reeves last week, I discussed the plan with pt and he agrees to proceed

## 2022-08-27 NOTE — Assessment & Plan Note (Signed)
-  She reports worsening diffuse bone and joint pain in the past week, likely related to his cancer progression -He is on gabapentin.  I prescribed oxycodone 5 mg every 6 hours as needed today -will refer him to palliative care clinic

## 2022-08-27 NOTE — Assessment & Plan Note (Signed)
-  continue meds and f/u with ID

## 2022-08-27 NOTE — Assessment & Plan Note (Signed)
-  Secondary to bone metastasis, previously responded to chemotherapy docetaxel -He has developed thrombocytopenia again due to the disease progression

## 2022-08-28 ENCOUNTER — Inpatient Hospital Stay: Payer: PPO | Attending: Hematology | Admitting: Hematology

## 2022-08-28 ENCOUNTER — Encounter: Payer: Self-pay | Admitting: Hematology

## 2022-08-28 DIAGNOSIS — D696 Thrombocytopenia, unspecified: Secondary | ICD-10-CM | POA: Diagnosis not present

## 2022-08-28 DIAGNOSIS — Z5189 Encounter for other specified aftercare: Secondary | ICD-10-CM | POA: Insufficient documentation

## 2022-08-28 DIAGNOSIS — Z79899 Other long term (current) drug therapy: Secondary | ICD-10-CM | POA: Insufficient documentation

## 2022-08-28 DIAGNOSIS — Z5111 Encounter for antineoplastic chemotherapy: Secondary | ICD-10-CM | POA: Insufficient documentation

## 2022-08-28 DIAGNOSIS — C7951 Secondary malignant neoplasm of bone: Secondary | ICD-10-CM

## 2022-08-28 DIAGNOSIS — C61 Malignant neoplasm of prostate: Secondary | ICD-10-CM

## 2022-08-28 DIAGNOSIS — B20 Human immunodeficiency virus [HIV] disease: Secondary | ICD-10-CM

## 2022-08-28 DIAGNOSIS — G893 Neoplasm related pain (acute) (chronic): Secondary | ICD-10-CM | POA: Diagnosis not present

## 2022-08-28 DIAGNOSIS — D6481 Anemia due to antineoplastic chemotherapy: Secondary | ICD-10-CM | POA: Insufficient documentation

## 2022-08-28 NOTE — Progress Notes (Signed)
[place]   [phone] [phone]   Clinic Follow up Note   Patient Care Team: [name], MD as PCP - General [name] [name], MD as PCP - Infectious Diseases (Infectious Diseases) Katheren Puller, RN as Oncology Nurse Navigator Truitt Merle, MD as Attending Physician (Hematology and Oncology)  Date of Service:  08/28/2022  I connected with Patrick [name] on 08/28/2022 at  1:00 PM EST by telephone visit and verified that I am speaking with the correct person using two identifiers.  I discussed the limitations, risks, security and privacy concerns of performing an evaluation and management service by telephone and the availability of in person appointments. I also discussed with the patient that there may be a patient responsible charge related to this service. The patient expressed understanding and agreed to proceed.   Other persons participating in the visit and their role in the encounter:  No  Patient's location:  Home Provider's location:  Office  CHIEF COMPLAINT: f/u of  metastatic prostate cancer    CURRENT THERAPY:  Zometa Q12WK  ASSESSMENT & PLAN:  Patrick [name] is a 72 y.o. 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 -initial PSA>3000 --he started casodex on 1/10, and Lupron and Zometa on 07/06/21 in the hospital, and Zytiga '1000mg'$  with prednisone '10mg'$  in mid 07/2021 -his PSA initially responded well, coming down to 0.5 on 10/26/21. Unfortunately, his PSA rose to 19.3 on 01/19/22 and 99.6 on 02/13/22. -PSMA PET scan on 03/23/22 confirmed widespread intensely radiotracer-avid skeletal metastasis involving axillary and appendicular skeleton; no abnormal activity in prostate gland or lymph nodes. -his Zytiga was switched to chemo doxetaxel on 03/31/22, plan for up to 10 cycles. He is overall tolerating well, PSA is trending down but  trended up again lately and he has developed worsening pain -restaging PSMA PET scan last week showed new subtle lesion in the dome of the RIGHT hepatic lobe which is concerning for prostate cancer liver metastasis. Mild progression previously demonstrated widespread sclerotic radiotracer avid skeletal metastasis -I recommend changing his treatment to Pluvicto, I have referred him to radiologist Dr. Leonia Reeves last week, I discussed the plan with pt and he agrees to proceed  -If her platelet count continues dropping significantly, he may not be able to go on Pluvicto treatment.  I will monitor closely.  I discussed the second line chemotherapy option cabazitaxel   Thrombocytopenia ([place]) -Secondary to bone metastasis, previously responded to chemotherapy docetaxel -He has developed thrombocytopenia again due to the disease progression  Human immunodeficiency virus (HIV) disease ([place]) -continue meds and f/u with ID    Cancer related pain -She reports worsening diffuse bone and joint pain in the past week, likely related to his cancer progression -He is on gabapentin.  I prescribed oxycodone 5 mg every 6 hours as needed today -he states his pain overall is controlled   Plan: -Reviewed PET scan results, will stop docetaxel due to disease progression.   -Discuss Pluvito, he will start on 4/2 - Restart the Eligard injection this Friday  -Discuss second line chemo treatment, will do if he is not able to get Pluvicto due to thrombocytopenia  -lab and Eligard injection 09/01/2022    SUMMARY OF ONCOLOGIC HISTORY: Oncology History Overview Note   Cancer Staging  Prostate cancer metastatic to bone [facility]) Staging form: Prostate, AJCC 8th Edition - Clinical stage from 07/01/2021: Stage  IVB (cTX, cNX, pM1b) - Signed by Truitt Merle, MD on 07/05/2021    Prostate cancer metastatic to bone [facility])  06/30/2021 Tumor Marker   Patient's tumor was tested for the following markers: PSA. Results of the tumor marker  test revealed >3,000.   07/01/2021 Cancer Staging   Staging form: Prostate, AJCC 8th Edition - Clinical stage from 07/01/2021: Stage IVB (cTX, cNX, pM1b) - Signed by Truitt Merle, MD on 07/05/2021   07/01/2021 Imaging   EXAM: CT CHEST, ABDOMEN, AND PELVIS WITH CONTRAST  IMPRESSION: Widespread osseous metastatic disease of unknown primary. Numerous pathologic fractures involving the ribs bilaterally as well as the T11, T12, L2, L3, and L4 vertebral bodies. Retropulsion of T11 and L3 results in severe central canal stenosis at L3. Multifactorial severe central canal stenosis also noted at L4, in part related to metastatic disease. Prominent soft tissue component involving the metastasis involving the spinous process of L5 may provide an appropriate target for tissue sampling for further evaluation.   Mild coronary artery calcification.   Thoracic aortic aneurysm with maximal transaxial dimension of 4.9 cm. Ascending thoracic aortic aneurysm. Recommend semi-annual imaging followup by CTA or MRA and referral to cardiothoracic surgery if not already obtained. This recommendation follows 2010 ACCF/AHA/AATS/ACR/ASA/SCA/SCAI/SIR/STS/SVM Guidelines for the Diagnosis and Management of Patients With Thoracic Aortic Disease. Circulation. 2010; [id number]. Aortic aneurysm NOS (ICD10-I71.9)   Moderate enlargement of the prostate gland. Correlation with serum PSA may be helpful.   07/01/2021 Imaging   EXAM: MRI HEAD WITHOUT CONTRAST  IMPRESSION: 1. No evidence of intracranial metastases on this noncontrast study. 2. Diffusely abnormal bone marrow signal consistent with known widespread osseous metastases.   07/01/2021 Pathology Results   FINAL MICROSCOPIC DIAGNOSIS:   A. SOFT TISSUE MASS, L5, NEEDLE CORE BIOPSY:  -  Metastatic carcinoma  -  See comment   COMMENT:  By immunohistochemistry, the neoplastic cells are positive for PSA and prostein but negative for cytokeratin 7, cytokeratin 20, CDX2 and  TTF-1. Overall, the immunoprofile is consistent with a prostatic primary.   07/02/2021 Initial Diagnosis   Prostate cancer metastatic to bone ([place])   07/21/2021 - 08/03/2021 Radiation Therapy   The painful site of metastasis at L5 was treated to 30 Gy in 10 fractions of 3 Gy each.   08/03/2021 Tumor Marker   Patient's tumor was tested for the following markers: PSA. Results of the tumor marker test revealed 76.8.   03/31/2022 - 08/14/2022 Chemotherapy   Patient is on Treatment Plan : PROSTATE Docetaxel (74) + Prednisone q21d     04/30/2022 Genetic Testing   Negative genetic testing on the CancerNext-Expanded+RNAinsight panel.  The report date is April 30, 2022.  The CancerNext-Expanded gene panel offered by [facility] and includes sequencing and rearrangement analysis for the following 77 genes: AIP, ALK, APC*, ATM*, AXIN2, BAP1, BARD1, BLM, BMPR1A, BRCA1*, BRCA2*, BRIP1*, CDC73, CDH1*, CDK4, CDKN1B, CDKN2A, CHEK2*, CTNNA1, DICER1, FANCC, FH, FLCN, GALNT12, KIF1B, LZTR1, MAX, MEN1, MET, MLH1*, MSH2*, MSH3, MSH6*, MUTYH*, NBN, NF1*, NF2, NTHL1, PALB2*, PHOX2B, PMS2*, POT1, PRKAR1A, PTCH1, PTEN*, RAD51C*, RAD51D*, RB1, RECQL, RET, SDHA, SDHAF2, SDHB, SDHC, SDHD, SMAD4, SMARCA4, SMARCB1, SMARCE1, STK11, SUFU, TMEM127, TP53*, TSC1, TSC2, VHL and XRCC2 (sequencing and deletion/duplication); EGFR, EGLN1, HOXB13, KIT, MITF, PDGFRA, POLD1, and POLE (sequencing only); EPCAM and GREM1 (deletion/duplication only). DNA and RNA analyses performed for * genes.       INTERVAL HISTORY:  Patrick [name] was contacted for a follow up of  metastatic prostate cancer  He was  last seen by me on 08/11/2022 Pt states his pain is very little but doesn't need to take anything for pain. Pt state he only takes Tylenol and Gabapentin.    .    All other systems were reviewed with the patient and are negative.  MEDICAL HISTORY:  Past Medical History:  Diagnosis Date   Allergy    seasonal   Arthritis    r knee    H/O inguinal hernia repair    History of tobacco use    HIV infection ([place])    Hyperlipidemia    Hypertension    Strabismic amblyopia     SURGICAL HISTORY: Past Surgical History:  Procedure Laterality Date   EYE SURGERY     strabismus   HERNIA REPAIR     IR BONE TUMOR(S)RF ABLATION  10/18/2021   IR BONE TUMOR(S)RF ABLATION  10/18/2021   IR KYPHO EA ADDL LEVEL THORACIC OR LUMBAR  10/18/2021   IR KYPHO LUMBAR INC FX REDUCE BONE BX UNI/BIL CANNULATION INC/IMAGING  10/18/2021    I have reviewed the social history and family history with the patient and they are unchanged from previous note.  ALLERGIES:  has No Known Allergies.  MEDICATIONS:  Current Outpatient Medications  Medication Sig Dispense Refill   albuterol (VENTOLIN HFA) 108 (90 Base) MCG/ACT inhaler Inhale 2 puffs into the lungs every 6 (six) hours as needed for shortness of breath or wheezing.     amLODipine (NORVASC) 5 MG tablet Take 1 tablet (5 mg total) by mouth daily. 30 tablet 11   atorvastatin (LIPITOR) 40 MG tablet TAKE 1 TABLET(40 MG) BY MOUTH DAILY 90 tablet 2   calcium citrate-vitamin D (CITRACAL+D) 315-200 MG-UNIT tablet Take 1 tablet by mouth daily.     celecoxib (CELEBREX) 200 MG capsule Take 1 capsule (200 mg total) by mouth 2 (two) times daily. 30 capsule 1   emtricitabine-rilpivir-tenofovir AF (ODEFSEY) 200-25-25 MG TABS tablet Take 1 tablet by mouth daily. 90 tablet 3   gabapentin (NEURONTIN) 300 MG capsule TAKE 1 CAPSULE(300 MG) BY MOUTH THREE TIMES DAILY 90 capsule 1   lidocaine (LIDODERM) 5 % Place 1 patch onto the skin daily. Remove & Discard patch within 12 hours or as directed by MD 30 patch 0   Multiple Vitamins-Minerals (MULTIVITAMIN WITH MINERALS) tablet Take 1 tablet by mouth daily.     oxyCODONE (OXY IR/ROXICODONE) 5 MG immediate release tablet Take 1 tablet (5 mg total) by mouth every 6 (six) hours as needed for severe pain. 60 tablet 0   No current facility-administered medications for this  visit.    PHYSICAL EXAMINATION: ECOG PERFORMANCE STATUS: 2 - Symptomatic, <50% confined to bed  There were no vitals filed for this visit. Wt Readings from Last 3 Encounters:  07/21/22 177 lb 9.6 oz (80.6 kg)  06/30/22 178 lb 9.6 oz (81 kg)  06/02/22 167 lb 9.6 oz (76 kg)     No vitals taken today, Exam not performed today  LABORATORY DATA:  I have reviewed the data as listed    Latest Ref Rng & Units 08/11/2022    1:44 PM 07/21/2022    9:51 AM 06/30/2022   10:11 AM  CBC  WBC 4.0 - 10.5 K/uL 15.4  7.7  4.7   Hemoglobin 13.0 - 17.0 g/dL 9.0  10.0  9.8   Hematocrit 39.0 - 52.0 % 26.4  29.7  29.0   Platelets 150 - 400 K/uL 82  136  169  Latest Ref Rng & Units 08/11/2022    1:44 PM 07/21/2022    9:51 AM 06/30/2022   10:11 AM  CMP  Glucose 70 - 99 mg/dL 166  99  129   BUN 8 - 23 mg/dL '22  22  16   '$ Creatinine 0.61 - 1.24 mg/dL 0.72  0.70  0.61   Sodium 135 - 145 mmol/L 131  135  136   Potassium 3.5 - 5.1 mmol/L 4.7  4.9  4.7   Chloride 98 - 111 mmol/L 96  102  103   CO2 22 - 32 mmol/L '22  25  23   '$ Calcium 8.9 - 10.3 mg/dL 10.2  9.6  9.3   Total Protein 6.5 - 8.1 g/dL 6.7  6.8  6.2   Total Bilirubin 0.3 - 1.2 mg/dL 0.5  0.5  0.5   Alkaline Phos 38 - 126 U/L 106  125  161   AST 15 - 41 U/L 80  32  22   ALT 0 - 44 U/L '12  13  13       '$ RADIOGRAPHIC STUDIES: I have personally reviewed the radiological images as listed and agreed with the findings in the report. No results found.    No orders of the defined types were placed in this encounter.  All questions were answered. The patient knows to call the clinic with any problems, questions or concerns. No barriers to learning was detected. The total time spent in the appointment was 22 minutes.     Truitt Merle, MD 08/28/2022   Felicity Coyer am acting as scribe for Truitt Merle, MD.   I have reviewed the above documentation for accuracy and completeness, and I agree with the above.

## 2022-08-31 ENCOUNTER — Other Ambulatory Visit: Payer: Self-pay

## 2022-08-31 ENCOUNTER — Telehealth: Payer: Self-pay

## 2022-08-31 DIAGNOSIS — C61 Malignant neoplasm of prostate: Secondary | ICD-10-CM

## 2022-08-31 NOTE — Telephone Encounter (Signed)
Per patient request to move appointment sooner than scheduled time. Patient aware of date and time of appointment

## 2022-09-01 ENCOUNTER — Telehealth: Payer: Self-pay | Admitting: Hematology

## 2022-09-01 ENCOUNTER — Inpatient Hospital Stay: Payer: PPO

## 2022-09-01 ENCOUNTER — Ambulatory Visit: Payer: PPO | Admitting: Hematology

## 2022-09-01 ENCOUNTER — Other Ambulatory Visit: Payer: Self-pay

## 2022-09-01 VITALS — BP 102/61 | HR 92 | Temp 98.0°F | Resp 18

## 2022-09-01 DIAGNOSIS — Z5189 Encounter for other specified aftercare: Secondary | ICD-10-CM | POA: Diagnosis not present

## 2022-09-01 DIAGNOSIS — Z5111 Encounter for antineoplastic chemotherapy: Secondary | ICD-10-CM | POA: Diagnosis not present

## 2022-09-01 DIAGNOSIS — Z79899 Other long term (current) drug therapy: Secondary | ICD-10-CM | POA: Diagnosis not present

## 2022-09-01 DIAGNOSIS — D6481 Anemia due to antineoplastic chemotherapy: Secondary | ICD-10-CM | POA: Diagnosis not present

## 2022-09-01 DIAGNOSIS — G893 Neoplasm related pain (acute) (chronic): Secondary | ICD-10-CM | POA: Diagnosis not present

## 2022-09-01 DIAGNOSIS — C61 Malignant neoplasm of prostate: Secondary | ICD-10-CM

## 2022-09-01 DIAGNOSIS — B20 Human immunodeficiency virus [HIV] disease: Secondary | ICD-10-CM | POA: Diagnosis not present

## 2022-09-01 DIAGNOSIS — D696 Thrombocytopenia, unspecified: Secondary | ICD-10-CM | POA: Diagnosis not present

## 2022-09-01 DIAGNOSIS — C7951 Secondary malignant neoplasm of bone: Secondary | ICD-10-CM | POA: Diagnosis not present

## 2022-09-01 DIAGNOSIS — D649 Anemia, unspecified: Secondary | ICD-10-CM

## 2022-09-01 LAB — CMP (CANCER CENTER ONLY)
ALT: 10 U/L (ref 0–44)
AST: 59 U/L — ABNORMAL HIGH (ref 15–41)
Albumin: 4.2 g/dL (ref 3.5–5.0)
Alkaline Phosphatase: 104 U/L (ref 38–126)
Anion gap: 10 (ref 5–15)
BUN: 26 mg/dL — ABNORMAL HIGH (ref 8–23)
CO2: 23 mmol/L (ref 22–32)
Calcium: 9.4 mg/dL (ref 8.9–10.3)
Chloride: 102 mmol/L (ref 98–111)
Creatinine: 0.94 mg/dL (ref 0.61–1.24)
GFR, Estimated: >60 mL/min (ref >60)
Glucose, Bld: 113 mg/dL — ABNORMAL HIGH (ref 70–99)
Potassium: 4.8 mmol/L (ref 3.5–5.1)
Sodium: 135 mmol/L (ref 135–145)
Total Bilirubin: 0.4 mg/dL (ref 0.3–1.2)
Total Protein: 6.4 g/dL — ABNORMAL LOW (ref 6.5–8.1)

## 2022-09-01 LAB — CBC WITH DIFFERENTIAL (CANCER CENTER ONLY)
Abs Immature Granulocytes: 0.61 10*3/uL — ABNORMAL HIGH (ref 0.00–0.07)
Basophils Absolute: 0.1 10*3/uL (ref 0.0–0.1)
Basophils Relative: 1 %
Eosinophils Absolute: 0 10*3/uL (ref 0.0–0.5)
Eosinophils Relative: 0 %
HCT: 21.6 % — ABNORMAL LOW (ref 39.0–52.0)
Hemoglobin: 7.1 g/dL — ABNORMAL LOW (ref 13.0–17.0)
Immature Granulocytes: 6 %
Lymphocytes Relative: 9 %
Lymphs Abs: 0.9 10*3/uL (ref 0.7–4.0)
MCH: 32.4 pg (ref 26.0–34.0)
MCHC: 32.9 g/dL (ref 30.0–36.0)
MCV: 98.6 fL (ref 80.0–100.0)
Monocytes Absolute: 1 10*3/uL (ref 0.1–1.0)
Monocytes Relative: 9 %
Neutro Abs: 8.3 10*3/uL — ABNORMAL HIGH (ref 1.7–7.7)
Neutrophils Relative %: 75 %
Platelet Count: 61 10*3/uL — ABNORMAL LOW (ref 150–400)
RBC: 2.19 MIL/uL — ABNORMAL LOW (ref 4.22–5.81)
RDW: 17 % — ABNORMAL HIGH (ref 11.5–15.5)
WBC Count: 10.9 10*3/uL — ABNORMAL HIGH (ref 4.0–10.5)
nRBC: 0.3 % — ABNORMAL HIGH (ref 0.0–0.2)

## 2022-09-01 LAB — SAMPLE TO BLOOD BANK

## 2022-09-01 MED ORDER — LEUPROLIDE ACETATE (6 MONTH) 45 MG ~~LOC~~ KIT
45.0000 mg | PACK | Freq: Once | SUBCUTANEOUS | Status: AC
  Administered 2022-09-01: 45 mg via SUBCUTANEOUS
  Filled 2022-09-01: qty 45

## 2022-09-01 NOTE — Patient Instructions (Signed)
Leuprolide Suspension for Injection (Prostate Cancer) What is this medication? LEUPROLIDE (loo PROE lide) reduces the symptoms of prostate cancer. It works by decreasing levels of the hormone testosterone in the body. This prevents prostate cancer cells from spreading or growing. This medicine may be used for other purposes; ask your health care provider or pharmacist if you have questions. COMMON BRAND NAME(S): Eligard, Lupron Depot, Lupron Depot-Ped, Lutrate Depot, Viadur What should I tell my care team before I take this medication? They need to know if you have any of these conditions: Diabetes Heart disease Heart failure High or low levels of electrolytes, such as magnesium, potassium, or sodium in your blood Irregular heartbeat or rhythm Seizures An unusual or allergic reaction to leuprolide, other medications, foods, dyes, or preservatives Pregnant or trying to get pregnant Breast-feeding How should I use this medication? This medication is injected under the skin or into a muscle. It is given by your care team in a hospital or clinic setting. Talk to your care team about the use of this medication in children. Special care may be needed. Overdosage: If you think you have taken too much of this medicine contact a poison control center or emergency room at once. NOTE: This medicine is only for you. Do not share this medicine with others. What if I miss a dose? Keep appointments for follow-up doses. It is important not to miss your dose. Call your care team if you are unable to keep an appointment. What may interact with this medication? Do not take this medication with any of the following: Cisapride Dronedarone Ketoconazole Levoketoconazole Pimozide Thioridazine This medication may also interact with the following: Other medications that cause heart rhythm changes This list may not describe all possible interactions. Give your health care provider a list of all the medicines,  herbs, non-prescription drugs, or dietary supplements you use. Also tell them if you smoke, drink alcohol, or use illegal drugs. Some items may interact with your medicine. What should I watch for while using this medication? Visit your care team for regular checks on your progress. Tell your care team if your symptoms do not start to get better or if they get worse. This medication may increase blood sugar. The risk may be higher in patients who already have diabetes. Ask your care team what you can do to lower the risk of diabetes while taking this medication. This medication may cause infertility. Talk to your care team if you are concerned about your fertility. Heart attacks and strokes have been reported with the use of this medication. Get emergency help if you develop signs or symptoms of a heart attack or stroke. Talk to your care team about the risks and benefits of this medication. What side effects may I notice from receiving this medication? Side effects that you should report to your care team as soon as possible: Allergic reactions--skin rash, itching, hives, swelling of the face, lips, tongue, or throat Heart attack--pain or tightness in the chest, shoulders, arms, or jaw, nausea, shortness of breath, cold or clammy skin, feeling faint or lightheaded Heart rhythm changes--fast or irregular heartbeat, dizziness, feeling faint or lightheaded, chest pain, trouble breathing High blood sugar (hyperglycemia)--increased thirst or amount of urine, unusual weakness or fatigue, blurry vision Mood swings, irritability, hostility Seizures Stroke--sudden numbness or weakness of the face, arm, or leg, trouble speaking, confusion, trouble walking, loss of balance or coordination, dizziness, severe headache, change in vision Thoughts of suicide or self-harm, worsening mood, feelings of depression Side   effects that usually do not require medical attention (report to your care team if they continue or  are bothersome): Bone pain Change in sex drive or performance General discomfort and fatigue Hot flashes Muscle pain Pain, redness, or irritation at injection site Swelling of the ankles, hands, or feet This list may not describe all possible side effects. Call your doctor for medical advice about side effects. You may report side effects to FDA at 1-800-FDA-1088. Where should I keep my medication? This medication is given in a hospital or clinic. It will not be stored at home. NOTE: This sheet is a summary. It may not cover all possible information. If you have questions about this medicine, talk to your doctor, pharmacist, or health care provider.  2023 Elsevier/Gold Standard (2021-08-22 00:00:00)  

## 2022-09-01 NOTE — Telephone Encounter (Signed)
Contacted patient to scheduled appointments. Patient is aware of appointments that are scheduled.   

## 2022-09-02 ENCOUNTER — Other Ambulatory Visit: Payer: Self-pay | Admitting: Hematology

## 2022-09-02 DIAGNOSIS — C61 Malignant neoplasm of prostate: Secondary | ICD-10-CM

## 2022-09-02 MED ORDER — PREDNISONE 10 MG PO TABS
10.0000 mg | ORAL_TABLET | Freq: Every day | ORAL | 3 refills | Status: DC
  Filled 2022-09-02: qty 21, 21d supply, fill #0
  Filled 2022-09-16: qty 21, 21d supply, fill #1

## 2022-09-02 NOTE — Progress Notes (Signed)
DISCONTINUE ON PATHWAY REGIMEN - Prostate     Cycles 1 through up to 6: A cycle is every 42 days:     Lutetium Lu 177 vipivotide tetraxetan   **Always confirm dose/schedule in your pharmacy ordering system**  REASON: Other Reason PRIOR TREATMENT: [id number]: Referral for Lutetium Lu 177 Vipivotide Tetraxetan (lutetium-[id number]) TREATMENT RESPONSE: Unable to Evaluate  START ON PATHWAY REGIMEN - Prostate     A cycle is every 21 days.:     Cabazitaxel      Prednisone   **Always confirm dose/schedule in your pharmacy ordering system**  Patient Characteristics: Adenocarcinoma, Recurrent/New Systemic Disease (Including Biochemical Recurrence), Castration Resistant, M1, Prior Novel Hormonal Agent, No Molecular Alteration or Targeted Therapy Exhausted, Prior Docetaxel/Docetaxel Not Indicated Histology: Adenocarcinoma Therapeutic Status: Recurrent/New Systemic Disease (Including Biochemical Recurrence)  Intent of Therapy: Non-Curative / Palliative Intent, Discussed with Patient

## 2022-09-04 ENCOUNTER — Other Ambulatory Visit (HOSPITAL_COMMUNITY): Payer: Self-pay

## 2022-09-04 ENCOUNTER — Ambulatory Visit: Payer: PPO

## 2022-09-04 ENCOUNTER — Telehealth: Payer: Self-pay | Admitting: Hematology

## 2022-09-04 ENCOUNTER — Other Ambulatory Visit: Payer: Self-pay

## 2022-09-04 ENCOUNTER — Telehealth: Payer: Self-pay

## 2022-09-04 NOTE — Telephone Encounter (Signed)
Contacted patient to scheduled appointments. Left message with appointment details and a call back number if patient had any questions or could not accommodate the time we provided.   

## 2022-09-04 NOTE — Telephone Encounter (Signed)
LVM requesting if pt would please give Dr. Ernestina Penna office a call regarding a change in his tx plan.  Informed pt that this RN will also send him a MyChart message.

## 2022-09-05 ENCOUNTER — Other Ambulatory Visit: Payer: Self-pay

## 2022-09-05 ENCOUNTER — Inpatient Hospital Stay (HOSPITAL_BASED_OUTPATIENT_CLINIC_OR_DEPARTMENT_OTHER): Payer: PPO | Admitting: Hematology

## 2022-09-05 ENCOUNTER — Encounter: Payer: Self-pay | Admitting: Hematology

## 2022-09-05 ENCOUNTER — Inpatient Hospital Stay: Payer: PPO

## 2022-09-05 VITALS — BP 111/61 | HR 91 | Temp 98.2°F | Resp 18 | Ht 70.0 in | Wt 165.4 lb

## 2022-09-05 DIAGNOSIS — C61 Malignant neoplasm of prostate: Secondary | ICD-10-CM

## 2022-09-05 DIAGNOSIS — D649 Anemia, unspecified: Secondary | ICD-10-CM

## 2022-09-05 DIAGNOSIS — C7951 Secondary malignant neoplasm of bone: Secondary | ICD-10-CM | POA: Diagnosis not present

## 2022-09-05 DIAGNOSIS — Z5111 Encounter for antineoplastic chemotherapy: Secondary | ICD-10-CM | POA: Diagnosis not present

## 2022-09-05 LAB — CBC WITH DIFFERENTIAL (CANCER CENTER ONLY)
Abs Immature Granulocytes: 0.79 10*3/uL — ABNORMAL HIGH (ref 0.00–0.07)
Basophils Absolute: 0.1 10*3/uL (ref 0.0–0.1)
Basophils Relative: 1 %
Eosinophils Absolute: 0.1 10*3/uL (ref 0.0–0.5)
Eosinophils Relative: 1 %
HCT: 21.1 % — ABNORMAL LOW (ref 39.0–52.0)
Hemoglobin: 7 g/dL — ABNORMAL LOW (ref 13.0–17.0)
Immature Granulocytes: 8 %
Lymphocytes Relative: 13 %
Lymphs Abs: 1.4 10*3/uL (ref 0.7–4.0)
MCH: 32.9 pg (ref 26.0–34.0)
MCHC: 33.2 g/dL (ref 30.0–36.0)
MCV: 99.1 fL (ref 80.0–100.0)
Monocytes Absolute: 1.1 10*3/uL — ABNORMAL HIGH (ref 0.1–1.0)
Monocytes Relative: 10 %
Neutro Abs: 7.2 10*3/uL (ref 1.7–7.7)
Neutrophils Relative %: 67 %
Platelet Count: 67 10*3/uL — ABNORMAL LOW (ref 150–400)
RBC: 2.13 MIL/uL — ABNORMAL LOW (ref 4.22–5.81)
RDW: 17.2 % — ABNORMAL HIGH (ref 11.5–15.5)
WBC Count: 10.6 10*3/uL — ABNORMAL HIGH (ref 4.0–10.5)
nRBC: 0.3 % — ABNORMAL HIGH (ref 0.0–0.2)

## 2022-09-05 LAB — CMP (CANCER CENTER ONLY)
ALT: 10 U/L (ref 0–44)
AST: 67 U/L — ABNORMAL HIGH (ref 15–41)
Albumin: 4 g/dL (ref 3.5–5.0)
Alkaline Phosphatase: 115 U/L (ref 38–126)
Anion gap: 12 (ref 5–15)
BUN: 32 mg/dL — ABNORMAL HIGH (ref 8–23)
CO2: 21 mmol/L — ABNORMAL LOW (ref 22–32)
Calcium: 9.3 mg/dL (ref 8.9–10.3)
Chloride: 102 mmol/L (ref 98–111)
Creatinine: 1.27 mg/dL — ABNORMAL HIGH (ref 0.61–1.24)
GFR, Estimated: >60 mL/min (ref >60)
Glucose, Bld: 128 mg/dL — ABNORMAL HIGH (ref 70–99)
Potassium: 4.6 mmol/L (ref 3.5–5.1)
Sodium: 135 mmol/L (ref 135–145)
Total Bilirubin: 0.4 mg/dL (ref 0.3–1.2)
Total Protein: 6.3 g/dL — ABNORMAL LOW (ref 6.5–8.1)

## 2022-09-05 LAB — SAMPLE TO BLOOD BANK

## 2022-09-05 LAB — PREPARE RBC (CROSSMATCH)

## 2022-09-05 MED ORDER — SODIUM CHLORIDE 0.9% IV SOLUTION
250.0000 mL | Freq: Once | INTRAVENOUS | Status: AC
  Administered 2022-09-05: 250 mL via INTRAVENOUS

## 2022-09-05 MED ORDER — SODIUM CHLORIDE 0.9 % IV SOLN: Freq: Once | INTRAVENOUS | Status: AC

## 2022-09-05 MED ORDER — SODIUM CHLORIDE 0.9 % IV SOLN
10.0000 mg | Freq: Once | INTRAVENOUS | Status: AC
  Administered 2022-09-05: 10 mg via INTRAVENOUS
  Filled 2022-09-05: qty 10

## 2022-09-05 MED ORDER — DIPHENHYDRAMINE HCL 50 MG/ML IJ SOLN
25.0000 mg | Freq: Once | INTRAMUSCULAR | Status: AC
  Administered 2022-09-05: 25 mg via INTRAVENOUS
  Filled 2022-09-05: qty 1

## 2022-09-05 MED ORDER — FAMOTIDINE IN NACL 20-0.9 MG/50ML-% IV SOLN
20.0000 mg | Freq: Once | INTRAVENOUS | Status: AC
  Administered 2022-09-05: 20 mg via INTRAVENOUS
  Filled 2022-09-05: qty 50

## 2022-09-05 MED ORDER — SODIUM CHLORIDE 0.9 % IV SOLN
20.0000 mg/m2 | Freq: Once | INTRAVENOUS | Status: AC
  Administered 2022-09-05: 40 mg via INTRAVENOUS
  Filled 2022-09-05: qty 4

## 2022-09-05 NOTE — Progress Notes (Signed)
[place]   Telephone:(336) [phone] [phone]) [phone]   Clinic Follow up Note   Patient Care Team: [name], MD as PCP - General [name] [name], MD as PCP - Infectious Diseases (Infectious Diseases) Katheren Puller, RN as Oncology Nurse Navigator Truitt Merle, MD as Attending Physician (Hematology and Oncology)  Date of Service:  09/05/2022  CHIEF COMPLAINT: f/u of  metastatic prostate cancer    CURRENT THERAPY:  Zometa Q12WK Eligard '45mg'$  q6h Second line chemo cabazitaxel every 3 weeks, starting today   ASSESSMENT & PLAN:  Patrick [name] is a 72 y.o. 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 -initial PSA>3000 --he started casodex on 1/10, and Lupron and Zometa on 07/06/21 in the hospital, and Zytiga '1000mg'$  with prednisone '10mg'$  in mid 07/2021 -his PSA initially responded well, coming down to 0.5 on 10/26/21. Unfortunately, his PSA rose to 19.3 on 01/19/22 and 99.6 on 02/13/22. -PSMA PET scan on 03/23/22 confirmed widespread intensely radiotracer-avid skeletal metastasis involving axillary and appendicular skeleton; no abnormal activity in prostate gland or lymph nodes. -his Zytiga was switched to chemo doxetaxel on 03/31/22, plan for up to 10 cycles. He is overall tolerating well, PSA is trending down but trended up again lately and he has developed worsening pain -restaging PSMA PET scan 08/25/2022 showed new subtle lesion in the dome of the RIGHT hepatic lobe which is concerning for prostate cancer liver metastasis. Mild progression previously demonstrated widespread sclerotic radiotracer avid skeletal metastasis -I recommend changing his treatment to Pluvicto, I have referred him to radiologist Dr. Leonia Reeves and he was scheduled to start on 09/27/2022, this will be held due to his worsening anemia and thrombocytopenia. -Due to his worsening anemia and thrombocytopenia,  I recommend him to start second line chemotherapy cabazitaxel today.  I discussed benefit and potential side effect, especially cytopenias, risk of infection, nausea, diarrhea, interstitial lung disease, peripheral neuropathy, edema, etc.  He agrees to proceed. -The goal of therapy is palliative, to improve his cancer related symptoms, and prolong his life.  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  He recently started oxycodone 5 mg as needed which helps his pain control -he states his pain overall is controlled     Goal of care discussion  -We again discussed the incurable nature of his cancer, and the overall poor prognosis, especially if he does not have good response to chemotherapy or progress on chemo -The patient understands the goal of care is palliative. -I recommend DNR/DNI, he agrees, order signed  -He lives alone, I recommend home care, he is little reluctant to have somebody come into his home.  He will think about it -He lives alone, his mother is still alive in a nursing home, he has a sister in [place].  He has not told his family much about his cancer.  I again encouraged him to discuss with his sister.   Plan: -Lab reviewed, will proceed for cycle Jevtana today with GCSF on this Saturday  when he comes in for blood transfusion -Will give him 1 unit of blood transfusion today -Lab and follow-up in 1 week for toxicity checkup. -He will pick up prednisone from pharmacy today, and start taking daily.    -I signed his DNR order and gave him a copy of that     SUMMARY OF ONCOLOGIC HISTORY: Oncology History Overview Note   Cancer Staging  Prostate cancer metastatic to bone [facility]) Staging form: Prostate, AJCC 8th Edition - Clinical stage from 07/01/2021: Stage IVB (cTX, cNX, pM1b) - Signed by Truitt Merle, MD on 07/05/2021    Prostate cancer metastatic to bone [facility])  06/30/2021 Tumor Marker   Patient's tumor was tested for the following markers: PSA. Results of the tumor marker test revealed >3,000.   07/01/2021 Cancer Staging   Staging form: Prostate, AJCC 8th Edition - Clinical stage from 07/01/2021: Stage IVB (cTX, cNX, pM1b) - Signed by Truitt Merle, MD on 07/05/2021   07/01/2021 Imaging   EXAM: CT CHEST, ABDOMEN, AND PELVIS WITH CONTRAST  IMPRESSION: Widespread osseous metastatic disease of unknown primary. Numerous pathologic fractures involving the ribs bilaterally as well as the T11, T12, L2, L3, and L4 vertebral bodies. Retropulsion of T11 and L3 results in severe central canal stenosis at L3. Multifactorial severe central canal stenosis also noted at L4, in part related to metastatic disease. Prominent soft tissue component involving the metastasis involving the spinous process of L5 may provide an appropriate target for tissue sampling for further evaluation.   Mild coronary artery calcification.   Thoracic aortic aneurysm with maximal transaxial dimension of 4.9 cm. Ascending thoracic aortic aneurysm. Recommend semi-annual imaging followup by CTA or MRA and referral to cardiothoracic surgery if not already obtained. This recommendation follows 2010 ACCF/AHA/AATS/ACR/ASA/SCA/SCAI/SIR/STS/SVM Guidelines for the Diagnosis and Management of Patients With Thoracic Aortic Disease. Circulation. 2010; [id number]. Aortic aneurysm NOS (ICD10-I71.9)   Moderate enlargement of the prostate gland. Correlation with serum PSA may be helpful.   07/01/2021 Imaging   EXAM: MRI HEAD WITHOUT  CONTRAST  IMPRESSION: 1. No evidence of intracranial metastases on this noncontrast study. 2. Diffusely abnormal bone marrow signal consistent with known widespread osseous metastases.   07/01/2021 Pathology Results   FINAL MICROSCOPIC DIAGNOSIS:   A. SOFT TISSUE MASS, L5, NEEDLE CORE BIOPSY:  -  Metastatic carcinoma  -  See comment   COMMENT:  By immunohistochemistry, the neoplastic cells are positive for PSA and prostein but negative for cytokeratin 7, cytokeratin 20, CDX2 and TTF-1. Overall, the immunoprofile is consistent with a prostatic primary.   07/02/2021 Initial Diagnosis   Prostate cancer metastatic to bone ([place])   07/21/2021 - 08/03/2021 Radiation Therapy   The painful site of metastasis at L5 was treated to 30 Gy in 10 fractions of 3 Gy each.   08/03/2021 Tumor Marker   Patient's tumor was tested for the following markers: PSA. Results of the tumor marker test revealed 76.8.   03/31/2022 - 08/14/2022 Chemotherapy   Patient is on Treatment Plan : PROSTATE Docetaxel (74) + Prednisone q21d     04/30/2022 Genetic Testing   Negative genetic testing on the CancerNext-Expanded+RNAinsight panel.  The report date is April 30, 2022.  The CancerNext-Expanded gene panel offered by [facility] and includes sequencing and rearrangement analysis for the following 77 genes: AIP, ALK, APC*, ATM*, AXIN2, BAP1, BARD1, BLM, BMPR1A, BRCA1*, BRCA2*, BRIP1*, CDC73, CDH1*, CDK4, CDKN1B, CDKN2A, CHEK2*, CTNNA1, DICER1, FANCC, FH, FLCN, GALNT12, KIF1B, LZTR1, MAX, MEN1, MET, MLH1*, MSH2*, MSH3, MSH6*, MUTYH*, NBN, NF1*,  NF2, NTHL1, PALB2*, PHOX2B, PMS2*, POT1, PRKAR1A, PTCH1, PTEN*, RAD51C*, RAD51D*, RB1, RECQL, RET, SDHA, SDHAF2, SDHB, SDHC, SDHD, SMAD4, SMARCA4, SMARCB1, SMARCE1, STK11, SUFU, TMEM127, TP53*, TSC1, TSC2, VHL and XRCC2 (sequencing and deletion/duplication); EGFR, EGLN1, HOXB13, KIT, MITF, PDGFRA, POLD1, and POLE (sequencing only); EPCAM and GREM1 (deletion/duplication only). DNA and RNA  analyses performed for * genes.    09/07/2022 -  Chemotherapy   Patient is on Treatment Plan : PROSTATE Cabazitaxel (20) D1 + Prednisone D1-21 q21d        INTERVAL HISTORY:  KAMEEL TAITANO [name] was contacted for a follow up of  metastatic prostate cancer  He was last seen by me on 08/28/2022.  He started use oxycodone as needed since last visit, which helped his pain control.  He does have drowsiness from the medicine.  His pain overall is better controlled.  He lives alone, still able to take care of self, but can only do limited housework.  No other new complaints..    All other systems were reviewed with the patient and are negative.  MEDICAL HISTORY:  Past Medical History:  Diagnosis Date   Allergy    seasonal   Arthritis    r knee   H/O inguinal hernia repair    History of tobacco use    HIV infection ([place])    Hyperlipidemia    Hypertension    Strabismic amblyopia     SURGICAL HISTORY: Past Surgical History:  Procedure Laterality Date   EYE SURGERY     strabismus   HERNIA REPAIR     IR BONE TUMOR(S)RF ABLATION  10/18/2021   IR BONE TUMOR(S)RF ABLATION  10/18/2021   IR KYPHO EA ADDL LEVEL THORACIC OR LUMBAR  10/18/2021   IR KYPHO LUMBAR INC FX REDUCE BONE BX UNI/BIL CANNULATION INC/IMAGING  10/18/2021    I have reviewed the social history and family history with the patient and they are unchanged from previous note.  ALLERGIES:  has No Known Allergies.  MEDICATIONS:  Current Outpatient Medications  Medication Sig Dispense Refill   albuterol (VENTOLIN HFA) 108 (90 Base) MCG/ACT inhaler Inhale 2 puffs into the lungs every 6 (six) hours as needed for shortness of breath or wheezing.     amLODipine (NORVASC) 5 MG tablet Take 1 tablet (5 mg total) by mouth daily. 30 tablet 11   atorvastatin (LIPITOR) 40 MG tablet TAKE 1 TABLET(40 MG) BY MOUTH DAILY 90 tablet 2   calcium citrate-vitamin D (CITRACAL+D) 315-200 MG-UNIT tablet Take 1 tablet by mouth daily.     celecoxib  (CELEBREX) 200 MG capsule Take 1 capsule (200 mg total) by mouth 2 (two) times daily. 30 capsule 1   emtricitabine-rilpivir-tenofovir AF (ODEFSEY) 200-25-25 MG TABS tablet Take 1 tablet by mouth daily. 90 tablet 3   gabapentin (NEURONTIN) 300 MG capsule TAKE 1 CAPSULE(300 MG) BY MOUTH THREE TIMES DAILY 90 capsule 1   lidocaine (LIDODERM) 5 % Place 1 patch onto the skin daily. Remove & Discard patch within 12 hours or as directed by MD 30 patch 0   Multiple Vitamins-Minerals (MULTIVITAMIN WITH MINERALS) tablet Take 1 tablet by mouth daily.     oxyCODONE (OXY IR/ROXICODONE) 5 MG immediate release tablet Take 1 tablet (5 mg total) by mouth every 6 (six) hours as needed for severe pain. 60 tablet 0   predniSONE (DELTASONE) 10 MG tablet Take 1 tablet (10 mg total) by mouth daily. 21 tablet 3   No current facility-administered medications for this visit.    PHYSICAL  EXAMINATION: ECOG PERFORMANCE STATUS: 2 - Symptomatic, <50% confined to bed  Vitals:   09/05/22 0849  BP: 107/63  Pulse: 99  Resp: 18  Temp: 98 F (36.7 C)  SpO2: 97%   Wt Readings from Last 3 Encounters:  09/05/22 165 lb 6.4 oz (75 kg)  07/21/22 177 lb 9.6 oz (80.6 kg)  06/30/22 178 lb 9.6 oz (81 kg)    LUNGS: (-) clear to auscultation and percussion with normal breathing effort HEART: (-) regular rate & rhythm and no murmurs and no lower extremity edema ABDOMEN:abdomen soft, non-tender and normal bowel sounds Musculoskeletal:no cyanosis of digits and no clubbing  NEURO: alert & oriented x 3 with fluent speech, no focal motor/sensory deficits  LABORATORY DATA:  I have reviewed the data as listed    Latest Ref Rng & Units 09/05/2022    8:22 AM 09/01/2022   11:50 AM 08/11/2022    1:44 PM  CBC  WBC 4.0 - 10.5 K/uL 10.6  10.9  15.4   Hemoglobin 13.0 - 17.0 g/dL 7.0  7.1  9.0   Hematocrit 39.0 - 52.0 % 21.1  21.6  26.4   Platelets 150 - 400 K/uL 67  61  82         Latest Ref Rng & Units 09/05/2022    8:22 AM 09/01/2022    11:50 AM 08/11/2022    1:44 PM  CMP  Glucose 70 - 99 mg/dL 128  113  166   BUN 8 - 23 mg/dL 32  26  22   Creatinine 0.61 - 1.24 mg/dL 1.27  0.94  0.72   Sodium 135 - 145 mmol/L 135  135  131   Potassium 3.5 - 5.1 mmol/L 4.6  4.8  4.7   Chloride 98 - 111 mmol/L 102  102  96   CO2 22 - 32 mmol/L '21  23  22   '$ Calcium 8.9 - 10.3 mg/dL 9.3  9.4  10.2   Total Protein 6.5 - 8.1 g/dL 6.3  6.4  6.7   Total Bilirubin 0.3 - 1.2 mg/dL 0.4  0.4  0.5   Alkaline Phos 38 - 126 U/L 115  104  106   AST 15 - 41 U/L 67  59  80   ALT 0 - 44 U/L '10  10  12       '$ RADIOGRAPHIC STUDIES: I have personally reviewed the radiological images as listed and agreed with the findings in the report. No results found.    Orders Placed This Encounter  Procedures   Do not attempt resuscitation (DNR)    Order Specific Question:   If patient has no pulse and is not breathing    Answer:   Do Not Attempt Resuscitation    Order Specific Question:   If patient has a pulse and/or is breathing: Medical Treatment Goals    Answer:   MEDICAL INTERVENTIONS DESIRED: Use advanced airway interventions, mechanical ventilation or cardioversion in appropriate circumstances; Use medication/IV fluids as indicated; Provide comfort medications; Transfer to Progressive/Stepdown/ICU as indicated.    Order Specific Question:   Consent:    Answer:   Discussion documented in EHR or advanced directives reviewed   All questions were answered. The patient knows to call the clinic with any problems, questions or concerns. No barriers to learning was detected. The total time spent in the appointment was 40 minutes.     Truitt Merle, MD 09/05/2022

## 2022-09-05 NOTE — Patient Instructions (Signed)
Patrick [name]  Discharge Instructions: Thank you for choosing [facility] to provide your oncology and hematology care.   If you have a lab appointment with the [facility], please go directly to the [facility] and check in at the registration area.   Wear comfortable clothing and clothing appropriate for easy access to any Portacath or PICC line.   We strive to give you quality time with your provider. You may need to reschedule your appointment if you arrive late (15 or more minutes).  Arriving late affects you and other patients whose appointments are after yours.  Also, if you miss three or more appointments without notifying the office, you may be dismissed from the clinic at the provider's discretion.      For prescription refill requests, have your pharmacy contact our office and allow 72 hours for refills to be completed.    Today you received the following chemotherapy and/or immunotherapy agents Cabazitaxel      To help prevent nausea and vomiting after your treatment, we encourage you to take your nausea medication as directed.  BELOW ARE SYMPTOMS THAT SHOULD BE REPORTED IMMEDIATELY: *FEVER GREATER THAN 100.4 F (38 C) OR HIGHER *CHILLS OR SWEATING *NAUSEA AND VOMITING THAT IS NOT CONTROLLED WITH YOUR NAUSEA MEDICATION *UNUSUAL SHORTNESS OF BREATH *UNUSUAL BRUISING OR BLEEDING *URINARY PROBLEMS (pain or burning when urinating, or frequent urination) *BOWEL PROBLEMS (unusual diarrhea, constipation, pain near the anus) TENDERNESS IN MOUTH AND THROAT WITH OR WITHOUT PRESENCE OF ULCERS (sore throat, sores in mouth, or a toothache) UNUSUAL RASH, SWELLING OR PAIN  UNUSUAL VAGINAL DISCHARGE OR ITCHING   Items with * indicate a potential emergency and should be followed up as soon as possible or go to the Emergency Department if any problems should occur.  Please show the CHEMOTHERAPY ALERT CARD or IMMUNOTHERAPY ALERT CARD at  check-in to the Emergency Department and triage nurse.  Should you have questions after your visit or need to cancel or reschedule your appointment, please contact [facility]  Dept: [phone]  and follow the prompts.  Office hours are 8:00 a.m. to 4:30 p.m. Monday - Friday. Please note that voicemails left after 4:00 p.m. may not be returned until the following business day.  We are closed weekends and major holidays. You have access to a nurse at all times for urgent questions. Please call the main number to the clinic Dept: [phone] and follow the prompts.   For any non-urgent questions, you may also contact your provider using MyChart. We now offer e-Visits for anyone 27 and older to request care online for non-urgent symptoms. For details visit [email or web address].   Also download the MyChart app! Go to the app store, search "MyChart", open the app, select [facility], and log in with your MyChart username and password.  Cabazitaxel Injection What is this medication? CABAZITAXEL (ka BAZ i TAX el) treats prostate cancer. It works by slowing down the growth of cancer cells. This medicine may be used for other purposes; ask your health care provider or pharmacist if you have questions. COMMON BRAND NAME(S): Jevtana What should I tell my care team before I take this medication? They need to know if you have any of these conditions: Kidney problems Liver disease Low white blood cell levels Lung disease Stomach or intestine problems An unusual or allergic reaction to cabazitaxel, polysorbate 80, other medications, foods, dyes, or preservatives Pregnant or trying to get  pregnant Breast-feeding How should I use this medication? This medication is injected into a vein. It is given by your care team in a hospital or clinic setting. Talk to your care team about the use of this medication in children. Special care may be needed. Overdosage: If you  think you have taken too much of this medicine contact a poison control center or emergency room at once. NOTE: This medicine is only for you. Do not share this medicine with others. What if I miss a dose? Keep appointments for follow-up doses. It is important not to miss your dose. Call your care team if you are unable to keep an appointment. What may interact with this medication? Certain antibiotics, such as clarithromycin or telithromycin Certain antivirals for HIV or AIDS Certain medications for fungal infections like ketoconazole, itraconazole, and voriconazole Nefazodone This list may not describe all possible interactions. Give your health care provider a list of all the medicines, herbs, non-prescription drugs, or dietary supplements you use. Also tell them if you smoke, drink alcohol, or use illegal drugs. Some items may interact with your medicine. What should I watch for while using this medication? This medication may make you feel generally unwell. This is not uncommon as chemotherapy can affect healthy cells as well as cancer cells. Report any side effects. Continue your course of treatment even though you feel ill unless your care team tells you to stop. You may need blood work while you are taking this medication. This medication may increase your risk of getting an infection. Call your care team for advice if you get a fever, chills, sore throat, or other symptoms of a cold or flu. Do not treat yourself. Try to avoid being around people who are sick. Avoid taking medications that contain aspirin, acetaminophen, ibuprofen, naproxen, or ketoprofen unless instructed by your care team. These medications may hide a fever. Be careful brushing or flossing your teeth or using a toothpick because you may get an infection or bleed more easily. If you have any dental work done, tell your dentist you are receiving this medication. This medication can cause serious infusion reactions. To reduce  the risk, your care team may give you other medications to take before receiving this one. Be sure to follow the directions from your care team. Use a condom during sex while taking this medication and for 4 months after the last dose. Talk to your care team right away if your partner may be pregnant. This medication can cause serious birth defects. This medication may cause infertility. Talk to your care team if you are concerned about your fertility. What side effects may I notice from receiving this medication? Side effects that you should report to your care team as soon as possible: Allergic reactions--skin rash, itching, hives, swelling of the face, lips, tongue, or throat Diarrhea, nausea, vomiting Dry cough, shortness of breath or trouble breathing Infection--fever, chills, cough, or sore throat Kidney injury--decrease in the amount of urine, swelling of the ankles, hands, or feet Pain, tingling, or numbness in the hands or feet Red or dark brown urine Stomach bleeding--bloody or black, tar-like stools, vomiting blood or brown material that looks like coffee grounds Stomach pain that is severe, does not away, or gets worse Unusual bruising or bleeding Side effects that usually do not require medical attention (report these to your care team if they continue or are bothersome): Loss of appetite Unusual weakness or fatigue This list may not describe all possible side effects. Call  your doctor for medical advice about side effects. You may report side effects to FDA at 1-800-FDA-1088. Where should I keep my medication? This medication is given in a hospital or clinic. It will not be stored at home. NOTE: This sheet is a summary. It may not cover all possible information. If you have questions about this medicine, talk to your doctor, pharmacist, or health care provider.  2023 Elsevier/Gold Standard (2008-12-23 00:00:00) Blood Transfusion, Adult, Care After The following information  offers guidance on how to care for yourself after your procedure. Your health care provider may also give you more specific instructions. If you have problems or questions, contact your health care provider. What can I expect after the procedure? After the procedure, it is common to have: Bruising and soreness where the IV was inserted. A headache. Follow these instructions at home: IV insertion site care     Follow instructions from your health care provider about how to take care of your IV insertion site. Make sure you: Wash your hands with soap and water for at least 20 seconds before and after you change your bandage (dressing). If soap and water are not available, use hand sanitizer. Change your dressing as told by your health care provider. Check your IV insertion site every day for signs of infection. Check for: Redness, swelling, or pain. Bleeding from the site. Warmth. Pus or a bad smell. General instructions Take over-the-counter and prescription medicines only as told by your health care provider. Rest as told by your health care provider. Return to your normal activities as told by your health care provider. Keep all follow-up visits. Lab tests may need to be done at certain periods to recheck your blood counts. Contact a health care provider if: You have itching or red, swollen areas of skin (hives). You have a fever or chills. You have pain in the head, back, or chest. You feel anxious or you feel weak after doing your normal activities. You have redness, swelling, warmth, or pain around the IV insertion site. You have blood coming from the IV insertion site that does not stop with pressure. You have pus or a bad smell coming from your IV insertion site. If you received your blood transfusion in an outpatient setting, you will be told whom to contact to report any reactions. Get help right away if: You have symptoms of a serious allergic or immune system reaction,  including: Trouble breathing or shortness of breath. Swelling of the face, feeling flushed, or widespread rash. Dark urine or blood in the urine. Fast heartbeat. These symptoms may be an emergency. Get help right away. Call 911. Do not wait to see if the symptoms will go away. Do not drive yourself to the hospital. Summary Bruising and soreness around the IV insertion site are common. Check your IV insertion site every day for signs of infection. Rest as told by your health care provider. Return to your normal activities as told by your health care provider. Get help right away for symptoms of a serious allergic or immune system reaction to the blood transfusion. This information is not intended to replace advice given to you by your health care provider. Make sure you discuss any questions you have with your health care provider. Document Revised: 09/09/2021 Document Reviewed: 09/09/2021 Elsevier Patient Education  [place].

## 2022-09-06 ENCOUNTER — Other Ambulatory Visit (HOSPITAL_COMMUNITY): Payer: Self-pay

## 2022-09-06 ENCOUNTER — Telehealth: Payer: Self-pay

## 2022-09-06 LAB — TYPE AND SCREEN
ABO/RH(D): A NEG
Antibody Screen: NEGATIVE
Unit division: 0

## 2022-09-06 LAB — BPAM RBC
Blood Product Expiration Date: 202403302359
ISSUE DATE / TIME: 202403121317
Unit Type and Rh: 600

## 2022-09-06 NOTE — Telephone Encounter (Signed)
-----   Message from Odette Fraction, [place] sent at 09/05/2022  4:03 PM EDT ----- Regarding: First time Jevtana. Patient of Dr Burr Medico. First time Jevanta patient. Tolerated infusion well. Vitals stable at discharge. Requesting call back. Also received 1 unit of blood after chemotherapy. Not first time but tolerated this as well. Thanks!

## 2022-09-06 NOTE — Telephone Encounter (Signed)
Mr. Bynum states that he is doing fine.  He is eating, drinking, and urinating well.  He knows to call the office at [phone] if he has any questions or concerns. 

## 2022-09-07 ENCOUNTER — Ambulatory Visit: Payer: PPO

## 2022-09-08 ENCOUNTER — Other Ambulatory Visit: Payer: Self-pay

## 2022-09-08 ENCOUNTER — Inpatient Hospital Stay: Payer: PPO

## 2022-09-08 DIAGNOSIS — Z5111 Encounter for antineoplastic chemotherapy: Secondary | ICD-10-CM | POA: Diagnosis not present

## 2022-09-08 DIAGNOSIS — D649 Anemia, unspecified: Secondary | ICD-10-CM

## 2022-09-08 DIAGNOSIS — C7951 Secondary malignant neoplasm of bone: Secondary | ICD-10-CM

## 2022-09-08 LAB — CBC WITH DIFFERENTIAL (CANCER CENTER ONLY)
Abs Immature Granulocytes: 0.57 10*3/uL — ABNORMAL HIGH (ref 0.00–0.07)
Basophils Absolute: 0 10*3/uL (ref 0.0–0.1)
Basophils Relative: 1 %
Eosinophils Absolute: 0.1 10*3/uL (ref 0.0–0.5)
Eosinophils Relative: 1 %
HCT: 22.7 % — ABNORMAL LOW (ref 39.0–52.0)
Hemoglobin: 7.4 g/dL — ABNORMAL LOW (ref 13.0–17.0)
Immature Granulocytes: 7 %
Lymphocytes Relative: 13 %
Lymphs Abs: 1.1 10*3/uL (ref 0.7–4.0)
MCH: 32.2 pg (ref 26.0–34.0)
MCHC: 32.6 g/dL (ref 30.0–36.0)
MCV: 98.7 fL (ref 80.0–100.0)
Monocytes Absolute: 0.6 10*3/uL (ref 0.1–1.0)
Monocytes Relative: 7 %
Neutro Abs: 6.2 10*3/uL (ref 1.7–7.7)
Neutrophils Relative %: 71 %
Platelet Count: 56 10*3/uL — ABNORMAL LOW (ref 150–400)
RBC: 2.3 MIL/uL — ABNORMAL LOW (ref 4.22–5.81)
RDW: 17.2 % — ABNORMAL HIGH (ref 11.5–15.5)
WBC Count: 8.5 10*3/uL (ref 4.0–10.5)
nRBC: 0.6 % — ABNORMAL HIGH (ref 0.0–0.2)

## 2022-09-08 LAB — CMP (CANCER CENTER ONLY)
ALT: 19 U/L (ref 0–44)
AST: 84 U/L — ABNORMAL HIGH (ref 15–41)
Albumin: 4.1 g/dL (ref 3.5–5.0)
Alkaline Phosphatase: 115 U/L (ref 38–126)
Anion gap: 10 (ref 5–15)
BUN: 25 mg/dL — ABNORMAL HIGH (ref 8–23)
CO2: 22 mmol/L (ref 22–32)
Calcium: 9.2 mg/dL (ref 8.9–10.3)
Chloride: 105 mmol/L (ref 98–111)
Creatinine: 0.9 mg/dL (ref 0.61–1.24)
GFR, Estimated: >60 mL/min (ref >60)
Glucose, Bld: 164 mg/dL — ABNORMAL HIGH (ref 70–99)
Potassium: 4.1 mmol/L (ref 3.5–5.1)
Sodium: 137 mmol/L (ref 135–145)
Total Bilirubin: 0.6 mg/dL (ref 0.3–1.2)
Total Protein: 6.2 g/dL — ABNORMAL LOW (ref 6.5–8.1)

## 2022-09-08 LAB — SAMPLE TO BLOOD BANK

## 2022-09-08 LAB — PREPARE RBC (CROSSMATCH)

## 2022-09-09 ENCOUNTER — Inpatient Hospital Stay: Payer: PPO

## 2022-09-09 VITALS — BP 93/50 | HR 96 | Temp 98.1°F | Resp 18

## 2022-09-09 DIAGNOSIS — D649 Anemia, unspecified: Secondary | ICD-10-CM

## 2022-09-09 DIAGNOSIS — Z5111 Encounter for antineoplastic chemotherapy: Secondary | ICD-10-CM | POA: Diagnosis not present

## 2022-09-09 DIAGNOSIS — C61 Malignant neoplasm of prostate: Secondary | ICD-10-CM

## 2022-09-09 MED ORDER — PEGFILGRASTIM-CBQV 6 MG/0.6ML ~~LOC~~ SOSY
6.0000 mg | PREFILLED_SYRINGE | Freq: Once | SUBCUTANEOUS | Status: AC
  Administered 2022-09-09: 6 mg via SUBCUTANEOUS
  Filled 2022-09-09: qty 0.6

## 2022-09-09 MED ORDER — SODIUM CHLORIDE 0.9% IV SOLUTION
250.0000 mL | Freq: Once | INTRAVENOUS | Status: AC
  Administered 2022-09-09: 250 mL via INTRAVENOUS

## 2022-09-09 NOTE — Patient Instructions (Signed)
Blood Transfusion, Adult, Care After The following information offers guidance on how to care for yourself after your procedure. Your health care provider may also give you more specific instructions. If you have problems or questions, contact your health care provider. What can I expect after the procedure? After the procedure, it is common to have: Bruising and soreness where the IV was inserted. A headache. Follow these instructions at home: IV insertion site care     Follow instructions from your health care provider about how to take care of your IV insertion site. Make sure you: Wash your hands with soap and water for at least 20 seconds before and after you change your bandage (dressing). If soap and water are not available, use hand sanitizer. Change your dressing as told by your health care provider. Check your IV insertion site every day for signs of infection. Check for: Redness, swelling, or pain. Bleeding from the site. Warmth. Pus or a bad smell. General instructions Take over-the-counter and prescription medicines only as told by your health care provider. Rest as told by your health care provider. Return to your normal activities as told by your health care provider. Keep all follow-up visits. Lab tests may need to be done at certain periods to recheck your blood counts. Contact a health care provider if: You have itching or red, swollen areas of skin (hives). You have a fever or chills. You have pain in the head, back, or chest. You feel anxious or you feel weak after doing your normal activities. You have redness, swelling, warmth, or pain around the IV insertion site. You have blood coming from the IV insertion site that does not stop with pressure. You have pus or a bad smell coming from your IV insertion site. If you received your blood transfusion in an outpatient setting, you will be told whom to contact to report any reactions. Get help right away if: You  have symptoms of a serious allergic or immune system reaction, including: Trouble breathing or shortness of breath. Swelling of the face, feeling flushed, or widespread rash. Dark urine or blood in the urine. Fast heartbeat. These symptoms may be an emergency. Get help right away. Call 911. Do not wait to see if the symptoms will go away. Do not drive yourself to the hospital. Summary Bruising and soreness around the IV insertion site are common. Check your IV insertion site every day for signs of infection. Rest as told by your health care provider. Return to your normal activities as told by your health care provider. Get help right away for symptoms of a serious allergic or immune system reaction to the blood transfusion. This information is not intended to replace advice given to you by your health care provider. Make sure you discuss any questions you have with your health care provider. Document Revised: 09/09/2021 Document Reviewed: 09/09/2021 Elsevier Patient Education  2023 Elsevier Inc.  

## 2022-09-10 LAB — TYPE AND SCREEN
ABO/RH(D): A NEG
Antibody Screen: NEGATIVE
Unit division: 0

## 2022-09-10 LAB — BPAM RBC
Blood Product Expiration Date: 202403312359
ISSUE DATE / TIME: 202403160820
Unit Type and Rh: 600

## 2022-09-11 NOTE — Assessment & Plan Note (Signed)
-  he reports worsening diffuse bone and joint pain when he had cancer progression in Feb 2024 -He is on gabapentin.  He recently started oxycodone 5 mg as needed which helps his pain control -he states his pain overall is controlled

## 2022-09-11 NOTE — Assessment & Plan Note (Signed)
-  continue meds and f/u with ID   

## 2022-09-11 NOTE — Assessment & Plan Note (Signed)
-  Stage IV with diffuse bone metastasis.  Diagnosed in 06/2021, castration resistant disease. -initial PSA>3000 --he started casodex on 1/10, and Lupron and Zometa on 07/06/21 in the hospital, and Zytiga 1000mg  with prednisone 10mg  in mid 07/2021 -his PSA initially responded well, coming down to 0.5 on 10/26/21. Unfortunately, his PSA rose to 19.3 on 01/19/22 and 99.6 on 02/13/22. -PSMA PET scan on 03/23/22 confirmed widespread intensely radiotracer-avid skeletal metastasis involving axillary and appendicular skeleton; no abnormal activity in prostate gland or lymph nodes. -his Zytiga was switched to chemo doxetaxel on 03/31/22, he had partial response, but progressed after cycle 7 -due to rapid worsening anemia and thrombocytopenia from bone mets, I started him on cabazitaxel on 09/05/22.

## 2022-09-11 NOTE — Assessment & Plan Note (Signed)
-  Secondary to bone metastasis, previously responded to chemotherapy docetaxel -He has developed thrombocytopenia again due to the disease progression 

## 2022-09-11 NOTE — Progress Notes (Unsigned)
[place]   [phone] [phone]   Clinic Follow up Note   Patient Care Team: [name], MD as PCP - General [name] [name], MD as PCP - Infectious Diseases (Infectious Diseases) Katheren Puller, RN as Oncology Nurse Navigator Truitt Merle, MD as Attending Physician (Hematology and Oncology)  Date of Service:  09/11/2022  CHIEF COMPLAINT: f/u of metastatic prostate cancer    CURRENT THERAPY:  Zometa Q12WK Eligard 45mg  q6h Cabazitaxel D1+Prednisone D1-21 q12d    ASSESSMENT: *** Patrick [name] is a 72 y.o. male with   No problem-specific Assessment & Plan notes found for this encounter.  ***   PLAN:   SUMMARY OF ONCOLOGIC HISTORY: Oncology History Overview Note   Cancer Staging  Prostate cancer metastatic to bone [facility]) Staging form: Prostate, AJCC 8th Edition - Clinical stage from 07/01/2021: Stage IVB (cTX, cNX, pM1b) - Signed by Truitt Merle, MD on 07/05/2021    Prostate cancer metastatic to bone [facility])  06/30/2021 Tumor Marker   Patient's tumor was tested for the following markers: PSA. Results of the tumor marker test revealed >3,000.   07/01/2021 Cancer Staging   Staging form: Prostate, AJCC 8th Edition - Clinical stage from 07/01/2021: Stage IVB (cTX, cNX, pM1b) - Signed by Truitt Merle, MD on 07/05/2021   07/01/2021 Imaging   EXAM: CT CHEST, ABDOMEN, AND PELVIS WITH CONTRAST  IMPRESSION: Widespread osseous metastatic disease of unknown primary. Numerous pathologic fractures involving the ribs bilaterally as well as the T11, T12, L2, L3, and L4 vertebral bodies. Retropulsion of T11 and L3 results in severe central canal stenosis at L3. Multifactorial severe central canal stenosis also noted at L4, in part related to metastatic disease. Prominent soft tissue component involving the metastasis involving the spinous process of L5 may provide an appropriate target for tissue sampling for further evaluation.   Mild coronary artery  calcification.   Thoracic aortic aneurysm with maximal transaxial dimension of 4.9 cm. Ascending thoracic aortic aneurysm. Recommend semi-annual imaging followup by CTA or MRA and referral to cardiothoracic surgery if not already obtained. This recommendation follows 2010 ACCF/AHA/AATS/ACR/ASA/SCA/SCAI/SIR/STS/SVM Guidelines for the Diagnosis and Management of Patients With Thoracic Aortic Disease. Circulation. 2010; 121ML:4928372. Aortic aneurysm NOS (ICD10-I71.9)   Moderate enlargement of the prostate gland. Correlation with serum PSA may be helpful.   07/01/2021 Imaging   EXAM: MRI HEAD WITHOUT CONTRAST  IMPRESSION: 1. No evidence of intracranial metastases on this noncontrast study. 2. Diffusely abnormal bone marrow signal consistent with known widespread osseous metastases.   07/01/2021 Pathology Results   FINAL MICROSCOPIC DIAGNOSIS:   A. SOFT TISSUE MASS, L5, NEEDLE CORE BIOPSY:  -  Metastatic carcinoma  -  See comment   COMMENT:  By immunohistochemistry, the neoplastic cells are positive for PSA and prostein but negative for cytokeratin 7, cytokeratin 20, CDX2 and TTF-1. Overall, the immunoprofile is consistent with a prostatic primary.   07/02/2021 Initial Diagnosis   Prostate cancer metastatic to bone ([place])   07/21/2021 - 08/03/2021 Radiation Therapy   The painful site of metastasis at L5 was treated to 30 Gy in 10 fractions of 3 Gy each.   08/03/2021 Tumor Marker   Patient's tumor was tested for the following markers: PSA. Results of the tumor marker test revealed 76.8.   03/31/2022 - 08/14/2022 Chemotherapy   Patient is on Treatment Plan : PROSTATE Docetaxel (74) + Prednisone q21d     04/30/2022 Genetic Testing   Negative genetic testing on the CancerNext-Expanded+RNAinsight panel.  The  report date is April 30, 2022.  The CancerNext-Expanded gene panel offered by [facility] and includes sequencing and rearrangement analysis for the following 77 genes: AIP, ALK, APC*,  ATM*, AXIN2, BAP1, BARD1, BLM, BMPR1A, BRCA1*, BRCA2*, BRIP1*, CDC73, CDH1*, CDK4, CDKN1B, CDKN2A, CHEK2*, CTNNA1, DICER1, FANCC, FH, FLCN, GALNT12, KIF1B, LZTR1, MAX, MEN1, MET, MLH1*, MSH2*, MSH3, MSH6*, MUTYH*, NBN, NF1*, NF2, NTHL1, PALB2*, PHOX2B, PMS2*, POT1, PRKAR1A, PTCH1, PTEN*, RAD51C*, RAD51D*, RB1, RECQL, RET, SDHA, SDHAF2, SDHB, SDHC, SDHD, SMAD4, SMARCA4, SMARCB1, SMARCE1, STK11, SUFU, TMEM127, TP53*, TSC1, TSC2, VHL and XRCC2 (sequencing and deletion/duplication); EGFR, EGLN1, HOXB13, KIT, MITF, PDGFRA, POLD1, and POLE (sequencing only); EPCAM and GREM1 (deletion/duplication only). DNA and RNA analyses performed for * genes.    08/25/2022 PET scan    IMPRESSION: 1. New subtle lesion in the dome of the RIGHT hepatic lobe is concerning for prostate cancer liver metastasis. This could be confirmed with contrast MRI if clinically relevant. 2. Mild progression previously demonstrated widespread sclerotic radiotracer avid skeletal metastasis. No interval improvement from prior PSMA PET scan. 3. No metastatic lymphadenopathy identified. No pulmonary metastasis.   09/05/2022 -  Chemotherapy   Patient is on Treatment Plan : PROSTATE Cabazitaxel (20) D1 + Prednisone D1-21 q21d        INTERVAL HISTORY: *** Patrick [name] is here for a follow up of metastatic prostate cancer   He was last seen by me on 09/05/2022 He presents to the clinic      All other systems were reviewed with the patient and are negative.  MEDICAL HISTORY:  Past Medical History:  Diagnosis Date   Allergy    seasonal   Arthritis    r knee   H/O inguinal hernia repair    History of tobacco use    HIV infection ([place])    Hyperlipidemia    Hypertension    Strabismic amblyopia     SURGICAL HISTORY: Past Surgical History:  Procedure Laterality Date   EYE SURGERY     strabismus   HERNIA REPAIR     IR BONE TUMOR(S)RF ABLATION  10/18/2021   IR BONE TUMOR(S)RF ABLATION  10/18/2021   IR KYPHO EA ADDL LEVEL  THORACIC OR LUMBAR  10/18/2021   IR KYPHO LUMBAR INC FX REDUCE BONE BX UNI/BIL CANNULATION INC/IMAGING  10/18/2021    I have reviewed the social history and family history with the patient and they are unchanged from previous note.  ALLERGIES:  has No Known Allergies.  MEDICATIONS:  Current Outpatient Medications  Medication Sig Dispense Refill   albuterol (VENTOLIN HFA) 108 (90 Base) MCG/ACT inhaler Inhale 2 puffs into the lungs every 6 (six) hours as needed for shortness of breath or wheezing.     amLODipine (NORVASC) 5 MG tablet Take 1 tablet (5 mg total) by mouth daily. 30 tablet 11   atorvastatin (LIPITOR) 40 MG tablet TAKE 1 TABLET(40 MG) BY MOUTH DAILY 90 tablet 2   calcium citrate-vitamin D (CITRACAL+D) 315-200 MG-UNIT tablet Take 1 tablet by mouth daily.     celecoxib (CELEBREX) 200 MG capsule Take 1 capsule (200 mg total) by mouth 2 (two) times daily. 30 capsule 1   emtricitabine-rilpivir-tenofovir AF (ODEFSEY) 200-25-25 MG TABS tablet Take 1 tablet by mouth daily. 90 tablet 3   gabapentin (NEURONTIN) 300 MG capsule TAKE 1 CAPSULE(300 MG) BY MOUTH THREE TIMES DAILY 90 capsule 1   lidocaine (LIDODERM) 5 % Place 1 patch onto the skin daily. Remove & Discard patch within 12 hours or as directed by  MD 30 patch 0   Multiple Vitamins-Minerals (MULTIVITAMIN WITH MINERALS) tablet Take 1 tablet by mouth daily.     oxyCODONE (OXY IR/ROXICODONE) 5 MG immediate release tablet Take 1 tablet (5 mg total) by mouth every 6 (six) hours as needed for severe pain. 60 tablet 0   predniSONE (DELTASONE) 10 MG tablet Take 1 tablet (10 mg total) by mouth daily. 21 tablet 3   No current facility-administered medications for this visit.    PHYSICAL EXAMINATION: ECOG PERFORMANCE STATUS: {CHL ONC ECOG PS:[id number]}  There were no vitals filed for this visit. Wt Readings from Last 3 Encounters:  09/05/22 165 lb 6.4 oz (75 kg)  07/21/22 177 lb 9.6 oz (80.6 kg)  06/30/22 178 lb 9.6 oz (81 kg)    {Only  keep what was examined. If exam not performed, can use .CEXAM } GENERAL:alert, no distress and comfortable SKIN: skin color, texture, turgor are normal, no rashes or significant lesions EYES: normal, Conjunctiva are pink and non-injected, sclera clear {OROPHARYNX:no exudate, no erythema and lips, buccal mucosa, and tongue normal}  NECK: supple, thyroid normal size, non-tender, without nodularity LYMPH:  no palpable lymphadenopathy in the cervical, axillary {or inguinal} LUNGS: clear to auscultation and percussion with normal breathing effort HEART: regular rate & rhythm and no murmurs and no lower extremity edema ABDOMEN:abdomen soft, non-tender and normal bowel sounds Musculoskeletal:no cyanosis of digits and no clubbing  NEURO: alert & oriented x 3 with fluent speech, no focal motor/sensory deficits  LABORATORY DATA:  I have reviewed the data as listed    Latest Ref Rng & Units 09/08/2022    1:22 PM 09/05/2022    8:22 AM 09/01/2022   11:50 AM  CBC  WBC 4.0 - 10.5 K/uL 8.5  10.6  10.9   Hemoglobin 13.0 - 17.0 g/dL 7.4  7.0  7.1   Hematocrit 39.0 - 52.0 % 22.7  21.1  21.6   Platelets 150 - 400 K/uL 56  67  61         Latest Ref Rng & Units 09/08/2022    1:22 PM 09/05/2022    8:22 AM 09/01/2022   11:50 AM  CMP  Glucose 70 - 99 mg/dL 164  128  113   BUN 8 - 23 mg/dL 25  32  26   Creatinine 0.61 - 1.24 mg/dL 0.90  1.27  0.94   Sodium 135 - 145 mmol/L 137  135  135   Potassium 3.5 - 5.1 mmol/L 4.1  4.6  4.8   Chloride 98 - 111 mmol/L 105  102  102   CO2 22 - 32 mmol/L 22  21  23    Calcium 8.9 - 10.3 mg/dL 9.2  9.3  9.4   Total Protein 6.5 - 8.1 g/dL 6.2  6.3  6.4   Total Bilirubin 0.3 - 1.2 mg/dL 0.6  0.4  0.4   Alkaline Phos 38 - 126 U/L 115  115  104   AST 15 - 41 U/L 84  67  59   ALT 0 - 44 U/L 19  10  10        RADIOGRAPHIC STUDIES: I have personally reviewed the radiological images as listed and agreed with the findings in the report. No results found.    No orders of  the defined types were placed in this encounter.  All questions were answered. The patient knows to call the clinic with any problems, questions or concerns. No barriers to learning was detected. The total time spent  in the appointment was {CHL ONC TIME VISIT - [id number].     Baldemar Friday, CMA 09/11/2022   I, Audry Riles, CMA, am acting as scribe for Truitt Merle, MD.   {Add scribe attestation statement}

## 2022-09-12 ENCOUNTER — Ambulatory Visit: Payer: PPO

## 2022-09-12 ENCOUNTER — Inpatient Hospital Stay: Payer: PPO

## 2022-09-12 ENCOUNTER — Inpatient Hospital Stay (HOSPITAL_BASED_OUTPATIENT_CLINIC_OR_DEPARTMENT_OTHER): Payer: PPO | Admitting: Hematology

## 2022-09-12 ENCOUNTER — Other Ambulatory Visit: Payer: Self-pay

## 2022-09-12 ENCOUNTER — Encounter: Payer: Self-pay | Admitting: Hematology

## 2022-09-12 DIAGNOSIS — C7951 Secondary malignant neoplasm of bone: Secondary | ICD-10-CM | POA: Diagnosis not present

## 2022-09-12 DIAGNOSIS — D696 Thrombocytopenia, unspecified: Secondary | ICD-10-CM

## 2022-09-12 DIAGNOSIS — B20 Human immunodeficiency virus [HIV] disease: Secondary | ICD-10-CM

## 2022-09-12 DIAGNOSIS — C61 Malignant neoplasm of prostate: Secondary | ICD-10-CM | POA: Diagnosis not present

## 2022-09-12 DIAGNOSIS — G893 Neoplasm related pain (acute) (chronic): Secondary | ICD-10-CM | POA: Diagnosis not present

## 2022-09-12 NOTE — Progress Notes (Signed)
Referral for Palliative Care sent to [facility].  Margaretmary Eddy, RN with [facility] made aware of the new patient referral.  Olivia Mackie stated she will process the paperwork and notify her admissions team.

## 2022-09-12 NOTE — Progress Notes (Signed)
[facility]   [phone] [phone]   Clinic Follow up Note   Patient Care Team: [name], MD as PCP - General [name] [name], MD as PCP - Infectious Diseases (Infectious Diseases) Katheren Puller, RN as Oncology Nurse Navigator Truitt Merle, MD as Attending Physician (Hematology and Oncology)  Date of Service:  09/12/2022  I connected with Patrick [name] on 09/12/2022 at  9:30 AM EDT by telephone visit and verified that I am speaking with the correct person using two identifiers.  I discussed the limitations, risks, security and privacy concerns of performing an evaluation and management service by telephone and the availability of in person appointments. I also discussed with the patient that there may be a patient responsible charge related to this service. The patient expressed understanding and agreed to proceed.   Other persons participating in the visit and their role in the encounter:  no  Patient's location:  Home Provider's location:  Office  CHIEF COMPLAINT: f/u of metastatic prostate cancer   CURRENT THERAPY:  Zometa Q12WK Eligard 45mg  q6h Cabazitaxel every 3 weeks   ASSESSMENT & PLAN:  Patrick [name] is a 72 y.o. 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 -initial PSA>3000 --he started casodex on 1/10, and Lupron and Zometa on 07/06/21 in the hospital, and Zytiga 1000mg  with prednisone 10mg  in mid 07/2021 -his PSA initially responded well, coming down to 0.5 on 10/26/21. Unfortunately, his PSA rose to 19.3 on 01/19/22 and 99.6 on 02/13/22. -PSMA PET scan on 03/23/22 confirmed widespread intensely radiotracer-avid skeletal metastasis involving axillary and appendicular skeleton; no abnormal activity in prostate gland or lymph nodes. -his Zytiga was switched to chemo doxetaxel on 03/31/22, he had partial response, but progressed  after cycle 7 -due to rapid worsening anemia and thrombocytopenia from bone mets, I started him on cabazitaxel on 09/05/22.  -Patient is overall doing poorly at home, more fatigued, dyspneic, not able to do all self-care.  Strongly recommend palliative home care, finally agreed.  I will refer him -He lives alone, I again offered a call to his mother or his sister, he declined.  He said he will speak to his mother about his medical condition.  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  He recently started oxycodone 5 mg as needed which helps his pain control -he states his pain overall is controlled   PLAN: - Referral to palliative home care. -will schedule lab and blood transfusion next week  --lab/flush and f/u and treatment 09/26/2022   SUMMARY OF ONCOLOGIC HISTORY: Oncology History Overview Note   Cancer Staging  Prostate cancer metastatic to bone [facility]) Staging form: Prostate, AJCC 8th Edition - Clinical stage from 07/01/2021: Stage IVB (cTX, cNX, pM1b) - Signed by Truitt Merle, MD on 07/05/2021    Prostate cancer metastatic to bone [facility])  06/30/2021 Tumor Marker   Patient's tumor was tested for the following markers: PSA. Results of the tumor marker test revealed >3,000.   07/01/2021 Cancer Staging   Staging form: Prostate, AJCC 8th Edition - Clinical stage from  07/01/2021: Stage IVB (cTX, cNX, pM1b) - Signed by Truitt Merle, MD on 07/05/2021   07/01/2021 Imaging   EXAM: CT CHEST, ABDOMEN, AND PELVIS WITH CONTRAST  IMPRESSION: Widespread osseous metastatic disease of unknown primary. Numerous pathologic fractures involving the ribs  bilaterally as well as the T11, T12, L2, L3, and L4 vertebral bodies. Retropulsion of T11 and L3 results in severe central canal stenosis at L3. Multifactorial severe central canal stenosis also noted at L4, in part related to metastatic disease. Prominent soft tissue component involving the metastasis involving the spinous process of L5 may provide an appropriate target for tissue sampling for further evaluation.   Mild coronary artery calcification.   Thoracic aortic aneurysm with maximal transaxial dimension of 4.9 cm. Ascending thoracic aortic aneurysm. Recommend semi-annual imaging followup by CTA or MRA and referral to cardiothoracic surgery if not already obtained. This recommendation follows 2010 ACCF/AHA/AATS/ACR/ASA/SCA/SCAI/SIR/STS/SVM Guidelines for the Diagnosis and Management of Patients With Thoracic Aortic Disease. Circulation. 2010; 121ML:4928372. Aortic aneurysm NOS (ICD10-I71.9)   Moderate enlargement of the prostate gland. Correlation with serum PSA may be helpful.   07/01/2021 Imaging   EXAM: MRI HEAD WITHOUT CONTRAST  IMPRESSION: 1. No evidence of intracranial metastases on this noncontrast study. 2. Diffusely abnormal bone marrow signal consistent with known widespread osseous metastases.   07/01/2021 Pathology Results   FINAL MICROSCOPIC DIAGNOSIS:   A. SOFT TISSUE MASS, L5, NEEDLE CORE BIOPSY:  -  Metastatic carcinoma  -  See comment   COMMENT:  By immunohistochemistry, the neoplastic cells are positive for PSA and prostein but negative for cytokeratin 7, cytokeratin 20, CDX2 and TTF-1. Overall, the immunoprofile is consistent with a prostatic primary.   07/02/2021 Initial Diagnosis   Prostate cancer metastatic to bone ([place])   07/21/2021 - 08/03/2021 Radiation Therapy   The painful site of metastasis at L5 was treated to 30 Gy in 10 fractions of 3 Gy each.   08/03/2021 Tumor Marker   Patient's tumor was tested for the following markers: PSA. Results of the tumor  marker test revealed 76.8.   03/31/2022 - 08/14/2022 Chemotherapy   Patient is on Treatment Plan : PROSTATE Docetaxel (74) + Prednisone q21d     04/30/2022 Genetic Testing   Negative genetic testing on the CancerNext-Expanded+RNAinsight panel.  The report date is April 30, 2022.  The CancerNext-Expanded gene panel offered by [facility] and includes sequencing and rearrangement analysis for the following 77 genes: AIP, ALK, APC*, ATM*, AXIN2, BAP1, BARD1, BLM, BMPR1A, BRCA1*, BRCA2*, BRIP1*, CDC73, CDH1*, CDK4, CDKN1B, CDKN2A, CHEK2*, CTNNA1, DICER1, FANCC, FH, FLCN, GALNT12, KIF1B, LZTR1, MAX, MEN1, MET, MLH1*, MSH2*, MSH3, MSH6*, MUTYH*, NBN, NF1*, NF2, NTHL1, PALB2*, PHOX2B, PMS2*, POT1, PRKAR1A, PTCH1, PTEN*, RAD51C*, RAD51D*, RB1, RECQL, RET, SDHA, SDHAF2, SDHB, SDHC, SDHD, SMAD4, SMARCA4, SMARCB1, SMARCE1, STK11, SUFU, TMEM127, TP53*, TSC1, TSC2, VHL and XRCC2 (sequencing and deletion/duplication); EGFR, EGLN1, HOXB13, KIT, MITF, PDGFRA, POLD1, and POLE (sequencing only); EPCAM and GREM1 (deletion/duplication only). DNA and RNA analyses performed for * genes.    08/25/2022 PET scan    IMPRESSION: 1. New subtle lesion in the dome of the RIGHT hepatic lobe is concerning for prostate cancer liver metastasis. This could be confirmed with contrast MRI if clinically relevant. 2. Mild progression previously demonstrated widespread sclerotic radiotracer avid skeletal metastasis. No interval improvement from prior PSMA PET scan. 3. No metastatic lymphadenopathy identified. No pulmonary metastasis.   09/05/2022 -  Chemotherapy   Patient is on Treatment Plan : PROSTATE Cabazitaxel (20) D1 + Prednisone D1-21 q21d  INTERVAL HISTORY:  Patrick [name] was contacted for a follow up of metastatic prostate cancer . He was last seen by me on 09/05/2022.  Pt state that he has SOB and had some nausea. Pt states his pain was bad, because he forgot to take the pain pills.  Pt state that he does not  feel like getting around and do things. Pt verbalize him and sister don't really speak often, but a neighbor comes and helps from time to time. Pt report to put His mom as point of contact.   All other systems were reviewed with the patient and are negative.  MEDICAL HISTORY:  Past Medical History:  Diagnosis Date   Allergy    seasonal   Arthritis    r knee   H/O inguinal hernia repair    History of tobacco use    HIV infection ([place])    Hyperlipidemia    Hypertension    Strabismic amblyopia     SURGICAL HISTORY: Past Surgical History:  Procedure Laterality Date   EYE SURGERY     strabismus   HERNIA REPAIR     IR BONE TUMOR(S)RF ABLATION  10/18/2021   IR BONE TUMOR(S)RF ABLATION  10/18/2021   IR KYPHO EA ADDL LEVEL THORACIC OR LUMBAR  10/18/2021   IR KYPHO LUMBAR INC FX REDUCE BONE BX UNI/BIL CANNULATION INC/IMAGING  10/18/2021    I have reviewed the social history and family history with the patient and they are unchanged from previous note.  ALLERGIES:  has No Known Allergies.  MEDICATIONS:  Current Outpatient Medications  Medication Sig Dispense Refill   albuterol (VENTOLIN HFA) 108 (90 Base) MCG/ACT inhaler Inhale 2 puffs into the lungs every 6 (six) hours as needed for shortness of breath or wheezing.     amLODipine (NORVASC) 5 MG tablet Take 1 tablet (5 mg total) by mouth daily. 30 tablet 11   atorvastatin (LIPITOR) 40 MG tablet TAKE 1 TABLET(40 MG) BY MOUTH DAILY 90 tablet 2   calcium citrate-vitamin D (CITRACAL+D) 315-200 MG-UNIT tablet Take 1 tablet by mouth daily.     celecoxib (CELEBREX) 200 MG capsule Take 1 capsule (200 mg total) by mouth 2 (two) times daily. 30 capsule 1   emtricitabine-rilpivir-tenofovir AF (ODEFSEY) 200-25-25 MG TABS tablet Take 1 tablet by mouth daily. 90 tablet 3   gabapentin (NEURONTIN) 300 MG capsule TAKE 1 CAPSULE(300 MG) BY MOUTH THREE TIMES DAILY 90 capsule 1   lidocaine (LIDODERM) 5 % Place 1 patch onto the skin daily. Remove & Discard  patch within 12 hours or as directed by MD 30 patch 0   Multiple Vitamins-Minerals (MULTIVITAMIN WITH MINERALS) tablet Take 1 tablet by mouth daily.     oxyCODONE (OXY IR/ROXICODONE) 5 MG immediate release tablet Take 1 tablet (5 mg total) by mouth every 6 (six) hours as needed for severe pain. 60 tablet 0   predniSONE (DELTASONE) 10 MG tablet Take 1 tablet (10 mg total) by mouth daily. 21 tablet 3   No current facility-administered medications for this visit.    PHYSICAL EXAMINATION: ECOG PERFORMANCE STATUS: 3 - Symptomatic, >50% confined to bed  There were no vitals filed for this visit. Wt Readings from Last 3 Encounters:  09/05/22 165 lb 6.4 oz (75 kg)  07/21/22 177 lb 9.6 oz (80.6 kg)  06/30/22 178 lb 9.6 oz (81 kg)     No vitals taken today, Exam not performed today  LABORATORY DATA:  I have reviewed the data as listed  Latest Ref Rng & Units 09/08/2022    1:22 PM 09/05/2022    8:22 AM 09/01/2022   11:50 AM  CBC  WBC 4.0 - 10.5 K/uL 8.5  10.6  10.9   Hemoglobin 13.0 - 17.0 g/dL 7.4  7.0  7.1   Hematocrit 39.0 - 52.0 % 22.7  21.1  21.6   Platelets 150 - 400 K/uL 56  67  61         Latest Ref Rng & Units 09/08/2022    1:22 PM 09/05/2022    8:22 AM 09/01/2022   11:50 AM  CMP  Glucose 70 - 99 mg/dL 164  128  113   BUN 8 - 23 mg/dL 25  32  26   Creatinine 0.61 - 1.24 mg/dL 0.90  1.27  0.94   Sodium 135 - 145 mmol/L 137  135  135   Potassium 3.5 - 5.1 mmol/L 4.1  4.6  4.8   Chloride 98 - 111 mmol/L 105  102  102   CO2 22 - 32 mmol/L 22  21  23    Calcium 8.9 - 10.3 mg/dL 9.2  9.3  9.4   Total Protein 6.5 - 8.1 g/dL 6.2  6.3  6.4   Total Bilirubin 0.3 - 1.2 mg/dL 0.6  0.4  0.4   Alkaline Phos 38 - 126 U/L 115  115  104   AST 15 - 41 U/L 84  67  59   ALT 0 - 44 U/L 19  10  10        RADIOGRAPHIC STUDIES: I have personally reviewed the radiological images as listed and agreed with the findings in the report. No results found.    Orders Placed This Encounter   Procedures   Amb Referral to Palliative Care    Referral Priority:   Urgent    Referral Type:   Consultation    Referral Reason:   Symptom Managment    Number of Visits Requested:   1   All questions were answered. The patient knows to call the clinic with any problems, questions or concerns. No barriers to learning was detected. The total time spent in the appointment was 15 minutes.     Truitt Merle, MD 09/12/2022   Felicity Coyer am acting as scribe for Truitt Merle, MD.   I have reviewed the above documentation for accuracy and completeness, and I agree with the above.

## 2022-09-13 ENCOUNTER — Other Ambulatory Visit: Payer: PPO

## 2022-09-13 ENCOUNTER — Inpatient Hospital Stay: Payer: PPO | Admitting: Licensed Clinical Social Worker

## 2022-09-13 ENCOUNTER — Ambulatory Visit: Payer: PPO | Admitting: Hematology

## 2022-09-13 ENCOUNTER — Other Ambulatory Visit: Payer: Self-pay | Admitting: *Deleted

## 2022-09-13 ENCOUNTER — Telehealth: Payer: Self-pay | Admitting: Hematology

## 2022-09-13 DIAGNOSIS — B20 Human immunodeficiency virus [HIV] disease: Secondary | ICD-10-CM

## 2022-09-13 DIAGNOSIS — E7800 Pure hypercholesterolemia, unspecified: Secondary | ICD-10-CM

## 2022-09-13 DIAGNOSIS — Z113 Encounter for screening for infections with a predominantly sexual mode of transmission: Secondary | ICD-10-CM | POA: Diagnosis not present

## 2022-09-13 DIAGNOSIS — D649 Anemia, unspecified: Secondary | ICD-10-CM

## 2022-09-13 DIAGNOSIS — C61 Malignant neoplasm of prostate: Secondary | ICD-10-CM

## 2022-09-13 NOTE — Telephone Encounter (Signed)
Contacted patient to scheduled appointments. Left message with appointment details and a call back number if patient had any questions or could not accommodate the time we provided.   

## 2022-09-13 NOTE — Addendum Note (Signed)
Addended by: Truddie Crumble on: 09/13/2022 04:43 PM   Modules accepted: Orders

## 2022-09-13 NOTE — Progress Notes (Signed)
[facility] CSW Progress Note  Clinical Education officer, museum contacted patient by phone to discuss questions regarding completing advance directives.  Pt booked for advance directives clinic on 3/22 and will check with [facility] to see if they are able to complete the packet at home.  If it can be completed at home pt will call to cancel his appointment; otherwise, the documents will be completed at clinic.  CSW to remain available as appropriate to provide support.      Henriette Combs, LCSW

## 2022-09-13 NOTE — Addendum Note (Signed)
Addended by: Truddie Crumble on: 09/13/2022 04:47 PM   Modules accepted: Orders

## 2022-09-13 NOTE — Addendum Note (Signed)
Addended by: Truddie Crumble on: 09/13/2022 04:45 PM   Modules accepted: Orders

## 2022-09-14 ENCOUNTER — Telehealth: Payer: Self-pay

## 2022-09-14 ENCOUNTER — Telehealth: Payer: Self-pay | Admitting: Infectious Diseases

## 2022-09-14 ENCOUNTER — Other Ambulatory Visit (HOSPITAL_COMMUNITY): Payer: Self-pay

## 2022-09-14 ENCOUNTER — Other Ambulatory Visit: Payer: PPO

## 2022-09-14 ENCOUNTER — Ambulatory Visit: Payer: PPO

## 2022-09-14 ENCOUNTER — Encounter: Payer: Self-pay | Admitting: Hematology

## 2022-09-14 DIAGNOSIS — Z515 Encounter for palliative care: Secondary | ICD-10-CM

## 2022-09-14 LAB — T-HELPER CELL (CD4) - (RCID CLINIC ONLY)
CD4 % Helper T Cell: 21 % — ABNORMAL LOW (ref 33–65)
CD4 T Cell Abs: 480 /uL (ref 400–1790)

## 2022-09-14 MED ORDER — ODEFSEY 200-25-25 MG PO TABS
1.0000 | ORAL_TABLET | Freq: Every day | ORAL | 3 refills | Status: DC
  Filled 2022-09-14: qty 30, 30d supply, fill #0
  Filled 2022-09-14 – 2022-09-16 (×2): qty 90, 90d supply, fill #0

## 2022-09-14 NOTE — Progress Notes (Signed)
COMMUNITY PALLIATIVE CARE SW NOTE  PATIENT NAME: Patrick [name] DOB: [birth year] MRN: [id number]  PRIMARY CARE PROVIDER: [name], MD  RESPONSIBLE PARTY:  Acct ID - Guarantor Home Phone Work Phone Relationship Acct Type  [id number] - [phone] Self P/F     [address], [place], [place] [address]   Initial Palliative Care Telephonic Encounter  PC SW completed an initial telephonic visit with patient. SW provided education to him regarding palliative care services, visit frequency and role in his care. He provided an initial verbal consent to services. He was provided contact information, and obtained a status update.   Patient report that he is very weak. He is scheduled to have blood work for 09/21/22. He report that he is trying to build up his strength and appetite. He does his own personal care, but it takes him a long time. He does take BOOST. He reports that he has had several falls with a fall as recent as yesterday. He fell trying to get ready for his lab appointment, loss his balance and fell. He states he sustained some bruises.   SW scheduled a face-to-face visit with the palliative care nurse for 09/18/22.   Social History   Tobacco Use   Smoking status: Former   Smokeless tobacco: Former    Types: Chew    Quit date: 1998  Substance Use Topics   Alcohol use: Yes    Alcohol/week: 1.0 standard drink of alcohol    Types: 1 Cans of beer per week    Comment: occ    CODE STATUS: DNR ADVANCED DIRECTIVES: No MOST FORM COMPLETE:  No HOSPICE EDUCATION PROVIDED: Yes  Duration of encounter and documentation: 30 minutes   [employer], LCSW

## 2022-09-14 NOTE — Telephone Encounter (Signed)
(  11:34 am) PC SW left a message for patient requesting a call back to discuss palliative care referral.

## 2022-09-14 NOTE — Telephone Encounter (Signed)
I called mr bynum for his numerous abnormal labs. On his cell and home numbers.  Left VM asking him to call back ASAP to the clinic [phone] or my cell [phone]  I got him on his home #.  He feels fine, just a little weak. He attributes the WBC to a medication that was given to him by oncology.  Is urinating ok.  He has f/u with onc in 2 weeks, being set up for palliative care.  Will cc Dr Burr Medico.

## 2022-09-15 ENCOUNTER — Other Ambulatory Visit (HOSPITAL_COMMUNITY): Payer: Self-pay

## 2022-09-15 ENCOUNTER — Inpatient Hospital Stay: Payer: PPO | Admitting: General Practice

## 2022-09-15 ENCOUNTER — Encounter: Payer: Self-pay | Admitting: Hematology

## 2022-09-15 LAB — CMP14 + ANION GAP
ALT: 14 IU/L (ref 0–44)
AST: 48 IU/L — ABNORMAL HIGH (ref 0–40)
Albumin/Globulin Ratio: 2.2 (ref 1.2–2.2)
Albumin: 4.1 g/dL (ref 3.8–4.8)
Alkaline Phosphatase: 245 IU/L — ABNORMAL HIGH (ref 44–121)
Anion Gap: 19 mmol/L — ABNORMAL HIGH (ref 10.0–18.0)
BUN/Creatinine Ratio: 28 — ABNORMAL HIGH (ref 10–24)
BUN: 37 mg/dL — ABNORMAL HIGH (ref 8–27)
Bilirubin Total: 0.5 mg/dL (ref 0.0–1.2)
CO2: 17 mmol/L — ABNORMAL LOW (ref 20–29)
Calcium: 9.8 mg/dL (ref 8.6–10.2)
Chloride: 102 mmol/L (ref 96–106)
Creatinine, Ser: 1.34 mg/dL — ABNORMAL HIGH (ref 0.76–1.27)
Globulin, Total: 1.9 g/dL (ref 1.5–4.5)
Glucose: 114 mg/dL — ABNORMAL HIGH (ref 70–99)
Potassium: 4.4 mmol/L (ref 3.5–5.2)
Sodium: 138 mmol/L (ref 134–144)
Total Protein: 6 g/dL (ref 6.0–8.5)
eGFR: 56 mL/min/{1.73_m2} — ABNORMAL LOW (ref >59)

## 2022-09-15 LAB — CBC
Hematocrit: 23.5 % — ABNORMAL LOW (ref 37.5–51.0)
Hemoglobin: 7.9 g/dL — ABNORMAL LOW (ref 13.0–17.7)
MCH: 31.3 pg (ref 26.6–33.0)
MCHC: 33.6 g/dL (ref 31.5–35.7)
MCV: 93 fL (ref 79–97)
NRBC: 2 % — ABNORMAL HIGH (ref 0–0)
Platelets: 64 10*3/uL — CL (ref 150–450)
RBC: 2.52 x10E6/uL — CL (ref 4.14–5.80)
RDW: 15.7 % — ABNORMAL HIGH (ref 11.6–15.4)
WBC: 25.3 10*3/uL (ref 3.4–10.8)

## 2022-09-15 LAB — LIPID PANEL
Chol/HDL Ratio: 5.8 ratio — ABNORMAL HIGH (ref 0.0–5.0)
Cholesterol, Total: 127 mg/dL (ref 100–199)
HDL: 22 mg/dL — ABNORMAL LOW (ref >39)
LDL Chol Calc (NIH): 58 mg/dL (ref 0–99)
Triglycerides: 299 mg/dL — ABNORMAL HIGH (ref 0–149)
VLDL Cholesterol Cal: 47 mg/dL — ABNORMAL HIGH (ref 5–40)

## 2022-09-15 LAB — RPR: RPR Ser Ql: NONREACTIVE

## 2022-09-15 LAB — HIV-1 RNA QUANT-NO REFLEX-BLD
HIV-1 RNA Viral Load Log: 2 log10copy/mL
HIV-1 RNA Viral Load: 100 copies/mL

## 2022-09-18 ENCOUNTER — Other Ambulatory Visit: Payer: Self-pay

## 2022-09-18 ENCOUNTER — Other Ambulatory Visit (HOSPITAL_COMMUNITY): Payer: Self-pay

## 2022-09-18 VITALS — BP 112/62 | HR 96 | Resp 18

## 2022-09-18 DIAGNOSIS — Z515 Encounter for palliative care: Secondary | ICD-10-CM

## 2022-09-19 ENCOUNTER — Ambulatory Visit: Payer: PPO | Admitting: Nurse Practitioner

## 2022-09-19 ENCOUNTER — Other Ambulatory Visit: Payer: PPO

## 2022-09-19 ENCOUNTER — Other Ambulatory Visit (HOSPITAL_COMMUNITY): Payer: Self-pay

## 2022-09-20 ENCOUNTER — Encounter (HOSPITAL_COMMUNITY): Payer: Self-pay

## 2022-09-20 ENCOUNTER — Other Ambulatory Visit: Payer: Self-pay

## 2022-09-20 ENCOUNTER — Emergency Department (HOSPITAL_COMMUNITY): Payer: PPO

## 2022-09-20 ENCOUNTER — Inpatient Hospital Stay (HOSPITAL_COMMUNITY)
Admission: EM | Admit: 2022-09-20 | Discharge: 2022-09-25 | DRG: 070 | Disposition: E | Payer: PPO | Attending: Internal Medicine | Admitting: Internal Medicine

## 2022-09-20 ENCOUNTER — Other Ambulatory Visit (HOSPITAL_COMMUNITY): Payer: Self-pay

## 2022-09-20 DIAGNOSIS — R000 Tachycardia, unspecified: Secondary | ICD-10-CM | POA: Diagnosis not present

## 2022-09-20 DIAGNOSIS — I452 Bifascicular block: Secondary | ICD-10-CM | POA: Diagnosis present

## 2022-09-20 DIAGNOSIS — J9601 Acute respiratory failure with hypoxia: Secondary | ICD-10-CM | POA: Diagnosis not present

## 2022-09-20 DIAGNOSIS — C61 Malignant neoplasm of prostate: Secondary | ICD-10-CM

## 2022-09-20 DIAGNOSIS — K121 Other forms of stomatitis: Secondary | ICD-10-CM | POA: Diagnosis present

## 2022-09-20 DIAGNOSIS — Z841 Family history of disorders of kidney and ureter: Secondary | ICD-10-CM

## 2022-09-20 DIAGNOSIS — Z8249 Family history of ischemic heart disease and other diseases of the circulatory system: Secondary | ICD-10-CM | POA: Diagnosis not present

## 2022-09-20 DIAGNOSIS — R0602 Shortness of breath: Secondary | ICD-10-CM | POA: Diagnosis not present

## 2022-09-20 DIAGNOSIS — Z6823 Body mass index (BMI) 23.0-23.9, adult: Secondary | ICD-10-CM

## 2022-09-20 DIAGNOSIS — R4182 Altered mental status, unspecified: Secondary | ICD-10-CM | POA: Diagnosis not present

## 2022-09-20 DIAGNOSIS — E785 Hyperlipidemia, unspecified: Secondary | ICD-10-CM | POA: Diagnosis present

## 2022-09-20 DIAGNOSIS — Y95 Nosocomial condition: Secondary | ICD-10-CM | POA: Diagnosis present

## 2022-09-20 DIAGNOSIS — Z21 Asymptomatic human immunodeficiency virus [HIV] infection status: Secondary | ICD-10-CM | POA: Diagnosis present

## 2022-09-20 DIAGNOSIS — J189 Pneumonia, unspecified organism: Secondary | ICD-10-CM | POA: Diagnosis present

## 2022-09-20 DIAGNOSIS — D630 Anemia in neoplastic disease: Secondary | ICD-10-CM | POA: Diagnosis present

## 2022-09-20 DIAGNOSIS — F419 Anxiety disorder, unspecified: Secondary | ICD-10-CM | POA: Diagnosis present

## 2022-09-20 DIAGNOSIS — K921 Melena: Secondary | ICD-10-CM | POA: Diagnosis present

## 2022-09-20 DIAGNOSIS — Z515 Encounter for palliative care: Secondary | ICD-10-CM | POA: Diagnosis not present

## 2022-09-20 DIAGNOSIS — Z87891 Personal history of nicotine dependence: Secondary | ICD-10-CM | POA: Diagnosis not present

## 2022-09-20 DIAGNOSIS — Z638 Other specified problems related to primary support group: Secondary | ICD-10-CM

## 2022-09-20 DIAGNOSIS — R410 Disorientation, unspecified: Secondary | ICD-10-CM | POA: Diagnosis not present

## 2022-09-20 DIAGNOSIS — C787 Secondary malignant neoplasm of liver and intrahepatic bile duct: Secondary | ICD-10-CM | POA: Diagnosis present

## 2022-09-20 DIAGNOSIS — R64 Cachexia: Secondary | ICD-10-CM | POA: Diagnosis present

## 2022-09-20 DIAGNOSIS — E7800 Pure hypercholesterolemia, unspecified: Secondary | ICD-10-CM

## 2022-09-20 DIAGNOSIS — Z79899 Other long term (current) drug therapy: Secondary | ICD-10-CM | POA: Diagnosis not present

## 2022-09-20 DIAGNOSIS — Z7952 Long term (current) use of systemic steroids: Secondary | ICD-10-CM

## 2022-09-20 DIAGNOSIS — X58XXXA Exposure to other specified factors, initial encounter: Secondary | ICD-10-CM | POA: Diagnosis present

## 2022-09-20 DIAGNOSIS — Z91198 Patient's noncompliance with other medical treatment and regimen for other reason: Secondary | ICD-10-CM

## 2022-09-20 DIAGNOSIS — D72829 Elevated white blood cell count, unspecified: Secondary | ICD-10-CM | POA: Diagnosis present

## 2022-09-20 DIAGNOSIS — R58 Hemorrhage, not elsewhere classified: Secondary | ICD-10-CM | POA: Diagnosis not present

## 2022-09-20 DIAGNOSIS — M1711 Unilateral primary osteoarthritis, right knee: Secondary | ICD-10-CM | POA: Diagnosis present

## 2022-09-20 DIAGNOSIS — S3021XA Contusion of penis, initial encounter: Secondary | ICD-10-CM | POA: Diagnosis present

## 2022-09-20 DIAGNOSIS — Z781 Physical restraint status: Secondary | ICD-10-CM

## 2022-09-20 DIAGNOSIS — D696 Thrombocytopenia, unspecified: Secondary | ICD-10-CM | POA: Diagnosis present

## 2022-09-20 DIAGNOSIS — D649 Anemia, unspecified: Secondary | ICD-10-CM | POA: Diagnosis not present

## 2022-09-20 DIAGNOSIS — D62 Acute posthemorrhagic anemia: Secondary | ICD-10-CM | POA: Diagnosis present

## 2022-09-20 DIAGNOSIS — Z833 Family history of diabetes mellitus: Secondary | ICD-10-CM

## 2022-09-20 DIAGNOSIS — G9341 Metabolic encephalopathy: Secondary | ICD-10-CM | POA: Diagnosis present

## 2022-09-20 DIAGNOSIS — R0902 Hypoxemia: Secondary | ICD-10-CM | POA: Diagnosis not present

## 2022-09-20 DIAGNOSIS — I10 Essential (primary) hypertension: Secondary | ICD-10-CM | POA: Diagnosis present

## 2022-09-20 DIAGNOSIS — R0689 Other abnormalities of breathing: Secondary | ICD-10-CM | POA: Diagnosis not present

## 2022-09-20 DIAGNOSIS — R451 Restlessness and agitation: Secondary | ICD-10-CM | POA: Diagnosis present

## 2022-09-20 DIAGNOSIS — I619 Nontraumatic intracerebral hemorrhage, unspecified: Secondary | ICD-10-CM | POA: Diagnosis present

## 2022-09-20 DIAGNOSIS — Z83719 Family history of colon polyps, unspecified: Secondary | ICD-10-CM

## 2022-09-20 DIAGNOSIS — G9340 Encephalopathy, unspecified: Secondary | ICD-10-CM | POA: Diagnosis not present

## 2022-09-20 DIAGNOSIS — C7982 Secondary malignant neoplasm of genital organs: Secondary | ICD-10-CM | POA: Diagnosis not present

## 2022-09-20 DIAGNOSIS — D6959 Other secondary thrombocytopenia: Secondary | ICD-10-CM | POA: Diagnosis present

## 2022-09-20 DIAGNOSIS — Z9221 Personal history of antineoplastic chemotherapy: Secondary | ICD-10-CM

## 2022-09-20 DIAGNOSIS — Z66 Do not resuscitate: Secondary | ICD-10-CM | POA: Diagnosis present

## 2022-09-20 DIAGNOSIS — Z7189 Other specified counseling: Secondary | ICD-10-CM | POA: Diagnosis not present

## 2022-09-20 DIAGNOSIS — R159 Full incontinence of feces: Secondary | ICD-10-CM | POA: Diagnosis present

## 2022-09-20 DIAGNOSIS — K922 Gastrointestinal hemorrhage, unspecified: Secondary | ICD-10-CM | POA: Diagnosis not present

## 2022-09-20 DIAGNOSIS — R54 Age-related physical debility: Secondary | ICD-10-CM | POA: Diagnosis present

## 2022-09-20 DIAGNOSIS — R231 Pallor: Secondary | ICD-10-CM | POA: Diagnosis present

## 2022-09-20 DIAGNOSIS — T451X5A Adverse effect of antineoplastic and immunosuppressive drugs, initial encounter: Secondary | ICD-10-CM | POA: Diagnosis present

## 2022-09-20 DIAGNOSIS — C7951 Secondary malignant neoplasm of bone: Secondary | ICD-10-CM | POA: Diagnosis present

## 2022-09-20 DIAGNOSIS — B20 Human immunodeficiency virus [HIV] disease: Secondary | ICD-10-CM | POA: Diagnosis not present

## 2022-09-20 DIAGNOSIS — Z801 Family history of malignant neoplasm of trachea, bronchus and lung: Secondary | ICD-10-CM

## 2022-09-20 DIAGNOSIS — R918 Other nonspecific abnormal finding of lung field: Secondary | ICD-10-CM | POA: Diagnosis not present

## 2022-09-20 DIAGNOSIS — R296 Repeated falls: Secondary | ICD-10-CM | POA: Diagnosis present

## 2022-09-20 DIAGNOSIS — E860 Dehydration: Secondary | ICD-10-CM | POA: Diagnosis present

## 2022-09-20 DIAGNOSIS — R7401 Elevation of levels of liver transaminase levels: Secondary | ICD-10-CM | POA: Diagnosis present

## 2022-09-20 DIAGNOSIS — D500 Iron deficiency anemia secondary to blood loss (chronic): Secondary | ICD-10-CM | POA: Diagnosis not present

## 2022-09-20 HISTORY — DX: Malignant (primary) neoplasm, unspecified: C80.1

## 2022-09-20 LAB — CBG MONITORING, ED: Glucose-Capillary: 105 mg/dL — ABNORMAL HIGH (ref 70–99)

## 2022-09-20 LAB — CBC WITH DIFFERENTIAL/PLATELET
Abs Immature Granulocytes: 2.74 10*3/uL — ABNORMAL HIGH (ref 0.00–0.07)
Abs Immature Granulocytes: 0.2 10*3/uL — ABNORMAL HIGH (ref 0.00–0.07)
Band Neutrophils: 3 %
Basophils Absolute: 0.1 10*3/uL (ref 0.0–0.1)
Basophils Absolute: 0 10*3/uL (ref 0.0–0.1)
Basophils Relative: 1 %
Basophils Relative: 0 %
Eosinophils Absolute: 0.1 10*3/uL (ref 0.0–0.5)
Eosinophils Absolute: 0.2 10*3/uL (ref 0.0–0.5)
Eosinophils Relative: 1 %
Eosinophils Relative: 1 %
HCT: 23.2 % — ABNORMAL LOW (ref 39.0–52.0)
HCT: 21.6 % — ABNORMAL LOW (ref 39.0–52.0)
Hemoglobin: 7.3 g/dL — ABNORMAL LOW (ref 13.0–17.0)
Hemoglobin: 6.9 g/dL — CL (ref 13.0–17.0)
Immature Granulocytes: 10 %
Lymphocytes Relative: 14 %
Lymphocytes Relative: 10 %
Lymphs Abs: 3.6 10*3/uL (ref 0.7–4.0)
Lymphs Abs: 2.5 10*3/uL (ref 0.7–4.0)
MCH: 31.1 pg (ref 26.0–34.0)
MCH: 31.4 pg (ref 26.0–34.0)
MCHC: 31.5 g/dL (ref 30.0–36.0)
MCHC: 31.9 g/dL (ref 30.0–36.0)
MCV: 98.7 fL (ref 80.0–100.0)
MCV: 98.2 fL (ref 80.0–100.0)
Monocytes Absolute: 1.6 10*3/uL — ABNORMAL HIGH (ref 0.1–1.0)
Monocytes Absolute: 1 10*3/uL (ref 0.1–1.0)
Monocytes Relative: 6 %
Monocytes Relative: 4 %
Myelocytes: 1 %
Neutro Abs: 17.7 10*3/uL — ABNORMAL HIGH (ref 1.7–7.7)
Neutro Abs: 20.7 10*3/uL — ABNORMAL HIGH (ref 1.7–7.7)
Neutrophils Relative %: 68 %
Neutrophils Relative %: 81 %
Platelets: 35 10*3/uL — ABNORMAL LOW (ref 150–400)
Platelets: 31 10*3/uL — ABNORMAL LOW (ref 150–400)
RBC: 2.35 MIL/uL — ABNORMAL LOW (ref 4.22–5.81)
RBC: 2.2 MIL/uL — ABNORMAL LOW (ref 4.22–5.81)
RDW: 17.3 % — ABNORMAL HIGH (ref 11.5–15.5)
RDW: 17.2 % — ABNORMAL HIGH (ref 11.5–15.5)
WBC: 26 10*3/uL — ABNORMAL HIGH (ref 4.0–10.5)
WBC: 24.7 10*3/uL — ABNORMAL HIGH (ref 4.0–10.5)
nRBC: 2.6 % — ABNORMAL HIGH (ref 0.0–0.2)
nRBC: 2.3 % — ABNORMAL HIGH (ref 0.0–0.2)

## 2022-09-20 LAB — URINALYSIS, ROUTINE W REFLEX MICROSCOPIC
Bacteria, UA: NONE SEEN
Bilirubin Urine: NEGATIVE
Glucose, UA: NEGATIVE mg/dL
Ketones, ur: NEGATIVE mg/dL
Leukocytes,Ua: NEGATIVE
Nitrite: NEGATIVE
Protein, ur: 30 mg/dL — AB
Specific Gravity, Urine: 1.014 (ref 1.005–1.030)
pH: 7 (ref 5.0–8.0)

## 2022-09-20 LAB — COMPREHENSIVE METABOLIC PANEL
ALT: 19 U/L (ref 0–44)
AST: 71 U/L — ABNORMAL HIGH (ref 15–41)
Albumin: 3.7 g/dL (ref 3.5–5.0)
Alkaline Phosphatase: 202 U/L — ABNORMAL HIGH (ref 38–126)
Anion gap: 11 (ref 5–15)
BUN: 17 mg/dL (ref 8–23)
CO2: 22 mmol/L (ref 22–32)
Calcium: 8.9 mg/dL (ref 8.9–10.3)
Chloride: 107 mmol/L (ref 98–111)
Creatinine, Ser: 0.82 mg/dL (ref 0.61–1.24)
GFR, Estimated: >60 mL/min (ref >60)
Glucose, Bld: 98 mg/dL (ref 70–99)
Potassium: 4.2 mmol/L (ref 3.5–5.1)
Sodium: 140 mmol/L (ref 135–145)
Total Bilirubin: 0.9 mg/dL (ref 0.3–1.2)
Total Protein: 5.9 g/dL — ABNORMAL LOW (ref 6.5–8.1)

## 2022-09-20 LAB — I-STAT CHEM 8, ED
BUN: 13 mg/dL (ref 8–23)
Calcium, Ion: 1.2 mmol/L (ref 1.15–1.40)
Chloride: 107 mmol/L (ref 98–111)
Creatinine, Ser: 0.7 mg/dL (ref 0.61–1.24)
Glucose, Bld: 90 mg/dL (ref 70–99)
HCT: 18 % — ABNORMAL LOW (ref 39.0–52.0)
Hemoglobin: 6.1 g/dL — CL (ref 13.0–17.0)
Potassium: 4 mmol/L (ref 3.5–5.1)
Sodium: 139 mmol/L (ref 135–145)
TCO2: 20 mmol/L — ABNORMAL LOW (ref 22–32)

## 2022-09-20 LAB — TSH: TSH: 1.025 u[IU]/mL (ref 0.350–4.500)

## 2022-09-20 LAB — POC OCCULT BLOOD, ED: Fecal Occult Bld: NEGATIVE

## 2022-09-20 LAB — AMMONIA: Ammonia: 60 umol/L — ABNORMAL HIGH (ref 9–35)

## 2022-09-20 LAB — PROTIME-INR
INR: 1.3 — ABNORMAL HIGH (ref 0.8–1.2)
Prothrombin Time: 16 seconds — ABNORMAL HIGH (ref 11.4–15.2)

## 2022-09-20 LAB — LACTIC ACID, PLASMA: Lactic Acid, Venous: 1.3 mmol/L (ref 0.5–1.9)

## 2022-09-20 LAB — CK: Total CK: 75 U/L (ref 49–397)

## 2022-09-20 LAB — MAGNESIUM: Magnesium: 1.7 mg/dL (ref 1.7–2.4)

## 2022-09-20 MED ORDER — ATORVASTATIN CALCIUM 40 MG PO TABS
40.0000 mg | ORAL_TABLET | Freq: Every day | ORAL | 2 refills | Status: DC
  Filled 2022-09-20: qty 90, 90d supply, fill #0

## 2022-09-20 MED ORDER — VANCOMYCIN HCL IN DEXTROSE 1-5 GM/200ML-% IV SOLN
1000.0000 mg | Freq: Two times a day (BID) | INTRAVENOUS | Status: DC
  Administered 2022-09-21 – 2022-09-22 (×3): 1000 mg via INTRAVENOUS
  Filled 2022-09-20 (×3): qty 200

## 2022-09-20 MED ORDER — SODIUM CHLORIDE 0.9 % IV SOLN: INTRAVENOUS | Status: DC

## 2022-09-20 MED ORDER — SODIUM CHLORIDE 0.9 % IV SOLN
2.0000 g | Freq: Three times a day (TID) | INTRAVENOUS | Status: DC
  Administered 2022-09-21 – 2022-09-22 (×4): 2 g via INTRAVENOUS
  Filled 2022-09-20 (×4): qty 12.5

## 2022-09-20 MED ORDER — SODIUM CHLORIDE 0.9 % IV SOLN
2.0000 g | Freq: Once | INTRAVENOUS | Status: AC
  Administered 2022-09-20: 2 g via INTRAVENOUS
  Filled 2022-09-20: qty 12.5

## 2022-09-20 MED ORDER — LACTULOSE 10 GM/15ML PO SOLN: 30.0000 g | Freq: Once | ORAL | Status: DC

## 2022-09-20 MED ORDER — SODIUM CHLORIDE 0.9 % IV BOLUS
1000.0000 mL | Freq: Once | INTRAVENOUS | Status: AC
  Administered 2022-09-20: 1000 mL via INTRAVENOUS

## 2022-09-20 MED ORDER — VANCOMYCIN HCL 1500 MG/300ML IV SOLN
1500.0000 mg | Freq: Once | INTRAVENOUS | Status: AC
  Administered 2022-09-20: 1500 mg via INTRAVENOUS
  Filled 2022-09-20: qty 300

## 2022-09-20 MED ORDER — LACTULOSE ENEMA
300.0000 mL | Freq: Once | ORAL | Status: AC
  Administered 2022-09-20: 300 mL via RECTAL
  Filled 2022-09-20: qty 300

## 2022-09-20 NOTE — Progress Notes (Signed)
A consult was received from an ED physician for vanc/cefepime per pharmacy dosing.  The patient's profile has been reviewed for ht/wt/allergies/indication/available labs.   A one time order has been placed for vanc 1500mg  and cefepime 2g.  Further antibiotics/pharmacy consults should be ordered by admitting physician if indicated.                       Thank you, Kara Mead 09/20/2022  7:03 PM

## 2022-09-20 NOTE — ED Notes (Signed)
ED Provider at bedside. 

## 2022-09-20 NOTE — ED Triage Notes (Signed)
Patient arrived to the ER via EMS due to possible GI bleed. Per EMS, patient had dried blood in the mouth and dark stool in boxers. Pt is altered. Missed chemo treatment today. [facility] called EMS due to patient not answering home or phone.

## 2022-09-20 NOTE — ED Provider Notes (Signed)
[facility] Provider Note   CSN: [id number] Arrival date & time: 09/20/22  1410     History  Chief Complaint  Patient presents with   Rectal Bleeding    TIMO DRUST III is a 72 y.o. male.  Pt is a 72 yo male with pmhx significant for HIV, HTN, HLD, and metastatic prostate cancer.  Pt lives alone.  Pt's landlord called EMS for a welfare check because pt was not answering his phone.Pt has dried blood in his mouth and is incontinent of stool.  Pt is confused and is not a good historian.       Home Medications Prior to Admission medications   Medication Sig Start Date End Date Taking? Authorizing Provider  albuterol (VENTOLIN HFA) 108 (90 Base) MCG/ACT inhaler Inhale 2 puffs into the lungs every 6 (six) hours as needed for shortness of breath or wheezing. 07/27/21   [provider]  amLODipine (NORVASC) 5 MG tablet Take 1 tablet (5 mg total) by mouth daily. 03/01/22 03/01/23  [name], MD  atorvastatin (LIPITOR) 40 MG tablet Take 1 tablet (40 mg total) by mouth daily. 09/20/22   [name], MD  calcium citrate-vitamin D (CITRACAL+D) 315-200 MG-UNIT tablet Take 1 tablet by mouth daily.    [provider]  celecoxib (CELEBREX) 200 MG capsule Take 1 capsule (200 mg total) by mouth 2 (two) times daily. 01/19/22   Truitt Merle, MD  emtricitabine-rilpivir-tenofovir AF (ODEFSEY) 200-25-25 MG TABS tablet Take 1 tablet by mouth daily. 09/14/22   [name], MD  gabapentin (NEURONTIN) 300 MG capsule TAKE 1 CAPSULE(300 MG) BY MOUTH THREE TIMES DAILY 08/23/22   Alla Feeling, NP  lidocaine (LIDODERM) 5 % Place 1 patch onto the skin daily. Remove & Discard patch within 12 hours or as directed by MD 07/06/21   Dorethea Clan, DO  Multiple Vitamins-Minerals (MULTIVITAMIN WITH MINERALS) tablet Take 1 tablet by mouth daily.    [provider]  oxyCODONE (OXY IR/ROXICODONE) 5 MG immediate release tablet Take 1 tablet (5 mg  total) by mouth every 6 (six) hours as needed for severe pain. 08/11/22   Truitt Merle, MD  predniSONE (DELTASONE) 10 MG tablet Take 1 tablet (10 mg total) by mouth daily. 09/02/22   Truitt Merle, MD      Allergies    Patient has no known allergies.    Review of Systems   Review of Systems  Unable to perform ROS: Mental status change  Neurological:  Positive for weakness.  All other systems reviewed and are negative.   Physical Exam Updated Vital Signs BP 113/66   Pulse 100   Temp 99.9 F (37.7 C) (Rectal)   Resp (!) 21   Ht 5\' 10"  (1.778 m)   Wt 75 kg   SpO2 94%   BMI 23.72 kg/m  Physical Exam Vitals and nursing note reviewed. Exam conducted with a chaperone present.  Constitutional:      General: He is in acute distress.     Appearance: He is ill-appearing.  HENT:     Right Ear: External ear normal.     Left Ear: External ear normal.     Nose: Nose normal.     Mouth/Throat:     Mouth: Mucous membranes are dry.     Comments: Dried blood in mouth Eyes:     Pupils: Pupils are equal, round, and reactive to light.     Comments: ambylopia  Cardiovascular:  Rate and Rhythm: Regular rhythm. Tachycardia present.     Pulses: Normal pulses.     Heart sounds: Normal heart sounds.  Pulmonary:     Effort: Pulmonary effort is normal.     Breath sounds: Normal breath sounds.  Abdominal:     General: Abdomen is flat. Bowel sounds are normal.     Palpations: Abdomen is soft.  Genitourinary:    Rectum: Guaiac result negative.     Comments: Bruising to penis Musculoskeletal:        General: Normal range of motion.     Cervical back: Normal range of motion and neck supple.  Skin:    General: Skin is warm.     Capillary Refill: Capillary refill takes less than 2 seconds.     Comments: Petechiae to chest/back  Neurological:     Mental Status: He is disoriented.     Comments: Pt is moving all 4 extremities  Psychiatric:        Behavior: Behavior is slowed.     ED Results /  Procedures / Treatments   Labs (all labs ordered are listed, but only abnormal results are displayed) Labs Reviewed  COMPREHENSIVE METABOLIC PANEL - Abnormal; Notable for the following components:      Result Value   Total Protein 5.9 (*)    AST 71 (*)    Alkaline Phosphatase 202 (*)    All other components within normal limits  CBC WITH DIFFERENTIAL/PLATELET - Abnormal; Notable for the following components:   WBC 26.0 (*)    RBC 2.35 (*)    Hemoglobin 7.3 (*)    HCT 23.2 (*)    RDW 17.3 (*)    Platelets 35 (*)    nRBC 2.6 (*)    All other components within normal limits  AMMONIA - Abnormal; Notable for the following components:   Ammonia 60 (*)    All other components within normal limits  CBG MONITORING, ED - Abnormal; Notable for the following components:   Glucose-Capillary 105 (*)    All other components within normal limits  CULTURE, BLOOD (ROUTINE X 2)  CULTURE, BLOOD (ROUTINE X 2)  LACTIC ACID, PLASMA  MAGNESIUM  CK  PROTIME-INR  URINALYSIS, ROUTINE W REFLEX MICROSCOPIC  LACTIC ACID, PLASMA  TSH  PROTIME-INR  POC OCCULT BLOOD, ED  TYPE AND SCREEN    EKG EKG Interpretation  Date/Time:  Wednesday September 20 2022 15:47:14 EDT Ventricular Rate:  103 PR Interval:  174 QRS Duration: 124 QT Interval:  348 QTC Calculation: 456 R Axis:   -78 Text Interpretation: Sinus tachycardia Atrial premature complex RBBB and LAFB ST elevation, consider inferior injury No significant change since last tracing Confirmed by Gareth Morgan [phone]) on 09/20/2022 5:12:29 PM  Radiology DG Chest Port 1 View  Result Date: 09/20/2022 CLINICAL DATA:  GI bleed EXAM: PORTABLE CHEST 1 VIEW COMPARISON:  PET CT 08/25/22 FINDINGS: Low lung volumes. No pleural effusion. No pneumothorax. Likely normal cardiac and mediastinal contours when accounting low lung volumes. Prominent bilateral interstitial opacities could represent bronchovascular crowding no definite focal airspace opacity. Visualized  upper abdomen is unremarkable. Diffusely sclerotic appearance of the skeletal structures is likely related to the presence of diffuse osseous metastatic disease. Are multiple healing left-sided rib fractures. IMPRESSION: Prominent bilateral interstitial opacities could represent bronchovascular crowding. No definite focal airspace opacity. Low lung volumes. Electronically Signed   By: Marin Roberts M.D.   On: 09/20/2022 14:50    Procedures Procedures    Medications Ordered in ED  Medications  sodium chloride 0.9 % bolus 1,000 mL (0 mLs Intravenous Stopped 09/20/22 1659)    And  0.9 %  sodium chloride infusion ( Intravenous New Bag/Given 09/20/22 1704)    ED Course/ Medical Decision Making/ A&P                             Medical Decision Making Amount and/or Complexity of Data Reviewed Labs: ordered. Radiology: ordered.  Risk Prescription drug management.   This patient presents to the ED for concern of ams, this involves an extensive number of treatment options, and is a complaint that carries with it a high risk of complications and morbidity.  The differential diagnosis includes gi bleed, stroke, electrolyte abn, anemia, infection   Co morbidities that complicate the patient evaluation  HIV, HTN, HLD, and metastatic prostate cancer   Additional history obtained:  Additional history obtained from epic chart review External records from outside source obtained and reviewed including EMS report   Lab Tests:  I Ordered, and personally interpreted labs.  The pertinent results include:  cbc with wbc elevated at 26 and hgb 7.3 (chronic), plt low at 35, cmp nl, fobt neg, ammonia elevated at 60, lactic nl, mg nl, ck nl   Imaging Studies ordered:  I ordered imaging studies including cxr and ct head  I independently visualized and interpreted imaging which showed  CXR: Prominent bilateral interstitial opacities could represent  bronchovascular crowding. No definite focal  airspace opacity. Low  lung volumes.   I agree with the radiologist interpretation   Cardiac Monitoring:  The patient was maintained on a cardiac monitor.  I personally viewed and interpreted the cardiac monitored which showed an underlying rhythm of: nsr   Medicines ordered and prescription drug management:  I ordered medication including ivfs  for dehydration  Reevaluation of the patient after these medicines showed that the patient improved I have reviewed the patients home medicines and have made adjustments as needed   Test Considered:  ct   Critical Interventions:  ivfs   Problem List / ED Course:  AMS:  labs still not back.  Pt signed out to Dr. Billy Fischer at shift change.   Social Determinants of Health:  Lives alone   Dispostion:  After consideration of the diagnostic results and the patients response to treatment, I feel that the patent would benefit from admission.          Final Clinical Impression(s) / ED Diagnoses Final diagnoses:  Acute metabolic encephalopathy  Anemia, unspecified type  Thrombocytopenia ([place])  Prostate cancer [facility])    Rx / DC Orders ED Discharge Orders     None         Isla Pence, MD 09/20/22 1723

## 2022-09-20 NOTE — ED Notes (Signed)
Pt unable to stand for orthostatic v/s 

## 2022-09-20 NOTE — ED Notes (Signed)
Mouth cleaned

## 2022-09-20 NOTE — H&P (Signed)
History and Physical    Patrick [name] [id number] DOB: 1951/06/15 DOA: 09/20/2022  PCP: [name], MD  Patient coming from: Home  Chief Complaint: AMS  HPI: Patrick [name] is a 72 y.o. 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  He had dried blood in his mouth and dark stool in his boxers.  In the ED, patient tachycardic on arrival but blood pressure stable.  Labs significant for WBC 26.0 (differential pending), hemoglobin 7.3, MCV 98.7, platelet count 35k, AST 71 (elevated on previous labs as well), alkaline phosphatase 202 (elevated on previous labs as well), ALT and T. bili normal, INR 1.3, ammonia level 60, UA pending, FOBT negative, CK normal, TSH normal, lactic acid normal, blood cultures drawn.  Chest x-ray showing prominent bilateral interstitial opacities which could represent bronchovascular crowding; no definite focal airspace opacity.  CT head without contrast showing small bilateral extra-axial collections measuring up to 4 mm on the right and 3 mm on the left, possible subacute hematomas or diffuse dural thickening.  No significant mass effect.  Showing diffusely increased calvarial density consistent with the patient's history of osseous metastatic disease. ED physician spoke to Dr. Arnoldo Morale with neurosurgery who felt that the patient was a poor surgical candidate and did not feel that platelet transfusion was indicated at this time.  Patient received 1 L normal saline bolus and was started on normal saline at 125 mL/hr. He was given vancomycin and cefepime and lactulose ordered.  ED physician spoke to the patient's sister and it is reported that sister has not been in touch with the  patient for a while and is not his HCPOA.  Case was discussed with Dr. Halford Chessman with critical care who felt that the patient did not require ICU admission.  [facility] called to admit.  Patient is very confused and is not able to give any history.  He is not sure why he is at the emergency room.  He has no complaints.  Denies headaches, shortness of breath, chest pain, or abdominal pain.  He is not able to give any additional history.  Review of Systems:  Review of Systems  Reason unable to perform ROS: AMS.    Past Medical History:  Diagnosis Date   Allergy    seasonal   Arthritis    r knee   Cancer ([place])    H/O inguinal hernia repair    History of tobacco use    HIV infection ([place])    Hyperlipidemia    Hypertension    Strabismic amblyopia     Past Surgical History:  Procedure Laterality Date   EYE SURGERY     strabismus   HERNIA REPAIR     IR BONE TUMOR(S)RF ABLATION  10/18/2021   IR BONE TUMOR(S)RF ABLATION  10/18/2021   IR KYPHO EA ADDL LEVEL THORACIC OR LUMBAR  10/18/2021   IR KYPHO LUMBAR INC FX REDUCE BONE BX UNI/BIL CANNULATION INC/IMAGING  10/18/2021     reports that he has quit smoking. He quit smokeless tobacco use about 26 years ago.  His smokeless tobacco use included chew. He reports current alcohol use of about 1.0 standard drink of alcohol per week. He reports that he does not use drugs.  No Known Allergies  Family  History  Problem Relation Age of Onset   Colon polyps Mother    Heart disease Mother    Heart disease Father    Kidney disease Father    Lung cancer Father    Diabetes Maternal Grandmother    Colon cancer Neg Hx     Prior to Admission medications   Medication Sig Start Date End Date Taking? Authorizing Provider  albuterol (VENTOLIN HFA) 108 (90 Base) MCG/ACT inhaler Inhale 2 puffs into the lungs every 6 (six) hours as needed for shortness of breath or wheezing. 07/27/21   [provider]  amLODipine (NORVASC) 5 MG tablet Take 1 tablet (5 mg  total) by mouth daily. 03/01/22 03/01/23  [name], MD  atorvastatin (LIPITOR) 40 MG tablet Take 1 tablet (40 mg total) by mouth daily. 09/20/22   [name], MD  calcium citrate-vitamin D (CITRACAL+D) 315-200 MG-UNIT tablet Take 1 tablet by mouth daily.    [provider]  celecoxib (CELEBREX) 200 MG capsule Take 1 capsule (200 mg total) by mouth 2 (two) times daily. 01/19/22   Truitt Merle, MD  emtricitabine-rilpivir-tenofovir AF (ODEFSEY) 200-25-25 MG TABS tablet Take 1 tablet by mouth daily. 09/14/22   [name], MD  gabapentin (NEURONTIN) 300 MG capsule TAKE 1 CAPSULE(300 MG) BY MOUTH THREE TIMES DAILY 08/23/22   Alla Feeling, NP  lidocaine (LIDODERM) 5 % Place 1 patch onto the skin daily. Remove & Discard patch within 12 hours or as directed by MD 07/06/21   Dorethea Clan, DO  Multiple Vitamins-Minerals (MULTIVITAMIN WITH MINERALS) tablet Take 1 tablet by mouth daily.    [provider]  oxyCODONE (OXY IR/ROXICODONE) 5 MG immediate release tablet Take 1 tablet (5 mg total) by mouth every 6 (six) hours as needed for severe pain. 08/11/22   Truitt Merle, MD  predniSONE (DELTASONE) 10 MG tablet Take 1 tablet (10 mg total) by mouth daily. 09/02/22   Truitt Merle, MD    Physical Exam: Vitals:   09/20/22 2000 09/20/22 2030 09/20/22 2115 09/20/22 2130  BP: 106/76 116/83 117/79 121/71  Pulse: (!) 105 (!) 108    Resp: 16 12 (!) 22 (!) 22  Temp:      TempSrc:      SpO2: 95% 97% 96% 96%  Weight:      Height:        Physical Exam Vitals reviewed.  Constitutional:      General: He is not in acute distress. HENT:     Head: Normocephalic and atraumatic.     Mouth/Throat:     Comments: Dried blood in mouth Eyes:     Extraocular Movements: Extraocular movements intact.  Cardiovascular:     Rate and Rhythm: Normal rate and regular rhythm.     Pulses: Normal pulses.  Pulmonary:     Effort: Pulmonary effort is normal. No respiratory distress.     Breath sounds: No  wheezing or rales.  Abdominal:     General: Bowel sounds are normal. There is no distension.     Palpations: Abdomen is soft.     Tenderness: There is no abdominal tenderness.  Musculoskeletal:     Cervical back: Normal range of motion.     Right lower leg: No edema.     Left lower leg: No edema.  Skin:    General: Skin is warm and dry.  Neurological:     General: No focal deficit present.     Mental Status: He is alert.     Cranial  Nerves: No cranial nerve deficit.     Sensory: No sensory deficit.     Motor: No weakness.     Comments: Oriented to person and place.  He knows the year is 2024.     Labs on Admission: I have personally reviewed following labs and imaging studies  CBC: Recent Labs  Lab 09/20/22 1431  WBC 26.0*  NEUTROABS 17.7*  HGB 7.3*  HCT 23.2*  MCV 98.7  PLT 35*   Basic Metabolic Panel: Recent Labs  Lab 09/20/22 1431 09/20/22 1611  NA 140  --   K 4.2  --   CL 107  --   CO2 22  --   GLUCOSE 98  --   BUN 17  --   CREATININE 0.82  --   CALCIUM 8.9  --   MG  --  1.7   GFR: Estimated Creatinine Clearance: 84.1 mL/min (by C-G formula based on SCr of 0.82 mg/dL). Liver Function Tests: Recent Labs  Lab 09/20/22 1431  AST 71*  ALT 19  ALKPHOS 202*  BILITOT 0.9  PROT 5.9*  ALBUMIN 3.7   No results for input(s): "LIPASE", "AMYLASE" in the last 168 hours. Recent Labs  Lab 09/20/22 1431  AMMONIA 60*   Coagulation Profile: Recent Labs  Lab 09/20/22 1431  INR 1.3*   Cardiac Enzymes: Recent Labs  Lab 09/20/22 1611  CKTOTAL 75   BNP (last 3 results) No results for input(s): "PROBNP" in the last 8760 hours. HbA1C: No results for input(s): "HGBA1C" in the last 72 hours. CBG: Recent Labs  Lab 09/20/22 1425  GLUCAP 105*   Lipid Profile: No results for input(s): "CHOL", "HDL", "LDLCALC", "TRIG", "CHOLHDL", "LDLDIRECT" in the last 72 hours. Thyroid Function Tests: Recent Labs    09/20/22 1611  TSH 1.025   Anemia Panel: No  results for input(s): "VITAMINB12", "FOLATE", "FERRITIN", "TIBC", "IRON", "RETICCTPCT" in the last 72 hours. Urine analysis:    Component Value Date/Time   COLORURINE YELLOW 09/20/2022 [place] 09/20/2022 1431   LABSPEC 1.014 09/20/2022 1431   PHURINE 7.0 09/20/2022 1431   GLUCOSEU NEGATIVE 09/20/2022 1431   HGBUR MODERATE (A) 09/20/2022 1431   BILIRUBINUR NEGATIVE 09/20/2022 1431   KETONESUR NEGATIVE 09/20/2022 1431   PROTEINUR 30 (A) 09/20/2022 1431   NITRITE NEGATIVE 09/20/2022 1431   LEUKOCYTESUR NEGATIVE 09/20/2022 1431    Radiological Exams on Admission: CT Head Wo Contrast  Result Date: 09/20/2022 CLINICAL DATA:  Altered mental status EXAM: CT HEAD WITHOUT CONTRAST TECHNIQUE: Contiguous axial images were obtained from the base of the skull through the vertex without intravenous contrast. RADIATION DOSE REDUCTION: This exam was performed according to the departmental dose-optimization program which includes automated exposure control, adjustment of the mA and/or kV according to patient size and/or use of iterative reconstruction technique. COMPARISON:  No prior CT head available, correlation is made with 07/01/2021 MRI head FINDINGS: Evaluation is somewhat limited by motion artifact. Brain: Small bilateral extra-axial collections, which are isodense to the cortex, overlying the bilateral frontoparietal convexities, these measure up to 4 mm on the right and 3 mm on the left. No significant mass effect. No midline shift. No sulcal effacement. No acute infarct, hemorrhage, or mass. No hydrocephalus. No evidence of acute infarction, hemorrhage, mass, mass effect, or midline shift. No hydrocephalus or extra-axial fluid collection. Vascular: No hyperdense vessel. Skull: Diffusely increased calvarial density, without focal lesion, consistent with the patient's history of osseous metastatic disease. Negative for fracture. Sinuses/Orbits: Complete opacification of the right  frontal  and sphenoid sinuses, with near complete opacification of the left sphenoid sinus and right ethmoid air cells. Mucosal thickening in the left ethmoid air cells. No acute finding in the orbits. Other: Fluid in the bilateral mastoid air cells and pneumatized petrous apices. IMPRESSION: 1. Small bilateral extra-axial collections, which measure up to 4 mm on the right and 3 mm on the left, possibly subacute hematomas or diffuse dural thickening. No significant mass effect. 2. Diffusely increased calvarial density, consistent with the patient's history of osseous metastatic disease. These results were called by telephone at the time of interpretation on 09/20/2022 at 6:36 pm to provider [facility], who verbally acknowledged these results. Electronically Signed   By: Merilyn Baba M.D.   On: 09/20/2022 18:36   DG Chest Port 1 View  Result Date: 09/20/2022 CLINICAL DATA:  GI bleed EXAM: PORTABLE CHEST 1 VIEW COMPARISON:  PET CT 08/25/22 FINDINGS: Low lung volumes. No pleural effusion. No pneumothorax. Likely normal cardiac and mediastinal contours when accounting low lung volumes. Prominent bilateral interstitial opacities could represent bronchovascular crowding no definite focal airspace opacity. Visualized upper abdomen is unremarkable. Diffusely sclerotic appearance of the skeletal structures is likely related to the presence of diffuse osseous metastatic disease. Are multiple healing left-sided rib fractures. IMPRESSION: Prominent bilateral interstitial opacities could represent bronchovascular crowding. No definite focal airspace opacity. Low lung volumes. Electronically Signed   By: Marin Roberts M.D.   On: 09/20/2022 14:50    EKG: Independently reviewed. Sinus tachycardia, LAFB, RBBB.  Rate increased since prior tracing but otherwise no significant change.  Assessment and Plan  Altered mental status Patient is very confused, no focal neurodeficit.  Per recent palliative care note, he has had multiple falls.   Labs showing worsening thrombocytopenia.  CT head without contrast showing small bilateral extra-axial collections measuring up to 4 mm on the right and 3 mm on the left, possible subacute hematomas or diffuse dural thickening.  No significant mass effect.  Showing diffusely increased calvarial density consistent with the patient's history of osseous metastatic disease. ED physician spoke to Dr. Arnoldo Morale with neurosurgery who felt that the patient was a poor surgical candidate and did not feel that platelet transfusion was indicated at this time.  Stat brain MRI ordered for further evaluation and currently pending.  Will continue to monitor labs/platelet count closely.  Frequent neurochecks every 2 hours.  Fall precautions.  Also his ammonia level was elevated and lactulose enema ordered.  Repeat ammonia level in the morning.  UA not suggestive of infection and TSH normal.  Palliative care consulted.  Concern for upper GI bleed Acute on chronic blood loss anemia Patient has dried blood in his mouth but no recurrence of hematemesis in the ED.  FOBT negative.  Abdominal exam benign.  Hemoglobin currently 7.3, was 9.0 on labs done 08/11/2022 but has been ranging between 7.0-7.9 over the past 2 weeks.  He is tachycardic but blood pressure stable.  Not on anticoagulation or antiplatelet agents.  Continue IV fluid hydration.  Keep n.p.o., type and screen, and continue to monitor CBC closely.  Transfuse PRBCs if hemoglobin less than 7.  I have consulted Dr. Lorenso Courier, [facility] GI team will see the patient in the morning.  Palliative care consulted as well.  Possible pneumonia Chest x-ray showing prominent bilateral interstitial opacities which could represent bronchovascular crowding; no definite focal airspace opacity.  He does have leukocytosis on labs (WBC count 26.0), no fever.  Lactic acid normal.  Continue antibiotics and check procalcitonin  level.  Monitor WBC count.  Blood cultures pending.  Continuous pulse ox,  supplemental oxygen as needed to keep oxygen saturation above 94%.  Acute on chronic thrombocytopenia In the setting of prostate cancer with bone mets.  Platelet count currently 35k, was in the 50-60k range on labs done over the past 2-3 weeks.  CT head findings were discussed with neurosurgery Dr. Arnoldo Morale who does not feel that platelet transfusion is indicated at this time.  There is also concern for upper GI bleed given dried blood in his mouth but no episodes of hematemesis since arrival to the ED.  FOBT negative.  Continue to monitor labs closely and transfuse platelets if actively bleeding or platelet count <10k.  Hypertension Blood pressure currently stable.  Avoid antihypertensives at this time.  HIV CD4 count 480 on 09/13/2022.  Continue home oral medication when no longer n.p.o.  Stage IV prostate cancer with diffuse bone metastasis Due to rapidly worsening anemia and thrombocytopenia from bone mets, he started on cabazitaxel on 09/05/2022.  During his last oncology office visit on 09/12/2022, he was given referral for palliative home care.  I have placed inpatient palliative care consult.  DVT prophylaxis: SCDs Code Status: DNR  Family Communication: No family available at this time. Level of care: Step Down Unit Admission status: It is my clinical opinion that referral for OBSERVATION is reasonable and necessary in this patient based on the above information provided. The aforementioned taken together are felt to place the patient at high risk for further clinical deterioration. However, it is anticipated that the patient may be medically stable for discharge from the hospital within 24 to 48 hours.   Shela Leff MD [facility]  If 7PM-7AM, please contact night-coverage [email or web address]  09/20/2022, 9:54 PM

## 2022-09-20 NOTE — ED Provider Notes (Signed)
  Physical Exam  BP 108/66   Pulse (!) 104   Temp 98.1 F (36.7 C) (Oral)   Resp (!) 21   Ht 5\' 10"  (1.778 m)   Wt 75 kg   SpO2 100%   BMI 23.72 kg/m   Physical Exam  Procedures  Procedures  ED Course / MDM     Received care of patient from Dr. Lorretta Harp.  Please see her note for prior history, physical and care.  Briefly this is a 72 year old male with a history of HIV, hypertension, hyperlipidemia, metastatic prostate cancer with diffuse bone pain, who presents with generalized weakness and altered mental status after a welfare check was called but he did not present for his scheduled chemotherapy.  Reviewed outpatient palliative care note from March 21--he reported at that time that he was very weak, schedule have blood work on 28 March, and reported that he had been having several falls with a fall as recently as March 20.  Labs have been ordered by Dr. Gilford Raid and were personally evaluated interpreted by me show a normal lactic acid, normal CK, normal magnesium, normal TSH, negative Hemoccult, CMP with chronic AST and alk phos elevation, CBC with leukocytosis which was also present on 20 March, anemia with a hemoglobin of 7.3 which is chronic, worsened thrombocytopenia down to 35,000.  INR elevated to 1.3.  Ammonia 60.  X-ray of the chest was completed and shows prominent bilateral interstitial opacities could represent bronchovascular crowding without definite focal airspace opacity.  Given significant leukocytosis, did order empiric antibiotics.  CT head was completed which shows small bilateral extra-axial collections measuring up to 4 mm on the right and 3 mm on the left, possibly subacute hematomas or diffuse dural thickening, as well as diffusely increased calvarial density consistent with his osseous metastatic disease.  Consulted neurosurgery regarding his small bilateral extra-axial collections.  He is no longer on Eliquis.  He does have severe thrombocytopenia, and INR  elevated likely in the setting of his hepatic metastatic disease.  Discussed with his 66 year old mother.  Initially, patient states he does not want MRI and admission but when offered possible hospice placement he declines at this time and agrees to admission.

## 2022-09-20 NOTE — ED Notes (Signed)
Rectal exam done with md haviland

## 2022-09-20 NOTE — ED Notes (Signed)
Spoke with pt's sister, Kathlynn Grate, on the phone at [phone]. Update provided. Unable to state whether pt has had an MRI previously and if it will be tolerated. Precious Bard states that she will be the best patient contact, as pt's mother is elderly with some health issues herself. Phone numbers in chart updated. Precious Bard states that she lives in [place] and if needed locally to let her know ASAP so she can make travel arrangements to come to [place]. Precious Bard states that she would like to be updated if any significant changes occur.

## 2022-09-20 NOTE — Progress Notes (Signed)
Pharmacy Antibiotic Note  Patrick [name] is a 72 y.o. male admitted on 09/20/2022 with AMS, upper GI bleed concern, possible PNA.  PMH significant for metastatic prostate cancer. In the ED patient received Vancomycin 1500mg  IV and Cefepime 2gm IV x 1 dose each.  Pharmacy has been consulted for Vancomycin and Cefepime dosing for possible pneumonia.  Plan: Cefepime 2gm IV q8h Vancomycin 1000 mg IV Q 12 hrs. Goal AUC 400-550.  Expected AUC: 487.8  SCr used: 0.8 (rounded up from 0.7) Follow renal function F/u culture results and sensitivities  Height: 5\' 10"  (177.8 cm) Weight: 75 kg (165 lb 5.5 oz) IBW/kg (Calculated) : 73  Temp (24hrs), Avg:99 F (37.2 C), Min:98.1 F (36.7 C), Max:99.9 F (37.7 C)  Recent Labs  Lab 09/20/22 1431 09/20/22 1611 09/20/22 2304  WBC 26.0*  --   --   CREATININE 0.82  --  0.70  LATICACIDVEN  --  1.3  --     Estimated Creatinine Clearance: 86.2 mL/min (by C-G formula based on SCr of 0.7 mg/dL).    No Known Allergies  Antimicrobials this admission: 3/27 Cefepime >>   3/27 Vancomycin >>    Dose adjustments this admission:    Microbiology results: 3/27 BCx:      Thank you for allowing pharmacy to be a part of this patient's care.  Everette Rank, PharmD 09/20/2022 11:32 PM

## 2022-09-21 ENCOUNTER — Encounter (HOSPITAL_COMMUNITY): Payer: Self-pay | Admitting: Certified Registered Nurse Anesthetist

## 2022-09-21 ENCOUNTER — Inpatient Hospital Stay: Payer: PPO

## 2022-09-21 ENCOUNTER — Inpatient Hospital Stay (HOSPITAL_COMMUNITY): Payer: PPO

## 2022-09-21 DIAGNOSIS — Y95 Nosocomial condition: Secondary | ICD-10-CM | POA: Diagnosis present

## 2022-09-21 DIAGNOSIS — D62 Acute posthemorrhagic anemia: Secondary | ICD-10-CM | POA: Diagnosis present

## 2022-09-21 DIAGNOSIS — I10 Essential (primary) hypertension: Secondary | ICD-10-CM | POA: Diagnosis present

## 2022-09-21 DIAGNOSIS — C7982 Secondary malignant neoplasm of genital organs: Secondary | ICD-10-CM

## 2022-09-21 DIAGNOSIS — K921 Melena: Secondary | ICD-10-CM | POA: Diagnosis present

## 2022-09-21 DIAGNOSIS — Z83719 Family history of colon polyps, unspecified: Secondary | ICD-10-CM | POA: Diagnosis not present

## 2022-09-21 DIAGNOSIS — Z87891 Personal history of nicotine dependence: Secondary | ICD-10-CM | POA: Diagnosis not present

## 2022-09-21 DIAGNOSIS — I452 Bifascicular block: Secondary | ICD-10-CM | POA: Diagnosis present

## 2022-09-21 DIAGNOSIS — C61 Malignant neoplasm of prostate: Secondary | ICD-10-CM

## 2022-09-21 DIAGNOSIS — D696 Thrombocytopenia, unspecified: Secondary | ICD-10-CM

## 2022-09-21 DIAGNOSIS — R4182 Altered mental status, unspecified: Secondary | ICD-10-CM | POA: Diagnosis not present

## 2022-09-21 DIAGNOSIS — C7951 Secondary malignant neoplasm of bone: Secondary | ICD-10-CM | POA: Diagnosis present

## 2022-09-21 DIAGNOSIS — D6959 Other secondary thrombocytopenia: Secondary | ICD-10-CM | POA: Diagnosis present

## 2022-09-21 DIAGNOSIS — R64 Cachexia: Secondary | ICD-10-CM | POA: Diagnosis present

## 2022-09-21 DIAGNOSIS — Z8249 Family history of ischemic heart disease and other diseases of the circulatory system: Secondary | ICD-10-CM | POA: Diagnosis not present

## 2022-09-21 DIAGNOSIS — D500 Iron deficiency anemia secondary to blood loss (chronic): Secondary | ICD-10-CM

## 2022-09-21 DIAGNOSIS — E785 Hyperlipidemia, unspecified: Secondary | ICD-10-CM | POA: Diagnosis present

## 2022-09-21 DIAGNOSIS — J189 Pneumonia, unspecified organism: Secondary | ICD-10-CM

## 2022-09-21 DIAGNOSIS — Z515 Encounter for palliative care: Secondary | ICD-10-CM | POA: Diagnosis not present

## 2022-09-21 DIAGNOSIS — X58XXXA Exposure to other specified factors, initial encounter: Secondary | ICD-10-CM | POA: Diagnosis present

## 2022-09-21 DIAGNOSIS — F419 Anxiety disorder, unspecified: Secondary | ICD-10-CM | POA: Diagnosis present

## 2022-09-21 DIAGNOSIS — G9341 Metabolic encephalopathy: Secondary | ICD-10-CM

## 2022-09-21 DIAGNOSIS — Z841 Family history of disorders of kidney and ureter: Secondary | ICD-10-CM | POA: Diagnosis not present

## 2022-09-21 DIAGNOSIS — D630 Anemia in neoplastic disease: Secondary | ICD-10-CM | POA: Diagnosis present

## 2022-09-21 DIAGNOSIS — B20 Human immunodeficiency virus [HIV] disease: Secondary | ICD-10-CM

## 2022-09-21 DIAGNOSIS — M1711 Unilateral primary osteoarthritis, right knee: Secondary | ICD-10-CM | POA: Diagnosis present

## 2022-09-21 DIAGNOSIS — D649 Anemia, unspecified: Secondary | ICD-10-CM

## 2022-09-21 DIAGNOSIS — Z21 Asymptomatic human immunodeficiency virus [HIV] infection status: Secondary | ICD-10-CM | POA: Diagnosis present

## 2022-09-21 DIAGNOSIS — K922 Gastrointestinal hemorrhage, unspecified: Secondary | ICD-10-CM | POA: Diagnosis not present

## 2022-09-21 DIAGNOSIS — J9601 Acute respiratory failure with hypoxia: Secondary | ICD-10-CM | POA: Diagnosis not present

## 2022-09-21 DIAGNOSIS — C787 Secondary malignant neoplasm of liver and intrahepatic bile duct: Secondary | ICD-10-CM | POA: Diagnosis present

## 2022-09-21 DIAGNOSIS — I619 Nontraumatic intracerebral hemorrhage, unspecified: Secondary | ICD-10-CM | POA: Diagnosis present

## 2022-09-21 DIAGNOSIS — Z66 Do not resuscitate: Secondary | ICD-10-CM | POA: Diagnosis present

## 2022-09-21 LAB — PREPARE RBC (CROSSMATCH)

## 2022-09-21 LAB — COMPREHENSIVE METABOLIC PANEL
ALT: 19 U/L (ref 0–44)
AST: 73 U/L — ABNORMAL HIGH (ref 15–41)
Albumin: 3.7 g/dL (ref 3.5–5.0)
Alkaline Phosphatase: 208 U/L — ABNORMAL HIGH (ref 38–126)
Anion gap: 12 (ref 5–15)
BUN: 15 mg/dL (ref 8–23)
CO2: 18 mmol/L — ABNORMAL LOW (ref 22–32)
Calcium: 8.9 mg/dL (ref 8.9–10.3)
Chloride: 111 mmol/L (ref 98–111)
Creatinine, Ser: 0.79 mg/dL (ref 0.61–1.24)
GFR, Estimated: >60 mL/min (ref >60)
Glucose, Bld: 120 mg/dL — ABNORMAL HIGH (ref 70–99)
Potassium: 3.8 mmol/L (ref 3.5–5.1)
Sodium: 141 mmol/L (ref 135–145)
Total Bilirubin: 1.2 mg/dL (ref 0.3–1.2)
Total Protein: 5.9 g/dL — ABNORMAL LOW (ref 6.5–8.1)

## 2022-09-21 LAB — MRSA NEXT GEN BY PCR, NASAL: MRSA by PCR Next Gen: NOT DETECTED

## 2022-09-21 LAB — CBC
HCT: 27.6 % — ABNORMAL LOW (ref 39.0–52.0)
Hemoglobin: 8.8 g/dL — ABNORMAL LOW (ref 13.0–17.0)
MCH: 30.9 pg (ref 26.0–34.0)
MCHC: 31.9 g/dL (ref 30.0–36.0)
MCV: 96.8 fL (ref 80.0–100.0)
Platelets: 36 10*3/uL — ABNORMAL LOW (ref 150–400)
RBC: 2.85 MIL/uL — ABNORMAL LOW (ref 4.22–5.81)
RDW: 17.6 % — ABNORMAL HIGH (ref 11.5–15.5)
WBC: 41.3 10*3/uL — ABNORMAL HIGH (ref 4.0–10.5)
nRBC: 2.9 % — ABNORMAL HIGH (ref 0.0–0.2)

## 2022-09-21 LAB — AMMONIA: Ammonia: 48 umol/L — ABNORMAL HIGH (ref 9–35)

## 2022-09-21 LAB — PROCALCITONIN: Procalcitonin: 0.52 ng/mL

## 2022-09-21 MED ORDER — ORAL CARE MOUTH RINSE
15.0000 mL | OROMUCOSAL | Status: DC
  Administered 2022-09-21 – 2022-09-22 (×5): 15 mL via OROMUCOSAL

## 2022-09-21 MED ORDER — SODIUM CHLORIDE 0.9% IV SOLUTION: Freq: Once | INTRAVENOUS | Status: DC

## 2022-09-21 MED ORDER — LACTULOSE 10 GM/15ML PO SOLN
10.0000 g | Freq: Two times a day (BID) | ORAL | Status: AC
  Administered 2022-09-21: 10 g via ORAL
  Filled 2022-09-21: qty 15

## 2022-09-21 MED ORDER — LORAZEPAM 2 MG/ML PO CONC: 1.0000 mg | ORAL | Status: DC

## 2022-09-21 MED ORDER — POTASSIUM CHLORIDE 20 MEQ PO PACK
20.0000 meq | PACK | Freq: Once | ORAL | Status: AC
  Administered 2022-09-21: 20 meq via ORAL
  Filled 2022-09-21: qty 1

## 2022-09-21 MED ORDER — PANTOPRAZOLE SODIUM 40 MG IV SOLR
40.0000 mg | Freq: Two times a day (BID) | INTRAVENOUS | Status: DC
  Administered 2022-09-21 – 2022-09-22 (×3): 40 mg via INTRAVENOUS
  Filled 2022-09-21 (×3): qty 10

## 2022-09-21 MED ORDER — LORAZEPAM 2 MG/ML IJ SOLN
0.5000 mg | Freq: Every day | INTRAMUSCULAR | Status: DC | PRN
  Administered 2022-09-21: 0.5 mg via INTRAVENOUS
  Filled 2022-09-21: qty 1

## 2022-09-21 MED ORDER — CHLORHEXIDINE GLUCONATE CLOTH 2 % EX PADS
6.0000 | MEDICATED_PAD | Freq: Every day | CUTANEOUS | Status: DC
  Administered 2022-09-21 (×2): 6 via TOPICAL

## 2022-09-21 MED ORDER — ORAL CARE MOUTH RINSE: 15.0000 mL | OROMUCOSAL | Status: DC | PRN

## 2022-09-21 MED ORDER — POTASSIUM CHLORIDE CRYS ER 20 MEQ PO TBCR: 20.0000 meq | EXTENDED_RELEASE_TABLET | Freq: Once | ORAL | Status: DC

## 2022-09-21 MED ORDER — LEVALBUTEROL HCL 0.63 MG/3ML IN NEBU: 0.6300 mg | INHALATION_SOLUTION | Freq: Four times a day (QID) | RESPIRATORY_TRACT | Status: DC | PRN

## 2022-09-21 MED ORDER — LORAZEPAM 2 MG/ML IJ SOLN
1.0000 mg | Freq: Once | INTRAMUSCULAR | Status: AC
  Administered 2022-09-21: 1 mg via INTRAVENOUS
  Filled 2022-09-21: qty 1

## 2022-09-21 MED ORDER — SODIUM CHLORIDE 0.9% IV SOLUTION: Freq: Once | INTRAVENOUS | Status: AC

## 2022-09-21 NOTE — ED Notes (Addendum)
Pt. Soiled bed with medium, brown, solid bm after enema solution. Pt. Cleaned up with soap on washcloth and new brief, gown and linen applied with 2 assist.

## 2022-09-21 NOTE — Progress Notes (Signed)
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  Patient noted to have dried blood in his mouth and dark stool in his underwear.  Patient seen in the ED noted to be tachycardic, significant leukocytosis, anemic with hemoglobin dropping as low as 6.1.  Patient transfused a unit of packed red blood cells.  Chest x-ray was done concerning for possible bilateral interstitial opacities concerning for infiltrate.  Head CT done with small bilateral extra-axial collections measuring up to 4 mm on the right and 3 mm on the left, possible subacute hematomas or diffuse dural thickening.  No significant mass effect noted.  ED physician spoke with neurosurgery who felt patient was a poor surgical candidate and did not recommend platelet transfusion at this time.  Patient given IV fluids.  Admitted to the stepdown unit.  Placed empirically on IV antibiotics, lactulose ordered.  GI consultation placed.   Assessment & Plan:   Principal Problem:   AMS (altered mental status) Active Problems:   Acute on chronic anemia   Thrombocytopenia (HCC)   Upper GI bleed   Pneumonia   Acute metabolic encephalopathy   Hypertension   HIV infection ([place])   Prostate cancer ([place])  #1 acute metabolic encephalopathy -??  Etiology.  Patient noted to be very confused on admission, with no focal neurological deficit. -Patient noted to have elevated ammonia level and given some lactulose enema in the ED with some clinical improvement. -Patient alert and oriented  to self place and time however with some slow responses. -CT head done with small bilateral extra-axial collections measuring up to 4 mm on the right 3 mm on the left, possible subacute hematomas or diffuse dural thickening.  No significant mass effect.  Diffusely increased calvarium density consistent with patient's history of osseous metastatic disease. -Per admitting physician ED spoke with neurosurgery, Dr. Arnoldo Morale who felt patient was a poor surgical candidate did not feel platelet transfusion was indicated. -MRI brain ordered and currently pending. -Continue neurochecks.  Fall precautions. -Continue empiric IV antibiotics for concern for pneumonia. -Status post transfusion 1 unit packed red blood cells. -Will give a dose of lactulose 10 g twice daily today. -Supportive care.  2.  Concern for UGIB/acute on chronic blood loss anemia -Patient noted to have dried blood in his mouth and noted to have dark stool in his underwear on presentation. -FOBT negative. -Abdominal exam unremarkable. -Hemoglobin noted on admission was 7.3 from 9.0 (08/11/2022). -Hemoglobin noted on redraw at 6.1. -Status post transfusion 1 unit packed red blood cells hemoglobin currently at 8.8 this morning. -Continue IV PPI every 12 hours. -GI consulted.  3.  Probable pneumonia -Chest x-ray done on admission with prominent bilateral interstitial opacities could represent bronchovascular crowding, no definite focal airspace opacity noted. -Patient with a significant leukocytosis of 26 K on admission which are further risen to 41.3 K today. -MRSA PCR negative. -Check a urine Legionella antigen, check a urine pneumococcus antigen. -Blood cultures pending. -Continue empiric IV vancomycin, IV cefepime. -Supportive care.  4.  Acute on chronic thrombocytopenia -In the setting of prostate cancer with bone mets. -  Over the past 2 weeks platelet count noted to be between 50-60 K. -Platelet count currently at 36 K. -EDP  discussed with neurosurgery, Dr. Arnoldo Morale who did not feel platelet transfusion indicated at this time. -Patient also with concerns for upper GI bleed.  FOBT negative. -Follow platelet count and transfuse for platelet count < 10 or if actively bleeding.  5.  HIV -CD4 count 480 (09/13/2022). -Once tolerating oral intake and no longer n.p.o. we will resume home oral regiment.  6.  Hypertension Blood pressure currently soft. -Continue to hold antihypertensive medications.  7.  Stage IV prostate cancer with diffuse bony mets -Due to rapidly worsening anemia, thrombocytopenia likely from bone mets patient was started on cabazitaxel on 09/05/2022.  During last oncology office visit on 09/12/2022 he was given referral for palliative home care. -Palliative care consultation placed and pending. -Will inform oncology of patient's admission.   DVT prophylaxis: SCDs Code Status: DNR Family Communication: Updated patient.  No family at bedside. Disposition: TBD  Status is: Inpatient The patient will require care spanning > 2 midnights and should be moved to inpatient because: Severity of illness   Consultants:  Palliative care pending  Procedures: Transfused 1 unit packed red blood cells 09/21/2022  Antimicrobials:  IV vancomycin 09/20/2022>>>> IV cefepime 09/20/2022>>>>   Subjective: Patient laying in bed with mittens on.  Patient alert and oriented to self place and time.  Still somewhat a little slow with some of his responses.  Denies any chest pain.  Denies any shortness of breath.  No abdominal pain.  Objective: Vitals:   09/21/22 0700 09/21/22 0720 09/21/22 0800 09/21/22 0820  BP: (!) 94/56  (!) 108/57   Pulse: (!) 110 (!) 136 (!) 106   Resp: (!) 31 (!) 33 (!) 29   Temp:    98.4 F (36.9 C)  TempSrc:    Oral  SpO2: 93% (!) 72% 94%   Weight:      Height:        Intake/Output Summary (Last 24 hours) at 09/21/2022 1043 Last data filed at 09/21/2022 0800 Gross per 24 hour   Intake 3211.31 ml  Output 400 ml  Net 2811.31 ml   Filed Weights   09/20/22 1417 09/21/22 0230  Weight: 75 kg 69.7 kg    Examination:  General exam: NAD.  Pallor.  Dried blood noted on lips and on tongue. Respiratory system: Clear to auscultation anterior lung fields.  No wheezes, no crackles.  Fair air movement.  Speaking in full sentences.. Cardiovascular system: Tachycardia.  No JVD.  No murmurs rubs or gallops.  No lower extremity edema.  Gastrointestinal system: Abdomen is nondistended, soft and nontender. No organomegaly or masses felt. Normal bowel sounds heard. Central nervous system: Alert and oriented.  Moving extremities spontaneously.  No focal neurological deficits. Extremities: Symmetric 5 x 5 power. Skin: No rashes, lesions or ulcers Psychiatry: Judgement and insight appear poor to fair. Mood & affect appropriate.     Data Reviewed: I have personally reviewed following labs and imaging studies  CBC: Recent Labs  Lab 09/20/22 1431 09/20/22 2255 09/20/22 2304 09/21/22 0727  WBC 26.0* 24.7*  --  41.3*  NEUTROABS 17.7* 20.7*  --   --   HGB 7.3* 6.9* 6.1* 8.8*  HCT 23.2* 21.6* 18.0* 27.6*  MCV 98.7 98.2  --  96.8  PLT 35* 31*  --  36*    Basic Metabolic Panel: Recent Labs  Lab 09/20/22 1431 09/20/22 1611 09/20/22 2304 09/21/22 0727  NA 140  --  139 141  K 4.2  --  4.0 3.8  CL 107  --  107 111  CO2 22  --   --  18*  GLUCOSE 98  --  90 120*  BUN 17  --  13 15  CREATININE 0.82  --  0.70 0.79  CALCIUM 8.9  --   --  8.9  MG  --  1.7  --   --     GFR: Estimated Creatinine Clearance: 82.3 mL/min (by C-G formula based on SCr of 0.79 mg/dL).  Liver Function Tests: Recent Labs  Lab 09/20/22 1431 09/21/22 0727  AST 71* 73*  ALT 19 19  ALKPHOS 202* 208*  BILITOT 0.9 1.2  PROT 5.9* 5.9*  ALBUMIN 3.7 3.7    CBG: Recent Labs  Lab 09/20/22 1425  GLUCAP 105*     Recent Results (from the past 240 hour(s))  Culture, blood (routine x 2)      Status: None (Preliminary result)   Collection Time: 09/20/22  4:11 PM   Specimen: BLOOD RIGHT ARM  Result Value Ref Range Status   Specimen Description   Final    BLOOD RIGHT ARM Performed at [facility] [address] [place], [place] [address]    Special Requests   Final    BOTTLES DRAWN AEROBIC AND ANAEROBIC Blood Culture adequate volume Performed at [facility] [address], [place], [place] [address]    Culture   Final    NO GROWTH < 12 HOURS Performed at [facility] [address] [place], [place] [address]    Report Status PENDING  Incomplete  Culture, blood (routine x 2)     Status: None (Preliminary result)   Collection Time: 09/20/22  4:11 PM   Specimen: BLOOD LEFT ARM  Result Value Ref Range Status   Specimen Description   Final    BLOOD LEFT ARM Performed at [facility], [place] [address] [place], [place] [address]    Special Requests   Final    BOTTLES DRAWN AEROBIC AND ANAEROBIC Blood Culture adequate volume Performed at [facility] [address] [place], [place] [address]    Culture   Final    NO GROWTH < 12 HOURS Performed at [facility] [address] [place], [place] [address]    Report Status PENDING  Incomplete  MRSA Next Gen by PCR, Nasal     Status: None   Collection Time: 09/21/22  2:36 AM   Specimen: Nasal Mucosa; Nasal Swab  Result Value Ref Range Status   MRSA by PCR Next Gen NOT DETECTED NOT DETECTED Final    Comment: (NOTE) The GeneXpert MRSA Assay (FDA approved for NASAL specimens only), is one component of a comprehensive MRSA colonization surveillance program. It is not intended to diagnose MRSA infection nor to guide or monitor treatment for MRSA infections. Test performance is not FDA approved in patients less than 56 years old. Performed at [facility], [place] [address], [place], [place] [address]           Radiology Studies: CT Head Wo Contrast  Result Date: 09/20/2022 CLINICAL DATA:  Altered mental status EXAM: CT HEAD WITHOUT CONTRAST TECHNIQUE: Contiguous axial images were obtained from the base of the skull through the vertex without intravenous contrast. RADIATION DOSE REDUCTION: This exam was performed according to the departmental dose-optimization program which includes automated exposure control, adjustment of the mA and/or kV according to  patient size and/or use of iterative reconstruction technique. COMPARISON:  No prior CT head available, correlation is made with 07/01/2021 MRI head FINDINGS: Evaluation is somewhat limited by motion artifact. Brain: Small bilateral extra-axial collections, which are isodense to the cortex, overlying the bilateral frontoparietal convexities, these measure up to 4 mm on the right and 3 mm on the left. No significant mass effect. No midline shift. No sulcal effacement. No acute infarct, hemorrhage, or mass. No hydrocephalus. No evidence of acute infarction, hemorrhage, mass, mass effect, or midline shift. No hydrocephalus or extra-axial fluid collection. Vascular: No hyperdense vessel. Skull: Diffusely increased calvarial density, without focal lesion, consistent with the patient's history of osseous metastatic disease. Negative for fracture. Sinuses/Orbits: Complete opacification of the right frontal and sphenoid sinuses, with near complete opacification of the left sphenoid sinus and right ethmoid air cells. Mucosal thickening in the left ethmoid air cells. No acute finding in the orbits. Other: Fluid in the bilateral mastoid air cells and pneumatized petrous apices. IMPRESSION: 1. Small bilateral extra-axial collections, which measure up to 4 mm on the right and 3 mm on the left, possibly subacute hematomas or diffuse dural thickening. No significant mass effect. 2. Diffusely increased calvarial density, consistent with the patient's history of osseous  metastatic disease. These results were called by telephone at the time of interpretation on 09/20/2022 at 6:36 pm to provider [facility], who verbally acknowledged these results. Electronically Signed   By: Merilyn Baba M.D.   On: 09/20/2022 18:36   DG Chest Port 1 View  Result Date: 09/20/2022 CLINICAL DATA:  GI bleed EXAM: PORTABLE CHEST 1 VIEW COMPARISON:  PET CT 08/25/22 FINDINGS: Low lung volumes. No pleural effusion. No pneumothorax. Likely normal cardiac and mediastinal contours when accounting low lung volumes. Prominent bilateral interstitial opacities could represent bronchovascular crowding no definite focal airspace opacity. Visualized upper abdomen is unremarkable. Diffusely sclerotic appearance of the skeletal structures is likely related to the presence of diffuse osseous metastatic disease. Are multiple healing left-sided rib fractures. IMPRESSION: Prominent bilateral interstitial opacities could represent bronchovascular crowding. No definite focal airspace opacity. Low lung volumes. Electronically Signed   By: Marin Roberts M.D.   On: 09/20/2022 14:50        Scheduled Meds:  Chlorhexidine Gluconate Cloth  6 each Topical Daily   mouth rinse  15 mL Mouth Rinse 4 times per day   pantoprazole (PROTONIX) IV  40 mg Intravenous Q12H   Continuous Infusions:  sodium chloride 125 mL/hr at 09/21/22 0800   ceFEPime (MAXIPIME) IV Stopped (09/21/22 0448)   vancomycin Stopped (09/21/22 0923)     LOS: 0 days    Time spent: 45 minutes    Irine Seal, MD [facility]   To contact the attending provider between 7A-7P or the covering provider during after hours 7P-7A, please log into the web site [email or web address] and access using universal [place] password for that web site. If you do not have the password, please call the hospital operator.  09/21/2022, 10:43 AM

## 2022-09-21 NOTE — Consult Note (Addendum)
Consultation Note   Referring Provider:  [facility] PCP: [name], MD Primary Gastroenterologist: Dr. Loletha Carrow        Reason for consultation: Upper GI bleed  Hospital Day: 2   Assessment  Upper GI bleed, acute on chronic anemia -Hgb 6.9, MCV 98.2, platelet 31 -FOBT negative -BUN 15, Cr. 0.79  Altered Mental Status - labs show worsening thrombocytopenia - ammonia level elevated and lactulose enema given (improved from 60 to 48) - Brain MRI ordered  Acute on chronic thrombocytopenia - platelet count 31k  Possible pneumonia - CXR shows  bilateral interstitial opacities and patient has leukocytosis with WBC 24.7   Plan  Patient with acute on chronic anemia and evidence of suspected GI bleed with dried blood around mouth and report of dark stool in boxers. BUN normal with dropping hgb and platelet count of 31k. Patient slightly altered mental status but oriented to time, place, and person. Could consider EGD for further evaluation of suspected upper GI bleed. Patient stable at this time. If EGD were to be completed would recommend platelet infusion with intent to bring platelets above 50k and recheck of CBC s/p 1 unit PRBCs that he received this morning. Will discuss with sister listed on DPR to obtain consent for procedure if decision is made to proceed. Continue daily CBC and transfuse as needed to maintain HGB > 7  Protonix IV 40mg  BID Continue NPO status   History of Present Illness   Patient is a 72 y.o. year old male with a past medical history of stage IV prostate cancer with diffuse bone metastasis on chemotherapy, anemia, thrombocytopenia, HIV, hypertension, hyperlipidemia, presents for evaluation of suspected upper GI bleed.  Patient originally presented to ED with generalized weakness and altered mental status.  He was scheduled for chemotherapy and when he did not arrive with a welfare check was administered in  which she was found in his current state. There was dried blood around his mouth and dark stool in his boxers.  Upon admission Hgb 7.3, MCV 98.7, platelet count 35.  Upon review, appears patient has chronic anemia with baseline hemoglobin 9-10.  Thrombocytopenia progressively worsening over the last 2 months.  AST 73/ALT 19/alk phos 208.  AST chronically elevated.  FOBT negative, INR 1.3. patient is not currently on any anticoagulation or antiplatelet therapy.  Patient states he feels he is at the hospital due to a fall that occurred in his yard last week. Patient denies pain. States he has been having normal bowel movements and denies vomiting. Patient is oriented to time, place, and self though appears slightly altered throughout history.     Imaging:  -CT head without contrast showed possible subacute hematomas or diffuse dural thickening, diffuse increased capillary density consistent with history of osseous metastatic disease.  Neurosurgery felt patient was a poor surgical candidate -PET scan from skull to midthigh 08/25/2022 showed new lesion in right hepatic lobe concerning for liver metastasis, skeletal metastasis, no pulmonary metastasis    Previous GI History / Evaluation :  -Colonoscopy 01/01/2017 done for first screening colonoscopy: Decreased sphincter tone, redundant colon, two 4 mm tubular adenomas with no high-grade dysplasia in proximal ascending colon and the appendiceal orifice.    Recent Labs  09/20/22 1431 09/20/22 2255 09/20/22 2304  WBC 26.0* 24.7*  --   HGB 7.3* 6.9* 6.1*  HCT 23.2* 21.6* 18.0*  PLT 35* 31*  --    Recent Labs    09/20/22 1431 09/20/22 2304 09/21/22 0727  NA 140 139 141  K 4.2 4.0 3.8  CL 107 107 111  CO2 22  --  18*  GLUCOSE 98 90 120*  BUN 17 13 15   CREATININE 0.82 0.70 0.79  CALCIUM 8.9  --  8.9   Recent Labs    09/21/22 0727  PROT 5.9*  ALBUMIN 3.7  AST 73*  ALT 19  ALKPHOS 208*  BILITOT 1.2   No results for input(s):  "HEPBSAG", "HCVAB", "HEPAIGM", "HEPBIGM" in the last 72 hours. Recent Labs    09/20/22 1431  LABPROT 16.0*  INR 1.3*    Past Medical History:  Diagnosis Date   Allergy    seasonal   Arthritis    r knee   Cancer ([place])    H/O inguinal hernia repair    History of tobacco use    HIV infection ([place])    Hyperlipidemia    Hypertension    Strabismic amblyopia     Past Surgical History:  Procedure Laterality Date   EYE SURGERY     strabismus   HERNIA REPAIR     IR BONE TUMOR(S)RF ABLATION  10/18/2021   IR BONE TUMOR(S)RF ABLATION  10/18/2021   IR KYPHO EA ADDL LEVEL THORACIC OR LUMBAR  10/18/2021   IR KYPHO LUMBAR INC FX REDUCE BONE BX UNI/BIL CANNULATION INC/IMAGING  10/18/2021    Family History  Problem Relation Age of Onset   Colon polyps Mother    Heart disease Mother    Heart disease Father    Kidney disease Father    Lung cancer Father    Diabetes Maternal Grandmother    Colon cancer Neg Hx     Prior to Admission medications   Medication Sig Start Date End Date Taking? Authorizing Provider  albuterol (VENTOLIN HFA) 108 (90 Base) MCG/ACT inhaler Inhale 2 puffs into the lungs every 6 (six) hours as needed for shortness of breath or wheezing. 07/27/21  Yes [provider]  amLODipine (NORVASC) 5 MG tablet Take 1 tablet (5 mg total) by mouth daily. 03/01/22 03/01/23 Yes [name], MD  atorvastatin (LIPITOR) 40 MG tablet Take 1 tablet (40 mg total) by mouth daily. 09/20/22  Yes [name], MD  calcium citrate-vitamin D (CITRACAL+D) 315-200 MG-UNIT tablet Take 1 tablet by mouth daily.   Yes [provider]  emtricitabine-rilpivir-tenofovir AF (ODEFSEY) 200-25-25 MG TABS tablet Take 1 tablet by mouth daily. 09/14/22  Yes [name], MD  gabapentin (NEURONTIN) 300 MG capsule TAKE 1 CAPSULE(300 MG) BY MOUTH THREE TIMES DAILY 08/23/22  Yes Alla Feeling, NP  Multiple Vitamins-Minerals (MULTIVITAMIN WITH MINERALS) tablet Take 1 tablet by mouth daily.    Yes [provider]  oxyCODONE (OXY IR/ROXICODONE) 5 MG immediate release tablet Take 1 tablet (5 mg total) by mouth every 6 (six) hours as needed for severe pain. 08/11/22  Yes Truitt Merle, MD  predniSONE (DELTASONE) 10 MG tablet Take 1 tablet (10 mg total) by mouth daily. 09/02/22  Yes Truitt Merle, MD  celecoxib (CELEBREX) 200 MG capsule Take 1 capsule (200 mg total) by mouth 2 (two) times daily. Patient not taking: Reported on 09/20/2022 01/19/22   Truitt Merle, MD  lidocaine (LIDODERM) 5 % Place 1 patch onto the skin daily. Remove & Discard  patch within 12 hours or as directed by MD Patient not taking: Reported on 09/20/2022 07/06/21   Dorethea Clan, DO    Current Facility-Administered Medications  Medication Dose Route Frequency Provider Last Rate Last Admin   0.9 %  sodium chloride infusion   Intravenous Continuous Shela Leff, MD 125 mL/hr at 09/21/22 0600 Infusion Verify at 09/21/22 0600   ceFEPIme (MAXIPIME) 2 g in sodium chloride 0.9 % 100 mL IVPB  2 g Intravenous Q8H Poindexter, Leann T, RPH   Stopped at 09/21/22 0448   Chlorhexidine Gluconate Cloth 2 % PADS 6 each  6 each Topical Daily Shela Leff, MD   6 each at 09/21/22 0228   Oral care mouth rinse  15 mL Mouth Rinse 4 times per day Shela Leff, MD   15 mL at 09/21/22 [id number]   Oral care mouth rinse  15 mL Mouth Rinse PRN Shela Leff, MD       vancomycin (VANCOCIN) IVPB 1000 mg/200 mL premix  1,000 mg Intravenous Q12H Poindexter, Leann T, RPH 200 mL/hr at 09/21/22 0823 1,000 mg at 09/21/22 [id number]    Allergies as of 09/20/2022   (No Known Allergies)    Social History   Socioeconomic History   Marital status: Single    Spouse name: Not on file   Number of children: Not on file   Years of education: Not on file   Highest education level: Not on file  Occupational History   Not on file  Tobacco Use   Smoking status: Former   Smokeless tobacco: Former    Types: Chew    Quit date: 1998  Vaping Use    Vaping Use: Never used  Substance and Sexual Activity   Alcohol use: Yes    Alcohol/week: 1.0 standard drink of alcohol    Types: 1 Cans of beer per week    Comment: occ   Drug use: No   Sexual activity: Never    Comment: pt. declined condoms  Other Topics Concern   Not on file  Social History Narrative   Not on file   Social Determinants of Health   Financial Resource Strain: Not on file  Food Insecurity: Not on file  Transportation Needs: Not on file  Physical Activity: Not on file  Stress: Not on file  Social Connections: Not on file  Intimate Partner Violence: Not on file    Review of Systems: All systems reviewed and negative except where noted in HPI.  Physical Exam: Vital signs in last 24 hours: Temp:  [98.1 F (36.7 C)-99.9 F (37.7 C)] 98.4 F (36.9 C) (03/28 0400) Pulse Rate:  [85-136] 136 (03/28 0720) Resp:  [12-33] 33 (03/28 0720) BP: (92-156)/(44-92) 94/56 (03/28 0700) SpO2:  [72 %-100 %] 72 % (03/28 0720) Weight:  [69.7 kg-75 kg] 69.7 kg (03/28 0230) Last BM Date : 09/21/22  Physical Exam Vitals reviewed.  Constitutional:      Appearance: He is ill-appearing.  HENT:     Head: Normocephalic and atraumatic.     Nose: Nose normal. No congestion.     Mouth/Throat:     Comments: Dried dark blood around mouth Eyes:     General: No scleral icterus.    Extraocular Movements: Extraocular movements intact.     Comments: Conjunctival pallor  Cardiovascular:     Rate and Rhythm: Normal rate and regular rhythm.  Pulmonary:     Effort: Pulmonary effort is normal. No respiratory distress.  Abdominal:     General: Abdomen is  flat. Bowel sounds are normal. There is no distension.     Palpations: Abdomen is soft. There is no mass.     Tenderness: There is no abdominal tenderness. There is no guarding or rebound.     Hernia: No hernia is present.  Musculoskeletal:        General: No swelling. Normal range of motion.     Cervical back: Normal range of motion  and neck supple.  Skin:    General: Skin is warm and dry.     Coloration: Skin is not jaundiced.  Neurological:     Mental Status: He is alert.     Cranial Nerves: No cranial nerve deficit.     Sensory: No sensory deficit.     Comments: Oriented to time, person, place (knows year is 2024). Though difficult to obtain accurate history.  Psychiatric:        Mood and Affect: Mood normal.        Behavior: Behavior normal.        Thought Content: Thought content normal.     Comments: Medical mittens     Physical Exam Vitals reviewed.  Constitutional:      Appearance: He is ill-appearing.  HENT:     Head: Normocephalic and atraumatic.     Nose: Nose normal. No congestion.     Mouth/Throat:     Comments: Dried dark blood around mouth Eyes:     General: No scleral icterus.    Extraocular Movements: Extraocular movements intact.     Comments: Conjunctival pallor  Cardiovascular:     Rate and Rhythm: Normal rate and regular rhythm.  Pulmonary:     Effort: Pulmonary effort is normal. No respiratory distress.  Abdominal:     General: Abdomen is flat. Bowel sounds are normal. There is no distension.     Palpations: Abdomen is soft. There is no mass.     Tenderness: There is no abdominal tenderness. There is no guarding or rebound.     Hernia: No hernia is present.  Musculoskeletal:        General: No swelling. Normal range of motion.     Cervical back: Normal range of motion and neck supple.  Skin:    General: Skin is warm and dry.     Coloration: Skin is not jaundiced.  Neurological:     Mental Status: He is alert.     Cranial Nerves: No cranial nerve deficit.     Sensory: No sensory deficit.     Comments: Oriented to time, person, place (knows year is 2024). Though difficult to obtain accurate history.  Psychiatric:        Mood and Affect: Mood normal.        Behavior: Behavior normal.        Thought Content: Thought content normal.     Comments: Medical mittens        Intake/Output from previous day: 03/27 0701 - 03/28 0700 In: 2961.3 [I.V.:1405.3; Blood:456; IV [id number] Out: 400 [Urine:400] Intake/Output this shift:  No intake/output data recorded.    Principal Problem:   AMS (altered mental status) Active Problems:   Acute on chronic anemia   Thrombocytopenia (HCC)   Upper GI bleed   Pneumonia    Bayley McMichael PA-C @  09/21/2022, 8:25 AM   Attending physician's note   I have taken history, reviewed the chart and examined the patient. I performed a substantive portion of this encounter, including complete performance of at least one of the key  components, in conjunction with the APP. I agree with the Advanced Practitioner's note, impression and recommendations.   ?UGI bleed- has dried blood in his mouth.  Also with oral ulcers. Had ?melena. FOBT -ve. Acute on chronic anemia Thrombocytopenia- likely d/t chemo (cabazitaxel). HIV Metastatic prostate CA (+ PET for liver lesion)-liver/bones.  Plan: -IV Protonix -EGD in AM (consent obtained from sister) after platelet transfusion. -Trend CBC. Keep Hb>7. -Avoid NSAIDs including Celebrex. -Palliative care consultation   Carmell Austria, MD [facility] [phone]

## 2022-09-21 NOTE — Progress Notes (Signed)
Spoke with hospitalist regarding low platelet count. Dr. Grandville Silos will give platelets early tomorrow AM and recheck count prior to procedure. Procedure is scheduled for 10am.

## 2022-09-21 NOTE — TOC Progression Note (Signed)
Transition of Care [facility]) - Progression Note    Patient Details  Name: DCARLOS RIBEIRO MRN: [id number] Date of Birth: 1950-07-03  [facility] CM/SW Contact  Henrietta Dine, RN Phone Number: 09/21/2022, 8:53 AM  Clinical Narrative:    Pt from home, and followed by [facility]; disoriented; unable to complete TOC assessment        Expected Discharge Plan and Services                                               Social Determinants of Health (SDOH) Interventions SDOH Screenings   Depression (PHQ2-9): Low Risk  (03/15/2022)  Tobacco Use: Medium Risk (09/20/2022)    Readmission Risk Interventions     No data to display

## 2022-09-21 NOTE — ED Notes (Signed)
Called pt's sister, Kathlynn Grate, to obtain verbal consent to blood transfusion over the phone with Vicente Males, [place]. Consent obtained for transfusion.

## 2022-09-21 NOTE — ED Notes (Addendum)
Lactulose enema administered via flexiseal rectal tube. Leakage around tube noted. Balloon deflated and tube reinserted. Pericare done and linens changed. Flexiseal to be unclamped around 0055.

## 2022-09-21 NOTE — Progress Notes (Signed)
1226 pm Palliative Care Initial Encounter Note   PATIENT NAME: Patrick [name] DOB: [birth year] MRN: [id number]  PRIMARY CARE PROVIDER: [name], MD  RESPONSIBLE PARTY:  Acct ID - Guarantor Home Phone Work Phone Relationship Acct Type  [id number] Patrick [name],[id number] [phone] Self P/F     [address] [place], [place], [place] [address]   RN completed home visit.   HISTORY OF PRESENT ILLNESS:  :  72 y.o. male with a pertinent PMH undetectable HIV on ART since 2002, HTN on olmesartan-amlodipine-HCTZ 40-5-25, and chronic lumbar DDD. Pt reports that he had a virtual visit with oncologist on 09/12/22. Pt reports that he was supposed to have had in person visit but was messed up with time change. Pt is now taking oral chemo along with infusions for for prostate cancer.   Socially: Pt has lived in [place]. Lives in one story duplex, has been here for over 14 years. Mom lives in [place]. Never been married. No kids. Not close to only sister.   Cognitive: Pt alert. Appears oriented but does have exceptional difficulties with completing thoughts and finding words at times. When asked questions, approx 80% of the time, pt will say, "um. Hmm. Well... repeat the question back.Marland Kitchen and then answer or say I am not sure."   Appetite: pt reports that eating could be better. Has groceries delivered. Tries to eat 3 meals per day. Reports that he tries to drink an ensure a day. Pt does not cook anymore. RN mentioned mobile meals to pt. He said he would think about it and let us know.   Mobility: greeted RN at the door in wheelchair. Pt reports that he still drives. Pt reports that he uses walker and cane when he leaves the house. Reports last fall was this past Thursday while he was outside to go to get labs drawn. Pt reports that he laid outside until neighbors found him and helped him up. RN talked to pt about emergency alert necklace/monitor. Pt said he is "just not sure about that".    Sleeping Pattern: Pt reports sleeping about 6 hours per night. Also says he naps off and on during day.   Pain: Pt denies pain at present. Reports that he takes scheduled pain medication- gabapentin 400 mg three times a day along with Tylenol as needed.  GI/GU: Denies any difficulty with using the bathroom. Continent with LBM yesterday.  Palliative Care/ Hospice: RN explained role and purpose of palliative care including visit frequency. Also discussed benefits of hospice care as well as the differences between the two with patient.   Goals of Care: pt wants to remain in his home and independent.    CODE STATUS: DNR- (noted in Epic) ADVANCED DIRECTIVES: N MOST FORM: No PPS:   Next appt scheduled: April 18- 1100am       PHYSICAL EXAM:   VITALS: Today's Vitals   09/18/22 1125  BP: 112/62  Pulse: 96  Resp: 18  SpO2: 95%  PainSc: 3   PainLoc: Leg    LUNGS: diminished in the bases, no cough CARDIAC:  regular, no JVD EXTREMITIES: WNL, no edema    Jacqulyn Cane, RN

## 2022-09-22 ENCOUNTER — Encounter (HOSPITAL_COMMUNITY): Payer: Self-pay | Admitting: Internal Medicine

## 2022-09-22 ENCOUNTER — Other Ambulatory Visit (HOSPITAL_COMMUNITY): Payer: Self-pay

## 2022-09-22 DIAGNOSIS — R4182 Altered mental status, unspecified: Secondary | ICD-10-CM | POA: Diagnosis not present

## 2022-09-22 DIAGNOSIS — D649 Anemia, unspecified: Secondary | ICD-10-CM

## 2022-09-22 DIAGNOSIS — J9601 Acute respiratory failure with hypoxia: Secondary | ICD-10-CM | POA: Diagnosis not present

## 2022-09-22 DIAGNOSIS — Z79899 Other long term (current) drug therapy: Secondary | ICD-10-CM

## 2022-09-22 DIAGNOSIS — Z515 Encounter for palliative care: Secondary | ICD-10-CM | POA: Diagnosis not present

## 2022-09-22 DIAGNOSIS — K922 Gastrointestinal hemorrhage, unspecified: Secondary | ICD-10-CM | POA: Diagnosis not present

## 2022-09-22 DIAGNOSIS — C61 Malignant neoplasm of prostate: Secondary | ICD-10-CM | POA: Diagnosis not present

## 2022-09-22 DIAGNOSIS — Z7189 Other specified counseling: Secondary | ICD-10-CM

## 2022-09-22 DIAGNOSIS — R0602 Shortness of breath: Secondary | ICD-10-CM

## 2022-09-22 DIAGNOSIS — F419 Anxiety disorder, unspecified: Secondary | ICD-10-CM

## 2022-09-22 DIAGNOSIS — G9341 Metabolic encephalopathy: Secondary | ICD-10-CM | POA: Diagnosis not present

## 2022-09-22 DIAGNOSIS — Z66 Do not resuscitate: Secondary | ICD-10-CM

## 2022-09-22 DIAGNOSIS — D500 Iron deficiency anemia secondary to blood loss (chronic): Secondary | ICD-10-CM | POA: Diagnosis not present

## 2022-09-22 DIAGNOSIS — D696 Thrombocytopenia, unspecified: Secondary | ICD-10-CM | POA: Diagnosis not present

## 2022-09-22 LAB — COMPREHENSIVE METABOLIC PANEL
ALT: 17 U/L (ref 0–44)
AST: 50 U/L — ABNORMAL HIGH (ref 15–41)
Albumin: 3.3 g/dL — ABNORMAL LOW (ref 3.5–5.0)
Alkaline Phosphatase: 200 U/L — ABNORMAL HIGH (ref 38–126)
Anion gap: 10 (ref 5–15)
BUN: 18 mg/dL (ref 8–23)
CO2: 19 mmol/L — ABNORMAL LOW (ref 22–32)
Calcium: 8.7 mg/dL — ABNORMAL LOW (ref 8.9–10.3)
Chloride: 113 mmol/L — ABNORMAL HIGH (ref 98–111)
Creatinine, Ser: 0.74 mg/dL (ref 0.61–1.24)
GFR, Estimated: >60 mL/min (ref >60)
Glucose, Bld: 131 mg/dL — ABNORMAL HIGH (ref 70–99)
Potassium: 4.2 mmol/L (ref 3.5–5.1)
Sodium: 142 mmol/L (ref 135–145)
Total Bilirubin: 1.3 mg/dL — ABNORMAL HIGH (ref 0.3–1.2)
Total Protein: 5.6 g/dL — ABNORMAL LOW (ref 6.5–8.1)

## 2022-09-22 LAB — BLOOD GAS, ARTERIAL
Acid-base deficit: 8 mmol/L — ABNORMAL HIGH (ref 0.0–2.0)
Bicarbonate: 18.9 mmol/L — ABNORMAL LOW (ref 20.0–28.0)
Drawn by: 422461
O2 Content: 8 L/min
O2 Saturation: 98.5 %
Patient temperature: 36.2
pCO2 arterial: 42 mmHg (ref 32–48)
pH, Arterial: 7.25 — ABNORMAL LOW (ref 7.35–7.45)
pO2, Arterial: 87 mmHg (ref 83–108)

## 2022-09-22 LAB — BLOOD GAS, VENOUS
Acid-base deficit: 8.4 mmol/L — ABNORMAL HIGH (ref 0.0–2.0)
Bicarbonate: 17.2 mmol/L — ABNORMAL LOW (ref 20.0–28.0)
Drawn by: 35529
O2 Saturation: 85.2 %
Patient temperature: 36.7
pCO2, Ven: 34 mmHg — ABNORMAL LOW (ref 44–60)
pH, Ven: 7.3 (ref 7.25–7.43)
pO2, Ven: 55 mmHg — ABNORMAL HIGH (ref 32–45)

## 2022-09-22 LAB — CBC
HCT: 26.9 % — ABNORMAL LOW (ref 39.0–52.0)
Hemoglobin: 8.2 g/dL — ABNORMAL LOW (ref 13.0–17.0)
MCH: 30.7 pg (ref 26.0–34.0)
MCHC: 30.5 g/dL (ref 30.0–36.0)
MCV: 100.7 fL — ABNORMAL HIGH (ref 80.0–100.0)
Platelets: 51 10*3/uL — ABNORMAL LOW (ref 150–400)
RBC: 2.67 MIL/uL — ABNORMAL LOW (ref 4.22–5.81)
RDW: 18.4 % — ABNORMAL HIGH (ref 11.5–15.5)
WBC: 36.2 10*3/uL — ABNORMAL HIGH (ref 4.0–10.5)
nRBC: 3 % — ABNORMAL HIGH (ref 0.0–0.2)

## 2022-09-22 LAB — BPAM RBC
Blood Product Expiration Date: 202405012359
ISSUE DATE / TIME: 202403280235
Unit Type and Rh: 600

## 2022-09-22 LAB — TYPE AND SCREEN
ABO/RH(D): A NEG
Antibody Screen: NEGATIVE
Unit division: 0

## 2022-09-22 LAB — MAGNESIUM: Magnesium: 2.2 mg/dL (ref 1.7–2.4)

## 2022-09-22 SURGERY — EGD (ESOPHAGOGASTRODUODENOSCOPY)
Anesthesia: Monitor Anesthesia Care

## 2022-09-22 MED ORDER — GLYCOPYRROLATE 0.2 MG/ML IJ SOLN: 0.2000 mg | INTRAMUSCULAR | Status: DC | PRN

## 2022-09-22 MED ORDER — HALOPERIDOL LACTATE 5 MG/ML IJ SOLN: 2.0000 mg | INTRAMUSCULAR | Status: DC | PRN

## 2022-09-22 MED ORDER — POLYVINYL ALCOHOL 1.4 % OP SOLN: 1.0000 [drp] | Freq: Four times a day (QID) | OPHTHALMIC | Status: DC | PRN

## 2022-09-22 MED ORDER — MORPHINE 100MG IN NS 100ML (1MG/ML) PREMIX INFUSION
5.0000 mg/h | INTRAVENOUS | Status: DC
  Administered 2022-09-22: 5 mg/h via INTRAVENOUS
  Filled 2022-09-22: qty 100

## 2022-09-22 MED ORDER — FUROSEMIDE 10 MG/ML IJ SOLN
20.0000 mg | Freq: Once | INTRAMUSCULAR | Status: AC
  Administered 2022-09-22: 20 mg via INTRAVENOUS
  Filled 2022-09-22: qty 2

## 2022-09-22 MED ORDER — BIOTENE DRY MOUTH MT LIQD: 15.0000 mL | OROMUCOSAL | Status: DC | PRN

## 2022-09-22 MED ORDER — MORPHINE BOLUS VIA INFUSION
5.0000 mg | INTRAVENOUS | Status: DC | PRN
  Administered 2022-09-22 (×2): 5 mg via INTRAVENOUS

## 2022-09-22 MED ORDER — LORAZEPAM 2 MG/ML IJ SOLN: 1.0000 mg | INTRAMUSCULAR | Status: DC | PRN

## 2022-09-24 LAB — PREPARE PLATELET PHERESIS: Unit division: 0

## 2022-09-24 LAB — BPAM PLATELET PHERESIS
Blood Product Expiration Date: 202403312359
ISSUE DATE / TIME: 202403290618
Unit Type and Rh: 5100

## 2022-09-25 LAB — CULTURE, BLOOD (ROUTINE X 2)
Culture: NO GROWTH
Culture: NO GROWTH
Special Requests: ADEQUATE
Special Requests: ADEQUATE

## 2022-09-25 NOTE — Death Summary Note (Signed)
DEATH SUMMARY   Patient Details  Name: Patrick [name] MRN: [id number] DOB: [birth year] [id number], Patrick Palau, MD Admission/Discharge Information   Admit Date:  23-Sep-2022  Date of Death: Date of Death: September 25, 2022  Time of Death: Time of Death: October 06, 1555  Length of Stay: 1   Principle Cause of death: Stage IV metastatic prostate cancer  Hospital Diagnoses: Principal Problem:   Acute respiratory failure with hypoxia ([place]) Active Problems:   Acute on chronic anemia   Thrombocytopenia ([facility])   AMS (altered mental status)   Upper GI bleed   Pneumonia   Acute metabolic encephalopathy   Hypertension   HIV infection ([place])   Prostate cancer ([place])   Anemia   DNR (do not resuscitate)   High risk medication use   Shortness of breath   Anxiety   End of life care   Goals of care, counseling/discussion   Palliative care encounter   Hospital Course: Patient is a unfortunate 72 year old gentleman history of stage IV prostate cancer with diffuse bone metastases on chemotherapy, anemia, thrombocytopenia, HIV, hypertension, hyperlipidemia presented with generalized weakness, altered mental status after a welfare check was called and did not present for scheduled chemotherapy. Patient noted to have dried blood in his mouth and dark stool in his underwear. Patient seen in the ED noted to be tachycardic, significant leukocytosis, anemic with hemoglobin dropping as low as 6.1. Patient transfused a unit of packed red blood cells. Chest x-ray was done concerning for possible bilateral interstitial opacities concerning for infiltrate. Head CT done with small bilateral extra-axial collections measuring up to 4 mm on the right and 3 mm on the left, possible subacute hematomas or diffuse dural thickening. No significant mass effect noted. ED physician spoke with neurosurgery who felt patient was a poor surgical candidate and did not recommend platelet transfusion at this time. Patient given IV fluids. Admitted  to the stepdown unit. Placed empirically on IV antibiotics, lactulose ordered. GI consultation placed.  Patient also subsequently received blood transfusions, went into respiratory distress, seen by PCCM, patient noted to be a DNR.  Palliative care consulted who met with family and decision made to transition to full comfort measures.  Patient subsequently died at 73 hrs. on 2022-09-25.  Assessment and Plan: #1 acute respiratory failure with hypoxia -Patient noted to go into acute respiratory failure with hypoxia the evening of 09/21/2022  with increased use of accessory muscles of respiration, lethargy.  Repeat chest x-ray done with concern for interval development of right middle lobe and lingular airspace opacities, retrocardiac airspace opacities findings suggestive of infection/inflammation, left pleural effusion not excluded. -Patient with increased use of accessory muscles of respiration. -Patient noted to have received transfusion of 1 unit PRBCs as well as a unit of platelets and concern for also possible volume overload and as such given Lasix 20 mg IV x 1. -Patient seen in consultation by PCCM, flu given patient's overall poor prognosis and advancing metastatic cancer despite therapy seems minimal intervention is in the best interest of the patient including avoiding any aggressive therapies such as intubation. -Patient noted to have a DNR order form on chart from 09/05/2022 and per PCCM do not believe patient will survive intubation and mechanical ventilation even in the short-term to facilitate the procedure and in agreement with deferment of GI procedure for now. -Palliative care consulted who met with family and decision made to transition to full comfort measures.   -Patient subsequently died at 73 hrs. on 2022/09/25.  May his soul rest  in peace.     #2 acute metabolic encephalopathy -??  Etiology.  Patient noted to be very confused on admission, with no focal neurological  deficit. -Patient noted to have elevated ammonia level and given some lactulose enema in the ED with some clinical improvement. -Patient alert and oriented to self place and time however with some slow responses. -CT head done with small bilateral extra-axial collections measuring up to 4 mm on the right 3 mm on the left, possible subacute hematomas or diffuse dural thickening.  No significant mass effect.  Diffusely increased calvarium density consistent with patient's history of osseous metastatic disease. -Per admitting physician ED spoke with neurosurgery, Dr. Arnoldo Morale who felt patient was a poor surgical candidate did not feel platelet transfusion was indicated. -MRI brain ordered and attempted on 09/21/2022 however patient unable to lay still for procedure and as such was canceled.  -Patient maintained empirically on IV antibiotics for concern for pneumonia. -Status post transfusion 1 unit packed red blood cells. -Status post lactulose on presentation currently on oral lactulose twice daily.   -Patient noted to have gone in acute respiratory distress and continued to deteriorate.   -Palliative care consulted and patient transition to full comfort measures.   -Patient subsequently died at 73 hrs. on 10/05/22.    3.  Concern for UGIB/acute on chronic blood loss anemia -Patient noted to have dried blood in his mouth and noted to have dark stool in his underwear on presentation. -FOBT negative. -Abdominal exam unremarkable. -Hemoglobin noted on admission was 7.3 from 9.0 (08/11/2022). -Hemoglobin noted on redraw at 6.1 the morning of 09/21/2022.. -Status post transfusion 1 unit packed red blood cells hemoglobin at 8.8 posttransfusion.  -Patient placed on IV PPI every 12 hours during the hospitalization.  -GI consulted who assessed the patient and patient was for upper endoscopy the morning of 10-05-2022 however patient noted to go into acute respiratory distress, deteriorated and as such  procedure was canceled and GI recommended palliative care at this time.   -Patient seen in consultation by palliative care, who met with family and decision made to transition to full comfort measures.   -Patient subsequently died at 40 hrs. on 2022/10/05.    4.  Probable pneumonia -Chest x-ray done on admission with prominent bilateral interstitial opacities could represent bronchovascular crowding, no definite focal airspace opacity noted. -Patient with a significant leukocytosis of 26 K on admission which are further risen to 41.3 K (09/21/2022). -MRSA PCR negative. -Urine Legionella antigen pending, urine strep pneumococcus antigen pending.  -Blood cultures pending.  -Patient noted to go into acute respiratory distress the evening of 09/21/2022,, repeat chest x-ray done with low lung volumes, interval development of right middle lobe and lingular airspace opacities, question retrocardiac airspace opacity findings suggestive of infection/inflammation, left pleural effusion not excluded.  -Patient was maintained on empiric IV antibiotics, patient seen by critical care.   -Patient's condition deteriorated, palliative care consulted and patient was transition to full comfort measures.    5.  Acute on chronic thrombocytopenia -In the setting of prostate cancer with bone mets. -Over the past 2 weeks platelet count noted to be between 50-60 K. -Platelet count at 36 K on 09/21/2022. -EDP discussed with neurosurgery, Dr. Arnoldo Morale who did not feel platelet transfusion indicated at this time. -Patient also with concerns for upper GI bleed.  FOBT negative. -Transfused 1 unit of platelets in anticipation of possible GI procedure. -Patient's condition deteriorated, palliative care consulted who met with family and decision made to transition  to full comfort measures.   -Patient subsequently died at 8 hrs. on 10/02/22.     6.  HIV -CD4 count 480 (09/13/2022). -Patient's antiviral medications were held  during the hospitalization.   -Patient deteriorated during the hospitalization, palliative care consulted and patient transition to full comfort measures.    7.  Hypertension Blood noted to be soft on presentation and as such antihypertensive medications held throughout the hospitalization.   -Patient's condition deteriorated he was made for comfort measures by palliative care who met with family.     8.  Stage IV prostate cancer with diffuse bony mets -Due to rapidly worsening anemia, thrombocytopenia likely from bone mets patient was started on cabazitaxel on 09/05/2022.  During last oncology office visit on 09/12/2022 he was given referral for palliative home care. -Oncology informed of admission. -Patient with worsening respiratory status requiring as high as 9 L high flow nasal cannula with sats in the mid 90s.  Patient noted with use of accessory muscles of respiration. -Patient deteriorating and likely heading towards end-of-life. -Palliative care consulted, assessed patient met with family and decision made to transition to full comfort measures.  Patient subsequently was made comfortable and diet at 1557 hrs. on 10/02/22.  May his soul rest in peace.           Procedures:  Transfused 1 unit packed red blood cells 09/21/2022 1 unit platelet transfusion 10/02/2022 Chest x-ray 09/21/2022, 08/27/2022  Consultations:  Palliative care: Dr. Vinetta Bergamo 10/02/2022 PCCM: Dr. Patsey Berthold Oct 02, 2022 Gastroenterology: Dr. Lyndel Safe 09/21/2022  The results of significant diagnostics from this hospitalization (including imaging, microbiology, ancillary and laboratory) are listed below for reference.   Significant Diagnostic Studies: DG Chest Port 1 View  Result Date: 09/21/2022 CLINICAL DATA:  141880 SOB (shortness of breath) 141880 EXAM: PORTABLE CHEST 1 VIEW.  Patient is rotated. COMPARISON:  Chest x-ray 09/10/2022 FINDINGS: The heart and mediastinal contours are grossly unchanged. Low lung volumes.  Interval development of right middle lobe and lingular airspace opacities. Question retrocardiac airspace opacity. No pulmonary edema. Left pleural effusion not excluded. No pneumothorax. No acute osseous abnormality.  Old healed left rib fractures. IMPRESSION: 1. Low lung volumes and patient rotation. Recommend repeat PA and lateral view of the chest with improved inspiratory effort. 2. Interval development of right middle lobe and lingular airspace opacities. Question retrocardiac airspace opacity. Findings suggestive of infection/inflammation. 3. Left pleural effusion not excluded. Electronically Signed   By: Iven Finn M.D.   On: 09/21/2022 23:22   MR BRAIN WO CONTRAST  Result Date: 09/21/2022 CLINICAL DATA:  Altered mental status.  History of prostate cancer. EXAM: MRI HEAD WITHOUT CONTRAST TECHNIQUE: Multiplanar, multiecho pulse sequences of the brain and surrounding structures were obtained without intravenous contrast. COMPARISON:  Head CT 09/17/2022.  MRI brain 07/01/2021. FINDINGS: Nondiagnostic study secondary to severe patient motion artifact. Redemonstrated extra-axial collections versus diffuse dural thickening. IMPRESSION: Nondiagnostic study secondary to severe patient motion artifact. Redemonstrated small extra-axial collections versus diffuse dural thickening. Electronically Signed   By: Emmit Alexanders M.D.   On: 09/21/2022 12:14   CT Head Wo Contrast  Result Date: 09/12/2022 CLINICAL DATA:  Altered mental status EXAM: CT HEAD WITHOUT CONTRAST TECHNIQUE: Contiguous axial images were obtained from the base of the skull through the vertex without intravenous contrast. RADIATION DOSE REDUCTION: This exam was performed according to the departmental dose-optimization program which includes automated exposure control, adjustment of the mA and/or kV according to patient size and/or use of iterative reconstruction technique. COMPARISON:  No  prior CT head available, correlation is made with  07/01/2021 MRI head FINDINGS: Evaluation is somewhat limited by motion artifact. Brain: Small bilateral extra-axial collections, which are isodense to the cortex, overlying the bilateral frontoparietal convexities, these measure up to 4 mm on the right and 3 mm on the left. No significant mass effect. No midline shift. No sulcal effacement. No acute infarct, hemorrhage, or mass. No hydrocephalus. No evidence of acute infarction, hemorrhage, mass, mass effect, or midline shift. No hydrocephalus or extra-axial fluid collection. Vascular: No hyperdense vessel. Skull: Diffusely increased calvarial density, without focal lesion, consistent with the patient's history of osseous metastatic disease. Negative for fracture. Sinuses/Orbits: Complete opacification of the right frontal and sphenoid sinuses, with near complete opacification of the left sphenoid sinus and right ethmoid air cells. Mucosal thickening in the left ethmoid air cells. No acute finding in the orbits. Other: Fluid in the bilateral mastoid air cells and pneumatized petrous apices. IMPRESSION: 1. Small bilateral extra-axial collections, which measure up to 4 mm on the right and 3 mm on the left, possibly subacute hematomas or diffuse dural thickening. No significant mass effect. 2. Diffusely increased calvarial density, consistent with the patient's history of osseous metastatic disease. These results were called by telephone at the time of interpretation on 08/25/2022 at 6:36 pm to provider [facility], who verbally acknowledged these results. Electronically Signed   By: Merilyn Baba M.D.   On: 09/14/2022 18:36   DG Chest Port 1 View  Result Date: 09/19/2022 CLINICAL DATA:  GI bleed EXAM: PORTABLE CHEST 1 VIEW COMPARISON:  PET CT 08/25/22 FINDINGS: Low lung volumes. No pleural effusion. No pneumothorax. Likely normal cardiac and mediastinal contours when accounting low lung volumes. Prominent bilateral interstitial opacities could represent  bronchovascular crowding no definite focal airspace opacity. Visualized upper abdomen is unremarkable. Diffusely sclerotic appearance of the skeletal structures is likely related to the presence of diffuse osseous metastatic disease. Are multiple healing left-sided rib fractures. IMPRESSION: Prominent bilateral interstitial opacities could represent bronchovascular crowding. No definite focal airspace opacity. Low lung volumes. Electronically Signed   By: Marin Roberts M.D.   On: 09/18/2022 14:50   NM PET (PSMA) SKULL TO MID THIGH  Result Date: 08/25/2022 CLINICAL DATA:  Prostate cancer with widespread skeletal metastasis. Increasing PSA. Chemotherapy completed 08/03/2021 EXAM: NUCLEAR MEDICINE PET SKULL BASE TO THIGH TECHNIQUE: 9.7 mCi F18 Piflufolastat (Pylarify) was injected intravenously. Full-ring PET imaging was performed from the skull base to thigh after the radiotracer. CT data was obtained and used for attenuation correction and anatomic localization. COMPARISON:  PSMA PET scan 03/23/2022 FINDINGS: NECK No radiotracer activity in neck lymph nodes. Incidental CT finding: None. CHEST No radiotracer accumulation within mediastinal or hilar lymph nodes. No suspicious pulmonary nodules on the CT scan. 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 ABDOMEN/PELVIS Prostate: No focal activity the prostate gland. Lymph nodes: No abnormal radiotracer accumulation within pelvic or abdominal nodes. Liver: There is a new hypo dense lesion in the dome the RIGHT hepatic lobe measuring 14 mm (image 109/series 4). There appears to be subtle liver activity above background activity associated with this lesion (image 109 of the PET data set.) Incidental CT finding: None. SKELETON Widespread intensely radiotracer avid skeletal metastasis again noted. Nearly every bone in the axillary appendicular skeleton is involved. There is some increased  confluence within the sacrum compared to prior SUV max equal 7.3 on image 179.) increase in radiotracer activity in the posterior LEFT  and RIGHT iliac bones additionally (image 180). Widespread involvement of the spine ribs in scapular unchanged. On the CT portion exam there is underlying diffuse sclerotic change. IMPRESSION: 1. New subtle lesion in the dome of the RIGHT hepatic lobe is concerning for prostate cancer liver metastasis. This could be confirmed with contrast MRI if clinically relevant. 2. Mild progression previously demonstrated widespread sclerotic radiotracer avid skeletal metastasis. No interval improvement from prior PSMA PET scan. 3. No metastatic lymphadenopathy identified. No pulmonary metastasis. Electronically Signed   By: Suzy Bouchard M.D.   On: 08/25/2022 16:40   NM NO REPORT/NO CHARGE  Result Date: 08/25/2022 There is no Radiologist interpretation  for this exam.   Microbiology: Recent Results (from the past 240 hour(s))  Culture, blood (routine x 2)     Status: None (Preliminary result)   Collection Time: 09/13/2022  4:11 PM   Specimen: BLOOD RIGHT ARM  Result Value Ref Range Status   Specimen Description   Final    BLOOD RIGHT ARM Performed at [facility], [place] [address], [place], [place] [address]    Special Requests   Final    BOTTLES DRAWN AEROBIC AND ANAEROBIC Blood Culture adequate volume Performed at [place] [address] [place], [place] [address]    Culture   Final    NO GROWTH 2 DAYS Performed at [facility] [address], [place], [place] [address]    Report Status PENDING  Incomplete  Culture, blood (routine x 2)     Status: None (Preliminary result)   Collection Time: 09/04/2022  4:11 PM   Specimen: BLOOD LEFT ARM  Result Value Ref Range Status   Specimen Description   Final    BLOOD LEFT ARM Performed at [facility], [place] [address] [place], [place] [address]    Special  Requests   Final    BOTTLES DRAWN AEROBIC AND ANAEROBIC Blood Culture adequate volume Performed at [place] [address], [place], [place] [address]    Culture   Final    NO GROWTH 2 DAYS Performed at [facility] [address] [place], [place] [address]    Report Status PENDING  Incomplete  MRSA Next Gen by PCR, Nasal     Status: None   Collection Time: 09/21/22  2:36 AM   Specimen: Nasal Mucosa; Nasal Swab  Result Value Ref Range Status   MRSA by PCR Next Gen NOT DETECTED NOT DETECTED Final    Comment: (NOTE) The GeneXpert MRSA Assay (FDA approved for NASAL specimens only), is one component of a comprehensive MRSA colonization surveillance program. It is not intended to diagnose MRSA infection nor to guide or monitor treatment for MRSA infections. Test performance is not FDA approved in patients less than 72 years old. Performed at [facility], [place] [address], [place], [place] [address]     Time spent: 45 minutes  Signed: Irine Seal, MD Oct 10, 2022

## 2022-09-25 NOTE — Progress Notes (Signed)
PCCM Interval Note  I have reviewed the chart, discussed case with Dr. Grandville Silos. On review of the patient's chart, direction of care is clear that he was interested in pursuing comfort based therapies.  DNR/DNI order form is on the chart from 09/05/2022.  I do not believe he would survive intubation and mechanical ventilation even short-term to facilitate a procedure.  Note that his GI procedure has been deferred, agree with that decision.  Patient is apparently quite private and has not shared information about his medical conditions with family.  Based on his previously stated wishes I favor clarifying his CODE STATUS to DNR/DNI, involving palliative care to strongly consider a transition to full comfort and palliation.  Appreciate their input.  I will place the full DNR orders.  Critical care time 30 minutes   Baltazar Apo, MD, PhD 2022/10/18, 9:59 AM [facility] [phone] or if no answer before 7:00PM call [phone] For any issues after 7:00PM please call eLink [phone]

## 2022-09-25 NOTE — Progress Notes (Signed)
RT NOTE:  Pt placed on BiPAP per MD order. 

## 2022-09-25 NOTE — Progress Notes (Signed)
Chaplain met with Cuyler's mother and provided support as she processed how quickly he declined.  He had been fairly private about his health and so she only recently found out he had cancer.  She cared for her husband as he was dying in their home.  Her daughter is in [place] and has been a support through her recent transition to [place] believes that her daughter will come to help her through this as well.  She is a long time member at [employer] and has been in touch with the priest there.  They plan to use [facility] and [facility] home.  She had a friend who was helping her with some of the logistics.  Chaplain provided listening through life review and provided prayer at [facility] request.  Chaplain Janne Napoleon, [place] Pager, [phone]

## 2022-09-25 NOTE — H&P (Signed)
NAME:  Patrick [name], MRN:  [id number], DOB:  1951/06/14, LOS: 1 ADMISSION DATE:  09/21/2022, CONSULTATION DATE:  2022/10/20 REFERRING MD:  hospitalist, CHIEF COMPLAINT:  tachypnea    History of Present Illness:  72 yo man with  hx of prostate Ca, anemia, thrombocytopenia, HIV, HTN, HLD, presented to ED with weakness, AMS.  Welfare check done after he did not show up for chemotherapy.   Found to be incontinent of dark stool, appearance of old blood orally.  In ED, noted to be altered, tachycardic, with elevated WBC, anemia. Tx 1 unit prbc.  Antibiotics started  Started lactulose as well.   CXR: infiltrates CT head Small b extra axial collections (3 and 4 mm)   Reviewed recent Oncology note, progression of disease, anemia and thrombocytopenia,   "due to rapid worsening anemia and thrombocytopenia from bone mets, I started him on cabazitaxel on 09/05/22.  -Patient is overall doing poorly at home, more fatigued, dyspneic, not able to do all self-care.  Strongly recommend palliative home care, finally agreed. "  Palliative care did start seeing patient at home.  X 2.   Per their notes, only has estranged sister and elderly mother who lives in assisted living, no other potential decision makers.     Pertinent  Medical History   Metaststic prostate Ca, anemia, thrombocytopenia, HIV, HTN, HLD,   Significant Hospital Events: Including procedures, antibiotic start and stop dates in addition to other pertinent events     Interim History / Subjective:   Objective   Blood pressure 128/74, pulse (!) 110, temperature (!) 97.1 F (36.2 C), temperature source Axillary, resp. rate (!) 41, height 5\' 10"  (1.778 m), weight 69.7 kg, SpO2 95 %.        Intake/Output Summary (Last 24 hours) at 10/20/22 0437 Last data filed at 2022-10-20 0000 Gross per 24 hour  Intake 3061.99 ml  Output 900 ml  Net 2161.99 ml   Filed Weights   08/28/2022 1417 09/21/22 0230  Weight: 75 kg 69.7 kg     Examination: General: delirium, alert, slightly agitated. HENT: dried blood, dry mucous membranes  Lungs: ctab  Cardiovascular: mild sinus tach Abdomen: obese, nd, nt Extremities:  no edema  Neuro: disoriented, non verbal  Minimal UOP in condom cath collection bag   Resolved Hospital Problem list    Assessment & Plan:  Metastatic prostate ca.  PNA vanc and cefipime leukocytosis GIB: started PPI BID  Thrombocytopenia transfusion in process.   HIV   Given overall poor prognosis and advancing metastatic cancer despite therapy, recent initiation of palliative care at home, it seems minimal intervention is in the best interest of this patient, including avoiding any aggressive therapies such as intubation.  Unclear to me if "DNR" documented in palliative care notes indicates DNI as well, but this makes the most sense given his poor prognosis and initiation of home hospice, seems like this would fit most with overall goals of care.   At this time patient is alert enough to protect airway, although remains tachycardic and intermittently somewhat tachypneic.  I expect given PNA, sepsis, thrombocytopenia and anemia, death could be expected in hours to days.  Palliative approach to treatment would be the most reasonable and appears to be most aligned with his prior goals of care.   Discussed with NP Raenette Rover.   Day time [facility] team will remain primary physicians given possiblity of transition to full comfort care today.  We will remain available if that cannot happen in  a timely fashion or if critical care is otherwise needed.    Best Practice (right click and "Reselect all SmartList Selections" daily)    Labs   CBC: Recent Labs  Lab 08/29/2022 1431 09/21/2022 2255 09/11/2022 2304 09/21/22 0727  WBC 26.0* 24.7*  --  41.3*  NEUTROABS 17.7* 20.7*  --   --   HGB 7.3* 6.9* 6.1* 8.8*  HCT 23.2* 21.6* 18.0* 27.6*  MCV 98.7 98.2  --  96.8  PLT 35* 31*  --  36*    Basic Metabolic  Panel: Recent Labs  Lab 09/01/2022 1431 09/19/2022 1611 08/25/2022 2304 09/21/22 0727  NA 140  --  139 141  K 4.2  --  4.0 3.8  CL 107  --  107 111  CO2 22  --   --  18*  GLUCOSE 98  --  90 120*  BUN 17  --  13 15  CREATININE 0.82  --  0.70 0.79  CALCIUM 8.9  --   --  8.9  MG  --  1.7  --   --    GFR: Estimated Creatinine Clearance: 82.3 mL/min (by C-G formula based on SCr of 0.79 mg/dL). Recent Labs  Lab 09/21/2022 1431 09/15/2022 1611 08/30/2022 2255 09/21/22 0727  PROCALCITON  --   --   --  0.52  WBC 26.0*  --  24.7* 41.3*  LATICACIDVEN  --  1.3  --   --     Liver Function Tests: Recent Labs  Lab 09/04/2022 1431 09/21/22 0727  AST 71* 73*  ALT 19 19  ALKPHOS 202* 208*  BILITOT 0.9 1.2  PROT 5.9* 5.9*  ALBUMIN 3.7 3.7   No results for input(s): "LIPASE", "AMYLASE" in the last 168 hours. Recent Labs  Lab 09/21/2022 1431 09/21/22 0727  AMMONIA 60* 48*    ABG    Component Value Date/Time   HCO3 17.2 (L) 09/21/2022 2344   TCO2 20 (L) 09/03/2022 2304   ACIDBASEDEF 8.4 (H) 09/21/2022 2344   O2SAT 85.2 09/21/2022 2344     Coagulation Profile: Recent Labs  Lab 08/28/2022 1431  INR 1.3*    Cardiac Enzymes: Recent Labs  Lab 08/25/2022 1611  CKTOTAL 75    HbA1C: Hgb A1c MFr Bld  Date/Time Value Ref Range Status  06/30/2021 03:52 PM 5.6 4.8 - 5.6 % Final    Comment:    (NOTE) Pre diabetes:          5.7%-6.4%  Diabetes:              >6.4%  Glycemic control for   <7.0% adults with diabetes     CBG: Recent Labs  Lab 08/28/2022 1425  GLUCAP 105*    Review of Systems:   Unable to assess   Past Medical History:  He,  has a past medical history of Allergy, Arthritis, Cancer ([place]), H/O inguinal hernia repair, History of tobacco use, HIV infection ([place]), Hyperlipidemia, Hypertension, and Strabismic amblyopia.   Surgical History:   Past Surgical History:  Procedure Laterality Date   EYE SURGERY     strabismus   HERNIA REPAIR     IR BONE TUMOR(S)RF  ABLATION  10/18/2021   IR BONE TUMOR(S)RF ABLATION  10/18/2021   IR KYPHO EA ADDL LEVEL THORACIC OR LUMBAR  10/18/2021   IR KYPHO LUMBAR INC FX REDUCE BONE BX UNI/BIL CANNULATION INC/IMAGING  10/18/2021     Social History:   reports that he has quit smoking. He quit smokeless tobacco use about 26 years  ago.  His smokeless tobacco use included chew. He reports current alcohol use of about 1.0 standard drink of alcohol per week. He reports that he does not use drugs.   Family History:  His family history includes Colon polyps in his mother; Diabetes in his maternal grandmother; Heart disease in his father and mother; Kidney disease in his father; Lung cancer in his father. There is no history of Colon cancer.   Allergies No Known Allergies   Home Medications  Prior to Admission medications   Medication Sig Start Date End Date Taking? Authorizing Provider  albuterol (VENTOLIN HFA) 108 (90 Base) MCG/ACT inhaler Inhale 2 puffs into the lungs every 6 (six) hours as needed for shortness of breath or wheezing. 07/27/21  Yes [provider]  amLODipine (NORVASC) 5 MG tablet Take 1 tablet (5 mg total) by mouth daily. 03/01/22 03/01/23 Yes [name], MD  atorvastatin (LIPITOR) 40 MG tablet Take 1 tablet (40 mg total) by mouth daily. 09/04/2022  Yes [name], MD  calcium citrate-vitamin D (CITRACAL+D) 315-200 MG-UNIT tablet Take 1 tablet by mouth daily.   Yes [provider]  emtricitabine-rilpivir-tenofovir AF (ODEFSEY) 200-25-25 MG TABS tablet Take 1 tablet by mouth daily. 09/14/22  Yes [name], MD  gabapentin (NEURONTIN) 300 MG capsule TAKE 1 CAPSULE(300 MG) BY MOUTH THREE TIMES DAILY 08/23/22  Yes Alla Feeling, NP  Multiple Vitamins-Minerals (MULTIVITAMIN WITH MINERALS) tablet Take 1 tablet by mouth daily.   Yes [provider]  oxyCODONE (OXY IR/ROXICODONE) 5 MG immediate release tablet Take 1 tablet (5 mg total) by mouth every 6 (six) hours as needed for severe  pain. 08/11/22  Yes Truitt Merle, MD  predniSONE (DELTASONE) 10 MG tablet Take 1 tablet (10 mg total) by mouth daily. 09/02/22  Yes Truitt Merle, MD  celecoxib (CELEBREX) 200 MG capsule Take 1 capsule (200 mg total) by mouth 2 (two) times daily. Patient not taking: Reported on 09/12/2022 01/19/22   Truitt Merle, MD  lidocaine (LIDODERM) 5 % Place 1 patch onto the skin daily. Remove & Discard patch within 12 hours or as directed by MD Patient not taking: Reported on 09/02/2022 07/06/21   Dorethea Clan, DO     Critical care time: 35 min

## 2022-09-25 NOTE — Consult Note (Signed)
Consultation Note Date: 10-22-22   Patient Name: Patrick [name]  DOB: 1950-10-20  MRN: [id number]  Age / Sex: 72 y.o., male   PCP: [name], MD Referring Physician: Eugenie Filler, MD  Reason for Consultation: Establishing goals of care and Symptom Management     Chief Complaint/History of Present Illness:   Patient is a 72 year old male with a past medical history of stage IV prostate cancer with diffuse bony metastasis on chemotherapy, anemia, thrombocytopenia, HIV, hypertension, and hyperlipidemia who was admitted on 09/12/2022 for management of generalized weakness and altered mental status after welfare check was called when he did not present for scheduled chemotherapy.  Upon admission patient was noted to have dried blood in his mouth and dark stool in his underwear.  Imaging showed possible concerns for bilateral interstitial opacities/infiltrate on chest x-ray.  Head CT showed small bilateral extra-axial collections measuring up to 4 mm on the right and 3 mm on the left possibly subacute hematomas.  Since admission patient has received medical management for acute hypoxic respiratory failure in setting of likely infection on chest x-ray.  Unknown etiology of acute metabolic encephalopathy.  GI was consulted for concern of UGIB though patient has been too ill to undergo further workup for this.  Palliative medicine team consulted to assist with complex medical decision-making.  Discussed care with hospitalist and bedside RN prior to seeing patient.  Patient only has 2 contacts/next of kin listed in EMR, patient's mother who is 54 and in a living facility and his sister who noted she was estranged from patient.  ------------------------------------------------------------------------------------------------------------- Advance Care Planning Conversation  Pertinent diagnosis: Stage IV metastatic prostate cancer, thrombocytopenia, possible GI bleed, encephalopathy, likely  pneumonia  The patient and/or family consented to a voluntary [place]. Individuals present for the conversation: Patient unable to participate in complex medical decision-making due to medical status.  Spoke with patient's mother and sister over the phone individually.  Palliative medicine provider present for entirety of conversations.  Summary of the conversation:  Presented to bedside to check on patient.  Patient currently on BiPAP for worsening work of breathing.  Patient is confused and not able to communicate.  Patient noted to be tachycardic and incredibly tachypneic.  Patient very frail and appears to be in pain as laying in fetal position.  Unable to engage patient in complex medical decision-making so informed him would reach out to his mother and sister.  Able to nurse call patient's mother and then call patient's sister.  Introduced myself and the role of the palliative medicine team.  Discussed patient's medical care at this time.  Expressed concern that patient is reaching the end of his life and is suffering from high symptom burden due to his underlying medical conditions.  Both mother and sister agreed with transition to comfort focused care at this time.  Mother is trying to get a ride to come in from her assisted living facility as soon as able.  All questions answered at that time.  Provided emotional support via active listening.  Outcome of the conversations and/or documents completed:  Transition to comfort focused care at this time.  I spent 25 minutes providing separately identifiable ACP services with the patient and/or surrogate decision maker in a voluntary, in-person conversation discussing the patient's wishes and goals as detailed in the above note.  Chelsea Aus, DO Palliative Care Provider  -------------------------------------------------------------------------------------------------------------   Updated care team regarding transition  to comfort focused care on this time.  Will discontinue BiPAP and start continuous opioid infusions to assist with dyspnea management.  Primary Diagnoses  Present on Admission:  AMS (altered mental status)  Thrombocytopenia ([place])   Palliative Review of Systems: Increased work of breathing noted, grimacing  Past Medical History:  Diagnosis Date   Allergy    seasonal   Arthritis    r knee   Cancer ([place])    H/O inguinal hernia repair    History of tobacco use    HIV infection ([place])    Hyperlipidemia    Hypertension    Strabismic amblyopia    Social History   Socioeconomic History   Marital status: Single    Spouse name: Not on file   Number of children: Not on file   Years of education: Not on file   Highest education level: Not on file  Occupational History   Not on file  Tobacco Use   Smoking status: Former   Smokeless tobacco: Former    Types: Chew    Quit date: 1998  Vaping Use   Vaping Use: Never used  Substance and Sexual Activity   Alcohol use: Yes    Alcohol/week: 1.0 standard drink of alcohol    Types: 1 Cans of beer per week    Comment: occ   Drug use: No   Sexual activity: Never    Comment: pt. declined condoms  Other Topics Concern   Not on file  Social History Narrative   Not on file   Social Determinants of Health   Financial Resource Strain: Not on file  Food Insecurity: Not on file  Transportation Needs: Not on file  Physical Activity: Not on file  Stress: Not on file  Social Connections: Not on file   Family History  Problem Relation Age of Onset   Colon polyps Mother    Heart disease Mother    Heart disease Father    Kidney disease Father    Lung cancer Father    Diabetes Maternal Grandmother    Colon cancer Neg Hx    Scheduled Meds:  Chlorhexidine Gluconate Cloth  6 each Topical Daily   mouth rinse  15 mL Mouth Rinse 4 times per day   Continuous Infusions:  morphine 5 mg/hr (September 23, 2022 1106)   PRN Meds:.antiseptic oral  rinse, glycopyrrolate, haloperidol lactate, LORazepam, morphine, mouth rinse, polyvinyl alcohol No Known Allergies CBC:    Component Value Date/Time   WBC 36.2 (H) 09-23-2022 0959   HGB 8.2 (L) 23-Sep-2022 0959   HGB 7.9 (L) 09/13/2022 1611   HCT 26.9 (L) 09/23/2022 0959   HCT 23.5 (L) 09/13/2022 1611   PLT 51 (L) 2022-09-23 0959   PLT 64 (LL) 09/13/2022 1611   MCV 100.7 (H) 09-23-22 0959   MCV 93 09/13/2022 1611   NEUTROABS 20.7 (H) 08/28/2022 2255   LYMPHSABS 2.5 09/24/2022 2255   MONOABS 1.0 08/27/2022 2255   EOSABS 0.2 09/01/2022 2255   BASOSABS 0.0 09/01/2022 2255   Comprehensive Metabolic Panel:    Component Value Date/Time   NA 142 2022/09/23 0959   NA 138 09/13/2022 1611   K 4.2 09-23-2022 0959   CL 113 (H) 09-23-2022 0959   CO2 19 (L) 2022-09-23 0959   BUN 18 09/23/2022 0959   BUN 37 (H) 09/13/2022 1611   CREATININE 0.74 09-23-2022 0959   CREATININE 0.90 09/08/2022 1322   CREATININE 0.79 09/03/2020 0849   GLUCOSE 131 (H) September 23, 2022 0959   CALCIUM 8.7 (L) 09-23-2022 0959   AST 50 (H) 2022/09/23  0959   AST 84 (H) 09/08/2022 1322   ALT 17 24-Sep-2022 0959   ALT 19 09/08/2022 1322   ALKPHOS 200 (H) 2022/09/24 0959   BILITOT 1.3 (H) 2022/09/24 0959   BILITOT 0.5 09/13/2022 1611   BILITOT 0.6 09/08/2022 1322   PROT 5.6 (L) Sep 24, 2022 0959   PROT 6.0 09/13/2022 1611   ALBUMIN 3.3 (L) 2022/09/24 0959   ALBUMIN 4.1 09/13/2022 1611    Physical Exam: Vital Signs: BP (!) 114/47   Pulse (!) 109   Temp (!) 97.2 F (36.2 C) (Axillary)   Resp (!) 31   Ht 5\' 10"  (1.778 m)   Wt 69.7 kg   SpO2 96%   BMI 22.05 kg/m  SpO2: SpO2: 96 % O2 Device: O2 Device: Bi-PAP O2 Flow Rate: O2 Flow Rate (L/min): 10 L/min Intake/output summary:  Intake/Output Summary (Last 24 hours) at September 24, 2022 1122 Last data filed at 2022-09-24 [id number] Gross per 24 hour  Intake 3385.24 ml  Output 900 ml  Net 2485.24 ml   LBM: Last BM Date : 09/21/22 Baseline Weight: Weight: 75 kg Most  recent weight: Weight: 69.7 kg  General: In bed in fetal position, frail, cachectic, confused and agitated Eyes: Excessive dried rheum present on both eyes HENT: dry mucous membranes blood staining lips Cardiovascular: RRR, no edema in LE b/l Respiratory: increased work of breathing noted with accessory muscle use Abdomen: not distended Extremities: Muscle wasting present in all extremities Skin: Legs cool to touch bilaterally Neuro: Unable to follow commands or participate in complex medical decision making Psych: Agitated          Palliative Performance Scale: 10%              Additional Data Reviewed: Recent Labs    09/21/22 0727 September 24, 2022 0959  WBC 41.3* 36.2*  HGB 8.8* 8.2*  PLT 36* 51*  NA 141 142  BUN 15 18  CREATININE 0.79 0.74    Imaging: DG Chest Port 1 View CLINICAL DATA:  141880 SOB (shortness of breath) [id number]  EXAM: PORTABLE CHEST 1 VIEW.  Patient is rotated.  COMPARISON:  Chest x-ray 09/21/2022  FINDINGS: The heart and mediastinal contours are grossly unchanged.  Low lung volumes. Interval development of right middle lobe and lingular airspace opacities. Question retrocardiac airspace opacity. No pulmonary edema. Left pleural effusion not excluded. No pneumothorax.  No acute osseous abnormality.  Old healed left rib fractures.  IMPRESSION: 1. Low lung volumes and patient rotation. Recommend repeat PA and lateral view of the chest with improved inspiratory effort. 2. Interval development of right middle lobe and lingular airspace opacities. Question retrocardiac airspace opacity. Findings suggestive of infection/inflammation. 3. Left pleural effusion not excluded.  Electronically Signed   By: Iven Finn M.D.   On: 09/21/2022 23:22 MR BRAIN WO CONTRAST CLINICAL DATA:  Altered mental status.  History of prostate cancer.  EXAM: MRI HEAD WITHOUT CONTRAST  TECHNIQUE: Multiplanar, multiecho pulse sequences of the brain and  surrounding structures were obtained without intravenous contrast.  COMPARISON:  Head CT 09/15/2022.  MRI brain 07/01/2021.  FINDINGS: Nondiagnostic study secondary to severe patient motion artifact. Redemonstrated extra-axial collections versus diffuse dural thickening.  IMPRESSION: Nondiagnostic study secondary to severe patient motion artifact. Redemonstrated small extra-axial collections versus diffuse dural thickening.  Electronically Signed   By: Emmit Alexanders M.D.   On: 09/21/2022 12:14    I personally reviewed recent imaging.   Palliative Care Assessment and Plan Summary of Established Goals of Care and Medical Treatment Preferences  Patient is a 72 year old male with a past medical history of stage IV prostate cancer with diffuse bony metastasis on chemotherapy, anemia, thrombocytopenia, HIV, hypertension, and hyperlipidemia who was admitted on 08/29/2022 for management of generalized weakness and altered mental status after welfare check was called when he did not present for scheduled chemotherapy.  Upon admission patient was noted to have dried blood in his mouth and dark stool in his underwear.  Imaging showed possible concerns for bilateral interstitial opacities/infiltrate on chest x-ray.  Head CT showed small bilateral extra-axial collections measuring up to 4 mm on the right and 3 mm on the left possibly subacute hematomas.  Since admission patient has received medical management for acute hypoxic respiratory failure in setting of likely infection on chest x-ray.  Unknown etiology of acute metabolic encephalopathy.  GI was consulted for concern of UGIB though patient has been too ill to undergo further workup for this.  Palliative medicine team consulted to assist with complex medical decision-making.  # Complex medical decision making/goals of care  -Patient unable to participate in medical decision making secondary to his mental status.  -Spoke with patient's NOK in  EMR including his mother and sister.  Discussed medical pathways for care moving forward.  At this time mother and sister agreeing with transition to comfort focused care at the end of life.  -At this time we will discontinue interventions that are no longer focused on comfort such as IV fluids, imaging, or lab work.  Will instead focus on symptom management of pain, dyspnea, and agitation in the setting of end-of-life care.  - Code Status: DNR   # Symptom management    -Pain/Dyspnea, acute in the setting of end-of-life care                               -Started IV morphine continuous basal infusion at 5mg /hr.  Have ordered RN bolus of 5mg  q35mins prn. Continue to adjust based on symptom burden.                   -Anxiety/agitation, in the setting of end-of-life care                               -Started IV Ativan 1 mg every 4 hours as needed. Continue to adjust based on patient's symptom burden.                                 -And also has IV Haldol 2 mg every 4 hours as needed. Continue to adjust based on patient's symptom burden.                   -Secretions, in the setting of end-of-life care                               -Started IV glycopyrrolate 0.2 mg every 4 hours as needed.  # Discharge Planning: Transition to comfort focused care at this time.  Anticipated in hospital death versus if patient stabilizes to get transported to inpatient hospice.  Thank you for allowing the palliative care team to participate in the care [name].  Chelsea Aus, DO Palliative Care Provider PMT # [phone]  If patient remains symptomatic despite maximum doses,  please call PMT at [phone] between 0700 and 1900. Outside of these hours, please call attending, as PMT does not have night coverage.  *Please note that this is a verbal dictation therefore any spelling or grammatical errors are due to the "[employer]" system interpretation.

## 2022-09-25 NOTE — Progress Notes (Signed)
    Patient Name: JARRAH DESSER           DOB: 1950-07-20  MRN: [id number]      Admission Date: 09/08/2022  Attending Provider: Eugenie Filler, MD  Primary Diagnosis: AMS (altered mental status)   Level of care: Stepdown    CROSS COVER NOTE   Date of Service   September 26, 2022   ABOUBACAR REICHNER III, 72 y.o. male, was admitted on 09/02/2022 for AMS (altered mental status).    HPI/Events of Note   2200-bedside RN reports that patient is removing oxygen equipment, O2 saturation dropping.  Respiratory rate significantly increases with agitation. Patient is also attempting to get out of bed multiple times.    Earlier, patient received Ativan which seemed to help with agitation.  Additional Ativan dose has been ordered.  RN may trial safety mitts to avoid patient from removing O2 equipment.  If this fails, will consider restraint use.    2300-despite Ativan use, patient remains intermittently agitated.  Will hold off on further sedatives.  Currently requiring increase O2 to maintain >92%. Clear breath sounds. VBG, chest x-ray, neb treatments ordered.  Patient is currently DNR only. Unable to contact patient's mother at this time.   Spoke with sister, Cephas Darby, via phone regarding patient's current care and CODE STATUS.  Sister does not feel comfortable making medical decisions for the patient as they have been estranged for many years.  She is unsure if he would have wanted her to make any medical decisions for him.  His mother is in her 42s and lives in a nursing home.  Per sister, mother will be visiting today.    0500-spoke with Dr. Patsey Berthold (critical care) regarding patient's respiratory decline.  Currently protecting airway, however remains tachypneic, tachycardic,  labored, and is requiring in O2.  Interventions/ Plan   Ativan Restraints VBG, chest x-ray, nebs ABG        Raenette Rover, DNP, [place]

## 2022-09-25 NOTE — Progress Notes (Signed)
PROGRESS NOTE    Patrick [name]  [id number] DOB: 1951/03/16 DOA: 09/21/2022 PCP: [name], MD    Chief Complaint  Patient presents with   Rectal Bleeding    Brief Narrative:  Patient is a unfortunate 72 year old gentleman history of stage IV prostate cancer with diffuse bone metastases on chemotherapy, anemia, thrombocytopenia, HIV, hypertension, hyperlipidemia presented with generalized weakness, altered mental status after a welfare check was called and did not present for scheduled chemotherapy.  Patient noted to have dried blood in his mouth and dark stool in his underwear.  Patient seen in the ED noted to be tachycardic, significant leukocytosis, anemic with hemoglobin dropping as low as 6.1.  Patient transfused a unit of packed red blood cells.  Chest x-ray was done concerning for possible bilateral interstitial opacities concerning for infiltrate.  Head CT done with small bilateral extra-axial collections measuring up to 4 mm on the right and 3 mm on the left, possible subacute hematomas or diffuse dural thickening.  No significant mass effect noted.  ED physician spoke with neurosurgery who felt patient was a poor surgical candidate and did not recommend platelet transfusion at this time.  Patient given IV fluids.  Admitted to the stepdown unit.  Placed empirically on IV antibiotics, lactulose ordered.  GI consultation placed.   Assessment & Plan:   Principal Problem:   Acute respiratory failure with hypoxia (HCC) Active Problems:   Acute on chronic anemia   Thrombocytopenia (HCC)   AMS (altered mental status)   Upper GI bleed   Pneumonia   Acute metabolic encephalopathy   Hypertension   HIV infection ([place])   Prostate cancer (HCC)   Anemia  #1 acute respiratory failure with hypoxia -Patient noted to go into acute respiratory failure with hypoxia overnight/early this morning, with increased use of accessory muscles of respiration, lethargy.  Repeat chest x-ray  done with concern for interval development of right middle lobe and lingular airspace opacities, retrocardiac airspace opacities findings suggestive of infection/inflammation, left pleural effusion not excluded. -Patient with increased use of accessory muscles of respiration. -Patient noted to have received transfusion of 1 unit PRBCs as well as a unit of platelets and concern for also possible volume overload and as such we will give Lasix 20 mg IV x 1 and monitor urine output. -Patient seen in consultation by PCCM, flu given patient's overall poor prognosis and advancing metastatic cancer despite therapy seems minimal intervention is in the best interest of the patient including avoiding any aggressive therapies such as intubation. -Patient noted to have a DNR order form on chart from 09/05/2022 and per PCCM do not believe patient will survive intubation and mechanical ventilation even in the short-term to facilitate the procedure and in agreement with deferment of GI procedure for now. -Will place on BiPAP for now to help with comfort and palliation as patient with increased use of accessory muscles of respiration and acute respiratory distress pending palliative care assessment for goals of care and possible transition to full comfort. -Supportive care.  #2 acute metabolic encephalopathy -??  Etiology.  Patient noted to be very confused on admission, with no focal neurological deficit. -Patient noted to have elevated ammonia level and given some lactulose enema in the ED with some clinical improvement. -Patient alert and oriented to self place and time however with some slow responses. -CT head done with small bilateral extra-axial collections measuring up to 4 mm on the right 3 mm on the left, possible subacute hematomas or diffuse dural  thickening.  No significant mass effect.  Diffusely increased calvarium density consistent with patient's history of osseous metastatic disease. -Per admitting  physician ED spoke with neurosurgery, Dr. Arnoldo Morale who felt patient was a poor surgical candidate did not feel platelet transfusion was indicated. -MRI brain ordered and attempted on 09/21/2022 however patient unable to lay still for procedure and as such was canceled.  -Continue neurochecks.  Fall precautions. -Continue empiric IV antibiotics for concern for pneumonia. -Status post transfusion 1 unit packed red blood cells. -Status post lactulose on presentation currently on oral lactulose twice daily.   -Patient noted to have grown in acute respiratory distress earlier this morning, currently lethargic with use of accessory muscles of respiration.  -Supportive care.  3.  Concern for UGIB/acute on chronic blood loss anemia -Patient noted to have dried blood in his mouth and noted to have dark stool in his underwear on presentation. -FOBT negative. -Abdominal exam unremarkable. -Hemoglobin noted on admission was 7.3 from 9.0 (08/11/2022). -Hemoglobin noted on redraw at 6.1 the morning of 09/21/2022.. -Status post transfusion 1 unit packed red blood cells hemoglobin at 8.8 posttransfusion.  -Repeat CBC pending for this morning.  -Continue IV PPI every 12 hours. -GI consulted who assessed the patient and patient was for upper endoscopy this morning for further evaluation however patient currently in acute respiratory distress, procedure has subsequently been canceled pending palliative care evaluation. -Supportive care..  4.  Probable pneumonia -Chest x-ray done on admission with prominent bilateral interstitial opacities could represent bronchovascular crowding, no definite focal airspace opacity noted. -Patient with a significant leukocytosis of 26 K on admission which are further risen to 41.3 K (09/21/2022). -Repeat labs pending for this morning. -MRSA PCR negative. -Urine Legionella antigen pending, urine strep pneumococcus antigen pending.  -Blood cultures pending.  -Patient noted to go  into acute respiratory distress overnight, repeat chest x-ray done with low lung volumes, interval development of right middle lobe and lingular airspace opacities, question retrocardiac airspace opacity findings suggestive of infection/inflammation, left pleural effusion not excluded.  -Continue empiric IV vancomycin, IV cefepime.  -Supportive care.   5.  Acute on chronic thrombocytopenia -In the setting of prostate cancer with bone mets. -Over the past 2 weeks platelet count noted to be between 50-60 K. -Platelet count currently at 36 K on 09/21/2022. -EDP discussed with neurosurgery, Dr. Arnoldo Morale who did not feel platelet transfusion indicated at this time. -Patient also with concerns for upper GI bleed.  FOBT negative. -Transfused 1 unit of platelets early this morning in anticipation of possible GI procedure. -CBC pending. -Follow platelet count and transfuse for platelet count < 10 or if actively bleeding. -Patient's primary oncologist out of town, case discussed with oncologist on-call, Dr. Benay Spice, over the phone on 09/21/2022.  6.  HIV -CD4 count 480 (09/13/2022). -Once tolerating oral intake and no longer n.p.o. we will resume home oral regiment.  7.  Hypertension Blood noted to be soft on presentation and as such antihypertensive medications on hold.   8.  Stage IV prostate cancer with diffuse bony mets -Due to rapidly worsening anemia, thrombocytopenia likely from bone mets patient was started on cabazitaxel on 09/05/2022.  During last oncology office visit on 09/12/2022 he was given referral for palliative home care. -Oncology informed of admission. -Patient with worsening respiratory status overnight/early this morning, currently on 9 L high flow nasal cannula with sats in the mid 90s.  Patient with use of accessory muscles of respiration. -Patient deteriorating and likely heading towards end-of-life. -Palliative care  consult pending.   DVT prophylaxis: SCDs Code Status:  DNR Family Communication: Updated patient.  No family at bedside. Disposition: TBD  Status is: Inpatient The patient will require care spanning > 2 midnights and should be moved to inpatient because: Severity of illness   Consultants:  Palliative care pending PCCM: Dr. Patsey Berthold 10-14-22 Gastroenterology: Dr. Lyndel Safe 09/21/2022  Procedures: Transfused 1 unit packed red blood cells 09/21/2022 1 unit platelet transfusion 10-14-22 Chest x-ray 09/21/2022, 09/19/2022  Antimicrobials:  IV vancomycin 09/08/2022>>>> IV cefepime 08/31/2022>>>>   Subjective: Events overnight noted.  Patient noted to go into acute respiratory distress, use of accessory muscles of respiration.  Laying in bed.  Drowsy.  Opens eyes to verbal stimuli but drifts back off to sleep.  Somewhat lethargic.  Use of accessory muscles of respiration.  On 9 L high flow O2 with sats of 97%.  Per RN yesterday patient with brown stool noted.  MRI attempted yesterday however patient unable to lay still.  Objective: Vitals:   October 14, 2022 0800 2022-10-14 0843 Oct 14, 2022 0905 October 14, 2022 0948  BP: 119/62  (!) 114/47   Pulse: (!) 104  (!) 107 (!) 109  Resp: (!) 25  (!) 29 (!) 31  Temp:  (!) 97.4 F (36.3 C) (!) 97.2 F (36.2 C)   TempSrc:  Axillary Axillary   SpO2: 97%  93% 96%  Weight:      Height:        Intake/Output Summary (Last 24 hours) at 10/14/22 1016 Last data filed at 10-14-2022 0905 Gross per 24 hour  Intake 3385.24 ml  Output 900 ml  Net 2485.24 ml   Filed Weights   09/01/2022 1417 09/21/22 0230  Weight: 75 kg 69.7 kg    Examination:  General exam: On 9 L high flow nasal cannula.  Use of accessory muscles of respiration.  Pallor.  Some dried blood on lips.  Respiratory system: CTAB anterior lung fields.  No wheezes.  No crackles.  Use of accessory muscles of respiration.   Cardiovascular system: Tachycardic.  No JVD.  No lower extremity edema.  Gastrointestinal system: Abdomen is soft, nontender, nondistended,  positive bowel sounds.  No rebound.  No guarding.   Central nervous system: Lethargic.  Moving extremities spontaneously.  No focal neurological deficits.  Extremities: Symmetric 5 x 5 power. Skin: No rashes, lesions or ulcers Psychiatry: Judgement and insight appear poor. Mood & affect appropriate.     Data Reviewed: I have personally reviewed following labs and imaging studies  CBC: Recent Labs  Lab 09/01/2022 1431 09/03/2022 2255 09/10/2022 2304 09/21/22 0727  WBC 26.0* 24.7*  --  41.3*  NEUTROABS 17.7* 20.7*  --   --   HGB 7.3* 6.9* 6.1* 8.8*  HCT 23.2* 21.6* 18.0* 27.6*  MCV 98.7 98.2  --  96.8  PLT 35* 31*  --  36*    Basic Metabolic Panel: Recent Labs  Lab 08/28/2022 1431 09/02/2022 1611 09/01/2022 2304 09/21/22 0727  NA 140  --  139 141  K 4.2  --  4.0 3.8  CL 107  --  107 111  CO2 22  --   --  18*  GLUCOSE 98  --  90 120*  BUN 17  --  13 15  CREATININE 0.82  --  0.70 0.79  CALCIUM 8.9  --   --  8.9  MG  --  1.7  --   --     GFR: Estimated Creatinine Clearance: 82.3 mL/min (by C-G formula based on SCr of 0.79  mg/dL).  Liver Function Tests: Recent Labs  Lab 09/19/2022 1431 09/21/22 0727  AST 71* 73*  ALT 19 19  ALKPHOS 202* 208*  BILITOT 0.9 1.2  PROT 5.9* 5.9*  ALBUMIN 3.7 3.7    CBG: Recent Labs  Lab 09/11/2022 1425  GLUCAP 105*     Recent Results (from the past 240 hour(s))  Culture, blood (routine x 2)     Status: None (Preliminary result)   Collection Time: 08/26/2022  4:11 PM   Specimen: BLOOD RIGHT ARM  Result Value Ref Range Status   Specimen Description   Final    BLOOD RIGHT ARM Performed at [facility] [address] [place], [place] [address]    Special Requests   Final    BOTTLES DRAWN AEROBIC AND ANAEROBIC Blood Culture adequate volume Performed at [facility] [address] [place], [place] [address]    Culture   Final    NO GROWTH 2 DAYS Performed at [facility] [address] [place], [place] [address]    Report Status PENDING  Incomplete  Culture, blood (routine x 2)     Status: None (Preliminary result)   Collection Time: 09/03/2022  4:11 PM   Specimen: BLOOD LEFT ARM  Result Value Ref Range Status   Specimen Description   Final    BLOOD LEFT ARM Performed at [facility], [place] [address] [place], [place] [address]    Special Requests   Final    BOTTLES DRAWN AEROBIC AND ANAEROBIC Blood Culture adequate volume Performed at [facility] [address], [place], [place] [address]    Culture   Final    NO GROWTH 2 DAYS Performed at [facility] [address] [place], [place] [address]    Report Status PENDING  Incomplete  MRSA Next Gen by PCR, Nasal     Status: None   Collection Time: 09/21/22  2:36 AM   Specimen: Nasal Mucosa; Nasal Swab  Result Value Ref Range Status   MRSA by PCR Next Gen NOT DETECTED NOT DETECTED Final    Comment: (NOTE) The GeneXpert MRSA Assay (FDA approved for NASAL specimens only), is one component of a comprehensive MRSA colonization surveillance program. It is not intended to diagnose MRSA infection nor to guide or monitor treatment for MRSA infections. Test performance is not FDA approved in patients less than 57 years old. Performed at [facility], [place] [address], [place], [place] [address]          Radiology Studies: [facility] Chest Port 1 View  Result Date: 09/21/2022 CLINICAL DATA:  [id number] SOB (shortness of breath) [id number] EXAM: PORTABLE CHEST 1 VIEW.  Patient is rotated. COMPARISON:  Chest x-ray 09/14/2022 FINDINGS: The heart and mediastinal contours are grossly unchanged. Low lung volumes. Interval development of right middle lobe and lingular airspace opacities. Question retrocardiac airspace opacity. No pulmonary edema. Left pleural effusion not excluded. No pneumothorax. No acute osseous abnormality.  Old healed left rib fractures. IMPRESSION: 1. Low  lung volumes and patient rotation. Recommend repeat PA and lateral view of the chest with improved inspiratory effort. 2. Interval development of right middle lobe and lingular airspace opacities. Question retrocardiac airspace opacity. Findings suggestive of infection/inflammation. 3. Left pleural effusion not excluded. Electronically Signed   By: Iven Finn M.D.   On: 09/21/2022 23:22   MR BRAIN WO CONTRAST  Result Date: 09/21/2022 CLINICAL DATA:  Altered mental status.  History  of prostate cancer. EXAM: MRI HEAD WITHOUT CONTRAST TECHNIQUE: Multiplanar, multiecho pulse sequences of the brain and surrounding structures were obtained without intravenous contrast. COMPARISON:  Head CT 09/02/2022.  MRI brain 07/01/2021. FINDINGS: Nondiagnostic study secondary to severe patient motion artifact. Redemonstrated extra-axial collections versus diffuse dural thickening. IMPRESSION: Nondiagnostic study secondary to severe patient motion artifact. Redemonstrated small extra-axial collections versus diffuse dural thickening. Electronically Signed   By: Emmit Alexanders M.D.   On: 09/21/2022 12:14   CT Head Wo Contrast  Result Date: 08/29/2022 CLINICAL DATA:  Altered mental status EXAM: CT HEAD WITHOUT CONTRAST TECHNIQUE: Contiguous axial images were obtained from the base of the skull through the vertex without intravenous contrast. RADIATION DOSE REDUCTION: This exam was performed according to the departmental dose-optimization program which includes automated exposure control, adjustment of the mA and/or kV according to patient size and/or use of iterative reconstruction technique. COMPARISON:  No prior CT head available, correlation is made with 07/01/2021 MRI head FINDINGS: Evaluation is somewhat limited by motion artifact. Brain: Small bilateral extra-axial collections, which are isodense to the cortex, overlying the bilateral frontoparietal convexities, these measure up to 4 mm on the right and 3 mm on the  left. No significant mass effect. No midline shift. No sulcal effacement. No acute infarct, hemorrhage, or mass. No hydrocephalus. No evidence of acute infarction, hemorrhage, mass, mass effect, or midline shift. No hydrocephalus or extra-axial fluid collection. Vascular: No hyperdense vessel. Skull: Diffusely increased calvarial density, without focal lesion, consistent with the patient's history of osseous metastatic disease. Negative for fracture. Sinuses/Orbits: Complete opacification of the right frontal and sphenoid sinuses, with near complete opacification of the left sphenoid sinus and right ethmoid air cells. Mucosal thickening in the left ethmoid air cells. No acute finding in the orbits. Other: Fluid in the bilateral mastoid air cells and pneumatized petrous apices. IMPRESSION: 1. Small bilateral extra-axial collections, which measure up to 4 mm on the right and 3 mm on the left, possibly subacute hematomas or diffuse dural thickening. No significant mass effect. 2. Diffusely increased calvarial density, consistent with the patient's history of osseous metastatic disease. These results were called by telephone at the time of interpretation on 09/12/2022 at 6:36 pm to provider [facility], who verbally acknowledged these results. Electronically Signed   By: Merilyn Baba M.D.   On: 08/25/2022 18:36   DG Chest Port 1 View  Result Date: 09/14/2022 CLINICAL DATA:  GI bleed EXAM: PORTABLE CHEST 1 VIEW COMPARISON:  PET CT 08/25/22 FINDINGS: Low lung volumes. No pleural effusion. No pneumothorax. Likely normal cardiac and mediastinal contours when accounting low lung volumes. Prominent bilateral interstitial opacities could represent bronchovascular crowding no definite focal airspace opacity. Visualized upper abdomen is unremarkable. Diffusely sclerotic appearance of the skeletal structures is likely related to the presence of diffuse osseous metastatic disease. Are multiple healing left-sided rib fractures.  IMPRESSION: Prominent bilateral interstitial opacities could represent bronchovascular crowding. No definite focal airspace opacity. Low lung volumes. Electronically Signed   By: Marin Roberts M.D.   On: 09/24/2022 14:50        Scheduled Meds:  sodium chloride   Intravenous Once   Chlorhexidine Gluconate Cloth  6 each Topical Daily   mouth rinse  15 mL Mouth Rinse 4 times per day   pantoprazole (PROTONIX) IV  40 mg Intravenous Q12H   Continuous Infusions:  sodium chloride 125 mL/hr at October 12, 2022 0800   ceFEPime (MAXIPIME) IV Stopped (10-12-2022 0610)   vancomycin 1,000 mg (10-12-2022 0949)  LOS: 1 day    Time spent: 50 minutes    Irine Seal, MD [facility]   To contact the attending provider between 7A-7P or the covering provider during after hours 7P-7A, please log into the web site [email or web address] and access using universal [place] password for that web site. If you do not have the password, please call the hospital operator.  October 15, 2022, 10:16 AM

## 2022-09-25 NOTE — Progress Notes (Signed)
Progress Note    ASSESSMENT AND PLAN:   Metastatic prostate CA ?GI bleed Respiratory failure d/t pneumonia HIV Severe thrombocytopenia  Plan: -Agree with comfort care -IV Protonix -Cancel EGD -Unfortunately, at times GI bleeding could be a terminal event. -Will sign off for now.       SUBJECTIVE   No obvious further GI bleeding Being considered for palliative/comfort care Blood in oral cavity likely from ulcers in the palate Worsening oxygen requirement  OBJECTIVE:     Vital signs in last 24 hours: Temp:  [96.3 F (35.7 C)-98.9 F (37.2 C)] 97.2 F (36.2 C) Oct 02, 2022 0905) Pulse Rate:  [40-116] 107 2022-10-02 0905) Resp:  [18-41] 29 10/02/22 0905) BP: (92-149)/(47-125) 114/47 2022/10/02 0905) SpO2:  [67 %-100 %] 93 % 2022/10/02 0905) Last BM Date : 09/21/22 General:   Agitated, on 5 L oxygen. Abdomen:  Soft, nondistended, nontender.  Normal bowel sounds,.          Intake/Output from previous day: 03/28 0701 - 10-02-22 0700 In: 3130.4 [I.V.:2439; IV Piggyback:691.4] Out: 900 [Urine:900] Intake/Output this shift: Total I/O In: 504.9 [I.V.:194.9; Blood:310] Out: -   Lab Results: Recent Labs    08/26/2022 1431 08/27/2022 2255 09/13/2022 2304 09/21/22 0727  WBC 26.0* 24.7*  --  41.3*  HGB 7.3* 6.9* 6.1* 8.8*  HCT 23.2* 21.6* 18.0* 27.6*  PLT 35* 31*  --  36*   BMET Recent Labs    09/04/2022 1431 09/14/2022 2304 09/21/22 0727  NA 140 139 141  K 4.2 4.0 3.8  CL 107 107 111  CO2 22  --  18*  GLUCOSE 98 90 120*  BUN 17 13 15   CREATININE 0.82 0.70 0.79  CALCIUM 8.9  --  8.9   LFT Recent Labs    09/21/22 0727  PROT 5.9*  ALBUMIN 3.7  AST 73*  ALT 19  ALKPHOS 208*  BILITOT 1.2   PT/INR Recent Labs    09/07/2022 1431  LABPROT 16.0*  INR 1.3*   Hepatitis Panel No results for input(s): "HEPBSAG", "HCVAB", "HEPAIGM", "HEPBIGM" in the last 72 hours.  DG Chest Port 1 View  Result Date: 09/21/2022 CLINICAL DATA:  141880 SOB (shortness of breath) 141880  EXAM: PORTABLE CHEST 1 VIEW.  Patient is rotated. COMPARISON:  Chest x-ray 08/31/2022 FINDINGS: The heart and mediastinal contours are grossly unchanged. Low lung volumes. Interval development of right middle lobe and lingular airspace opacities. Question retrocardiac airspace opacity. No pulmonary edema. Left pleural effusion not excluded. No pneumothorax. No acute osseous abnormality.  Old healed left rib fractures. IMPRESSION: 1. Low lung volumes and patient rotation. Recommend repeat PA and lateral view of the chest with improved inspiratory effort. 2. Interval development of right middle lobe and lingular airspace opacities. Question retrocardiac airspace opacity. Findings suggestive of infection/inflammation. 3. Left pleural effusion not excluded. Electronically Signed   By: Iven Finn M.D.   On: 09/21/2022 23:22   MR BRAIN WO CONTRAST  Result Date: 09/21/2022 CLINICAL DATA:  Altered mental status.  History of prostate cancer. EXAM: MRI HEAD WITHOUT CONTRAST TECHNIQUE: Multiplanar, multiecho pulse sequences of the brain and surrounding structures were obtained without intravenous contrast. COMPARISON:  Head CT 09/03/2022.  MRI brain 07/01/2021. FINDINGS: Nondiagnostic study secondary to severe patient motion artifact. Redemonstrated extra-axial collections versus diffuse dural thickening. IMPRESSION: Nondiagnostic study secondary to severe patient motion artifact. Redemonstrated small extra-axial collections versus diffuse dural thickening. Electronically Signed   By: Emmit Alexanders M.D.   On: 09/21/2022 12:14   CT  Head Wo Contrast  Result Date: 08/28/2022 CLINICAL DATA:  Altered mental status EXAM: CT HEAD WITHOUT CONTRAST TECHNIQUE: Contiguous axial images were obtained from the base of the skull through the vertex without intravenous contrast. RADIATION DOSE REDUCTION: This exam was performed according to the departmental dose-optimization program which includes automated exposure control,  adjustment of the mA and/or kV according to patient size and/or use of iterative reconstruction technique. COMPARISON:  No prior CT head available, correlation is made with 07/01/2021 MRI head FINDINGS: Evaluation is somewhat limited by motion artifact. Brain: Small bilateral extra-axial collections, which are isodense to the cortex, overlying the bilateral frontoparietal convexities, these measure up to 4 mm on the right and 3 mm on the left. No significant mass effect. No midline shift. No sulcal effacement. No acute infarct, hemorrhage, or mass. No hydrocephalus. No evidence of acute infarction, hemorrhage, mass, mass effect, or midline shift. No hydrocephalus or extra-axial fluid collection. Vascular: No hyperdense vessel. Skull: Diffusely increased calvarial density, without focal lesion, consistent with the patient's history of osseous metastatic disease. Negative for fracture. Sinuses/Orbits: Complete opacification of the right frontal and sphenoid sinuses, with near complete opacification of the left sphenoid sinus and right ethmoid air cells. Mucosal thickening in the left ethmoid air cells. No acute finding in the orbits. Other: Fluid in the bilateral mastoid air cells and pneumatized petrous apices. IMPRESSION: 1. Small bilateral extra-axial collections, which measure up to 4 mm on the right and 3 mm on the left, possibly subacute hematomas or diffuse dural thickening. No significant mass effect. 2. Diffusely increased calvarial density, consistent with the patient's history of osseous metastatic disease. These results were called by telephone at the time of interpretation on 09/02/2022 at 6:36 pm to provider [facility], who verbally acknowledged these results. Electronically Signed   By: Merilyn Baba M.D.   On: 09/01/2022 18:36   DG Chest Port 1 View  Result Date: 09/19/2022 CLINICAL DATA:  GI bleed EXAM: PORTABLE CHEST 1 VIEW COMPARISON:  PET CT 08/25/22 FINDINGS: Low lung volumes. No pleural  effusion. No pneumothorax. Likely normal cardiac and mediastinal contours when accounting low lung volumes. Prominent bilateral interstitial opacities could represent bronchovascular crowding no definite focal airspace opacity. Visualized upper abdomen is unremarkable. Diffusely sclerotic appearance of the skeletal structures is likely related to the presence of diffuse osseous metastatic disease. Are multiple healing left-sided rib fractures. IMPRESSION: Prominent bilateral interstitial opacities could represent bronchovascular crowding. No definite focal airspace opacity. Low lung volumes. Electronically Signed   By: Marin Roberts M.D.   On: 09/16/2022 14:50     Principal Problem:   Acute respiratory failure with hypoxia (HCC) Active Problems:   Acute on chronic anemia   Thrombocytopenia (HCC)   AMS (altered mental status)   Upper GI bleed   Pneumonia   Acute metabolic encephalopathy   Hypertension   HIV infection ([place])   Prostate cancer ([place])     LOS: 1 day     Carmell Austria, MD 10-19-22, 9:42 AM [facility] [phone]

## 2022-09-25 NOTE — Progress Notes (Signed)
  Daily Progress Note   Patient Name: Patrick [name]       Date: 10/10/2022 DOB: March 07, 1951  Age: 72 y.o. MRN#: [id number] Attending Physician: Eugenie Filler, MD Primary Care Physician: [name], MD Admit Date: 09/03/2022 Length of Stay: 1 day  Will place full consult note as soon as able. Patient seen and care discussed with medical providers today. Patient is lethargic and unable to engage in conversation. Discussed care with patient's mother and sister who are both listed as NOK in EMR.  Patient is now being transitioned to comfort focused care at the end of life. Will change orders accordingly.   Chelsea Aus, DO Palliative Care Provider PMT # [phone]

## 2022-09-25 NOTE — Anesthesia Preprocedure Evaluation (Deleted)
Anesthesia Evaluation    Airway        Dental   Pulmonary pneumonia, former smoker Hypoxia Acidosis Tachypnea          Cardiovascular hypertension, Pt. 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     Plan Discussed with:   Anesthesia Plan Comments:          Anesthesia Quick Evaluation

## 2022-09-25 DEATH — deceased

## 2022-09-26 ENCOUNTER — Inpatient Hospital Stay: Payer: PPO | Admitting: Nurse Practitioner

## 2022-09-26 ENCOUNTER — Inpatient Hospital Stay: Payer: PPO

## 2022-09-26 ENCOUNTER — Other Ambulatory Visit (HOSPITAL_COMMUNITY): Payer: PPO

## 2022-09-27 ENCOUNTER — Other Ambulatory Visit (HOSPITAL_COMMUNITY): Payer: PPO

## 2022-09-28 ENCOUNTER — Inpatient Hospital Stay: Payer: PPO

## 2022-10-03 ENCOUNTER — Encounter: Payer: PPO | Admitting: Infectious Diseases

## 2022-10-03 ENCOUNTER — Ambulatory Visit: Payer: PPO | Admitting: Hematology

## 2022-10-03 ENCOUNTER — Ambulatory Visit: Payer: PPO

## 2022-10-03 ENCOUNTER — Other Ambulatory Visit: Payer: PPO

## 2022-10-05 ENCOUNTER — Ambulatory Visit: Payer: PPO

## 2022-10-12 ENCOUNTER — Other Ambulatory Visit (HOSPITAL_COMMUNITY): Payer: Self-pay

## 2022-10-12 ENCOUNTER — Other Ambulatory Visit: Payer: PPO

## 2023-01-24 NOTE — Progress Notes (Signed)
This encounter was created in error - please disregard.
